# Patient Record
Sex: Female | Born: 1960
Health system: Southern US, Community
[De-identification: ages and names within clinical notes are randomized; demographics above are authoritative.]

## PROBLEM LIST (undated history)

## (undated) DIAGNOSIS — T7840XA Allergy, unspecified, initial encounter: Secondary | ICD-10-CM

## (undated) DIAGNOSIS — F419 Anxiety disorder, unspecified: Secondary | ICD-10-CM

## (undated) DIAGNOSIS — E119 Type 2 diabetes mellitus without complications: Secondary | ICD-10-CM

## (undated) DIAGNOSIS — K589 Irritable bowel syndrome without diarrhea: Secondary | ICD-10-CM

## (undated) DIAGNOSIS — R569 Unspecified convulsions: Secondary | ICD-10-CM

## (undated) DIAGNOSIS — G8929 Other chronic pain: Secondary | ICD-10-CM

## (undated) DIAGNOSIS — G4730 Sleep apnea, unspecified: Secondary | ICD-10-CM

## (undated) DIAGNOSIS — I10 Essential (primary) hypertension: Secondary | ICD-10-CM

## (undated) DIAGNOSIS — R519 Headache, unspecified: Secondary | ICD-10-CM

## (undated) DIAGNOSIS — K635 Polyp of colon: Secondary | ICD-10-CM

## (undated) DIAGNOSIS — K222 Esophageal obstruction: Secondary | ICD-10-CM

## (undated) DIAGNOSIS — E785 Hyperlipidemia, unspecified: Secondary | ICD-10-CM

## (undated) DIAGNOSIS — M1990 Unspecified osteoarthritis, unspecified site: Secondary | ICD-10-CM

## (undated) DIAGNOSIS — Q0700 Arnold-Chiari syndrome without spina bifida or hydrocephalus: Secondary | ICD-10-CM

## (undated) DIAGNOSIS — R51 Headache: Secondary | ICD-10-CM

## (undated) DIAGNOSIS — K5790 Diverticulosis of intestine, part unspecified, without perforation or abscess without bleeding: Secondary | ICD-10-CM

## (undated) DIAGNOSIS — F32A Depression, unspecified: Secondary | ICD-10-CM

## (undated) DIAGNOSIS — R7303 Prediabetes: Secondary | ICD-10-CM

## (undated) DIAGNOSIS — K219 Gastro-esophageal reflux disease without esophagitis: Secondary | ICD-10-CM

## (undated) DIAGNOSIS — F329 Major depressive disorder, single episode, unspecified: Secondary | ICD-10-CM

## (undated) HISTORY — DX: Hyperlipidemia, unspecified: E78.5

## (undated) HISTORY — DX: Unspecified convulsions: R56.9

## (undated) HISTORY — PX: POLYPECTOMY: SHX149

## (undated) HISTORY — DX: Allergy, unspecified, initial encounter: T78.40XA

## (undated) HISTORY — DX: Polyp of colon: K63.5

## (undated) HISTORY — DX: Prediabetes: R73.03

## (undated) HISTORY — PX: COLONOSCOPY: SHX174

## (undated) HISTORY — DX: Essential (primary) hypertension: I10

## (undated) HISTORY — DX: Unspecified osteoarthritis, unspecified site: M19.90

## (undated) HISTORY — DX: Arnold-Chiari syndrome without spina bifida or hydrocephalus: Q07.00

## (undated) HISTORY — DX: Headache, unspecified: R51.9

## (undated) HISTORY — PX: OTHER SURGICAL HISTORY: SHX169

## (undated) HISTORY — PX: TUBAL LIGATION: SHX77

## (undated) HISTORY — DX: Esophageal obstruction: K22.2

## (undated) HISTORY — PX: TOTAL ABDOMINAL HYSTERECTOMY: SHX209

## (undated) HISTORY — PX: BREAST BIOPSY: SHX20

## (undated) HISTORY — DX: Gastro-esophageal reflux disease without esophagitis: K21.9

## (undated) HISTORY — DX: Sleep apnea, unspecified: G47.30

## (undated) HISTORY — DX: Diverticulosis of intestine, part unspecified, without perforation or abscess without bleeding: K57.90

## (undated) HISTORY — PX: UPPER GASTROINTESTINAL ENDOSCOPY: SHX188

## (undated) HISTORY — DX: Anxiety disorder, unspecified: F41.9

## (undated) HISTORY — DX: Headache: R51

## (undated) HISTORY — DX: Irritable bowel syndrome, unspecified: K58.9

## (undated) HISTORY — DX: Other chronic pain: G89.29

## (undated) HISTORY — PX: CHOLECYSTECTOMY: SHX55

---

## 1999-03-10 ENCOUNTER — Encounter: Payer: Self-pay | Admitting: Neurosurgery

## 1999-03-12 ENCOUNTER — Inpatient Hospital Stay (HOSPITAL_COMMUNITY): Admission: RE | Admit: 1999-03-12 | Discharge: 1999-03-16 | Payer: Self-pay | Admitting: Neurosurgery

## 1999-03-21 ENCOUNTER — Encounter: Payer: Self-pay | Admitting: Neurosurgery

## 1999-03-21 ENCOUNTER — Inpatient Hospital Stay (HOSPITAL_COMMUNITY): Admission: EM | Admit: 1999-03-21 | Discharge: 1999-03-24 | Payer: Self-pay | Admitting: Emergency Medicine

## 1999-03-31 ENCOUNTER — Inpatient Hospital Stay (HOSPITAL_COMMUNITY): Admission: EM | Admit: 1999-03-31 | Discharge: 1999-04-09 | Payer: Self-pay | Admitting: Emergency Medicine

## 1999-04-14 ENCOUNTER — Encounter: Payer: Self-pay | Admitting: Neurosurgery

## 1999-04-14 ENCOUNTER — Ambulatory Visit (HOSPITAL_COMMUNITY): Admission: RE | Admit: 1999-04-14 | Discharge: 1999-04-14 | Payer: Self-pay | Admitting: Neurosurgery

## 1999-04-16 ENCOUNTER — Inpatient Hospital Stay (HOSPITAL_COMMUNITY): Admission: AD | Admit: 1999-04-16 | Discharge: 1999-04-21 | Payer: Self-pay | Admitting: Neurosurgery

## 1999-04-16 ENCOUNTER — Encounter: Payer: Self-pay | Admitting: Neurosurgery

## 1999-11-30 ENCOUNTER — Other Ambulatory Visit: Admission: RE | Admit: 1999-11-30 | Discharge: 1999-11-30 | Payer: Self-pay | Admitting: Obstetrics and Gynecology

## 2001-07-01 ENCOUNTER — Emergency Department (HOSPITAL_COMMUNITY): Admission: EM | Admit: 2001-07-01 | Discharge: 2001-07-01 | Payer: Self-pay | Admitting: Emergency Medicine

## 2001-07-01 ENCOUNTER — Encounter: Payer: Self-pay | Admitting: Emergency Medicine

## 2001-09-15 ENCOUNTER — Other Ambulatory Visit: Admission: RE | Admit: 2001-09-15 | Discharge: 2001-09-15 | Payer: Self-pay | Admitting: Obstetrics and Gynecology

## 2001-11-02 ENCOUNTER — Encounter: Payer: Self-pay | Admitting: Emergency Medicine

## 2001-11-02 ENCOUNTER — Emergency Department (HOSPITAL_COMMUNITY): Admission: EM | Admit: 2001-11-02 | Discharge: 2001-11-03 | Payer: Self-pay | Admitting: Emergency Medicine

## 2002-03-19 ENCOUNTER — Encounter: Payer: Self-pay | Admitting: Internal Medicine

## 2002-03-23 ENCOUNTER — Encounter: Payer: Self-pay | Admitting: Internal Medicine

## 2002-03-23 ENCOUNTER — Ambulatory Visit (HOSPITAL_COMMUNITY): Admission: RE | Admit: 2002-03-23 | Discharge: 2002-03-23 | Payer: Self-pay | Admitting: Internal Medicine

## 2002-04-23 ENCOUNTER — Ambulatory Visit (HOSPITAL_COMMUNITY): Admission: RE | Admit: 2002-04-23 | Discharge: 2002-04-23 | Payer: Self-pay | Admitting: Pulmonary Disease

## 2002-07-22 ENCOUNTER — Emergency Department (HOSPITAL_COMMUNITY): Admission: EM | Admit: 2002-07-22 | Discharge: 2002-07-22 | Payer: Self-pay | Admitting: Emergency Medicine

## 2002-08-13 ENCOUNTER — Encounter (INDEPENDENT_AMBULATORY_CARE_PROVIDER_SITE_OTHER): Payer: Self-pay | Admitting: *Deleted

## 2002-08-13 ENCOUNTER — Encounter: Payer: Self-pay | Admitting: Urology

## 2002-08-13 ENCOUNTER — Ambulatory Visit (HOSPITAL_COMMUNITY): Admission: RE | Admit: 2002-08-13 | Discharge: 2002-08-13 | Payer: Self-pay | Admitting: Urology

## 2002-11-15 ENCOUNTER — Other Ambulatory Visit: Admission: RE | Admit: 2002-11-15 | Discharge: 2002-11-15 | Payer: Self-pay | Admitting: Obstetrics and Gynecology

## 2003-01-02 ENCOUNTER — Encounter: Payer: Self-pay | Admitting: Neurology

## 2003-01-02 ENCOUNTER — Ambulatory Visit (HOSPITAL_COMMUNITY): Admission: RE | Admit: 2003-01-02 | Discharge: 2003-01-02 | Payer: Self-pay | Admitting: Neurology

## 2003-06-17 ENCOUNTER — Ambulatory Visit (HOSPITAL_COMMUNITY): Admission: RE | Admit: 2003-06-17 | Discharge: 2003-06-17 | Payer: Self-pay | Admitting: Pulmonary Disease

## 2003-11-18 ENCOUNTER — Other Ambulatory Visit: Admission: RE | Admit: 2003-11-18 | Discharge: 2003-11-18 | Payer: Self-pay | Admitting: Obstetrics and Gynecology

## 2004-01-10 ENCOUNTER — Emergency Department (HOSPITAL_COMMUNITY): Admission: EM | Admit: 2004-01-10 | Discharge: 2004-01-10 | Payer: Self-pay | Admitting: Emergency Medicine

## 2004-01-16 ENCOUNTER — Ambulatory Visit (HOSPITAL_COMMUNITY): Admission: RE | Admit: 2004-01-16 | Discharge: 2004-01-16 | Payer: Self-pay | Admitting: Neurology

## 2004-01-20 ENCOUNTER — Ambulatory Visit (HOSPITAL_COMMUNITY): Admission: RE | Admit: 2004-01-20 | Discharge: 2004-01-20 | Payer: Self-pay | Admitting: Neurology

## 2004-03-24 ENCOUNTER — Encounter: Admission: RE | Admit: 2004-03-24 | Discharge: 2004-03-24 | Payer: Self-pay | Admitting: Obstetrics and Gynecology

## 2004-08-18 ENCOUNTER — Ambulatory Visit (HOSPITAL_COMMUNITY): Admission: RE | Admit: 2004-08-18 | Discharge: 2004-08-18 | Payer: Self-pay | Admitting: Pulmonary Disease

## 2004-12-30 ENCOUNTER — Ambulatory Visit (HOSPITAL_COMMUNITY): Admission: RE | Admit: 2004-12-30 | Discharge: 2004-12-30 | Payer: Self-pay | Admitting: Neurology

## 2005-04-07 ENCOUNTER — Emergency Department (HOSPITAL_COMMUNITY): Admission: EM | Admit: 2005-04-07 | Discharge: 2005-04-07 | Payer: Self-pay | Admitting: Emergency Medicine

## 2005-05-06 ENCOUNTER — Ambulatory Visit: Payer: Self-pay | Admitting: Internal Medicine

## 2005-05-07 ENCOUNTER — Ambulatory Visit: Payer: Self-pay | Admitting: Internal Medicine

## 2005-06-04 ENCOUNTER — Emergency Department (HOSPITAL_COMMUNITY): Admission: EM | Admit: 2005-06-04 | Discharge: 2005-06-04 | Payer: Self-pay | Admitting: Emergency Medicine

## 2005-06-23 ENCOUNTER — Ambulatory Visit (HOSPITAL_COMMUNITY): Admission: RE | Admit: 2005-06-23 | Discharge: 2005-06-23 | Payer: Self-pay | Admitting: Pulmonary Disease

## 2005-08-02 ENCOUNTER — Ambulatory Visit (HOSPITAL_COMMUNITY): Admission: RE | Admit: 2005-08-02 | Discharge: 2005-08-02 | Payer: Self-pay | Admitting: Pulmonary Disease

## 2006-02-11 ENCOUNTER — Encounter: Admission: RE | Admit: 2006-02-11 | Discharge: 2006-02-11 | Payer: Self-pay | Admitting: Obstetrics and Gynecology

## 2006-03-22 ENCOUNTER — Ambulatory Visit (HOSPITAL_COMMUNITY): Admission: RE | Admit: 2006-03-22 | Discharge: 2006-03-22 | Payer: Self-pay | Admitting: Pulmonary Disease

## 2006-05-20 ENCOUNTER — Ambulatory Visit (HOSPITAL_COMMUNITY): Admission: RE | Admit: 2006-05-20 | Discharge: 2006-05-20 | Payer: Self-pay | Admitting: Pulmonary Disease

## 2006-07-01 ENCOUNTER — Ambulatory Visit (HOSPITAL_COMMUNITY): Admission: RE | Admit: 2006-07-01 | Discharge: 2006-07-01 | Payer: Self-pay | Admitting: Pulmonary Disease

## 2006-09-22 ENCOUNTER — Emergency Department (HOSPITAL_COMMUNITY): Admission: EM | Admit: 2006-09-22 | Discharge: 2006-09-22 | Payer: Self-pay | Admitting: Emergency Medicine

## 2007-02-21 ENCOUNTER — Encounter: Admission: RE | Admit: 2007-02-21 | Discharge: 2007-02-21 | Payer: Self-pay | Admitting: Obstetrics and Gynecology

## 2007-11-20 ENCOUNTER — Ambulatory Visit: Payer: Self-pay | Admitting: Internal Medicine

## 2007-12-31 ENCOUNTER — Emergency Department (HOSPITAL_COMMUNITY): Admission: EM | Admit: 2007-12-31 | Discharge: 2007-12-31 | Payer: Self-pay | Admitting: Emergency Medicine

## 2008-04-02 ENCOUNTER — Encounter: Admission: RE | Admit: 2008-04-02 | Discharge: 2008-04-02 | Payer: Self-pay | Admitting: Obstetrics and Gynecology

## 2008-04-25 ENCOUNTER — Encounter: Payer: Self-pay | Admitting: Neurology

## 2008-04-25 ENCOUNTER — Ambulatory Visit (HOSPITAL_COMMUNITY): Admission: RE | Admit: 2008-04-25 | Discharge: 2008-04-25 | Payer: Self-pay | Admitting: Neurology

## 2009-02-11 HISTORY — PX: OTHER SURGICAL HISTORY: SHX169

## 2009-04-03 ENCOUNTER — Ambulatory Visit: Admission: RE | Admit: 2009-04-03 | Discharge: 2009-04-03 | Payer: Self-pay | Admitting: Neurology

## 2009-04-14 ENCOUNTER — Telehealth: Payer: Self-pay | Admitting: Internal Medicine

## 2009-05-05 ENCOUNTER — Ambulatory Visit: Payer: Self-pay | Admitting: Internal Medicine

## 2009-05-05 DIAGNOSIS — R1011 Right upper quadrant pain: Secondary | ICD-10-CM | POA: Insufficient documentation

## 2009-05-05 DIAGNOSIS — R197 Diarrhea, unspecified: Secondary | ICD-10-CM | POA: Insufficient documentation

## 2009-05-05 LAB — CONVERTED CEMR LAB
ALT: 44 units/L — ABNORMAL HIGH (ref 0–35)
Amylase: 45 units/L (ref 27–131)
BUN: 13 mg/dL (ref 6–23)
Basophils Absolute: 0 10*3/uL (ref 0.0–0.1)
Basophils Relative: 0.6 % (ref 0.0–3.0)
Bilirubin, Direct: 0.1 mg/dL (ref 0.0–0.3)
CO2: 27 meq/L (ref 19–32)
Calcium: 9.1 mg/dL (ref 8.4–10.5)
Creatinine, Ser: 0.9 mg/dL (ref 0.4–1.2)
Eosinophils Absolute: 0.2 10*3/uL (ref 0.0–0.7)
HCT: 40.8 % (ref 36.0–46.0)
Hemoglobin, Urine: NEGATIVE
Hemoglobin: 14.3 g/dL (ref 12.0–15.0)
Ketones, ur: NEGATIVE mg/dL
Leukocytes, UA: NEGATIVE
Lipase: 16 units/L (ref 11.0–59.0)
Lymphs Abs: 1.9 10*3/uL (ref 0.7–4.0)
MCHC: 35 g/dL (ref 30.0–36.0)
Neutro Abs: 3.5 10*3/uL (ref 1.4–7.7)
RBC: 4.48 M/uL (ref 3.87–5.11)
RDW: 12.3 % (ref 11.5–14.6)
Specific Gravity, Urine: 1.01 (ref 1.000–1.030)
TSH: 0.76 microintl units/mL (ref 0.35–5.50)
Total Bilirubin: 0.6 mg/dL (ref 0.3–1.2)
Total Protein: 7.6 g/dL (ref 6.0–8.3)
Urine Glucose: NEGATIVE mg/dL
Urobilinogen, UA: 0.2 (ref 0.0–1.0)

## 2009-05-07 ENCOUNTER — Encounter: Payer: Self-pay | Admitting: Internal Medicine

## 2009-05-15 ENCOUNTER — Ambulatory Visit: Payer: Self-pay | Admitting: Internal Medicine

## 2009-07-31 ENCOUNTER — Encounter: Admission: RE | Admit: 2009-07-31 | Discharge: 2009-07-31 | Payer: Self-pay | Admitting: Obstetrics and Gynecology

## 2009-08-13 HISTORY — PX: SHUNT REPLACEMENT: SHX5403

## 2009-09-23 ENCOUNTER — Emergency Department (HOSPITAL_COMMUNITY): Admission: EM | Admit: 2009-09-23 | Discharge: 2009-09-23 | Payer: Self-pay | Admitting: Emergency Medicine

## 2010-02-23 ENCOUNTER — Ambulatory Visit (HOSPITAL_BASED_OUTPATIENT_CLINIC_OR_DEPARTMENT_OTHER): Admission: RE | Admit: 2010-02-23 | Discharge: 2010-02-23 | Payer: Self-pay | Admitting: Neurology

## 2010-02-27 ENCOUNTER — Ambulatory Visit (HOSPITAL_COMMUNITY): Admission: RE | Admit: 2010-02-27 | Discharge: 2010-02-27 | Payer: Self-pay | Admitting: Pulmonary Disease

## 2010-02-27 ENCOUNTER — Encounter: Payer: Self-pay | Admitting: Orthopedic Surgery

## 2010-03-31 ENCOUNTER — Encounter: Payer: Self-pay | Admitting: Orthopedic Surgery

## 2010-04-01 ENCOUNTER — Ambulatory Visit: Payer: Self-pay | Admitting: Orthopedic Surgery

## 2010-04-01 DIAGNOSIS — G4730 Sleep apnea, unspecified: Secondary | ICD-10-CM

## 2010-04-01 DIAGNOSIS — M7500 Adhesive capsulitis of unspecified shoulder: Secondary | ICD-10-CM

## 2010-04-01 HISTORY — DX: Adhesive capsulitis of unspecified shoulder: M75.00

## 2010-04-13 ENCOUNTER — Encounter (HOSPITAL_COMMUNITY): Admission: RE | Admit: 2010-04-13 | Discharge: 2010-05-13 | Payer: Self-pay | Admitting: Orthopedic Surgery

## 2010-05-04 ENCOUNTER — Encounter: Payer: Self-pay | Admitting: Orthopedic Surgery

## 2010-05-13 ENCOUNTER — Ambulatory Visit: Payer: Self-pay | Admitting: Orthopedic Surgery

## 2010-05-15 ENCOUNTER — Encounter (HOSPITAL_COMMUNITY): Admission: RE | Admit: 2010-05-15 | Discharge: 2010-06-12 | Payer: Self-pay | Admitting: Orthopedic Surgery

## 2010-06-10 ENCOUNTER — Encounter: Payer: Self-pay | Admitting: Orthopedic Surgery

## 2010-06-15 ENCOUNTER — Encounter (HOSPITAL_COMMUNITY)
Admission: RE | Admit: 2010-06-15 | Discharge: 2010-06-26 | Payer: Self-pay | Source: Home / Self Care | Admitting: Orthopedic Surgery

## 2010-06-24 ENCOUNTER — Ambulatory Visit: Payer: Self-pay | Admitting: Orthopedic Surgery

## 2010-06-26 ENCOUNTER — Encounter: Payer: Self-pay | Admitting: Orthopedic Surgery

## 2010-06-26 ENCOUNTER — Encounter (HOSPITAL_COMMUNITY)
Admission: RE | Admit: 2010-06-26 | Discharge: 2010-07-26 | Payer: Self-pay | Source: Home / Self Care | Admitting: Orthopedic Surgery

## 2010-07-02 ENCOUNTER — Encounter: Payer: Self-pay | Admitting: Orthopedic Surgery

## 2010-07-28 ENCOUNTER — Encounter (HOSPITAL_COMMUNITY)
Admission: RE | Admit: 2010-07-28 | Discharge: 2010-08-27 | Payer: Self-pay | Source: Home / Self Care | Attending: Orthopedic Surgery | Admitting: Orthopedic Surgery

## 2010-07-31 ENCOUNTER — Ambulatory Visit (HOSPITAL_COMMUNITY)
Admission: RE | Admit: 2010-07-31 | Discharge: 2010-07-31 | Payer: Self-pay | Source: Home / Self Care | Admitting: Pulmonary Disease

## 2010-08-11 ENCOUNTER — Ambulatory Visit: Payer: Self-pay | Admitting: Orthopedic Surgery

## 2010-09-23 ENCOUNTER — Ambulatory Visit (HOSPITAL_COMMUNITY)
Admission: RE | Admit: 2010-09-23 | Discharge: 2010-09-23 | Payer: Self-pay | Source: Home / Self Care | Attending: Pulmonary Disease | Admitting: Pulmonary Disease

## 2010-10-03 ENCOUNTER — Encounter: Payer: Self-pay | Admitting: Pulmonary Disease

## 2010-10-05 ENCOUNTER — Encounter: Payer: Self-pay | Admitting: Obstetrics and Gynecology

## 2010-10-13 NOTE — Assessment & Plan Note (Signed)
Summary: 6 wk RE-CHECK LT SHOULDER FOL'G PT/BCBS/CAF   Visit Type:  Follow-up Referring Provider:  Dr. Juanetta Gosling Primary Provider:  [name], MD  CC:  recheck left shoulder.  History of Present Illness: I saw Amanda Webster in the office today for an initial visit.  She is a 50 years old woman with the complaint of:  left shoulder pain.  This patient has a history of Arnold-Chiari malformation status post surgical treatment with complications including hydrocephalus requiring shunting placement presents with sudden onset of constant dull stabbing burning and moderately severe LEFT shoulder pain associated with loss of motion which started about 2-3 months ago.  Her symptoms worsened with forward elevation and it's better she keeps it close to her side.  The pain seems to run to the elbow and not below although she has carpal tunnel  Xrays at [facility] on 02-27-10. No acute abnormalities. acromion type II  Today is recheck left shoulder after injection and PT.  Is doing better, still doing therapy.  Norco 5 for pain, helps.  Review of systems reveals no neurologic deficits in terms of symptoms.  Pain is improved    Allergies: 1)  ! Darvocet  Physical Exam  Additional Exam:  my exam shows that she has 90 of abduction 120 of passive forward elevation 30 of external rotation.  She has no sensory deficits she is awake and alert her skin to intact motor normal pulse is good  Appearance normal     Impression & Recommendations:  Problem # 1:  FROZEN LEFT SHOULDER (ICD-726.0) Assessment Improved  Orders: Est. Patient Level III (81191)  Patient Instructions: 1)  Continue therapy 2)  Take pain med as needed 3)  Come back in 6 weeks

## 2010-10-13 NOTE — Letter (Signed)
Summary: History form  History form   Imported By: Jacklynn Ganong 04/03/2010 07:55:24  _____________________________________________________________________  External Attachment:    Type:   Image     Comment:   External Document

## 2010-10-13 NOTE — Miscellaneous (Signed)
Summary: OT clinical evaluation  OT clinical evaluation   Imported By: Jacklynn Ganong 05/04/2010 13:29:10  _____________________________________________________________________  External Attachment:    Type:   Image     Comment:   External Document

## 2010-10-13 NOTE — Assessment & Plan Note (Signed)
Summary: 6 WK RE-CK LT SHOULDER/BCBS/CAF   Visit Type:  Follow-up Referring Provider:  Dr. Juanetta Gosling Primary Provider:  [name], MD  CC:  recheck left shoulder.  History of Present Illness: 50 year old female treated for frozen shoulder with physical therapy  Norco 5 for pain, has not taking any in a while, takes only as needed.  Today is 6 week recheck after HEP.  Still some soreness.  She has been moderately compliant with her exercise program and says that she has functional range of motion and she can do most things.  Exam shows that she has 125 of forward elevation active 150 passive she has normal scapulothoracic rhythm.  She has normal abduction.  No weakness.  Her external rotation is about 35.  Impression improved, she is reached maximal improvement      Allergies: 1)  ! Darvocet   Impression & Recommendations:  Problem # 1:  FROZEN LEFT SHOULDER (ICD-726.0) Assessment Improved  Orders: Est. Patient Level II (66440)  Patient Instructions: 1)  MAINTAIN EXERCIS PROGRAM FOLLOW AS NEEDED    Orders Added: 1)  Est. Patient Level II [34742]

## 2010-10-13 NOTE — Assessment & Plan Note (Signed)
Summary: 6 WK RE-CK LT SHOULDER/BCBS/CAF   Visit Type:  Follow-up Referring Provider:  Dr. Juanetta Gosling Primary Provider:  [name], MD  CC:  recheck left shoulder.  History of Present Illness: 50 year old female treated for frozen shoulder with physical therapy  Currently taking Norco 5 mg for pain she is almost completed her physical therapy sessions making excellent improvement  Her external rotation with her arm at her side was 45 her forward elevation was 150 and her abduction was 90.  She should continue to do a home exercise program I will see her one additional time in about 6 weeks.      Allergies: 1)  ! Darvocet   Impression & Recommendations:  Problem # 1:  FROZEN LEFT SHOULDER (ICD-726.0) Assessment Improved  Orders: Est. Patient Level II (45409)  Medications Added to Medication List This Visit: 1)  Norco 5-325 Mg Tabs (Hydrocodone-acetaminophen) .Marland Kitchen.. 1 by mouth q 4 hrs as needed pain  Patient Instructions: 1)  complete therapy  2)  then do exercises at home  3)  return in 6 weeks  Prescriptions: NORCO 5-325 MG TABS (HYDROCODONE-ACETAMINOPHEN) 1 by mouth q 4 hrs as needed pain  #42 x 2   Entered and Authorized by:   Fuller Canada MD   Signed by:   Fuller Canada MD on 06/24/2010   Method used:   Print then Give to Patient   RxID:   [id number]

## 2010-10-13 NOTE — Letter (Signed)
Summary: *Orthopedic Consult Note  [facility]  [address]  [place], [place] [address]   Phone: [phone]  Fax: [phone]    Re:    GERARDA CONKLIN Azam DOB:    Aug 19, 1961   Dear: Renae Fickle   Thank you for requesting that we see the above patient for consultation.  A copy of the detailed office note will be sent under separate cover, for your review.  Evaluation today is consistent with:  1)  FROZEN LEFT SHOULDER (ICD-726.0)  Our recommendation is for: physical therapy, analgesics for pain Norco 5 mg, subacromial injection followup in 6-8 week       Thank you for this opportunity to look after your patient.  Sincerely,   Terrance Mass. MD.

## 2010-10-13 NOTE — Letter (Signed)
Summary: Referral notes from Dr. Gerilyn Pilgrim  Referral notes from Dr. Gerilyn Pilgrim   Imported By: Jacklynn Ganong 04/03/2010 07:56:20  _____________________________________________________________________  External Attachment:    Type:   Image     Comment:   External Document

## 2010-10-13 NOTE — Miscellaneous (Signed)
Summary: OT Progress note  OT Progress note   Imported By: Jacklynn Ganong 05/22/2010 09:28:11  _____________________________________________________________________  External Attachment:    Type:   Image     Comment:   External Document

## 2010-10-13 NOTE — Miscellaneous (Signed)
Summary: OT progress note  OT progress note   Imported By: Jacklynn Ganong 06/23/2010 16:09:46  _____________________________________________________________________  External Attachment:    Type:   Image     Comment:   External Document

## 2010-10-13 NOTE — Miscellaneous (Signed)
Summary: OT progress note  OT progress note   Imported By: Jacklynn Ganong 06/26/2010 07:54:21  _____________________________________________________________________  External Attachment:    Type:   Image     Comment:   External Document

## 2010-10-13 NOTE — Assessment & Plan Note (Signed)
Summary: left [facility] pain [facility]r [facility]27/10/medicare/bcbs/hawkins/bsf   Visit Type:  Initial Consult Referring Provider:  Dr. Juanetta Gosling Primary Provider:  [name], MD  CC:  left shoulder pain.  History of Present Illness: I saw Amanda Webster in the office today for an initial visit.  She is a 50 years old woman with the complaint of:  left shoulder pain.  This patient has a history of Arnold-Chiari malformation status post surgical treatment with complications including hydrocephalus requiring shunting placement presents with sudden onset of constant dull stabbing burning and moderately severe LEFT shoulder pain associated with loss of motion which started about 2-3 months ago.  Her symptoms worsened with forward elevation and it's better she keeps it close to her side.  The pain seems to run to the elbow and not below although she has carpal tunnel  Xrays at [facility] on 02-27-10. No acute abnormalities. acromion type II   Allergies: 1)  ! Darvocet  Past History:  Past Medical History: Arthritis Chronic Headaches GERD Hyperlipidemia Hypertension Irritable Bowel Syndrome Diverticulosis Colon Polyp-Nonadenomatous Esophageal Stricture Sleep Apnea Borderline diabetic  Past Surgical History: Cholecystectomy Tubal Ligation Total Hysterectomy Left Breast-Lumpectomy Shunt put in on right side of brain Chari decompression 02/1999 Chari decompression 03/1999 Invasive cervical traction 02/2009 Shunt replacement 08/2009  Review of Systems Constitutional:  Complains of fatigue; denies weight loss, weight gain, fever, and chills. Cardiovascular:  Denies chest pain, palpitations, fainting, and murmurs. Respiratory:  Complains of short of breath; denies wheezing, couch, tightness, pain on inspiration, and snoring . Gastrointestinal:  Complains of nausea and diarrhea; denies heartburn, vomiting, constipation, and blood in your stools. Genitourinary:  Complains of urgency;  denies frequency, difficulty urinating, painful urination, flank pain, and bleeding in urine. Neurologic:  Complains of numbness, tingling, unsteady gait, and dizziness; denies tremors and seizure. Musculoskeletal:  Complains of joint pain, instability, stiffness, and muscle pain; denies swelling, redness, and heat. Endocrine:  Complains of excessive thirst; denies exessive urination and heat or cold intolerance. Psychiatric:  Denies nervousness, depression, anxiety, and hallucinations. Skin:  Denies changes in the skin, poor healing, rash, itching, and redness. HEENT:  Denies blurred or double vision, eye pain, redness, and watering. Immunology:  Denies seasonal allergies, sinus problems, and allergic to bee stings. Hemoatologic:  Denies easy bleeding and brusing.  Physical Exam  Additional Exam:   * VS reviewed and were normal   *GEN: appearance was normal   ** CDV: normal pulses temperature and no edema  * LYMPH nodes were normal   * SKIN was normal   * Neuro: normal sensation ** Psyche: AAO x 3 and mood was normal   MSK *Gait was normal  *Inspection tenderness over the anterolateral acromion posterior subacromial space  Active range of motion LEFT shoulder abduction 60 for elevation 45 passive range of motion the same external rotation limited to 30 active and passive  Motor exam elbow wrist and hand normal shoulder stable.   neck exam shows tenderness at the base of the cervical spine somewhat in the LEFT wrapped.     Impression & Recommendations:  Problem # 1:  FROZEN LEFT SHOULDER (ICD-726.0) Assessment New  The x-rays were done at [facility]. The report and the films have been reviewed.    Verbal consent obtained/The shoulder was injected with depomedrol 40mg /cc and sensorcaine .25% . There were no complications  Orders: Consultation Level III (78469) Joint Aspirate / Injection, Large (20610) Depo- Medrol 40mg  (J1030) Also notes from Dr. Germain Osgood which show that  she does have carpal tunnel syndrome bilaterally she had an EMG which showed LEFT median neuropathy at the wrist and bilateral mild ulnar neuropathy at the elbow.  Repeat study was done at October 2007 which showed a C6-C7 radiculopathy on the RIGHT and then bilateral median and ulnar neuropathy wrist and elbow respectively  Patient Instructions: 1)  6 weeks follow up  2)  You have received an injection of cortisone today. You may experience increased pain at the injection site. Apply ice pack to the area for 20 minutes every 2 hours and take 2 xtra strength tylenol every 8 hours. This increased pain will usually resolve in 24 hours. The injection will take effect in 3-10 days.  3)  Physical therapy  4)  YOU HAVE FROZEN SHOULDER

## 2010-10-13 NOTE — Miscellaneous (Signed)
Summary: physical therapy order  physical therapy order   Imported By: Cammie Sickle 04/01/2010 18:17:29  _____________________________________________________________________  External Attachment:    Type:   Image     Comment:   External Document

## 2010-10-13 NOTE — Miscellaneous (Signed)
Summary: OT progress note  OT progress note   Imported By: Jacklynn Ganong 07/02/2010 16:43:22  _____________________________________________________________________  External Attachment:    Type:   Image     Comment:   External Document

## 2010-11-29 LAB — BASIC METABOLIC PANEL
BUN: 13 mg/dL (ref 6–23)
CO2: 29 mEq/L (ref 19–32)
Calcium: 9.2 mg/dL (ref 8.4–10.5)
Creatinine, Ser: 0.75 mg/dL (ref 0.4–1.2)
GFR calc non Af Amer: >60 mL/min (ref >60)
Glucose, Bld: 164 mg/dL — ABNORMAL HIGH (ref 70–99)
Sodium: 138 mEq/L (ref 135–145)

## 2010-11-29 LAB — CBC
Hemoglobin: 13.6 g/dL (ref 12.0–15.0)
MCHC: 33.7 g/dL (ref 30.0–36.0)
Platelets: 164 10*3/uL (ref 150–400)
RDW: 12.9 % (ref 11.5–15.5)

## 2011-01-26 NOTE — Assessment & Plan Note (Signed)
[facility]                         GASTROENTEROLOGY OFFICE NOTE   NAJWA, SPILLANE                    MRN:          [id number]  DATE:11/20/2007                            DOB:          [birth year]    HISTORY:  Amanda Webster presents today with recurrent right upper quadrant  pain.  She is a 50 year old with a history of reflux disease, irritable  bowel syndrome, prior cholecystectomy with negative endoscopic  retrograde cholangiopancreatography, ventriculoperitoneal shunt  placement for cerebrospinal fluid leak post-neurosurgical repair of  Chiari malformation.  She has been evaluated in this office on a number  of occasions over the years, for different GI complaints.  She was last  evaluated on May 06, 2005, for right upper quadrant pain.  She  presents today for the same pain.  She describes symptoms intermittently  over the past several months.  They have worsened in the past month.  She describes intermittent problems with sharp pain under the right rib  cage, which lasts for a few seconds or minutes.  When the pain occurs it  is relieved with bending in the fetal position.  It is worse with deep  breaths or certain movements.  There is no effect with meals.  She does  feel that the discomfort is significantly helped with an antispasmodic  such as Levsin.  She is out of sublingual Levsin and requests a refill.  She denies nausea or vomiting.  She has been having some breakthrough  reflux.  She is on no proton pump inhibitor, due to cost.  She denies  dysphagia.  No problems with her bowel habits.  She has had prior  colonoscopy in 2003, which was essentially normal.   CURRENT MEDICATIONS:  1. Paxil.  2. Cozaar.  3. Premarin.  4. Lasix.  5. Flexeril.  6. An unspecified anti-inflammatory.   PHYSICAL EXAMINATION:  GENERAL:  A well-appearing female, in no acute  distress.  VITAL SIGNS:  Blood pressure 116/66, heart rate 80 and regular,  weight  204 pounds.  HEENT:  Sclerae anicteric.  Conjunctivae pink.  Oral mucosa intact.  No  adenopathy.  LUNGS:  Clear.  HEART:  Regular.  ABDOMEN:  Soft, without tenderness, mass or hernia.  Good bowel sounds  heard.   IMPRESSION:  Recurrence of fleeting right upper quadrant pain, again  felt to be most consistent with either rib catch syndrome, versus  transient colonic spasm.  Previous evaluation for the same included  abdominal ultrasound and liver function tests, which were unrevealing.   RECOMMENDATIONS:  1. Refill Levsin sublingual one to two q.4h. p.r.n. pain.  2. Resume general medical care with Dr. Ramon Dredge L. Hawkins.  3. GI followup as needed.     Wilhemina Bonito. Marina Goodell, MD  Electronically Signed    JNP/MedQ  DD: 11/20/2007  DT: 11/21/2007  Job #: [id number]   cc:   Ramon Dredge L. Juanetta Gosling, M.D.

## 2011-01-29 NOTE — Procedures (Signed)
NAME:  JASARA, CORRIGAN             ACCOUNT NO.:  [id number]   MEDICAL RECORD NO.:  [id number]          PATIENT TYPE:  OUT   LOCATION:  RESP                          FACILITY:  [facility]   PHYSICIAN:  Edward L. Juanetta Gosling, M.D.DATE OF BIRTH:  02-07-1961   DATE OF PROCEDURE:  DATE OF DISCHARGE:                              PULMONARY FUNCTION TEST   FINDINGS:  1.  Values from spirometry are normal, but the flow-volume loop is very      circular in appearance and could indicate some sort of extrathoracic      flow obstruction.  Clinical correlation is suggested.  2.  Lung volumes show mild decrease in total lung capacity.  3.  ELCO is moderately reduced.  4.  Arterial blood gasses are normal.     Edwa   ELH/MEDQ  D:  08/18/2004  T:  08/18/2004  Job:  [id number]

## 2011-01-29 NOTE — Procedures (Signed)
NAME:  Amanda Webster, Amanda Webster             ACCOUNT NO.:  [id number]   MEDICAL RECORD NO.:  [id number]          PATIENT TYPE:  OUT   LOCATION:  SLEE                          FACILITY:  [facility]   PHYSICIAN:  Kofi A. Gerilyn Pilgrim, M.D. DATE OF BIRTH:  [birth year]   DATE OF PROCEDURE:  04/12/2009  DATE OF DISCHARGE:  04/03/2009                             SLEEP DISORDER REPORT   REFERRING PHYSICIAN:  Kofi A. Gerilyn Pilgrim, MD   RECORDING DATE:  April 03, 2009.   INDICATIONS:  This is a obese lady, who presents with hypersomnia and  snoring.  She is being evaluated for a possible obstructive sleep apnea  syndrome.   MEDICATIONS:  Paxil, Neurontin, Soma, acetazolamide, potassium, Lasix,  estradiol, Valium.   EPWORTH SLEEPINESS SCALE:  1. BMI 35.   ARCHITECTURAL SUMMARY:  This is a full-night recording.  Diagnostic  study conducted for 400 minutes.  Sleep efficiency 90%.  Sleep latency 5  minutes.  REM latency 71 minutes.  Stage N1 1.7%, N2 33.6%, N3 34.7%,  and REM sleep 30%.   RESPIRATORY SUMMARY:  Baseline oxygen saturation 98.  Lowest saturation  82.  AHI is 1.8.   LIMB MOVEMENT SUMMARY:  PLM index 0.   ELECTROCARDIOGRAM SUMMARY:  Average heart rate is 58 with no obvious  dysrhythmias observed.   IMPRESSION:  Somewhat abnormal sleep architecture showing increased slow  wave sleep and increased REM sleep. This combination along with a early  sleep latency suggest sleep deprivation. Otherwise unremarkable  nocturnal polysomnography.      Kofi A. Gerilyn Pilgrim, M.D.  Electronically Signed     KAD/MEDQ  D:  04/12/2009  T:  04/13/2009  Job:  [id number]

## 2011-01-29 NOTE — Procedures (Signed)
NAMEJALANI, CULLIFER             ACCOUNT NO.:  [id number]   MEDICAL RECORD NO.:  [id number]          PATIENT TYPE:  OUT   LOCATION:  RESP                          FACILITY:  [facility]   PHYSICIAN:  Edward L. Juanetta Gosling, M.D.DATE OF BIRTH:  1961/07/02   DATE OF PROCEDURE:  07/01/2006  DATE OF DISCHARGE:  07/01/2006                              PULMONARY FUNCTION TEST   1. Spirometry is normal.  2. Lung volumes show mild reduction in total lung capacity based on the      patient's height and weight. This may be related to body habitus.  3. DLCO is mildly reduced.  4. Arterial blood gases are normal.   This is essentially unchanged from a tracing of August 18, 2004.      Edward L. Juanetta Gosling, M.D.  Electronically Signed     ELH/MEDQ  D:  07/04/2006  T:  07/04/2006  Job:  [id number]

## 2011-02-17 ENCOUNTER — Encounter: Payer: Self-pay | Admitting: Cardiology

## 2011-02-23 ENCOUNTER — Encounter: Payer: Self-pay | Admitting: Cardiology

## 2011-03-08 ENCOUNTER — Ambulatory Visit (INDEPENDENT_AMBULATORY_CARE_PROVIDER_SITE_OTHER): Payer: Medicare Other | Admitting: Cardiology

## 2011-03-08 ENCOUNTER — Encounter: Payer: Self-pay | Admitting: Cardiology

## 2011-03-08 VITALS — BP 118/71 | HR 60 | Ht 64.0 in | Wt 196.0 lb

## 2011-03-08 DIAGNOSIS — R1011 Right upper quadrant pain: Secondary | ICD-10-CM

## 2011-03-08 DIAGNOSIS — R079 Chest pain, unspecified: Secondary | ICD-10-CM | POA: Insufficient documentation

## 2011-03-08 DIAGNOSIS — E669 Obesity, unspecified: Secondary | ICD-10-CM | POA: Insufficient documentation

## 2011-03-08 NOTE — Patient Instructions (Signed)
**Note De-Identified Tekla Malachowski Obfuscation** Your physician recommends that you continue on your current medications as directed. Please refer to the Current Medication list given to you today.  Your physician encouraged you to lose weight for better health. Please see handouts given to you at today's visit.  Your physician recommends that you schedule a follow-up appointment in: as needed

## 2011-03-08 NOTE — Progress Notes (Signed)
HPI Amanda Webster is referred today by Dr. Juanetta Gosling for her chest discomfort.  She is a 49 year old married white female who's been having intermittent chest pressure for quite some time. She can't even remember when it began.  It happened spontaneously and usually at rest. It does not radiate. It is not related to exertion. There no other symptoms such as shortness of breath, diaphoresis or palpitations.  She has chronic headaches and also problems with hypersomnia.  Her major cardiac risk factor at this point is obesity. She is not diabetic but has been told she is borderline. Last blood sugar that I have showed a fasting sugar of 166. She is clearly diabetic. Her she does not smoke. There is a family history of some coronary disease and diabetes.  From a cardiogram today is normal with poor R-wave progression in V1 through V3 most likely secondary to body habitus. Past Medical History  Diagnosis Date  . Arthritis   . Chronic headaches   . GERD (gastroesophageal reflux disease)   . Hyperlipidemia   . HTN (hypertension)   . IBS (irritable bowel syndrome)   . Diverticulosis   . Colon polyps     nonadenomatous  . Esophageal stricture   . Sleep apnea   . Borderline diabetic     Past Surgical History  Procedure Date  . Cholecystectomy   . Tubal ligation   . Total abdominal hysterectomy   . Left breast-lumpectomy   . Shunt put in on right side of brain   . Cahri decompression 6 and 03/1999  . Invasive cervical traction 02/2009  . Shunt replacement 12/10    Family History  Problem Relation Age of Onset  . Colon polyps Mother   . Colon cancer Neg Hx     History   Social History  . Marital Status: Married    Spouse Name: N/A    Number of Children: N/A  . Years of Education: N/A   Occupational History  . Not on file.   Social History Main Topics  . Smoking status: Former Games developer  . Smokeless tobacco: Never Used  . Alcohol Use: No  . Drug Use: No  . Sexually Active: Not  on file   Other Topics Concern  . Not on file   Social History Narrative   Disabled. Drinks 4-5 cups of tea daily. Does not get regular exercise.     Allergies  Allergen Reactions  . Propoxyphene N-Acetaminophen     Current Outpatient Prescriptions  Medication Sig Dispense Refill  . acetaZOLAMIDE (DIAMOX) 250 MG tablet Take 1,000 mg by mouth daily.        . diazepam (VALIUM) 5 MG tablet Take 5 mg by mouth every 6 (six) hours as needed.        . gabapentin (NEURONTIN) 300 MG capsule Take 300 mg by mouth 3 (three) times daily.        . hyoscyamine (HYOMAX-SL) 0.125 MG SL tablet Place 0.25 mg under the tongue every 4 (four) hours as needed.        . midodrine (PROAMATINE) 5 MG tablet Take 5 mg by mouth 3 (three) times daily.       . potassium chloride SA (K-DUR,KLOR-CON) 20 MEQ tablet Take 20 mEq by mouth daily.       Marland Kitchen DISCONTD: carisoprodol (SOMA) 250 MG tablet Take 750 mg by mouth daily.        Marland Kitchen DISCONTD: estradiol (ESTRACE) 1 MG tablet Take 1 mg by mouth daily.        Marland Kitchen  DISCONTD: furosemide (LASIX) 40 MG tablet Take 40 mg by mouth daily.        Marland Kitchen DISCONTD: HYDROcodone-acetaminophen (NORCO) 5-325 MG per tablet Take 1 tablet by mouth every 4 (four) hours as needed.        Marland Kitchen DISCONTD: metroNIDAZOLE (FLAGYL) 250 MG tablet Take 250 mg by mouth 4 (four) times daily.        Marland Kitchen DISCONTD: PARoxetine (PAXIL-CR) 25 MG 24 hr tablet Take 25 mg by mouth daily.        Marland Kitchen DISCONTD: potassium chloride (KLOR-CON) 20 MEQ packet Take 20 mEq by mouth 2 (two) times daily.        Marland Kitchen DISCONTD: Probiotic Product (ALIGN) 4 MG CAPS Take 1 capsule by mouth daily. Dosage not specified         ROS Negative other than HPI.   PE General Appearance: well developed, well nourished in no acute distress, obese HEENT: symmetrical face, PERRLA, good dentition  Neck: no JVD, thyromegaly, or adenopathy, trachea midline Chest: symmetric without deformity Cardiac: PMI non-displaced, RRR, normal S1, S2, no gallop or  murmur Lung: clear to ausculation and percussion Vascular: all pulses full without bruits  Abdominal: nondistended, nontender, good bowel sounds, no HSM, no bruits Extremities: no cyanosis, clubbing or edema, no sign of DVT, no varicosities  Skin: normal color, no rashes Neuro: alert and oriented x 3, non-focal Pysch: Depressed Filed Vitals:   03/08/11 1004  BP: 118/71  Pulse: 60  Height: 5\' 4"  (1.626 m)  Weight: 196 lb (88.905 kg)  SpO2: 98%    EKG  Labs and Studies Reviewed.   Lab Results  Component Value Date   WBC 5.5 09/23/2009   HGB 13.6 09/23/2009   HCT 40.2 09/23/2009   MCV 92.4 09/23/2009   PLT 164 09/23/2009      Chemistry      Component Value Date/Time   NA 138 09/23/2009 1243   K 4.0 09/23/2009 1243   CL 101 09/23/2009 1243   CO2 29 09/23/2009 1243   BUN 13 09/23/2009 1243   CREATININE 0.75 09/23/2009 1243      Component Value Date/Time   CALCIUM 9.2 09/23/2009 1243   ALKPHOS 90 05/05/2009 1513   AST 51* 05/05/2009 1513   ALT 44* 05/05/2009 1513   BILITOT 0.6 05/05/2009 1513       No results found for this basename: CHOL   No results found for this basename: HDL   No results found for this basename: LDLCALC   No results found for this basename: TRIG   No results found for this basename: CHOLHDL   No results found for this basename: HGBA1C   Lab Results  Component Value Date   ALT 44* 05/05/2009   AST 51* 05/05/2009   ALKPHOS 90 05/05/2009   BILITOT 0.6 05/05/2009   Lab Results  Component Value Date   TSH 0.76 05/05/2009

## 2011-03-12 ENCOUNTER — Encounter: Payer: Self-pay | Admitting: Cardiology

## 2011-04-05 ENCOUNTER — Other Ambulatory Visit (HOSPITAL_COMMUNITY): Payer: Self-pay | Admitting: Pulmonary Disease

## 2011-04-05 ENCOUNTER — Ambulatory Visit (HOSPITAL_COMMUNITY)
Admission: RE | Admit: 2011-04-05 | Discharge: 2011-04-05 | Disposition: A | Discharge status: Home / Self Care | Payer: Medicare Other | Source: Ambulatory Visit | Attending: Pulmonary Disease | Admitting: Pulmonary Disease

## 2011-04-05 DIAGNOSIS — R0602 Shortness of breath: Secondary | ICD-10-CM

## 2011-04-20 ENCOUNTER — Other Ambulatory Visit (HOSPITAL_COMMUNITY): Payer: Self-pay | Admitting: Pulmonary Disease

## 2011-04-20 ENCOUNTER — Ambulatory Visit (HOSPITAL_COMMUNITY)
Admission: RE | Admit: 2011-04-20 | Discharge: 2011-04-20 | Disposition: A | Discharge status: Home / Self Care | Payer: Medicare Other | Source: Ambulatory Visit | Attending: Pulmonary Disease | Admitting: Pulmonary Disease

## 2011-04-20 DIAGNOSIS — R52 Pain, unspecified: Secondary | ICD-10-CM

## 2011-04-20 DIAGNOSIS — Z982 Presence of cerebrospinal fluid drainage device: Secondary | ICD-10-CM | POA: Insufficient documentation

## 2011-04-20 DIAGNOSIS — R079 Chest pain, unspecified: Secondary | ICD-10-CM | POA: Insufficient documentation

## 2011-06-08 LAB — BASIC METABOLIC PANEL
Calcium: 9.8
GFR calc Af Amer: >60
GFR calc non Af Amer: >60
Glucose, Bld: 139 — ABNORMAL HIGH
Potassium: 3.4 — ABNORMAL LOW
Sodium: 137

## 2011-06-08 LAB — DIFFERENTIAL
Basophils Relative: 1
Monocytes Absolute: 0.8
Monocytes Relative: 13 — ABNORMAL HIGH
Neutro Abs: 2.6

## 2011-06-08 LAB — CBC
Hemoglobin: 12.9
MCHC: 36.1 — ABNORMAL HIGH
RBC: 3.98

## 2011-06-08 LAB — POCT CARDIAC MARKERS
CKMB, poc: 1.2
Myoglobin, poc: 34.7
Operator id: 286941
Troponin i, poc: <0.05

## 2011-06-11 LAB — OLIGOCLONAL BANDS, CSF + SERM
Albumin Index: 9 ratio (ref 0.0–9.0)
Albumin, CSF: 38 mg/dL — ABNORMAL HIGH (ref 0–35)
IgG, CSF: 5 mg/dL (ref 0.0–6.0)
IgG, Serum: 932 mg/dL (ref 768–1632)

## 2011-06-11 LAB — PROTEIN AND GLUCOSE, CSF: Glucose, CSF: 74

## 2011-06-11 LAB — CSF CELL COUNT WITH DIFFERENTIAL: Tube #: 3

## 2011-08-24 ENCOUNTER — Encounter: Payer: Self-pay | Admitting: *Deleted

## 2011-09-03 DIAGNOSIS — R002 Palpitations: Secondary | ICD-10-CM

## 2011-10-07 ENCOUNTER — Other Ambulatory Visit: Payer: Self-pay | Admitting: *Deleted

## 2011-10-07 ENCOUNTER — Other Ambulatory Visit: Payer: Self-pay | Admitting: Cardiology

## 2011-10-07 DIAGNOSIS — R002 Palpitations: Secondary | ICD-10-CM

## 2011-10-26 ENCOUNTER — Ambulatory Visit: Payer: Medicare Other | Admitting: Orthopedic Surgery

## 2011-10-26 ENCOUNTER — Encounter: Payer: Self-pay | Admitting: Orthopedic Surgery

## 2012-01-15 DIAGNOSIS — M4712 Other spondylosis with myelopathy, cervical region: Secondary | ICD-10-CM | POA: Insufficient documentation

## 2012-02-14 DIAGNOSIS — H9319 Tinnitus, unspecified ear: Secondary | ICD-10-CM | POA: Insufficient documentation

## 2012-08-11 DIAGNOSIS — M542 Cervicalgia: Secondary | ICD-10-CM | POA: Insufficient documentation

## 2012-11-08 ENCOUNTER — Other Ambulatory Visit (HOSPITAL_COMMUNITY): Payer: Self-pay | Admitting: Pulmonary Disease

## 2012-11-08 ENCOUNTER — Ambulatory Visit (HOSPITAL_COMMUNITY)
Admission: RE | Admit: 2012-11-08 | Discharge: 2012-11-08 | Disposition: A | Discharge status: Home / Self Care | Payer: Medicare Other | Source: Ambulatory Visit | Attending: Pulmonary Disease | Admitting: Pulmonary Disease

## 2012-11-08 DIAGNOSIS — R531 Weakness: Secondary | ICD-10-CM

## 2012-11-08 DIAGNOSIS — R5381 Other malaise: Secondary | ICD-10-CM | POA: Insufficient documentation

## 2012-11-08 DIAGNOSIS — R509 Fever, unspecified: Secondary | ICD-10-CM

## 2012-12-12 DIAGNOSIS — G918 Other hydrocephalus: Secondary | ICD-10-CM | POA: Insufficient documentation

## 2012-12-12 DIAGNOSIS — G919 Hydrocephalus, unspecified: Secondary | ICD-10-CM | POA: Insufficient documentation

## 2013-02-20 ENCOUNTER — Encounter: Payer: Self-pay | Admitting: Internal Medicine

## 2013-03-07 ENCOUNTER — Encounter: Payer: Self-pay | Admitting: Internal Medicine

## 2013-03-12 ENCOUNTER — Emergency Department (HOSPITAL_COMMUNITY)
Admission: EM | Admit: 2013-03-12 | Discharge: 2013-03-12 | Disposition: A | Discharge status: Home / Self Care | Payer: Medicare Other | Attending: Emergency Medicine | Admitting: Emergency Medicine

## 2013-03-12 ENCOUNTER — Encounter (HOSPITAL_COMMUNITY): Payer: Self-pay | Admitting: *Deleted

## 2013-03-12 ENCOUNTER — Emergency Department (HOSPITAL_COMMUNITY): Payer: Medicare Other

## 2013-03-12 DIAGNOSIS — R300 Dysuria: Secondary | ICD-10-CM | POA: Insufficient documentation

## 2013-03-12 DIAGNOSIS — Z860100 Personal history of colon polyps, unspecified: Secondary | ICD-10-CM | POA: Insufficient documentation

## 2013-03-12 DIAGNOSIS — Z87891 Personal history of nicotine dependence: Secondary | ICD-10-CM | POA: Insufficient documentation

## 2013-03-12 DIAGNOSIS — J3489 Other specified disorders of nose and nasal sinuses: Secondary | ICD-10-CM | POA: Insufficient documentation

## 2013-03-12 DIAGNOSIS — Z8719 Personal history of other diseases of the digestive system: Secondary | ICD-10-CM | POA: Insufficient documentation

## 2013-03-12 DIAGNOSIS — G8929 Other chronic pain: Secondary | ICD-10-CM | POA: Insufficient documentation

## 2013-03-12 DIAGNOSIS — K219 Gastro-esophageal reflux disease without esophagitis: Secondary | ICD-10-CM | POA: Insufficient documentation

## 2013-03-12 DIAGNOSIS — G4730 Sleep apnea, unspecified: Secondary | ICD-10-CM | POA: Insufficient documentation

## 2013-03-12 DIAGNOSIS — Z8739 Personal history of other diseases of the musculoskeletal system and connective tissue: Secondary | ICD-10-CM | POA: Insufficient documentation

## 2013-03-12 DIAGNOSIS — E785 Hyperlipidemia, unspecified: Secondary | ICD-10-CM | POA: Insufficient documentation

## 2013-03-12 DIAGNOSIS — R21 Rash and other nonspecific skin eruption: Secondary | ICD-10-CM | POA: Insufficient documentation

## 2013-03-12 DIAGNOSIS — R7309 Other abnormal glucose: Secondary | ICD-10-CM | POA: Insufficient documentation

## 2013-03-12 DIAGNOSIS — R509 Fever, unspecified: Secondary | ICD-10-CM

## 2013-03-12 DIAGNOSIS — I10 Essential (primary) hypertension: Secondary | ICD-10-CM | POA: Insufficient documentation

## 2013-03-12 DIAGNOSIS — Z79899 Other long term (current) drug therapy: Secondary | ICD-10-CM | POA: Insufficient documentation

## 2013-03-12 LAB — BASIC METABOLIC PANEL
BUN: 13 mg/dL (ref 6–23)
CO2: 21 mEq/L (ref 19–32)
GFR calc non Af Amer: 62 mL/min — ABNORMAL LOW (ref >90)
Glucose, Bld: 85 mg/dL (ref 70–99)
Potassium: 3.6 mEq/L (ref 3.5–5.1)
Sodium: 142 mEq/L (ref 135–145)

## 2013-03-12 LAB — URINALYSIS, ROUTINE W REFLEX MICROSCOPIC
Bilirubin Urine: NEGATIVE
Hgb urine dipstick: NEGATIVE
Nitrite: NEGATIVE
Specific Gravity, Urine: 1.01 (ref 1.005–1.030)
pH: 8 (ref 5.0–8.0)

## 2013-03-12 LAB — CBC WITH DIFFERENTIAL/PLATELET
Eosinophils Absolute: 0.1 10*3/uL (ref 0.0–0.7)
Hemoglobin: 14.2 g/dL (ref 12.0–15.0)
Lymphocytes Relative: 28 % (ref 12–46)
Lymphs Abs: 1.5 10*3/uL (ref 0.7–4.0)
MCH: 30.1 pg (ref 26.0–34.0)
MCV: 85.8 fL (ref 78.0–100.0)
Monocytes Relative: 7 % (ref 3–12)
Neutrophils Relative %: 64 % (ref 43–77)
RBC: 4.72 MIL/uL (ref 3.87–5.11)
WBC: 5.3 10*3/uL (ref 4.0–10.5)

## 2013-03-12 MED ORDER — ALPRAZOLAM 0.5 MG PO TABS: 0.2500 mg | ORAL_TABLET | Freq: Once | ORAL | Status: DC

## 2013-03-12 NOTE — ED Provider Notes (Signed)
History    CSN: [id number] Arrival date & time 03/12/13  1450  First MD Initiated Contact with Patient 03/12/13 1640     Chief Complaint  Patient presents with  . Fever   (Consider location/radiation/quality/duration/timing/severity/associated sxs/prior Treatment) Patient is a 52 y.o. female presenting with fever. The history is provided by the patient.  Fever Associated symptoms: chills, congestion, dysuria and rash   Associated symptoms: no confusion, no diarrhea, no headaches, no nausea and no vomiting     Patient here with complaint of fever. When she gets or chills it feels like needles are sticking all over her. Patient with recent treatment for sinus infection and urinary tract infection was treated with Levaquin. About 4 days for the Levaquin was finished patient developed a rash on her abdomen and chest that now has some areas of scabbing. No history of tick bites no cough no nausea vomiting or diarrhea. Patient does still have some congestion. No headache. Temperature here is normal. Past Medical History  Diagnosis Date  . Arthritis   . Chronic headaches   . GERD (gastroesophageal reflux disease)   . Hyperlipidemia   . HTN (hypertension)   . IBS (irritable bowel syndrome)   . Diverticulosis   . Colon polyps     nonadenomatous  . Esophageal stricture   . Sleep apnea   . Borderline diabetic    Past Surgical History  Procedure Laterality Date  . Cholecystectomy    . Tubal ligation    . Total abdominal hysterectomy    . Left breast-lumpectomy    . Shunt put in on right side of brain    . Cahri decompression  6 and 03/1999  . Invasive cervical traction  02/2009  . Shunt replacement  12/10   Family History  Problem Relation Age of Onset  . Colon polyps Mother   . Colon cancer Neg Hx    History  Substance Use Topics  . Smoking status: Former Games developer  . Smokeless tobacco: Never Used  . Alcohol Use: No   OB History   Grav Para Term Preterm Abortions TAB SAB  Ect Mult Living                 Review of Systems  Constitutional: Positive for fever and chills.  HENT: Positive for congestion and sinus pressure. Negative for neck pain and neck stiffness.   Eyes: Negative for pain.  Gastrointestinal: Negative for nausea, vomiting, abdominal pain and diarrhea.  Genitourinary: Positive for dysuria. Negative for hematuria.  Musculoskeletal: Negative for back pain.  Skin: Positive for rash.  Neurological: Negative for headaches.  Psychiatric/Behavioral: Negative for confusion.    Allergies  Propoxyphene-acetaminophen  Home Medications   Current Outpatient Rx  Name  Route  Sig  Dispense  Refill  . acetaZOLAMIDE (DIAMOX) 250 MG tablet   Oral   Take 250 mg by mouth 4 (four) times daily - after meals and at bedtime.         . diclofenac sodium (VOLTAREN) 1 % GEL   Topical   Apply 4 g topically daily as needed (for pain).         Marland Kitchen gabapentin (NEURONTIN) 300 MG capsule   Oral   Take 600 mg by mouth 3 (three) times daily.          . meloxicam (MOBIC) 15 MG tablet   Oral   Take 15 mg by mouth daily.         . metaxalone (SKELAXIN) 800 MG tablet  Oral   Take 800 mg by mouth 3 (three) times daily.         . potassium chloride SA (K-DUR,KLOR-CON) 20 MEQ tablet   Oral   Take 20 mEq by mouth daily.         . traMADol (ULTRAM) 50 MG tablet   Oral   Take 50-100 mg by mouth every 6 (six) hours as needed for pain.         Marland Kitchen levofloxacin (LEVAQUIN) 500 MG tablet   Oral   Take 500 mg by mouth daily.          BP 120/76  Pulse 66  Temp(Src) 98.3 F (36.8 C) (Oral)  Resp 20  Ht 5\' 2"  (1.575 m)  Wt 180 lb (81.647 kg)  BMI 32.91 kg/m2  SpO2 96% Physical Exam  Nursing note and vitals reviewed. Constitutional: She is oriented to person, place, and time. She appears well-developed and well-nourished. No distress.  HENT:  Head: Normocephalic and atraumatic.  Mouth/Throat: Oropharynx is clear and moist. No oropharyngeal  exudate.  Eyes: Conjunctivae and EOM are normal. Pupils are equal, round, and reactive to light.  Neck: Normal range of motion. Neck supple.  Cardiovascular: Normal rate, regular rhythm, normal heart sounds and intact distal pulses.   No murmur heard. Pulmonary/Chest: Effort normal and breath sounds normal. No respiratory distress.  Abdominal: There is no tenderness.  Musculoskeletal: Normal range of motion. She exhibits no edema and no tenderness.  Neurological: She is alert and oriented to person, place, and time. No cranial nerve deficit. Coordination normal.  Skin: Skin is warm. Rash noted. No erythema.  Papular rash to the abdomen and some on the chest. Most of them scabbed over. Nonvesicular.    ED Course  Procedures (including critical care time) Labs Reviewed  BASIC METABOLIC PANEL - Abnormal; Notable for the following:    GFR calc non Af Amer 62 (*)    GFR calc Af Amer 72 (*)    All other components within normal limits  URINALYSIS, ROUTINE W REFLEX MICROSCOPIC  CBC WITH DIFFERENTIAL   Dg Chest 2 View  03/12/2013   *RADIOLOGY REPORT*  Clinical Data: Fever and chills.  CHEST - 2 VIEW  Comparison: PA and lateral chest 11/08/2012 and 04/05/2011.  Findings: Lungs clear.  Heart size normal.  No pneumothorax or pleural fluid.  No focal bony abnormality.  Status post lower cervical fusion.  IMPRESSION: No acute finding.   Original Report Authenticated By: Holley Dexter, M.D.   Results for orders placed during the hospital encounter of 03/12/13  URINALYSIS, ROUTINE W REFLEX MICROSCOPIC      Result Value Range   Color, Urine YELLOW  YELLOW   APPearance CLEAR  CLEAR   Specific Gravity, Urine 1.010  1.005 - 1.030   pH 8.0  5.0 - 8.0   Glucose, UA NEGATIVE  NEGATIVE mg/dL   Hgb urine dipstick NEGATIVE  NEGATIVE   Bilirubin Urine NEGATIVE  NEGATIVE   Ketones, ur NEGATIVE  NEGATIVE mg/dL   Protein, ur NEGATIVE  NEGATIVE mg/dL   Urobilinogen, UA 0.2  0.0 - 1.0 mg/dL   Nitrite  NEGATIVE  NEGATIVE   Leukocytes, UA NEGATIVE  NEGATIVE  CBC WITH DIFFERENTIAL      Result Value Range   WBC 5.3  4.0 - 10.5 K/uL   RBC 4.72  3.87 - 5.11 MIL/uL   Hemoglobin 14.2  12.0 - 15.0 g/dL   HCT 16.1  09.6 - 04.5 %   MCV 85.8  78.0 - 100.0 fL   MCH 30.1  26.0 - 34.0 pg   MCHC 35.1  30.0 - 36.0 g/dL   RDW 16.1  09.6 - 04.5 %   Platelets 193  150 - 400 K/uL   Neutrophils Relative % 64  43 - 77 %   Neutro Abs 3.4  1.7 - 7.7 K/uL   Lymphocytes Relative 28  12 - 46 %   Lymphs Abs 1.5  0.7 - 4.0 K/uL   Monocytes Relative 7  3 - 12 %   Monocytes Absolute 0.4  0.1 - 1.0 K/uL   Eosinophils Relative 1  0 - 5 %   Eosinophils Absolute 0.1  0.0 - 0.7 K/uL   Basophils Relative 0  0 - 1 %   Basophils Absolute 0.0  0.0 - 0.1 K/uL  BASIC METABOLIC PANEL      Result Value Range   Sodium 142  135 - 145 mEq/L   Potassium 3.6  3.5 - 5.1 mEq/L   Chloride 107  96 - 112 mEq/L   CO2 21  19 - 32 mEq/L   Glucose, Bld 85  70 - 99 mg/dL   BUN 13  6 - 23 mg/dL   Creatinine, Ser 4.09  0.50 - 1.10 mg/dL   Calcium 9.6  8.4 - 81.1 mg/dL   GFR calc non Af Amer 62 (*) >90 mL/min   GFR calc Af Amer 72 (*) >90 mL/min       1. Fever     MDM  No evidence of urinary tract infection today. Chest x-rays negative for pneumonia. Patient had been on the Levaquin for both sinusitis and for her urinary tract infection. Patient does subjectively still has fevers afebrile here will continue treat with Tylenol for fever if patient follow primary care Dr. return for any new worse symptoms. No leukocytosis today no anemia no electrolyte abnormalities.  Patient's rash that started out towards the end of the Levaquin no treatment. May very well represent a drug allergic reaction. That is resolving also possibly could be a viral rash. Patient will followup with her primary care doctor and let know about the reaction to Levaquin as well.   Shelda Jakes, MD 03/12/13 [phone]

## 2013-03-12 NOTE — ED Notes (Signed)
Fever," feels like needles sticking me all over"  Recently tx for sinus infection and uti and took levaquin,  Then had a rash? From  Antibiotic.  No tick bites.no cough, Nausea.

## 2013-03-15 DIAGNOSIS — G935 Compression of brain: Secondary | ICD-10-CM | POA: Insufficient documentation

## 2013-03-15 DIAGNOSIS — Z981 Arthrodesis status: Secondary | ICD-10-CM | POA: Insufficient documentation

## 2013-04-26 ENCOUNTER — Encounter: Payer: Self-pay | Admitting: Internal Medicine

## 2013-04-26 ENCOUNTER — Ambulatory Visit (AMBULATORY_SURGERY_CENTER): Payer: Medicare Other

## 2013-04-26 VITALS — Ht 62.0 in | Wt 185.0 lb

## 2013-04-26 DIAGNOSIS — Z860100 Personal history of colon polyps, unspecified: Secondary | ICD-10-CM

## 2013-04-26 MED ORDER — MOVIPREP 100 G PO SOLR: 1.0000 | Freq: Once | ORAL | Status: DC

## 2013-05-10 ENCOUNTER — Encounter: Payer: Self-pay | Admitting: Internal Medicine

## 2013-05-10 ENCOUNTER — Ambulatory Visit (AMBULATORY_SURGERY_CENTER): Payer: Medicare Other | Admitting: Internal Medicine

## 2013-05-10 VITALS — BP 121/75 | HR 74 | Temp 99.2°F | Resp 16 | Ht 62.0 in | Wt 185.0 lb

## 2013-05-10 DIAGNOSIS — D126 Benign neoplasm of colon, unspecified: Secondary | ICD-10-CM

## 2013-05-10 DIAGNOSIS — Z86010 Personal history of colonic polyps: Secondary | ICD-10-CM

## 2013-05-10 MED ORDER — SODIUM CHLORIDE 0.9 % IV SOLN: 500.0000 mL | INTRAVENOUS | Status: DC

## 2013-05-10 NOTE — Op Note (Signed)
[facility] [address] [place], [address]   COLONOSCOPY PROCEDURE REPORT  PATIENT: Amanda Webster, Amanda Webster  MR#: [id number] BIRTHDATE: 1960-12-31 , 52  yrs. old GENDER: Female ENDOSCOPIST: Roxy Cedar, MD REFERRED [id number] Recall, PROCEDURE DATE:  05/10/2013 PROCEDURE:   Colonoscopy with snare polypectomy x 1 First Screening Colonoscopy - Avg.  risk and is 50 yrs.  old or older Yes.  Prior Negative Screening - Now for repeat screening. N/A  History of Adenoma - Now for follow-up colonoscopy & has been > or = to 3 yrs.  N/A  Polyps Removed Today? Yes. ASA CLASS:   Class II INDICATIONS:average risk screening.   Negative exam 2003 MEDICATIONS: MAC sedation, administered by CRNA and propofol (Diprivan) 350mg  IV  DESCRIPTION OF PROCEDURE:   After the risks benefits and alternatives of the procedure were thoroughly explained, informed consent was obtained.  A digital rectal exam revealed no abnormalities of the rectum.   The LB [id number]  endoscope was introduced through the anus and advanced to the cecum, which was identified by both the appendix and ileocecal valve. No adverse events experienced.   The quality of the prep was excellent, using MoviPrep  The instrument was then slowly withdrawn as the colon was fully examined.    COLON FINDINGS: A diminutive polyp was found at the cecum.  A polypectomy was performed with a cold snare.  The resection was complete and the polyp tissue was completely retrieved.   Moderate diverticulosis was noted The finding was in the left colon.   The colon mucosa was otherwise normal.  Retroflexed views revealed no abnormalities. The time to cecum=3 minutes 30 seconds.  Withdrawal time=11 minutes 07 seconds.  The scope was withdrawn and the procedure completed. COMPLICATIONS: There were no complications.  ENDOSCOPIC IMPRESSION: 1.   Diminutive polyp was found at the cecum; polypectomy was performed with a cold  snare 2.   Moderate diverticulosis was noted in the left colon 3.   The colon mucosa was otherwise normal  RECOMMENDATIONS: 1. Repeat colonoscopy in 5 years if polyp adenomatous; otherwise 10 years   eSigned:  Roxy Cedar, MD 05/10/2013 9:26 AM   cc: Shaune Pollack, MD and The Patient   PATIENT NAME:  Amanda Webster, Amanda Webster MR#: [id number]

## 2013-05-10 NOTE — Progress Notes (Signed)
Called to room to assist during endoscopic procedure.  Patient ID and intended procedure confirmed with present staff. Received instructions for my participation in the procedure from the performing physician.  

## 2013-05-10 NOTE — Patient Instructions (Addendum)
Findings:  Polyp, Diverticulosis Recommendations:  Repeat colonoscopy in 5-10 years depending on pathology  YOU HAD AN ENDOSCOPIC PROCEDURE TODAY AT THE [facility]: Refer to the procedure report that was given to you for any specific questions about what was found during the examination.  If the procedure report does not answer your questions, please call your gastroenterologist to clarify.  If you requested that your care partner not be given the details of your procedure findings, then the procedure report has been included in a sealed envelope for you to review at your convenience later.  YOU SHOULD EXPECT: Some feelings of bloating in the abdomen. Passage of more gas than usual.  Walking can help get rid of the air that was put into your GI tract during the procedure and reduce the bloating. If you had a lower endoscopy (such as a colonoscopy or flexible sigmoidoscopy) you may notice spotting of blood in your stool or on the toilet paper. If you underwent a bowel prep for your procedure, then you may not have a normal bowel movement for a few days.  DIET: Your first meal following the procedure should be a light meal and then it is ok to progress to your normal diet.  A half-sandwich or bowl of soup is an example of a good first meal.  Heavy or fried foods are harder to digest and may make you feel nauseous or bloated.  Likewise meals heavy in dairy and vegetables can cause extra gas to form and this can also increase the bloating.  Drink plenty of fluids but you should avoid alcoholic beverages for 24 hours.  ACTIVITY: Your care partner should take you home directly after the procedure.  You should plan to take it easy, moving slowly for the rest of the day.  You can resume normal activity the day after the procedure however you should NOT DRIVE or use heavy machinery for 24 hours (because of the sedation medicines used during the test).    SYMPTOMS TO REPORT IMMEDIATELY: A  gastroenterologist can be reached at any hour.  During normal business hours, 8:30 AM to 5:00 PM Monday through Friday, call [phone].  After hours and on weekends, please call the GI answering service at [phone] who will take a message and have the physician on call contact you.   Following lower endoscopy (colonoscopy or flexible sigmoidoscopy):  Excessive amounts of blood in the stool  Significant tenderness or worsening of abdominal pains  Swelling of the abdomen that is new, acute  Fever of 100F or higher  Following upper endoscopy (EGD)  Vomiting of blood or coffee ground material  New chest pain or pain under the shoulder blades  Painful or persistently difficult swallowing  New shortness of breath  Fever of 100F or higher  Black, tarry-looking stools  FOLLOW UP: If any biopsies were taken you will be contacted by phone or by letter within the next 1-3 weeks.  Call your gastroenterologist if you have not heard about the biopsies in 3 weeks.  Our staff will call the home number listed on your records the next business day following your procedure to check on you and address any questions or concerns that you may have at that time regarding the information given to you following your procedure. This is a courtesy call and so if there is no answer at the home number and we have not heard from you through the emergency physician on call, we will assume that you  have returned to your regular daily activities without incident.  SIGNATURES/CONFIDENTIALITY: You and/or your care partner have signed paperwork which will be entered into your electronic medical record.  These signatures attest to the fact that that the information above on your After Visit Summary has been reviewed and is understood.  Full responsibility of the confidentiality of this discharge information lies with you and/or your care-partner.  Please follow all discharge instructions given to you by the recovery  room nurse. If you have any questions or problems after discharge please call one of the numbers listed above. You will receive a phone call in the am to see how you are doing and answer any questions you may have. Thank you for choosing [facility] for your health care needs.

## 2013-05-10 NOTE — Progress Notes (Signed)
Patient did not experience any of the following events: a burn prior to discharge; a fall within the facility; wrong site/side/patient/procedure/implant event; or a hospital transfer or hospital admission upon discharge from the facility. (G8907) Patient did not have preoperative order for IV antibiotic SSI prophylaxis. (G8918)  

## 2013-05-10 NOTE — Progress Notes (Signed)
Lidocaine-40mg IV prior to Propofol InductionPropofol given over incremental dosages 

## 2013-05-11 ENCOUNTER — Telehealth: Payer: Self-pay | Admitting: *Deleted

## 2013-05-11 NOTE — Telephone Encounter (Signed)
  Follow up Call-  Call back number 05/10/2013  Post procedure Call Back phone  # [phone]  Permission to leave phone message Yes     Patient questions:  Do you have a fever, pain , or abdominal swelling? no Pain Score  0 *  Have you tolerated food without any problems? yes  Have you been able to return to your normal activities? yes  Do you have any questions about your discharge instructions: Diet   no Medications  no Follow up visit  no  Do you have questions or concerns about your Care? no  Actions: * If pain score is 4 or above: No action needed, pain <4.

## 2013-05-18 ENCOUNTER — Encounter: Payer: Self-pay | Admitting: Internal Medicine

## 2013-07-08 LAB — PULMONARY FUNCTION TEST

## 2013-07-10 ENCOUNTER — Other Ambulatory Visit: Payer: Self-pay

## 2013-07-10 DIAGNOSIS — Z1231 Encounter for screening mammogram for malignant neoplasm of breast: Secondary | ICD-10-CM

## 2013-08-01 ENCOUNTER — Ambulatory Visit
Admission: RE | Admit: 2013-08-01 | Discharge: 2013-08-01 | Disposition: A | Discharge status: Home / Self Care | Payer: Medicare Other | Source: Ambulatory Visit

## 2013-08-01 DIAGNOSIS — Z1231 Encounter for screening mammogram for malignant neoplasm of breast: Secondary | ICD-10-CM

## 2014-04-17 ENCOUNTER — Emergency Department (HOSPITAL_COMMUNITY)
Admission: EM | Admit: 2014-04-17 | Discharge: 2014-04-17 | Disposition: A | Discharge status: Home / Self Care | Payer: Medicare Other | Attending: Emergency Medicine | Admitting: Emergency Medicine

## 2014-04-17 ENCOUNTER — Encounter (HOSPITAL_COMMUNITY): Payer: Self-pay | Admitting: Emergency Medicine

## 2014-04-17 ENCOUNTER — Emergency Department (HOSPITAL_COMMUNITY): Payer: Medicare Other

## 2014-04-17 DIAGNOSIS — Z87891 Personal history of nicotine dependence: Secondary | ICD-10-CM | POA: Insufficient documentation

## 2014-04-17 DIAGNOSIS — Z791 Long term (current) use of non-steroidal anti-inflammatories (NSAID): Secondary | ICD-10-CM | POA: Diagnosis not present

## 2014-04-17 DIAGNOSIS — G8929 Other chronic pain: Secondary | ICD-10-CM | POA: Insufficient documentation

## 2014-04-17 DIAGNOSIS — Z8659 Personal history of other mental and behavioral disorders: Secondary | ICD-10-CM | POA: Insufficient documentation

## 2014-04-17 DIAGNOSIS — M542 Cervicalgia: Secondary | ICD-10-CM | POA: Insufficient documentation

## 2014-04-17 DIAGNOSIS — Z8739 Personal history of other diseases of the musculoskeletal system and connective tissue: Secondary | ICD-10-CM | POA: Insufficient documentation

## 2014-04-17 DIAGNOSIS — Z8719 Personal history of other diseases of the digestive system: Secondary | ICD-10-CM | POA: Insufficient documentation

## 2014-04-17 DIAGNOSIS — R51 Headache: Secondary | ICD-10-CM | POA: Diagnosis present

## 2014-04-17 DIAGNOSIS — Z860100 Personal history of colon polyps, unspecified: Secondary | ICD-10-CM | POA: Insufficient documentation

## 2014-04-17 DIAGNOSIS — M5137 Other intervertebral disc degeneration, lumbosacral region: Secondary | ICD-10-CM | POA: Diagnosis not present

## 2014-04-17 DIAGNOSIS — Z79899 Other long term (current) drug therapy: Secondary | ICD-10-CM | POA: Diagnosis not present

## 2014-04-17 DIAGNOSIS — I10 Essential (primary) hypertension: Secondary | ICD-10-CM | POA: Insufficient documentation

## 2014-04-17 DIAGNOSIS — M5136 Other intervertebral disc degeneration, lumbar region: Secondary | ICD-10-CM

## 2014-04-17 DIAGNOSIS — R519 Headache, unspecified: Secondary | ICD-10-CM

## 2014-04-17 DIAGNOSIS — M51379 Other intervertebral disc degeneration, lumbosacral region without mention of lumbar back pain or lower extremity pain: Secondary | ICD-10-CM | POA: Insufficient documentation

## 2014-04-17 HISTORY — DX: Depression, unspecified: F32.A

## 2014-04-17 HISTORY — DX: Major depressive disorder, single episode, unspecified: F32.9

## 2014-04-17 MED ORDER — DIAZEPAM 5 MG PO TABS
5.0000 mg | ORAL_TABLET | Freq: Once | ORAL | Status: AC
  Administered 2014-04-17: 5 mg via ORAL
  Filled 2014-04-17: qty 1

## 2014-04-17 MED ORDER — TRAMADOL HCL 50 MG PO TABS
100.0000 mg | ORAL_TABLET | Freq: Once | ORAL | Status: AC
  Administered 2014-04-17: 100 mg via ORAL
  Filled 2014-04-17: qty 2

## 2014-04-17 MED ORDER — DICLOFENAC SODIUM 75 MG PO TBEC: 75.0000 mg | DELAYED_RELEASE_TABLET | Freq: Two times a day (BID) | ORAL | Status: DC

## 2014-04-17 MED ORDER — SODIUM CHLORIDE 0.9 % IV BOLUS (SEPSIS)
1000.0000 mL | INTRAVENOUS | Status: AC
  Administered 2014-04-17: 1000 mL via INTRAVENOUS

## 2014-04-17 MED ORDER — METHOCARBAMOL 500 MG PO TABS: 500.0000 mg | ORAL_TABLET | Freq: Three times a day (TID) | ORAL | Status: DC

## 2014-04-17 MED ORDER — TRAMADOL HCL 50 MG PO TABS: ORAL_TABLET | ORAL | Status: DC

## 2014-04-17 MED ORDER — KETOROLAC TROMETHAMINE 10 MG PO TABS
10.0000 mg | ORAL_TABLET | Freq: Once | ORAL | Status: AC
  Administered 2014-04-17: 10 mg via ORAL
  Filled 2014-04-17: qty 1

## 2014-04-17 NOTE — ED Notes (Signed)
Pt states chronic neck and back pain flared up x 2 weeks ago. States aleve usually works but is not lasting long enough.

## 2014-04-17 NOTE — ED Provider Notes (Signed)
CSN: [id number]     Arrival date & time 04/17/14  1432 History   First MD Initiated Contact with Patient 04/17/14 1609     Chief Complaint  Patient presents with  . Pain     (Consider location/radiation/quality/duration/timing/severity/associated sxs/prior Treatment) HPI Comments: Pt is a 53 year old female who presents to the ED with c/o chronic neck and back pain. She has hx of arthritis. She reports this current flare has been going on for  2 weeks. No recent injury. Patient states she is very concerned because it feels as though there is something this is a this in the lower portion of her back. She is also concerned that the pain in her neck and her back or not responding light usual to the Aleve. The patient denies any loss of bowel or bladder function. He's not had any unusual falls. There's been no high fevers reported. She complains of headaches, but has had headaches since her Cahri decompression in 2000. She has tried ibuprofen but without success. Patient states she spoke with her primary physician, but cannot see the primary physician until August 28.  The history is provided by the patient.    Past Medical History  Diagnosis Date  . Arthritis   . Chronic headaches   . GERD (gastroesophageal reflux disease)   . Hyperlipidemia   . HTN (hypertension)   . IBS (irritable bowel syndrome)   . Diverticulosis   . Colon polyps     nonadenomatous  . Esophageal stricture   . Sleep apnea   . Borderline diabetic   . Depression    Past Surgical History  Procedure Laterality Date  . Cholecystectomy    . Tubal ligation    . Total abdominal hysterectomy    . Left breast-lumpectomy    . Shunt put in on right side of brain    . Cahri decompression  6 and 03/1999  . Invasive cervical traction  02/2009  . Shunt replacement  12/10   Family History  Problem Relation Age of Onset  . Colon polyps Mother   . Colon cancer Neg Hx    History  Substance Use Topics  . Smoking status:  Former Research scientist (life sciences)  . Smokeless tobacco: Never Used  . Alcohol Use: No   OB History   Grav Para Term Preterm Abortions TAB SAB Ect Mult Living                 Review of Systems  Constitutional: Negative for activity change.       All ROS Neg except as noted in HPI  HENT: Negative for nosebleeds.   Eyes: Negative for photophobia and discharge.  Respiratory: Negative for cough, shortness of breath and wheezing.   Cardiovascular: Negative for chest pain and palpitations.  Gastrointestinal: Negative for abdominal pain and blood in stool.  Genitourinary: Negative for dysuria, frequency and hematuria.  Musculoskeletal: Positive for arthralgias, back pain and neck pain.  Skin: Negative.   Neurological: Positive for headaches. Negative for dizziness, seizures and speech difficulty.  Psychiatric/Behavioral: Negative for hallucinations and confusion.      Allergies  Cymbalta and Propoxyphene n-acetaminophen  Home Medications   Prior to Admission medications   Medication Sig Start Date End Date Taking? Authorizing Provider  acetaZOLAMIDE (DIAMOX) 250 MG tablet Take 250-500 mg by mouth 3 (three) times daily. Takes one tablet in the morning and two tablets at lunch and one tablet at dinner   Yes Historical Provider, MD  naproxen sodium (ALEVE) 220 MG  tablet Take 440 mg by mouth daily as needed (for pain).   Yes Historical Provider, MD  potassium chloride SA (K-DUR,KLOR-CON) 20 MEQ tablet Take 20 mEq by mouth daily.   Yes Historical Provider, MD   BP 148/99  Pulse 89  Temp(Src) 99.1 F (37.3 C) (Oral)  Resp 16  Ht 5\' 2"  (1.575 m)  Wt 182 lb (82.555 kg)  BMI 33.28 kg/m2  SpO2 100% Physical Exam  Nursing note and vitals reviewed. Constitutional: She is oriented to person, place, and time. She appears well-developed and well-nourished.  Non-toxic appearance.  HENT:  Head: Normocephalic.  Right Ear: Tympanic membrane and external ear normal.  Left Ear: Tympanic membrane and external ear  normal.  Eyes: EOM and lids are normal. Pupils are equal, round, and reactive to light.  Neck: Normal range of motion. Neck supple. Carotid bruit is not present.  Cardiovascular: Normal rate, regular rhythm, normal heart sounds, intact distal pulses and normal pulses.   Pulmonary/Chest: Breath sounds normal. No respiratory distress.  Abdominal: Soft. Bowel sounds are normal. There is no tenderness. There is no guarding.  Musculoskeletal: Normal range of motion.  There is a well-healed surgical scar of the posterior portion of the neck. There is limited range of motion of the cervical spine. There is no palpable step off of the thoracic or the lumbar spine area. There is mild to moderate spasm in the paraspinal area of the lumbar region. There no hot areas appreciated. There is pain to palpation of the mid lumbar spine area.  Lymphadenopathy:       Head (right side): No submandibular adenopathy present.       Head (left side): No submandibular adenopathy present.    She has no cervical adenopathy.  Neurological: She is alert and oriented to person, place, and time. She has normal strength. No cranial nerve deficit or sensory deficit.  No gross neurologic deficits appreciated. Gait is slow but steady.-related baseline according to the patient. Speech is clear and understandable. There no major motor asymmetry appreciated of the upper or lower extremities.  Skin: Skin is warm and dry.  Psychiatric: Her speech is normal. Her mood appears anxious.  Patient. Anxious and easily upset.    ED Course  Procedures (including critical care time) Labs Review Labs Reviewed - No data to display  Imaging Review No results found.   EKG Interpretation None      MDM Temperature 99.1, pulse rate 89, respiratory 16, blood pressure 148/99. Discuss with patient the need to have her blood pressure rechecked. The pulse oximetry is 100% on room air. Within normal limits by my interpretation. Pt request to  talk to someone else about her pain and her back. Pt seen with me by Dr Aline Brochure. CT head and xray of the l spine ordered. Patient treated in the emergency department with valium 5 mg, tramadol 100 mg, Toradol 10 mg.   Results of the CT and spine xray given to the patient. Questions answered. Pt in agreement with discharge plan. Pt to see Dr Luan Pulling for evaluation and management of the back pain and the headaches. Rx for robaxin, tramadol, and diclofenac given to the patient. She will return if any emergency changes or problem.   Final diagnoses:  None    *I have reviewed nursing notes, vital signs, and all appropriate lab and imaging results for this patient.Lenox Ahr, PA-C 04/17/14 1759

## 2014-04-17 NOTE — Discharge Instructions (Signed)
CT scan of your head is negative for changes in your shunt, or changes of the brain. The CT scan of your lumbar spine reveals degenerative disc disease, more at the L4-L5 area than other areas. Please see Dr. Luan Pulling for additional evaluation, pain management Degenerative Disk Disease Degenerative disk disease is a condition caused by the changes that occur in the cushions of the backbone (spinal disks) as you grow older. Spinal disks are soft and compressible disks located between the bones of the spine (vertebrae). They act like shock absorbers. Degenerative disk disease can affect the whole spine. However, the neck and lower back are most commonly affected. Many changes can occur in the spinal disks with aging, such as:  The spinal disks may dry and shrink.  Small tears may occur in the tough, outer covering of the disk (annulus).  The disk space may become smaller due to loss of water.  Abnormal growths in the bone (spurs) may occur. This can put pressure on the nerve roots exiting the spinal canal, causing pain.  The spinal canal may become narrowed. CAUSES  Degenerative disk disease is a condition caused by the changes that occur in the spinal disks with aging. The exact cause is not known, but there is a genetic basis for many patients. Degenerative changes can occur due to loss of fluid in the disk. This makes the disk thinner and reduces the space between the backbones. Small cracks can develop in the outer layer of the disk. This can lead to the breakdown of the disk. You are more likely to get degenerative disk disease if you are overweight. Smoking cigarettes and doing heavy work such as weightlifting can also increase your risk of this condition. Degenerative changes can start after a sudden injury. Growth of bone spurs can compress the nerve roots and cause pain.  SYMPTOMS  The symptoms vary from person to person. Some people may have no pain, while others have severe pain. The pain  may be so severe that it can limit your activities. The location of the pain depends on the part of your backbone that is affected. You will have neck or arm pain if a disk in the neck area is affected. You will have pain in your back, buttocks, or legs if a disk in the lower back is affected. The pain becomes worse while bending, reaching up, or with twisting movements. The pain may start gradually and then get worse as time passes. It may also start after a major or minor injury. You may feel numbness or tingling in the arms or legs.  DIAGNOSIS  Your caregiver will ask you about your symptoms and about activities or habits that may cause the pain. He or she may also ask about any injuries, diseases, or treatments you have had earlier. Your caregiver will examine you to check for the range of movement that is possible in the affected area, to check for strength in your extremities, and to check for sensation in the areas of the arms and legs supplied by different nerve roots. An X-ray of the spine may be taken. Your caregiver may suggest other imaging tests, such as magnetic resonance imaging (MRI), if needed.  TREATMENT  Treatment includes rest, modifying your activities, and applying ice and heat. Your caregiver may prescribe medicines to reduce your pain and may ask you to do some exercises to strengthen your back. In some cases, you may need surgery. You and your caregiver will decide on the treatment that  is best for you. HOME CARE INSTRUCTIONS   Follow proper lifting and walking techniques as advised by your caregiver.  Maintain good posture.  Exercise regularly as advised.  Perform relaxation exercises.  Change your sitting, standing, and sleeping habits as advised. Change positions frequently.  Lose weight as advised.  Stop smoking if you smoke.  Wear supportive footwear. SEEK MEDICAL CARE IF:  Your pain does not go away within 1 to 4 weeks. SEEK IMMEDIATE MEDICAL CARE IF:   Your  pain is severe.  You notice weakness in your arms, hands, or legs.  You begin to lose control of your bladder or bowel movements. MAKE SURE YOU:   Understand these instructions.  Will watch your condition.  Will get help right away if you are not doing well or get worse. Document Released: 06/27/2007 Document Revised: 11/22/2011 Document Reviewed: 01/01/2014 [facility] Patient Information 2015 [place], [place]. This information is not intended to replace advice given to you by your health care provider. Make sure you discuss any questions you have with your health care provider. , and referral to the appropriate specialist. Please use diclofenac and Robaxin daily. Use Ultram every 6 hours if needed for pain. Ultram and Robaxin may cause drowsiness, please use with caution.

## 2014-04-18 NOTE — ED Provider Notes (Signed)
Medical screening examination/treatment/procedure(s) were conducted as a shared visit with non-physician practitioner(s) and myself.  I personally evaluated the patient during the encounter.   EKG Interpretation None      I interviewed and examined the patient. Lungs are CTAB. Cardiac exam wnl. Abdomen soft.  Normal strength and sensation diffusely on my exam. HA c/w chronic HA's. She denies new injury or fever. Will get screening imaging, pain control.   Pamella Pert, MD 04/18/14 [phone]

## 2015-02-17 ENCOUNTER — Other Ambulatory Visit (HOSPITAL_COMMUNITY): Payer: Self-pay | Admitting: Pulmonary Disease

## 2015-02-17 ENCOUNTER — Ambulatory Visit (HOSPITAL_COMMUNITY)
Admission: RE | Admit: 2015-02-17 | Discharge: 2015-02-17 | Disposition: A | Discharge status: Home / Self Care | Payer: Medicare Other | Source: Ambulatory Visit | Attending: Pulmonary Disease | Admitting: Pulmonary Disease

## 2015-02-17 DIAGNOSIS — Z87891 Personal history of nicotine dependence: Secondary | ICD-10-CM | POA: Insufficient documentation

## 2015-02-17 DIAGNOSIS — I10 Essential (primary) hypertension: Secondary | ICD-10-CM | POA: Diagnosis not present

## 2015-02-17 DIAGNOSIS — E785 Hyperlipidemia, unspecified: Secondary | ICD-10-CM | POA: Diagnosis not present

## 2015-02-17 DIAGNOSIS — R079 Chest pain, unspecified: Secondary | ICD-10-CM

## 2015-03-15 DIAGNOSIS — Z982 Presence of cerebrospinal fluid drainage device: Secondary | ICD-10-CM | POA: Insufficient documentation

## 2015-07-18 ENCOUNTER — Other Ambulatory Visit (HOSPITAL_COMMUNITY): Payer: Self-pay | Admitting: Pulmonary Disease

## 2015-07-18 DIAGNOSIS — J329 Chronic sinusitis, unspecified: Secondary | ICD-10-CM

## 2015-07-24 ENCOUNTER — Ambulatory Visit (HOSPITAL_COMMUNITY): Payer: Medicare Other

## 2015-07-24 ENCOUNTER — Ambulatory Visit (HOSPITAL_COMMUNITY)
Admission: RE | Admit: 2015-07-24 | Discharge: 2015-07-24 | Disposition: A | Discharge status: Home / Self Care | Payer: Medicare Other | Source: Ambulatory Visit | Attending: Pulmonary Disease | Admitting: Pulmonary Disease

## 2015-07-24 DIAGNOSIS — J329 Chronic sinusitis, unspecified: Secondary | ICD-10-CM

## 2015-07-24 DIAGNOSIS — R51 Headache: Secondary | ICD-10-CM | POA: Insufficient documentation

## 2015-07-24 DIAGNOSIS — R938 Abnormal findings on diagnostic imaging of other specified body structures: Secondary | ICD-10-CM | POA: Diagnosis not present

## 2015-07-24 LAB — POCT I-STAT CREATININE: Creatinine, Ser: 0.8 mg/dL (ref 0.44–1.00)

## 2015-07-24 MED ORDER — IOHEXOL 300 MG/ML  SOLN
75.0000 mL | Freq: Once | INTRAMUSCULAR | Status: AC | PRN
  Administered 2015-07-24: 75 mL via INTRAVENOUS

## 2015-08-15 ENCOUNTER — Other Ambulatory Visit (HOSPITAL_COMMUNITY): Payer: Self-pay | Admitting: Pulmonary Disease

## 2015-08-26 ENCOUNTER — Other Ambulatory Visit (HOSPITAL_COMMUNITY): Payer: Self-pay | Admitting: Pulmonary Disease

## 2015-08-26 DIAGNOSIS — M545 Low back pain: Secondary | ICD-10-CM

## 2015-08-27 ENCOUNTER — Other Ambulatory Visit (HOSPITAL_COMMUNITY): Payer: Self-pay | Admitting: Pulmonary Disease

## 2015-08-27 DIAGNOSIS — G44201 Tension-type headache, unspecified, intractable: Secondary | ICD-10-CM

## 2015-09-11 ENCOUNTER — Ambulatory Visit (HOSPITAL_COMMUNITY)
Admission: RE | Admit: 2015-09-11 | Discharge: 2015-09-11 | Disposition: A | Discharge status: Home / Self Care | Payer: Medicare Other | Source: Ambulatory Visit | Attending: Pulmonary Disease | Admitting: Pulmonary Disease

## 2015-09-11 DIAGNOSIS — M545 Low back pain: Secondary | ICD-10-CM

## 2015-09-11 DIAGNOSIS — Z981 Arthrodesis status: Secondary | ICD-10-CM | POA: Diagnosis not present

## 2015-09-11 DIAGNOSIS — M542 Cervicalgia: Secondary | ICD-10-CM | POA: Diagnosis not present

## 2015-09-11 DIAGNOSIS — Z982 Presence of cerebrospinal fluid drainage device: Secondary | ICD-10-CM | POA: Diagnosis not present

## 2015-09-11 DIAGNOSIS — G44201 Tension-type headache, unspecified, intractable: Secondary | ICD-10-CM

## 2015-09-11 DIAGNOSIS — R51 Headache: Secondary | ICD-10-CM | POA: Insufficient documentation

## 2015-09-11 DIAGNOSIS — M5033 Other cervical disc degeneration, cervicothoracic region: Secondary | ICD-10-CM | POA: Diagnosis not present

## 2015-09-11 DIAGNOSIS — G9389 Other specified disorders of brain: Secondary | ICD-10-CM | POA: Diagnosis not present

## 2015-09-12 DIAGNOSIS — R252 Cramp and spasm: Secondary | ICD-10-CM | POA: Insufficient documentation

## 2015-09-12 DIAGNOSIS — R200 Anesthesia of skin: Secondary | ICD-10-CM | POA: Insufficient documentation

## 2015-09-12 DIAGNOSIS — M62830 Muscle spasm of back: Secondary | ICD-10-CM | POA: Insufficient documentation

## 2015-09-12 DIAGNOSIS — R29898 Other symptoms and signs involving the musculoskeletal system: Secondary | ICD-10-CM | POA: Insufficient documentation

## 2015-09-16 ENCOUNTER — Ambulatory Visit (HOSPITAL_COMMUNITY): Payer: Medicare Other

## 2015-10-01 DIAGNOSIS — M5134 Other intervertebral disc degeneration, thoracic region: Secondary | ICD-10-CM | POA: Insufficient documentation

## 2016-07-07 LAB — TSH: TSH: 1.23 (ref <=5.90)

## 2016-07-11 NOTE — Progress Notes (Deleted)
Cardiology Office Note   Date:  07/11/2016   ID:  [name], DOB [birth year], MRN [id number]  PCP:  Alonza Bogus, MD  Cardiologist:   Minus Breeding, MD  Referring:  ***  No chief complaint on file.     History of Present Illness: Amanda Webster is a 55 y.o. female who presents for ***   She saw Dr. Verl Blalock in 2013.   She had palpitations and had an event monitor with no significant arrhythmias.  ***   Past Medical History:  Diagnosis Date  . Arthritis   . Borderline diabetic   . Chronic headaches   . Colon polyps    nonadenomatous  . Depression   . Diverticulosis   . Esophageal stricture   . GERD (gastroesophageal reflux disease)   . HTN (hypertension)   . Hyperlipidemia   . IBS (irritable bowel syndrome)   . Sleep apnea     Past Surgical History:  Procedure Laterality Date  . cahri decompression  6 and 03/1999  . CHOLECYSTECTOMY    . invasive cervical traction  02/2009  . left breast-lumpectomy    . shunt put in on right side of brain    . SHUNT REPLACEMENT  12/10  . TOTAL ABDOMINAL HYSTERECTOMY    . TUBAL LIGATION       Current Outpatient Prescriptions  Medication Sig Dispense Refill  . acetaZOLAMIDE (DIAMOX) 250 MG tablet Take 250-500 mg by mouth 3 (three) times daily. Takes one tablet in the morning and two tablets at lunch and one tablet at dinner    . diclofenac (VOLTAREN) 75 MG EC tablet Take 1 tablet (75 mg total) by mouth 2 (two) times daily. 14 tablet 0  . methocarbamol (ROBAXIN) 500 MG tablet Take 1 tablet (500 mg total) by mouth 3 (three) times daily. 21 tablet 0  . naproxen sodium (ALEVE) 220 MG tablet Take 440 mg by mouth daily as needed (for pain).    . potassium chloride SA (K-DUR,KLOR-CON) 20 MEQ tablet Take 20 mEq by mouth daily.    . traMADol (ULTRAM) 50 MG tablet 1 or 2 po q6h prn pain 20 tablet 0   No current facility-administered medications for this visit.     Allergies:   Cymbalta [duloxetine hcl] and Propoxyphene  n-acetaminophen    Social History:  The patient  reports that she has quit smoking. She has never used smokeless tobacco. She reports that she does not drink alcohol or use drugs.   Family History:  The patient's ***family history includes Colon polyps in her mother.    ROS:  Please see the history of present illness.   Otherwise, review of systems are positive for {NONE DEFAULTED:18576::"none"}.   All other systems are reviewed and negative.    PHYSICAL EXAM: VS:  There were no vitals taken for this visit. , BMI There is no height or weight on file to calculate BMI. GENERAL:  Well appearing HEENT:  Pupils equal round and reactive, fundi not visualized, oral mucosa unremarkable NECK:  No jugular venous distention, waveform within normal limits, carotid upstroke brisk and symmetric, no bruits, no thyromegaly LYMPHATICS:  No cervical, inguinal adenopathy LUNGS:  Clear to auscultation bilaterally BACK:  No CVA tenderness CHEST:  Unremarkable HEART:  PMI not displaced or sustained,S1 and S2 within normal limits, no S3, no S4, no clicks, no rubs, *** murmurs ABD:  Flat, positive bowel sounds normal in frequency in pitch, no bruits, no rebound, no guarding, no midline pulsatile mass,  no hepatomegaly, no splenomegaly EXT:  2 plus pulses throughout, no edema, no cyanosis no clubbing SKIN:  No rashes no nodules NEURO:  Cranial nerves II through XII grossly intact, motor grossly intact throughout PSYCH:  Cognitively intact, oriented to person place and time    EKG:  EKG {ACTION; IS/IS [id number] ordered today. The ekg ordered today demonstrates ***   Recent Labs: 07/24/2015: Creatinine, Ser 0.80    Lipid Panel No results found for: CHOL, TRIG, HDL, CHOLHDL, VLDL, LDLCALC, LDLDIRECT    Wt Readings from Last 3 Encounters:  09/11/15 182 lb (82.6 kg)  04/17/14 182 lb (82.6 kg)  05/10/13 185 lb (83.9 kg)      Other studies Reviewed: Additional studies/ records that were reviewed  today include: ***. Review of the above records demonstrates:  Please see elsewhere in the note.  ***   ASSESSMENT AND PLAN:  ***    Current medicines are reviewed at length with the patient today.  The patient {ACTIONS; HAS/DOES NOT HAVE:19233} concerns regarding medicines.  The following changes have been made:  {PLAN; NO CHANGE:13088:s}  Labs/ tests ordered today include: *** No orders of the defined types were placed in this encounter.    Disposition:   FU with ***    Signed, Minus Breeding, MD  07/11/2016 9:37 PM    [facility]

## 2016-07-13 ENCOUNTER — Ambulatory Visit: Payer: Medicare Other | Admitting: Cardiology

## 2016-07-18 ENCOUNTER — Emergency Department (HOSPITAL_COMMUNITY): Payer: Medicare Other

## 2016-07-18 ENCOUNTER — Emergency Department (HOSPITAL_COMMUNITY)
Admission: EM | Admit: 2016-07-18 | Discharge: 2016-07-18 | Disposition: A | Discharge status: Home / Self Care | Payer: Medicare Other | Attending: Emergency Medicine | Admitting: Emergency Medicine

## 2016-07-18 ENCOUNTER — Encounter (HOSPITAL_COMMUNITY): Payer: Self-pay | Admitting: Emergency Medicine

## 2016-07-18 DIAGNOSIS — R142 Eructation: Secondary | ICD-10-CM

## 2016-07-18 DIAGNOSIS — Z79899 Other long term (current) drug therapy: Secondary | ICD-10-CM | POA: Diagnosis not present

## 2016-07-18 DIAGNOSIS — R0789 Other chest pain: Secondary | ICD-10-CM

## 2016-07-18 DIAGNOSIS — J40 Bronchitis, not specified as acute or chronic: Secondary | ICD-10-CM | POA: Insufficient documentation

## 2016-07-18 DIAGNOSIS — I10 Essential (primary) hypertension: Secondary | ICD-10-CM | POA: Insufficient documentation

## 2016-07-18 DIAGNOSIS — K219 Gastro-esophageal reflux disease without esophagitis: Secondary | ICD-10-CM | POA: Diagnosis not present

## 2016-07-18 DIAGNOSIS — Z87891 Personal history of nicotine dependence: Secondary | ICD-10-CM | POA: Diagnosis not present

## 2016-07-18 LAB — COMPREHENSIVE METABOLIC PANEL
ALK PHOS: 109 U/L (ref 38–126)
ALT: 35 U/L (ref 14–54)
AST: 31 U/L (ref 15–41)
Albumin: 4 g/dL (ref 3.5–5.0)
Anion gap: 8 (ref 5–15)
BUN: 9 mg/dL (ref 6–20)
CALCIUM: 9.2 mg/dL (ref 8.9–10.3)
CHLORIDE: 99 mmol/L — AB (ref 101–111)
CO2: 30 mmol/L (ref 22–32)
CREATININE: 0.88 mg/dL (ref 0.44–1.00)
GFR calc non Af Amer: >60 mL/min (ref >60)
Glucose, Bld: 281 mg/dL — ABNORMAL HIGH (ref 65–99)
Potassium: 3.8 mmol/L (ref 3.5–5.1)
Sodium: 137 mmol/L (ref 135–145)
Total Bilirubin: 0.4 mg/dL (ref 0.3–1.2)
Total Protein: 7 g/dL (ref 6.5–8.1)

## 2016-07-18 LAB — CBC WITH DIFFERENTIAL/PLATELET
Basophils Absolute: 0 10*3/uL (ref 0.0–0.1)
Basophils Relative: 1 %
EOS PCT: 3 %
Eosinophils Absolute: 0.2 10*3/uL (ref 0.0–0.7)
HCT: 41.6 % (ref 36.0–46.0)
Hemoglobin: 14.5 g/dL (ref 12.0–15.0)
LYMPHS ABS: 3.7 10*3/uL (ref 0.7–4.0)
Lymphocytes Relative: 46 %
MCH: 30.3 pg (ref 26.0–34.0)
MCHC: 34.9 g/dL (ref 30.0–36.0)
MCV: 87 fL (ref 78.0–100.0)
MONO ABS: 0.6 10*3/uL (ref 0.1–1.0)
Monocytes Relative: 8 %
Neutro Abs: 3.3 10*3/uL (ref 1.7–7.7)
Neutrophils Relative %: 42 %
PLATELETS: 185 10*3/uL (ref 150–400)
RBC: 4.78 MIL/uL (ref 3.87–5.11)
RDW: 12.5 % (ref 11.5–15.5)
WBC: 7.9 10*3/uL (ref 4.0–10.5)

## 2016-07-18 LAB — TROPONIN I: Troponin I: <0.03 ng/mL (ref <0.03)

## 2016-07-18 MED ORDER — AZITHROMYCIN 250 MG PO TABS: ORAL_TABLET | ORAL | 0 refills | Status: DC

## 2016-07-18 MED ORDER — DM-GUAIFENESIN ER 30-600 MG PO TB12
1.0000 | ORAL_TABLET | Freq: Two times a day (BID) | ORAL | Status: DC
  Administered 2016-07-18: 1 via ORAL
  Filled 2016-07-18: qty 1

## 2016-07-18 MED ORDER — FAMOTIDINE 20 MG PO TABS
20.0000 mg | ORAL_TABLET | Freq: Once | ORAL | Status: AC
  Administered 2016-07-18: 20 mg via ORAL
  Filled 2016-07-18: qty 1

## 2016-07-18 MED ORDER — GI COCKTAIL ~~LOC~~
30.0000 mL | Freq: Once | ORAL | Status: AC
  Administered 2016-07-18: 30 mL via ORAL
  Filled 2016-07-18: qty 30

## 2016-07-18 MED ORDER — OMEPRAZOLE 20 MG PO CPDR: DELAYED_RELEASE_CAPSULE | ORAL | 0 refills | Status: DC

## 2016-07-18 MED ORDER — NAPROXEN 250 MG PO TABS
500.0000 mg | ORAL_TABLET | Freq: Once | ORAL | Status: AC
  Administered 2016-07-18: 500 mg via ORAL
  Filled 2016-07-18: qty 2

## 2016-07-18 NOTE — ED Triage Notes (Signed)
Pt states that she has been having significant indigestion for the past 6 months with recurrent pressure and burning into throat.  Started having pressure yesterday and coughing.  Was suppose to see cardiology this past week but was unable to make it to the visit

## 2016-07-18 NOTE — Discharge Instructions (Addendum)
Use ice and heat on your chest for comfort. Take the prilosec as prescribed. You can take the naproxen for your chest wall pain. Take mucinex DM OTC for your cough. Take the antibiotic until gone. You can take 2 aleve twice a day for your chest wall pain. Keep your appointment with cardiology and talk to Dr Luan Pulling about getting an gastroenterology evaluation.

## 2016-07-18 NOTE — ED Provider Notes (Signed)
[facility] DEPT Provider Note   CSN: [id number] Arrival date & time: 07/18/16  0444  Time seen 05:15 AM   History   Chief Complaint Chief Complaint  Patient presents with  . Chest Pain    HPI Amanda Webster is a 55 y.o. female.  HPI  Patient presents with multiple complaints and she is hard to keep focused during her interview. She relates about a week ago she started having a sore throat and said it was painful to swallow. The following day she started having sneezing and the day after that she started coughing. She denies fever but states she has chills. She denies wheezing but states she feels short of breath. She states a few days ago she started getting pain in the center of her chest that she describes as tightness and pressure. She states it's there constantly and feels worse when she coughs. She also reports coughing makes her head feel like is going to explode. She states nothing makes the chest pain feel better. She has taken Robitussin, Aleve, and cough drops without relief. She states she feels like she gets a burning up into her throat but it doesn't come up into her oropharynx. She also reports a lot of belching. She reports she's been having some of these symptoms for a long time and her PCP referred her to cardiology last week however she missed the appointment that she was feeling bad. She has a rescheduled appointment on November 21.  Patient also states she felt suicidal a week ago. She states she used to see a psychiatrist and therapist at [facility] but she doesn't now. She states that she has chronic pain" my life is held because of the life I have chosen for myself". She states that is because she does not want to take pain medication. Her husband who is in the room was aware of her having the suicidal thoughts last week however he and she both now agree that she is no longer suicidal and they do not feel like she is at a risk of hurting herself.  Past Medical  History:  Diagnosis Date  . Arthritis   . Borderline diabetic   . Chronic headaches   . Colon polyps    nonadenomatous  . Depression   . Diverticulosis   . Esophageal stricture   . GERD (gastroesophageal reflux disease)   . HTN (hypertension)   . Hyperlipidemia   . IBS (irritable bowel syndrome)   . Sleep apnea     Patient Active Problem List   Diagnosis Date Noted  . Chest pain at rest 03/08/2011  . Obesity 03/08/2011  . FROZEN LEFT SHOULDER 04/01/2010  . SLEEP APNEA 04/01/2010  . DIARRHEA, PERSISTENT 05/05/2009  . ABDOMINAL PAIN, RUQ 05/05/2009    Past Surgical History:  Procedure Laterality Date  . cahri decompression  6 and 03/1999  . CHOLECYSTECTOMY    . invasive cervical traction  02/2009  . left breast-lumpectomy    . shunt put in on right side of brain    . SHUNT REPLACEMENT  12/10  . TOTAL ABDOMINAL HYSTERECTOMY    . TUBAL LIGATION      OB History    No data available       Home Medications    Prior to Admission medications   Medication Sig Start Date End Date Taking? Authorizing Provider  acetaZOLAMIDE (DIAMOX) 250 MG tablet Take 250-500 mg by mouth 3 (three) times daily. Takes one tablet in the morning and two tablets  at lunch and one tablet at dinner    Historical Provider, MD  azithromycin (ZITHROMAX) 250 MG tablet Take 2 po the first day then once a day for the next 4 days. 07/18/16   Rolland Porter, MD  diclofenac (VOLTAREN) 75 MG EC tablet Take 1 tablet (75 mg total) by mouth 2 (two) times daily. 04/17/14   Lily Kocher, PA-C  methocarbamol (ROBAXIN) 500 MG tablet Take 1 tablet (500 mg total) by mouth 3 (three) times daily. 04/17/14   Lily Kocher, PA-C  naproxen sodium (ALEVE) 220 MG tablet Take 440 mg by mouth daily as needed (for pain).    Historical Provider, MD  omeprazole (PRILOSEC) 20 MG capsule Take 1 po BID x 2 weeks then once a day 07/18/16   Rolland Porter, MD  potassium chloride SA (K-DUR,KLOR-CON) 20 MEQ tablet Take 20 mEq by mouth daily.     Historical Provider, MD  traMADol Veatrice Bourbon) 50 MG tablet 1 or 2 po q6h prn pain 04/17/14   Lily Kocher, PA-C    Family History Family History  Problem Relation Age of Onset  . Colon polyps Mother   . Colon cancer Neg Hx     Social History Social History  Substance Use Topics  . Smoking status: Former Research scientist (life sciences)  . Smokeless tobacco: Never Used  . Alcohol use No  lives at home Lives with spouse On disability after chiari malformation surgery 15 years ago "that was botched up"   Allergies   Cymbalta [duloxetine hcl] and Propoxyphene n-acetaminophen   Review of Systems Review of Systems  All other systems reviewed and are negative.    Physical Exam Updated Vital Signs BP 151/83 (BP Location: Right Arm)   Pulse 79   Temp 98.3 F (36.8 C) (Oral)   Resp 18   Ht 5\' 2"  (1.575 m)   Wt 188 lb (85.3 kg)   SpO2 96%   BMI 34.39 kg/m   Vital signs normal    Physical Exam  Constitutional: She is oriented to person, place, and time. She appears well-developed and well-nourished.  Non-toxic appearance. She does not appear ill. No distress.  Patient is constantly ingesting air and belching.  HENT:  Head: Normocephalic and atraumatic.  Right Ear: External ear normal.  Left Ear: External ear normal.  Nose: Nose normal. No mucosal edema or rhinorrhea.  Mouth/Throat: Oropharynx is clear and moist and mucous membranes are normal. No dental abscesses or uvula swelling.  Eyes: Conjunctivae and EOM are normal. Pupils are equal, round, and reactive to light.  Neck: Normal range of motion and full passive range of motion without pain. Neck supple.  Cardiovascular: Normal rate, regular rhythm and normal heart sounds.  Exam reveals no gallop and no friction rub.   No murmur heard. Pulmonary/Chest: Effort normal and breath sounds normal. No respiratory distress. She has no wheezes. She has no rhonchi. She has no rales. She exhibits tenderness. She exhibits no crepitus.    Patient has  tenderness to palpation along her upper chest wall and costochondral junctions  Abdominal: Soft. Normal appearance and bowel sounds are normal. She exhibits no distension. There is no tenderness. There is no rebound and no guarding.  Musculoskeletal: Normal range of motion. She exhibits no edema or tenderness.  Moves all extremities well.   Neurological: She is alert and oriented to person, place, and time. She has normal strength. No cranial nerve deficit.  Skin: Skin is warm, dry and intact. No rash noted. No erythema. No pallor.  Psychiatric: She  has a normal mood and affect. Her speech is normal and behavior is normal. Her mood appears not anxious.  Nursing note and vitals reviewed.    ED Treatments / Results  Labs (all labs ordered are listed, but only abnormal results are displayed) Results for orders placed or performed during the hospital encounter of 07/18/16  Comprehensive metabolic panel  Result Value Ref Range   Sodium 137 135 - 145 mmol/L   Potassium 3.8 3.5 - 5.1 mmol/L   Chloride 99 (L) 101 - 111 mmol/L   CO2 30 22 - 32 mmol/L   Glucose, Bld 281 (H) 65 - 99 mg/dL   BUN 9 6 - 20 mg/dL   Creatinine, Ser 0.88 0.44 - 1.00 mg/dL   Calcium 9.2 8.9 - 10.3 mg/dL   Total Protein 7.0 6.5 - 8.1 g/dL   Albumin 4.0 3.5 - 5.0 g/dL   AST 31 15 - 41 U/L   ALT 35 14 - 54 U/L   Alkaline Phosphatase 109 38 - 126 U/L   Total Bilirubin 0.4 0.3 - 1.2 mg/dL   GFR calc non Af Amer >60 >60 mL/min   GFR calc Af Amer >60 >60 mL/min   Anion gap 8 5 - 15  Troponin I  Result Value Ref Range   Troponin I <0.03 <0.03 ng/mL  CBC with Differential  Result Value Ref Range   WBC 7.9 4.0 - 10.5 K/uL   RBC 4.78 3.87 - 5.11 MIL/uL   Hemoglobin 14.5 12.0 - 15.0 g/dL   HCT 41.6 36.0 - 46.0 %   MCV 87.0 78.0 - 100.0 fL   MCH 30.3 26.0 - 34.0 pg   MCHC 34.9 30.0 - 36.0 g/dL   RDW 12.5 11.5 - 15.5 %   Platelets 185 150 - 400 K/uL   Neutrophils Relative % 42 %   Neutro Abs 3.3 1.7 - 7.7 K/uL    Lymphocytes Relative 46 %   Lymphs Abs 3.7 0.7 - 4.0 K/uL   Monocytes Relative 8 %   Monocytes Absolute 0.6 0.1 - 1.0 K/uL   Eosinophils Relative 3 %   Eosinophils Absolute 0.2 0.0 - 0.7 K/uL   Basophils Relative 1 %   Basophils Absolute 0.0 0.0 - 0.1 K/uL   Laboratory interpretation all normal except hyperglycemia    EKG  EKG Interpretation  Date/Time:  Sunday July 18 2016 04:56:12 EST Ventricular Rate:  75 PR Interval:    QRS Duration: 80 QT Interval:  383 QTC Calculation: 428 R Axis:   42 Text Interpretation:  Sinus rhythm Low voltage, precordial leads Since last tracing 31 Dec 2007 T wave amplitude has decreased in diffuse leads Confirmed by Camryn Lampson  MD-I, Demonta Wombles ([id number]) on 07/18/2016 5:00:39 AM       Radiology Dg Chest 2 View  Result Date: 07/18/2016 CLINICAL DATA:  Chest pain and coughing EXAM: CHEST  2 VIEW COMPARISON:  Chest radiograph 02/17/2015 FINDINGS: Cardiomediastinal contours are normal. No pneumothorax or pleural effusion. No focal airspace consolidation or pulmonary edema. ACDF noted. IMPRESSION: Clear lungs. Electronically Signed   By: Ulyses Jarred M.D.   On: 07/18/2016 06:16    Procedures Procedures (including critical care time)  Medications Ordered in ED Medications  dextromethorphan-guaiFENesin (MUCINEX DM) 30-600 MG per 12 hr tablet 1 tablet (1 tablet Oral Given 07/18/16 0623)  famotidine (PEPCID) tablet 20 mg (20 mg Oral Given 07/18/16 0529)  gi cocktail (Maalox,Lidocaine,Donnatal) (30 mLs Oral Given 07/18/16 0529)  naproxen (NAPROSYN) tablet 500 mg (500 mg Oral Given  07/18/16 [id number])     Initial Impression / Assessment and Plan / ED Course  I have reviewed the triage vital signs and the nursing notes.  Pertinent labs & imaging results that were available during my care of the patient were reviewed by me and considered in my medical decision making (see chart for details).  Clinical Course    Patient was given Pepcid and a GI cocktail for her  complaints of possible GERD.  Recheck at 06:45 AM pt still having purposeful belching. We discussed her test results. She again confirms she has had constant chest pain for several days. We discussed she has bronchitis and has chest wall pain from the coughing. She was advised to take Mucinex DM over-the-counter for her cough. She was put on a Z-Pak and for her reflux symptoms and belching she was placed on Prilosec. She was encouraged to keep her cardiology appointment and to talk to her primary care about referral to gastroenterologist.  Final Clinical Impressions(s) / ED Diagnoses   Final diagnoses:  Chest wall pain  Bronchitis  Gastroesophageal reflux disease without esophagitis  Belching    New Prescriptions New Prescriptions   AZITHROMYCIN (ZITHROMAX) 250 MG TABLET    Take 2 po the first day then once a day for the next 4 days.   OMEPRAZOLE (PRILOSEC) 20 MG CAPSULE    Take 1 po BID x 2 weeks then once a day    Plan discharge  Rolland Porter, MD, Barbette Or, MD 07/18/16 [phone]

## 2016-07-18 NOTE — ED Notes (Signed)
Per Dr. Tomi Bamberger, this pt states she was suicidal a week ago. Pt denies SI at this time to Dr. Tomi Bamberger. Per Dr. Tomi Bamberger, the pt's husband is in the room and stated that he knew his wife felt suicidal last week and she denies being SI now. Per Dr. Tomi Bamberger, she does not feel as if this pt is at risk and neither does the pt or her husband.

## 2016-07-20 ENCOUNTER — Encounter: Payer: Self-pay | Admitting: Nutrition

## 2016-07-20 ENCOUNTER — Telehealth: Payer: Self-pay | Admitting: Nutrition

## 2016-07-20 ENCOUNTER — Encounter: Payer: Medicare Other | Attending: Pulmonary Disease | Admitting: Nutrition

## 2016-07-20 VITALS — Ht 62.0 in | Wt 191.0 lb

## 2016-07-20 DIAGNOSIS — E118 Type 2 diabetes mellitus with unspecified complications: Secondary | ICD-10-CM

## 2016-07-20 DIAGNOSIS — E119 Type 2 diabetes mellitus without complications: Secondary | ICD-10-CM | POA: Diagnosis present

## 2016-07-20 DIAGNOSIS — IMO0002 Reserved for concepts with insufficient information to code with codable children: Secondary | ICD-10-CM

## 2016-07-20 DIAGNOSIS — Z713 Dietary counseling and surveillance: Secondary | ICD-10-CM | POA: Diagnosis not present

## 2016-07-20 DIAGNOSIS — E669 Obesity, unspecified: Secondary | ICD-10-CM | POA: Insufficient documentation

## 2016-07-20 DIAGNOSIS — E1165 Type 2 diabetes mellitus with hyperglycemia: Secondary | ICD-10-CM

## 2016-07-20 NOTE — Patient Instructions (Addendum)
Goals 1. Follow Plate Method 2. Eat 30 -45 grams of carbs per meal 3. Increase low carb vegetables to 2 servings per meal 4. Drink only water and cut out sodas and juice 5. Do not skip meals 6. Cut out processed foods Think of food as your medicine and eat fresh wholesome foods. Lose 1-2 lbs per week. Test blood sugar in am and before bed daily. Talk to Dr. Luan Pulling about increasing your Metformin to 1000 mg BID to improve blood sugars.

## 2016-07-20 NOTE — Progress Notes (Signed)
  Medical Nutrition Therapy:  Appt start time: 1500 end time:  1600.  Assessment:  Primary concerns today Diabetes Type 2. PMH Arnold Chiari Malformation, anxiety, depression, . Has bronchitis right now.  Went to hospital recently for chest pains and had bronchtist. BS was 280 mg/d in ER the other day she reports. Doesn't know how much metformin she is taking. H&P says 500 mg BID. Forgets it sometimes. SKips meals and only eats 1-2 times per day depending on how she feels. Is in pain often but avoids pain meds. Likes to crochet. Not taking other meds right now. Doesn't like to take pain meds.Test blood sugar once a day randomly.; 140-160 mg/dl   Diet is inconsistent to meet her nutritional needs.    A1C 8.9% 06/2016 at Dr. Luan Pulling office.   Previously it was 10.8% 03/2016.  Preferred Learning Style:   No preference indicated   Learning Readiness:   Ready  Change in progress  MEDICATIONS:see list  DIETARY INTAKE    24-hr recall:  B ( AM): cereal- honey smacks with whole milk or skips  Snk ( AM):   L ( PM): ham or bologna sandwich, Orange juice or Mtn Dew  Snk ( PM):  D ( PM): Chicken, potatoes and biscuit 1, Water Snk ( PM):  Beverages: water, soda, juice  Usual physical activity: ADL  Estimated energy needs: 1500 calories 170 g carbohydrates 112 g protein 42 g fat  Progress Towards Goal(s):  In progress.   Nutritional Diagnosis:  NB-1.1 Food and nutrition-related knowledge deficit As related to Diabetes .  As evidenced by A1C > 7%.    Intervention:  Nutrition and Diabetes education provided on My Plate, CHO counting, meal planning, portion sizes, timing of meals, avoiding snacks between meals unless having a low blood sugar, target ranges for A1C and blood sugars, signs/symptoms and treatment of hyper/hypoglycemia, monitoring blood sugars, taking medications as prescribed, benefits of exercising 30 minutes per day and prevention of complications of DM.  Goals 1. Follow  Plate Method 2. Eat 30 -45 grams of carbs per meal 3. Increase low carb vegetables to 2 servings per meal 4. Drink only water and cut out sodas and juice 5. Do not skip meals 6. Cut out processed foods Think of food as your medicine and eat fresh wholesome foods. Lose 1-2 lbs per week.  Teaching Method Utilized:  Visual Auditory Hands on  Handouts given during visit include:  The Plate Method  Meal Plan Card  Diabetes Instructions.  Barriers to learning/adherence to lifestyle change:  None  Demonstrated degree of understanding via:  Teach Back   Monitoring/Evaluation:  Dietary intake, exercise, meal planning, SBG, and body weight in 1 month(s).    >> Recommend to increase Metformin to 1000 mg BID to maximize benefit to improve blood sugar.  Also going to get her to test blood sugar in am and bedtime daily for 1 month. May need new prescription for strips.

## 2016-07-20 NOTE — Telephone Encounter (Signed)
TC to patient to notify her of the lab results obtained from Dr. Luan Pulling office. A1C 8.9%. (07/07/16)  She verbalized understanding.

## 2016-08-03 ENCOUNTER — Encounter: Payer: Self-pay | Admitting: Cardiology

## 2016-08-03 ENCOUNTER — Ambulatory Visit: Payer: Medicare Other | Admitting: Cardiology

## 2016-08-03 NOTE — Progress Notes (Deleted)
Clinical Summary Ms. Crisman is a 55 y.o.female seen as a new patient.  1. Chest pain - seen in ER 07/2016 with chest pain - recent cold like symptoms, along with constant chest pain worst with coughing. Heavy belching - CXR no acute process, trop neg. EKG    Past Medical History:  Diagnosis Date  . Arnold-Chiari malformation ([place])   . Arthritis   . Borderline diabetic   . Chronic headaches   . Colon polyps    nonadenomatous  . Depression   . Diverticulosis   . Esophageal stricture   . GERD (gastroesophageal reflux disease)   . HTN (hypertension)   . Hyperlipidemia   . IBS (irritable bowel syndrome)   . Sleep apnea      Allergies  Allergen Reactions  . Cymbalta [Duloxetine Hcl] Itching    Nasal itching  . Propoxyphene N-Acetaminophen Itching     Current Outpatient Prescriptions  Medication Sig Dispense Refill  . acetaZOLAMIDE (DIAMOX) 250 MG tablet Take 250-500 mg by mouth 3 (three) times daily. Takes one tablet in the morning and two tablets at lunch and one tablet at dinner    . azithromycin (ZITHROMAX) 250 MG tablet Take 2 po the first day then once a day for the next 4 days. 6 tablet 0  . diclofenac (VOLTAREN) 75 MG EC tablet Take 1 tablet (75 mg total) by mouth 2 (two) times daily. (Patient not taking: Reported on 07/20/2016) 14 tablet 0  . metFORMIN (GLUCOPHAGE) 500 MG tablet Take 500 mg by mouth 2 (two) times daily with a meal.    . methocarbamol (ROBAXIN) 500 MG tablet Take 1 tablet (500 mg total) by mouth 3 (three) times daily. (Patient not taking: Reported on 07/20/2016) 21 tablet 0  . naproxen sodium (ALEVE) 220 MG tablet Take 440 mg by mouth daily as needed (for pain).    Marland Kitchen omeprazole (PRILOSEC) 20 MG capsule Take 1 po BID x 2 weeks then once a day (Patient not taking: Reported on 07/20/2016) 60 capsule 0  . potassium chloride SA (K-DUR,KLOR-CON) 20 MEQ tablet Take 20 mEq by mouth daily.    . traMADol (ULTRAM) 50 MG tablet 1 or 2 po q6h prn pain (Patient  not taking: Reported on 07/20/2016) 20 tablet 0   No current facility-administered medications for this visit.      Past Surgical History:  Procedure Laterality Date  . cahri decompression  6 and 03/1999  . CHOLECYSTECTOMY    . invasive cervical traction  02/2009  . left breast-lumpectomy    . shunt put in on right side of brain    . SHUNT REPLACEMENT  12/10  . TOTAL ABDOMINAL HYSTERECTOMY    . TUBAL LIGATION       Allergies  Allergen Reactions  . Cymbalta [Duloxetine Hcl] Itching    Nasal itching  . Propoxyphene N-Acetaminophen Itching      Family History  Problem Relation Age of Onset  . Colon polyps Mother   . Colon cancer Neg Hx      Social History Ms. Lisowski reports that she has quit smoking. She has never used smokeless tobacco. Ms. Sedberry reports that she does not drink alcohol.   Review of Systems CONSTITUTIONAL: No weight loss, fever, chills, weakness or fatigue.  HEENT: Eyes: No visual loss, blurred vision, double vision or yellow sclerae.No hearing loss, sneezing, congestion, runny nose or sore throat.  SKIN: No rash or itching.  CARDIOVASCULAR:  RESPIRATORY: No shortness of breath, cough or sputum.  GASTROINTESTINAL: No anorexia, nausea, vomiting or diarrhea. No abdominal pain or blood.  GENITOURINARY: No burning on urination, no polyuria NEUROLOGICAL: No headache, dizziness, syncope, paralysis, ataxia, numbness or tingling in the extremities. No change in bowel or bladder control.  MUSCULOSKELETAL: No muscle, back pain, joint pain or stiffness.  LYMPHATICS: No enlarged nodes. No history of splenectomy.  PSYCHIATRIC: No history of depression or anxiety.  ENDOCRINOLOGIC: No reports of sweating, cold or heat intolerance. No polyuria or polydipsia.  Marland Kitchen   Physical Examination There were no vitals filed for this visit. There were no vitals filed for this visit.  Gen: resting comfortably, no acute distress HEENT: no scleral icterus, pupils equal round  and reactive, no palptable cervical adenopathy,  CV Resp: Clear to auscultation bilaterally GI: abdomen is soft, non-tender, non-distended, normal bowel sounds, no hepatosplenomegaly MSK: extremities are warm, no edema.  Skin: warm, no rash Neuro:  no focal deficits Psych: appropriate affect   Diagnostic Studies     Assessment and Plan        Arnoldo Lenis, M.D., F.A.C.C.

## 2016-08-04 ENCOUNTER — Ambulatory Visit (INDEPENDENT_AMBULATORY_CARE_PROVIDER_SITE_OTHER): Payer: Medicare Other | Admitting: Cardiology

## 2016-08-04 ENCOUNTER — Encounter: Payer: Self-pay | Admitting: Cardiology

## 2016-08-04 ENCOUNTER — Encounter: Payer: Self-pay | Admitting: *Deleted

## 2016-08-04 VITALS — BP 130/85 | HR 71 | Ht 62.0 in | Wt 187.0 lb

## 2016-08-04 DIAGNOSIS — R079 Chest pain, unspecified: Secondary | ICD-10-CM | POA: Diagnosis not present

## 2016-08-04 NOTE — Patient Instructions (Signed)
Your physician recommends that you schedule a follow-up appointment in: TO BE DETERMINED AFTER TESTING   Your physician recommends that you continue on your current medications as directed. Please refer to the Current Medication list given to you today.  Your physician has requested that you have a lexiscan myoview. For further information please visit [email or web address]. Please follow instruction sheet, as given.  Thank you for choosing [facility]!!    

## 2016-08-04 NOTE — Progress Notes (Signed)
Clinical Summary Amanda Webster is a 55 y.o.female seen as a new consult, she is referred by Dr Luan Pulling.   1. Chest pain - seen in ER 07/18/16 with CP - thought to be chestwall pain from bronchitis, coughing. Given z-pak. Prilosec given for GERD symptoms.   - pain started several months ago. Started a pressure, midchest. 5-6/10 in severity. Can have some diaphoresis. Can have some SOB. Occurs several times a week. Not positional. No relation to food. Symptoms can last up to 1 hour. Can occur at anytime. - can have frequent belching - DOE at 1 block, which is stable.   CAD risk factors: DM2, previous history of HTN now off meds, ?HL, +tobacco in the past about 18 years total, father MIs late 76s to early 67s - cannot run on treadmill due to chronic back pain.    2. Palpitations - only occurs with laying on left side  Past Medical History:  Diagnosis Date  . Arnold-Chiari malformation ([place])   . Arthritis   . Borderline diabetic   . Chronic headaches   . Colon polyps    nonadenomatous  . Depression   . Diverticulosis   . Esophageal stricture   . GERD (gastroesophageal reflux disease)   . HTN (hypertension)   . Hyperlipidemia   . IBS (irritable bowel syndrome)   . Sleep apnea      Allergies  Allergen Reactions  . Cymbalta [Duloxetine Hcl] Itching    Nasal itching  . Propoxyphene N-Acetaminophen Itching     Current Outpatient Prescriptions  Medication Sig Dispense Refill  . acetaZOLAMIDE (DIAMOX) 250 MG tablet Take 250-500 mg by mouth 3 (three) times daily. Takes one tablet in the morning and two tablets at lunch and one tablet at dinner    . azithromycin (ZITHROMAX) 250 MG tablet Take 2 po the first day then once a day for the next 4 days. 6 tablet 0  . diclofenac (VOLTAREN) 75 MG EC tablet Take 1 tablet (75 mg total) by mouth 2 (two) times daily. (Patient not taking: Reported on 07/20/2016) 14 tablet 0  . metFORMIN (GLUCOPHAGE) 500 MG tablet Take 500 mg by mouth 2  (two) times daily with a meal.    . methocarbamol (ROBAXIN) 500 MG tablet Take 1 tablet (500 mg total) by mouth 3 (three) times daily. (Patient not taking: Reported on 07/20/2016) 21 tablet 0  . naproxen sodium (ALEVE) 220 MG tablet Take 440 mg by mouth daily as needed (for pain).    Marland Kitchen omeprazole (PRILOSEC) 20 MG capsule Take 1 po BID x 2 weeks then once a day (Patient not taking: Reported on 07/20/2016) 60 capsule 0  . potassium chloride SA (K-DUR,KLOR-CON) 20 MEQ tablet Take 20 mEq by mouth daily.    . traMADol (ULTRAM) 50 MG tablet 1 or 2 po q6h prn pain (Patient not taking: Reported on 07/20/2016) 20 tablet 0   No current facility-administered medications for this visit.      Past Surgical History:  Procedure Laterality Date  . cahri decompression  6 and 03/1999  . CHOLECYSTECTOMY    . invasive cervical traction  02/2009  . left breast-lumpectomy    . shunt put in on right side of brain    . SHUNT REPLACEMENT  12/10  . TOTAL ABDOMINAL HYSTERECTOMY    . TUBAL LIGATION       Allergies  Allergen Reactions  . Cymbalta [Duloxetine Hcl] Itching    Nasal itching  . Propoxyphene N-Acetaminophen Itching  Family History  Problem Relation Age of Onset  . Colon polyps Mother   . Colon cancer Neg Hx      Social History Amanda Webster reports that she has quit smoking. She has never used smokeless tobacco. Amanda Webster reports that she does not drink alcohol.   Review of Systems CONSTITUTIONAL: No weight loss, fever, chills, weakness or fatigue.  HEENT: Eyes: No visual loss, blurred vision, double vision or yellow sclerae.No hearing loss, sneezing, congestion, runny nose or sore throat.  SKIN: No rash or itching.  CARDIOVASCULAR: per HPI RESPIRATORY: No shortness of breath, cough or sputum.  GASTROINTESTINAL: No anorexia, nausea, vomiting or diarrhea. No abdominal pain or blood.  GENITOURINARY: No burning on urination, no polyuria NEUROLOGICAL: No headache, dizziness, syncope,  paralysis, ataxia, numbness or tingling in the extremities. No change in bowel or bladder control.  MUSCULOSKELETAL: No muscle, back pain, joint pain or stiffness.  LYMPHATICS: No enlarged nodes. No history of splenectomy.  PSYCHIATRIC: No history of depression or anxiety.  ENDOCRINOLOGIC: No reports of sweating, cold or heat intolerance. No polyuria or polydipsia.  Marland Kitchen   Physical Examination Vitals:   08/04/16 0937  BP: 130/85  Pulse: 71   Vitals:   08/04/16 0937  Weight: 187 lb (84.8 kg)  Height: 5\' 2"  (1.575 m)    Gen: resting comfortably, no acute distress HEENT: no scleral icterus, pupils equal round and reactive, no palptable cervical adenopathy,  CV: RRR, no m/rg no jvd Resp: Clear to auscultation bilaterally GI: abdomen is soft, non-tender, non-distended, normal bowel sounds, no hepatosplenomegaly MSK: extremities are warm, no edema.  Skin: warm, no rash Neuro:  no focal deficits Psych: appropriate affect      Assessment and Plan  1. Chest pain - unclear etiology, she has multiple CAD risk factors - we will obtain a lexiscan to further evalaute   F/u pending stress results      Arnoldo Lenis, M.D.

## 2016-08-16 ENCOUNTER — Encounter (HOSPITAL_COMMUNITY): Payer: Self-pay

## 2016-08-16 ENCOUNTER — Inpatient Hospital Stay (HOSPITAL_COMMUNITY): Admission: RE | Admit: 2016-08-16 | Payer: Medicare Other | Source: Ambulatory Visit

## 2016-08-16 ENCOUNTER — Encounter (HOSPITAL_COMMUNITY)
Admission: RE | Admit: 2016-08-16 | Discharge: 2016-08-16 | Disposition: A | Discharge status: Home / Self Care | Payer: Medicare Other | Source: Ambulatory Visit | Attending: Cardiology | Admitting: Cardiology

## 2016-08-16 DIAGNOSIS — R079 Chest pain, unspecified: Secondary | ICD-10-CM | POA: Diagnosis not present

## 2016-08-16 HISTORY — DX: Type 2 diabetes mellitus without complications: E11.9

## 2016-08-16 LAB — NM MYOCAR MULTI W/SPECT W/WALL MOTION / EF
CHL CUP RESTING HR STRESS: 63 {beats}/min
CSEPPHR: 123 {beats}/min
LV dias vol: 51 mL (ref 46–106)
LVSYSVOL: 10 mL
RATE: 0.34
SDS: 0
SRS: 0
SSS: 0
TID: 0.73

## 2016-08-16 MED ORDER — TECHNETIUM TC 99M TETROFOSMIN IV KIT
10.0000 | PACK | Freq: Once | INTRAVENOUS | Status: AC | PRN
  Administered 2016-08-16: 10.6 via INTRAVENOUS

## 2016-08-16 MED ORDER — TECHNETIUM TC 99M TETROFOSMIN IV KIT
30.0000 | PACK | Freq: Once | INTRAVENOUS | Status: AC | PRN
Start: 2016-08-16 — End: 2016-08-16
  Administered 2016-08-16: 31 via INTRAVENOUS

## 2016-08-16 MED ORDER — SODIUM CHLORIDE 0.9% FLUSH
INTRAVENOUS | Status: AC
  Administered 2016-08-16: 10 mL via INTRAVENOUS
  Filled 2016-08-16: qty 10

## 2016-08-16 MED ORDER — REGADENOSON 0.4 MG/5ML IV SOLN
INTRAVENOUS | Status: AC
  Administered 2016-08-16: 0.4 mg via INTRAVENOUS
  Filled 2016-08-16: qty 5

## 2016-08-19 ENCOUNTER — Encounter: Payer: Self-pay | Admitting: Nutrition

## 2016-08-19 ENCOUNTER — Telehealth: Payer: Self-pay | Admitting: *Deleted

## 2016-08-19 ENCOUNTER — Encounter: Payer: Medicare Other | Attending: Pulmonary Disease | Admitting: Nutrition

## 2016-08-19 VITALS — Ht 62.0 in | Wt 184.0 lb

## 2016-08-19 DIAGNOSIS — E669 Obesity, unspecified: Secondary | ICD-10-CM

## 2016-08-19 DIAGNOSIS — E1165 Type 2 diabetes mellitus with hyperglycemia: Secondary | ICD-10-CM

## 2016-08-19 DIAGNOSIS — E118 Type 2 diabetes mellitus with unspecified complications: Principal | ICD-10-CM

## 2016-08-19 DIAGNOSIS — IMO0002 Reserved for concepts with insufficient information to code with codable children: Secondary | ICD-10-CM

## 2016-08-19 NOTE — Telephone Encounter (Signed)
-----   Message from Arnoldo Lenis, MD sent at 08/17/2016 11:29 AM EST ----- Normal stress test, her symptoms do not appear to be heart related. She needs to f/u with her pcp to discuss noncardiac causes of chest pain. Can f/u with [place] in 2 months to reevaluate.   Zandra Abts MD

## 2016-08-19 NOTE — Patient Instructions (Addendum)
Goals 1. Keep up the great job!!! Add more low carb vegetables with lunch and dinner Keep walking Eat 2-3 carb choices per meal Get A1C down to 7%

## 2016-08-19 NOTE — Progress Notes (Signed)
  Medical Nutrition Therapy:  Appt start time: 1500 end time:  [id number].  Assessment:  Primary concerns today Diabetes Type 2. PMH Arnold Chiari Malformation, anxiety, depression,  Metforrmin 500 mg BID.Marland Kitchen Lost 3 lbs.  Hasn't seen Dr. Luan Pulling yet.  Has an accucheck meter.. Changed:  Has increase physical actvity> Feels a lot better.   Eating meals on time Sleeping much better now.  Has cut out sodas now. Eating more low carb vegetables.  Drinking more water.   Previously A1C was 10.8% 03/2016.  Preferred Learning Style:   No preference indicated   Learning Readiness:   Ready  Change in progress  MEDICATIONS:see list  DIETARY INTAKE    24-hr recall:  B ( AM): Broccoli and PB crackers; 10 grapes Snk ( AM):   L ( PM): [place] sandwich on flatbread, 10 grapes, water Snk ( PM):  D ( PM):  Chili beans and 7 tortilla chips,  Snk ( PM):  Beverages: water, soda, juice  Usual physical activity: ADL  Estimated energy needs: 1500 calories 170 g carbohydrates 112 g protein 42 g fat  Progress Towards Goal(s):  In progress.   Nutritional Diagnosis:  NB-1.1 Food and nutrition-related knowledge deficit As related to Diabetes .  As evidenced by A1C > 7%.    Intervention:  Nutrition and Diabetes education provided on My Plate, CHO counting, meal planning, portion sizes, timing of meals, avoiding snacks between meals unless having a low blood sugar, target ranges for A1C and blood sugars, signs/symptoms and treatment of hyper/hypoglycemia, monitoring blood sugars, taking medications as prescribed, benefits of exercising 30 minutes per day and prevention of complications of DM.  Goals 1. Keep up the great job!!! Add more low carb vegetables with lunch and dinner Keep walking Eat 2-3 carb choices per meal Get A1C down to 7%  Teaching Method Utilized:  Visual Auditory Hands on  Handouts given during visit include:  The Plate Method  Meal Plan Card  Diabetes Instructions.  Barriers  to learning/adherence to lifestyle change:  None  Demonstrated degree of understanding via:  Teach Back   Monitoring/Evaluation:  Dietary intake, exercise, meal planning, SBG, and body weight in 3  month(s).    >> Recommend to increase Metformin to 1000 mg BID to maximize benefit to improve blood sugar.  Also going to get her to test blood sugar in am and bedtime daily for 1 month. May need new prescription for strips.

## 2016-08-19 NOTE — Telephone Encounter (Signed)
Pt aware scheduled f/u appt - routed to pcp  

## 2016-09-15 ENCOUNTER — Other Ambulatory Visit (HOSPITAL_COMMUNITY): Payer: Self-pay | Admitting: Pulmonary Disease

## 2016-09-15 ENCOUNTER — Ambulatory Visit (HOSPITAL_COMMUNITY)
Admission: RE | Admit: 2016-09-15 | Discharge: 2016-09-15 | Disposition: A | Discharge status: Home / Self Care | Payer: Medicare Other | Source: Ambulatory Visit | Attending: Pulmonary Disease | Admitting: Pulmonary Disease

## 2016-09-15 DIAGNOSIS — M25551 Pain in right hip: Secondary | ICD-10-CM | POA: Diagnosis present

## 2016-11-01 ENCOUNTER — Encounter: Payer: Self-pay | Admitting: *Deleted

## 2016-11-01 ENCOUNTER — Ambulatory Visit (INDEPENDENT_AMBULATORY_CARE_PROVIDER_SITE_OTHER): Payer: Medicare Other | Admitting: Cardiology

## 2016-11-01 ENCOUNTER — Encounter: Payer: Self-pay | Admitting: Cardiology

## 2016-11-01 VITALS — BP 130/92 | HR 71 | Ht 62.0 in | Wt 184.4 lb

## 2016-11-01 DIAGNOSIS — R079 Chest pain, unspecified: Secondary | ICD-10-CM | POA: Diagnosis not present

## 2016-11-01 NOTE — Progress Notes (Signed)
Clinical Summary Amanda Webster is a 56 y.o.female seen today for follow up of the following medical problems.   1. Chest pain - seen in ER 07/18/16 with CP - thought to be chestwall pain from bronchitis, coughing. Given z-pak. Prilosec given for GERD symptoms.  - pain started several months ago. Started a pressure, midchest. 5-6/10 in severity. Can have some diaphoresis. Can have some SOB. Occurs several times a week. Not positional. No relation to food. Symptoms can last up to 1 hour. Can occur at anytime. - can have frequent belching - DOE at 1 block, which is stable.  - CAD risk factors: DM2, previous history of HTN now off meds, ?HL, +tobacco in the past about 18 years total, father MIs late 66s to early 37s - cannot run on treadmill due to chronic back pain.   - normal stress test 08/2016 - still with chest pain at times. Tends to occur in the evenings. Most recently while laying down. +nausea. Increased burning feeling throat.  Has appt pending with GI.    Past Medical History:  Diagnosis Date  . Arnold-Chiari malformation ([place])   . Arthritis   . Borderline diabetic   . Chronic headaches   . Colon polyps    nonadenomatous  . Depression   . Diabetes mellitus without complication ([place])   . Diverticulosis   . Esophageal stricture   . GERD (gastroesophageal reflux disease)   . HTN (hypertension)   . Hyperlipidemia   . IBS (irritable bowel syndrome)   . Sleep apnea      Allergies  Allergen Reactions  . Cymbalta [Duloxetine Hcl] Itching    Nasal itching  . Propoxyphene N-Acetaminophen Itching     Current Outpatient Prescriptions  Medication Sig Dispense Refill  . metFORMIN (GLUCOPHAGE) 500 MG tablet Take 500 mg by mouth 2 (two) times daily with a meal.    . naproxen sodium (ALEVE) 220 MG tablet Take 440 mg by mouth daily as needed (for pain).    Marland Kitchen omeprazole (PRILOSEC) 20 MG capsule Take 20 mg by mouth 2 (two) times daily.     No current  facility-administered medications for this visit.      Past Surgical History:  Procedure Laterality Date  . cahri decompression  6 and 03/1999  . CHOLECYSTECTOMY    . invasive cervical traction  02/2009  . left breast-lumpectomy    . shunt put in on right side of brain    . SHUNT REPLACEMENT  12/10  . TOTAL ABDOMINAL HYSTERECTOMY    . TUBAL LIGATION       Allergies  Allergen Reactions  . Cymbalta [Duloxetine Hcl] Itching    Nasal itching  . Propoxyphene N-Acetaminophen Itching      Family History  Problem Relation Age of Onset  . Colon polyps Mother   . Colon cancer Neg Hx      Social History Amanda Webster reports that she quit smoking about 26 years ago. She has never used smokeless tobacco. Amanda Webster reports that she does not drink alcohol.   Review of Systems CONSTITUTIONAL: No weight loss, fever, chills, weakness or fatigue.  HEENT: Eyes: No visual loss, blurred vision, double vision or yellow sclerae.No hearing loss, sneezing, congestion, runny nose or sore throat.  SKIN: No rash or itching.  CARDIOVASCULAR: per HPI RESPIRATORY: No shortness of breath, cough or sputum.  GASTROINTESTINAL: No anorexia, nausea, vomiting or diarrhea. No abdominal pain or blood.  GENITOURINARY: No burning on urination, no polyuria NEUROLOGICAL: No  headache, dizziness, syncope, paralysis, ataxia, numbness or tingling in the extremities. No change in bowel or bladder control.  MUSCULOSKELETAL: No muscle, back pain, joint pain or stiffness.  LYMPHATICS: No enlarged nodes. No history of splenectomy.  PSYCHIATRIC: No history of depression or anxiety.  ENDOCRINOLOGIC: No reports of sweating, cold or heat intolerance. No polyuria or polydipsia.  Marland Kitchen   Physical Examination Vitals:   11/01/16 1315  BP: (!) 130/92  Pulse: 71   Vitals:   11/01/16 1315  Weight: 184 lb 6.4 oz (83.6 kg)  Height: 5\' 2"  (1.575 m)    Gen: resting comfortably, no acute distress HEENT: no scleral icterus,  pupils equal round and reactive, no palptable cervical adenopathy,  CV: RRR, no m/r/g, no jvd Resp: Clear to auscultation bilaterally GI: abdomen is soft, non-tender, non-distended, normal bowel sounds, no hepatosplenomegaly MSK: extremities are warm, no edema.  Skin: warm, no rash Neuro:  no focal deficits Psych: appropriate affect   Diagnostic Studies 08/2016 Nuclear stress test  No diagnostic ST segment changes to indicate ischemia.  No significant myocardial perfusion defects to indicate scar or ischemia.  This is a low risk study.  Nuclear stress EF: 80%.    Assessment and Plan  1. Chest pain - atypical symptoms, negative stress test 08/2016 - symptoms sound most consistent with GI etiology. Her pcp has referred her to see GI.  - I have recommended she start aspirin 81mg  daily given her DM2 history for primary prevention - recommend trial of OTC zantac 150mg  bid - given her DM2 history, recommend to consider starting statin.      F/u 1year   Arnoldo Lenis, M.D.

## 2016-11-01 NOTE — Patient Instructions (Signed)
Your physician wants you to follow-up in: [place] DR. BRANCH You will receive a reminder letter in the mail two months in advance. If you don't receive a letter, please call our office to schedule the follow-up appointment.  Your physician has recommended you make the following change in your medication:   START ASPIRIN 81 MG DAILY  START OVER THE COUNTER ZANTAC 150 MG TWICE DAILY  Thank you for choosing [facility]!!

## 2016-12-01 ENCOUNTER — Encounter: Payer: Medicare Other | Attending: Pulmonary Disease | Admitting: Nutrition

## 2016-12-01 VITALS — Ht 62.0 in | Wt 183.0 lb

## 2016-12-01 DIAGNOSIS — E1165 Type 2 diabetes mellitus with hyperglycemia: Secondary | ICD-10-CM

## 2016-12-01 DIAGNOSIS — Z713 Dietary counseling and surveillance: Secondary | ICD-10-CM | POA: Insufficient documentation

## 2016-12-01 DIAGNOSIS — E669 Obesity, unspecified: Secondary | ICD-10-CM | POA: Diagnosis not present

## 2016-12-01 DIAGNOSIS — E119 Type 2 diabetes mellitus without complications: Secondary | ICD-10-CM | POA: Diagnosis not present

## 2016-12-01 DIAGNOSIS — E118 Type 2 diabetes mellitus with unspecified complications: Secondary | ICD-10-CM

## 2016-12-01 NOTE — Progress Notes (Signed)
  Medical Nutrition Therapy:  Appt start time: 1330 end time:  1400 Assessment:  Primary concerns today Diabetes Type 2.   Haven't gotten A1C done yet due to steroids shots in her neck.  FBS: 110-130's Before dinner 93-140's. 7 days 117 mg/dl 14 days 121 mg/dl   30 days 122 mg/dl.  Has been having some shaking episodes for last 2-3 days between  Breakfast and 11 am. Hasn't checked blood sugars at that time when that happens. Watching what she is eating, cut out sweets and junk foods, cut out sodas. Walks when she cans. Lost 1 lb. Metformin 500 mg BID     She feels much better overall. Has more energy and not as tired nor as much pain.  Previously A1C was 10.8% 03/2016.  Preferred Learning Style:   No preference indicated   Learning Readiness:   Ready  Change in progress  MEDICATIONS:see list  DIETARY INTAKE    24-hr recall:  B ( AM): Egg muffins and  Grapes, water or milk Snk ( AM):   L ( PM): Mixed vegetables with chicken and cheese on flat bread, water Snk ( PM):  D ( PM):  Spaghetti and fruit,  Water Snk ( PM):  Beverages: water, soda, juice  Usual physical activity: ADL  Estimated energy needs: 1500 calories 170 g carbohydrates 112 g protein 42 g fat  Progress Towards Goal(s):  In progress.   Nutritional Diagnosis:  NB-1.1 Food and nutrition-related knowledge deficit As related to Diabetes .  As evidenced by A1C > 7%.    Intervention:  Nutrition and Diabetes education provided on My Plate, CHO counting, meal planning, portion sizes, timing of meals, avoiding snacks between meals unless having a low blood sugar, target ranges for A1C and blood sugars, signs/symptoms and treatment of hyper/hypoglycemia, monitoring blood sugars, taking medications as prescribed, benefits of exercising 30 minutes per day and prevention of complications of DM.  Goals Keep up the great job!!  1. Increase vegetables with lunch and dinner meals 2. Add dried beans or alternative protein  choices 3. Eat high fiber foods Get A1C done  Increase walking as tolerated. Test blood sugar when you get the 'shakes' Treat low blood sugar of 70 or less with 15 grams of carbs;  1/2 c juice   Or 3-5 pieces of hard candy   Or 1/2c   Tea  Drink 4-5 bottles of water per day  Teaching Method Utilized:  Visual Auditory Hands on  Handouts given during visit include:  The Plate Method  Meal Plan Card  Diabetes Instructions.  Barriers to learning/adherence to lifestyle change:  None  Demonstrated degree of understanding via:  Teach Back   Monitoring/Evaluation:  Dietary intake, exercise, meal planning, SBG, and body weight in 3  month(s).

## 2016-12-01 NOTE — Patient Instructions (Addendum)
Keep up the great job!!  1. Increase vegetables with lunch and dinner meals 2. Add dried beans or alternative protein choices 3. Eat high fiber foods Get A1C done  Increase walking as tolerated. Test blood sugar when you get the 'shakes' Treat low blood sugar of 70 or less with 15 grams of carbs;  1/2 c juice   Or 3-5 pieces of hard candy   Or 1/2c   Tea  Drink 4-5 bottles of water per day

## 2016-12-15 ENCOUNTER — Encounter: Payer: Self-pay | Admitting: Internal Medicine

## 2016-12-15 ENCOUNTER — Ambulatory Visit (INDEPENDENT_AMBULATORY_CARE_PROVIDER_SITE_OTHER): Payer: Medicare Other | Admitting: Internal Medicine

## 2016-12-15 VITALS — BP 130/78 | HR 78 | Resp 16 | Ht 62.0 in | Wt 184.0 lb

## 2016-12-15 DIAGNOSIS — K219 Gastro-esophageal reflux disease without esophagitis: Secondary | ICD-10-CM | POA: Diagnosis not present

## 2016-12-15 DIAGNOSIS — R142 Eructation: Secondary | ICD-10-CM | POA: Diagnosis not present

## 2016-12-15 MED ORDER — OMEPRAZOLE 20 MG PO CPDR: 20.0000 mg | DELAYED_RELEASE_CAPSULE | Freq: Every day | ORAL | 6 refills | Status: DC

## 2016-12-15 NOTE — Patient Instructions (Signed)
We have sent the following medications to your pharmacy for you to pick up at your convenience:  Omeprazole  

## 2016-12-15 NOTE — Progress Notes (Signed)
HISTORY OF PRESENT ILLNESS:  Amanda Webster is a 56 y.o. female with multiple significant medical problems as outlined below who has been evaluated previously in this office for problems with diarrhea and musculoskeletal abdominal pain. She is also known to have irritable bowel syndrome, GERD, and prior cholecystectomy with negative ERCP. She has not been seen in this office since August 2014 when she underwent routine screening colonoscopy. He was found to have a diminutive sessile serrated polyp of the cecum and moderate left-sided diverticulosis. Follow-up in 5 years recommended. She is sent today by her primary care provider Dr. Luan Webster with a chief complaint of dyspepsia. Seen recently by her cardiologist for noncardiac chest pain. She is a very rambling historian who is not focused. As best I can tell, and with great effort, chief complaint seems to be burping or belching. She seems to engage in aerophagia as observed during the course of her interview. As best I can tell, she has no acid reflux symptoms except for occasional pyrosis. Currently taking ranitidine 150 mg twice daily. No regurgitation. She denies dysphagia. She did undergo upper endoscopy in 2003. At that time symptomatic peptic stricture was dilated. GI review of systems is otherwise remarkable for chronic stable IBS type symptoms. She appears anxious  REVIEW OF SYSTEMS:  All non-GI ROS negative except for  Past Medical History:  Diagnosis Date  . Arnold-Chiari malformation ([place])   . Arthritis   . Borderline diabetic   . Chronic headaches   . Colon polyps    nonadenomatous  . Depression   . Diabetes mellitus without complication ([place])   . Diverticulosis   . Esophageal stricture   . GERD (gastroesophageal reflux disease)   . HTN (hypertension)   . Hyperlipidemia   . IBS (irritable bowel syndrome)   . Sleep apnea     Past Surgical History:  Procedure Laterality Date  . cahri decompression  6 and 03/1999  .  CHOLECYSTECTOMY    . invasive cervical traction  02/2009  . left breast-lumpectomy    . shunt put in on right side of brain    . SHUNT REPLACEMENT  12/10  . TOTAL ABDOMINAL HYSTERECTOMY    . Amanda Webster  reports that she quit smoking about 26 years ago. She has never used smokeless tobacco. She reports that she does not drink alcohol or use drugs.  family history includes Colon polyps in her mother.  Allergies  Allergen Reactions  . Cymbalta [Duloxetine Hcl] Itching    Nasal itching  . Propoxyphene N-Acetaminophen Itching       PHYSICAL EXAMINATION: Vital signs: BP 130/78   Pulse 78   Resp 16   Ht 5\' 2"  (1.575 m)   Wt 184 lb (83.5 kg)   BMI 33.65 kg/m   Constitutional: generally well-appearing, no acute distress Psychiatric: alert and oriented x3, cooperative. Somewhat anxious Eyes: extraocular movements intact, anicteric, conjunctiva pink Mouth: oral pharynx moist, no lesions. No thrush Neck: supple without thyromegaly Lymph: no lymphadenopathy Cardiovascular: heart regular rate and rhythm, no murmur Lungs: clear to auscultation bilaterally Abdomen: soft, obese, nontender, nondistended, no obvious ascites, no peritoneal signs, normal bowel sounds, no organomegaly Rectal: Omitted Extremities: no clubbing cyanosis or lower extremity edema bilaterally Skin: no lesions on visible extremities except for tattoo on right wrist Neuro: No focal deficits. Cranial nerves intact. Normal DTRs. No asterixis.  ASSESSMENT:  #1. Belching associated with aerophagia #2. GERD with history of peptic stricture.  Minimal breakthrough symptoms on H2 receptor antagonist. No recurrent dysphagia #3. History of sessile serrated polyp August 2014 #4. Multiple medical problems   PLAN:  #1. Reassurance regarding belching #2. Reflux precautions #3. Prescribe omeprazole 20 mg daily for breakthrough reflux symptoms #4. Surveillance colonoscopy around  August 2019 #5. Return to the care of your PCP. Interval GI follow-up as needed  A copy of this consultation note has been sent to Dr. Luan Webster

## 2016-12-21 ENCOUNTER — Other Ambulatory Visit (HOSPITAL_COMMUNITY): Payer: Self-pay | Admitting: Pulmonary Disease

## 2016-12-21 DIAGNOSIS — I739 Peripheral vascular disease, unspecified: Secondary | ICD-10-CM

## 2017-03-09 ENCOUNTER — Other Ambulatory Visit (HOSPITAL_COMMUNITY): Payer: Self-pay | Admitting: Pulmonary Disease

## 2017-03-09 DIAGNOSIS — R230 Cyanosis: Secondary | ICD-10-CM

## 2017-03-14 ENCOUNTER — Ambulatory Visit (HOSPITAL_COMMUNITY)
Admission: RE | Admit: 2017-03-14 | Discharge: 2017-03-14 | Disposition: A | Discharge status: Home / Self Care | Payer: Medicare Other | Source: Ambulatory Visit | Attending: Pulmonary Disease | Admitting: Pulmonary Disease

## 2017-03-14 DIAGNOSIS — R230 Cyanosis: Secondary | ICD-10-CM | POA: Diagnosis present

## 2017-03-14 DIAGNOSIS — E785 Hyperlipidemia, unspecified: Secondary | ICD-10-CM | POA: Insufficient documentation

## 2017-03-14 DIAGNOSIS — I10 Essential (primary) hypertension: Secondary | ICD-10-CM | POA: Diagnosis not present

## 2017-03-14 DIAGNOSIS — E119 Type 2 diabetes mellitus without complications: Secondary | ICD-10-CM | POA: Insufficient documentation

## 2017-03-14 DIAGNOSIS — Z87891 Personal history of nicotine dependence: Secondary | ICD-10-CM | POA: Insufficient documentation

## 2017-04-08 ENCOUNTER — Telehealth: Payer: Self-pay | Admitting: Internal Medicine

## 2017-04-08 NOTE — Telephone Encounter (Signed)
Spoke with patient. She states she is having"bad indigestion again." Started out usually occurring at night but is now occurring during the day also. She reports she is on Oxtellar XL 300 mg daily for her seizures. (she started this a couple of months ago). She is taking her Omeprazole 20 mg every AM.  She will try increasing the Omeprazole to BID x 2 weeks then call back with an update on symptoms.

## 2018-01-04 LAB — LIPID PANEL
Cholesterol: 166 (ref 0–200)
HDL: 49 (ref 35–70)
LDL Cholesterol: 79
Triglycerides: 188 — AB (ref 40–160)

## 2018-01-04 LAB — HEPATIC FUNCTION PANEL
ALT: 25 (ref 7–35)
AST: 16 (ref 13–35)
Alkaline Phosphatase: 100 (ref 25–125)
Bilirubin, Total: 0.3

## 2018-02-27 ENCOUNTER — Other Ambulatory Visit (HOSPITAL_COMMUNITY): Payer: Self-pay | Admitting: Pulmonary Disease

## 2018-02-27 ENCOUNTER — Ambulatory Visit (HOSPITAL_COMMUNITY)
Admission: RE | Admit: 2018-02-27 | Discharge: 2018-02-27 | Disposition: A | Discharge status: Home / Self Care | Payer: Medicare Other | Source: Ambulatory Visit | Attending: Pulmonary Disease | Admitting: Pulmonary Disease

## 2018-02-27 DIAGNOSIS — R0602 Shortness of breath: Secondary | ICD-10-CM

## 2018-05-11 ENCOUNTER — Other Ambulatory Visit: Payer: Self-pay | Admitting: Pulmonary Disease

## 2018-05-11 DIAGNOSIS — Z1231 Encounter for screening mammogram for malignant neoplasm of breast: Secondary | ICD-10-CM

## 2018-06-23 ENCOUNTER — Ambulatory Visit
Admission: RE | Admit: 2018-06-23 | Discharge: 2018-06-23 | Disposition: A | Discharge status: Home / Self Care | Payer: Medicare Other | Source: Ambulatory Visit | Attending: Pulmonary Disease | Admitting: Pulmonary Disease

## 2018-06-23 DIAGNOSIS — Z1231 Encounter for screening mammogram for malignant neoplasm of breast: Secondary | ICD-10-CM

## 2018-06-23 LAB — HM MAMMOGRAPHY

## 2018-07-03 ENCOUNTER — Other Ambulatory Visit (HOSPITAL_COMMUNITY): Payer: Self-pay

## 2018-07-03 ENCOUNTER — Encounter: Payer: Self-pay | Admitting: Internal Medicine

## 2018-07-03 DIAGNOSIS — R1319 Other dysphagia: Secondary | ICD-10-CM

## 2018-07-04 ENCOUNTER — Encounter: Payer: Self-pay | Admitting: Internal Medicine

## 2018-07-06 ENCOUNTER — Other Ambulatory Visit (HOSPITAL_COMMUNITY): Payer: Self-pay | Admitting: Specialist

## 2018-07-12 ENCOUNTER — Ambulatory Visit (HOSPITAL_COMMUNITY)
Admission: RE | Admit: 2018-07-12 | Discharge: 2018-07-12 | Disposition: A | Discharge status: Home / Self Care | Payer: Medicare Other | Source: Ambulatory Visit | Attending: Pulmonary Disease | Admitting: Pulmonary Disease

## 2018-07-12 ENCOUNTER — Encounter (HOSPITAL_COMMUNITY): Payer: Self-pay | Admitting: Speech Pathology

## 2018-07-12 ENCOUNTER — Other Ambulatory Visit: Payer: Self-pay

## 2018-07-12 ENCOUNTER — Ambulatory Visit (HOSPITAL_COMMUNITY): Payer: Medicare Other | Attending: Pulmonary Disease | Admitting: Speech Pathology

## 2018-07-12 DIAGNOSIS — E119 Type 2 diabetes mellitus without complications: Secondary | ICD-10-CM | POA: Diagnosis not present

## 2018-07-12 DIAGNOSIS — I10 Essential (primary) hypertension: Secondary | ICD-10-CM | POA: Insufficient documentation

## 2018-07-12 DIAGNOSIS — R1319 Other dysphagia: Secondary | ICD-10-CM | POA: Insufficient documentation

## 2018-07-12 DIAGNOSIS — K219 Gastro-esophageal reflux disease without esophagitis: Secondary | ICD-10-CM | POA: Insufficient documentation

## 2018-07-12 DIAGNOSIS — K580 Irritable bowel syndrome with diarrhea: Secondary | ICD-10-CM | POA: Diagnosis not present

## 2018-07-12 DIAGNOSIS — F329 Major depressive disorder, single episode, unspecified: Secondary | ICD-10-CM | POA: Insufficient documentation

## 2018-07-12 DIAGNOSIS — E785 Hyperlipidemia, unspecified: Secondary | ICD-10-CM | POA: Diagnosis not present

## 2018-07-12 DIAGNOSIS — R1310 Dysphagia, unspecified: Secondary | ICD-10-CM | POA: Diagnosis present

## 2018-07-12 NOTE — Therapy (Signed)
[facility] [place], [place], [address] Phone: [phone]   Fax:  [phone]  Modified Barium Swallow  Patient Details  Name: Amanda Webster MRN: [id number] Date of Birth: Jan 21, 1961 No data recorded  Encounter Date: 07/12/2018  End of Session - 07/12/18 1442    Visit Number  1    Number of Visits  1    Authorization Type  [employer] Medicare    SLP Start Time  8588    SLP Stop Time   1400    SLP Time Calculation (min)  24 min    Activity Tolerance  Patient tolerated treatment well       Past Medical History:  Diagnosis Date  . Arnold-Chiari malformation ([place])   . Arthritis   . Borderline diabetic   . Chronic headaches   . Colon polyps    nonadenomatous  . Depression   . Diabetes mellitus without complication ([place])   . Diverticulosis   . Esophageal stricture   . GERD (gastroesophageal reflux disease)   . HTN (hypertension)   . Hyperlipidemia   . IBS (irritable bowel syndrome)   . Sleep apnea     Past Surgical History:  Procedure Laterality Date  . BREAST BIOPSY Left   . cahri decompression  6 and 03/1999  . CHOLECYSTECTOMY    . invasive cervical traction  02/2009  . left breast-lumpectomy    . shunt put in on right side of brain    . SHUNT REPLACEMENT  12/10  . TOTAL ABDOMINAL HYSTERECTOMY    . TUBAL LIGATION      There were no vitals filed for this visit.  Subjective Assessment - 07/12/18 1437    Subjective  "I aspirate my food."    Special Tests  MBSS    Currently in Pain?  No/denies          General - 07/12/18 1438      General Information   Date of Onset  06/13/18    HPI  Rosangelica Pevehouse is a 57 yo female who was referred for MBSS by Dr. Sinda Du due to Pt report of "choking and aspirating" when eating. She tells SLP that her memory is poor and she is not sure how often this happens or how long it has been happening. She states that she mentioned it to her PCP, but that it has not  happened since she mentioned it.     Type of Study  MBS-Modified Barium Swallow Study    Previous Swallow Assessment  None on record    Diet Prior to this Study  Regular;Thin liquids    Temperature Spikes Noted  No    Respiratory Status  Room air    History of Recent Intubation  No    Behavior/Cognition  Alert;Pleasant mood    Oral Cavity Assessment  Within Functional Limits    Oral Care Completed by SLP  No    Oral Cavity - Dentition  Adequate natural dentition    Vision  Functional for self feeding    Self-Feeding Abilities  Able to feed self    Patient Positioning  Upright in chair    Baseline Vocal Quality  Normal    Volitional Cough  Strong    Volitional Swallow  Able to elicit    Anatomy  Within functional limits   evidence of C-spine hardware   Pharyngeal Secretions  Not observed secondary MBS         Oral Preparation/Oral Phase -  07/12/18 1439      Oral Preparation/Oral Phase   Oral Phase  Within functional limits      Electrical stimulation - Oral Phase   Was Electrical Stimulation Used  No       Pharyngeal Phase - 07/12/18 1440      Pharyngeal Phase   Pharyngeal Phase  Impaired      Pharyngeal - Thin   Pharyngeal- Thin Cup  Swallow initiation at pyriform sinus;Within functional limits    Pharyngeal- Thin Straw  Within functional limits;Swallow initiation at pyriform sinus      Pharyngeal - Solids   Pharyngeal- Puree  Swallow initiation at vallecula;Within functional limits    Pharyngeal- Regular  Within functional limits    Pharyngeal- Pill  Within functional limits      Electrical Stimulation - Pharyngeal Phase   Was Electrical Stimulation Used  No       Cricopharyngeal Phase - 07/12/18 1440      Cervical Esophageal Phase   Cervical Esophageal Phase  Impaired      Cervical Esophageal Phase - Comment   Cervical Esophageal Comment  Bried stasis of barium tablet in cervical esophagus near C-spine hardware    Other Esophageal Phase Observations   Delayed emptying noted in distal esophagus- dysmotility per radiologist        Plan - 07/12/18 1442    Clinical Impression Statement  Pt presents with normal oropharyngeal swallow without penetration/aspiration and no significant residuals post swallow. Pt's symptoms could not be replicated during the study. Swallow trigger was generally at the level of the valleculae and occasionally to the pyriforms. Pt swallowed 2-3 times to completely clear purees (piecemeal) from oral cavity. Esophageal sweep completed and revealed stasis of barium tablet in cervical esophagus near hardware from fusion, but did pass through to stomach. Sweep also revealed delayed emptying (dysmotility) of distal esophagus with purees/solid. SLP encouraged Pt to keep a food journal to record when she has "choking" episodes and also to record what she is eating when it happens. Pt unable to provide insight into this today and in fact states that she has not had any "choking" episodes since she mentioned the problem to her PCP. SLP also encouraged Pt to cut up her foods well and take small bites to see if this helps. Pt appreciate of the information. No further SLP services indicated at this time.     Consulted and Agree with Plan of Care  Patient       Patient will benefit from skilled therapeutic intervention in order to improve the following deficits and impairments:   Other dysphagia    Recommendations/Treatment - 07/12/18 1441      Swallow Evaluation Recommendations   SLP Diet Recommendations  Age appropriate regular;Thin    Liquid Administration via  Cup;Straw    Medication Administration  Whole meds with liquid    Supervision  Patient able to self feed    Postural Changes  Seated upright at 90 degrees;Remain upright for at least 30 minutes after feeds/meals       Prognosis - 07/12/18 1441      Prognosis   Prognosis for Safe Diet Advancement  Good      Individuals Consulted   Consulted and Agree with Results  and Recommendations  Patient    Report Sent to   Referring physician       Problem List Patient Active Problem List   Diagnosis Date Noted  . Chest pain at rest 03/08/2011  . Obesity 03/08/2011  .  FROZEN LEFT SHOULDER 04/01/2010  . SLEEP APNEA 04/01/2010  . DIARRHEA, PERSISTENT 05/05/2009  . ABDOMINAL PAIN, RUQ 05/05/2009   Thank you,  Genene Churn, [place]  [facility] 07/12/2018, 2:43 PM  [facility] [address] [place], [place], [address] Phone: [phone]   Fax:  [phone]  Name: ASHAWNA HANBACK MRN: [id number] Date of Birth: 1961/05/25

## 2018-07-26 ENCOUNTER — Telehealth: Payer: Self-pay | Admitting: *Deleted

## 2018-07-26 ENCOUNTER — Ambulatory Visit (AMBULATORY_SURGERY_CENTER): Payer: Self-pay | Admitting: *Deleted

## 2018-07-26 ENCOUNTER — Encounter: Payer: Self-pay | Admitting: Internal Medicine

## 2018-07-26 VITALS — Ht 62.0 in | Wt 194.0 lb

## 2018-07-26 DIAGNOSIS — Z86010 Personal history of colonic polyps: Secondary | ICD-10-CM

## 2018-07-26 MED ORDER — NA SULFATE-K SULFATE-MG SULF 17.5-3.13-1.6 GM/177ML PO SOLN: 1.0000 | Freq: Once | ORAL | 0 refills | Status: AC

## 2018-07-26 NOTE — Telephone Encounter (Signed)
Okay to proceed.  Thank you 

## 2018-07-26 NOTE — Telephone Encounter (Signed)
Dr Henrene Pastor,  I saw Amanda Webster in [place] today.  She has a history of Seizures- Pt can not tell me when her last seizure was- she states she just spaces out, she is on Oxtellar BID for seizures- she states it has been within the last year but not sure when - she has not shaking or jerking with her seizures, she just stares and spaces out.  She also has a hx of hydrocephalus with [name]- Chiari malformation and a shunt in place, Hx GERD , IBS TA polyps . Last colon was 05-10-2013 with MAC and no issues per pt.  Is she okay to proceed with her scheduled colon 08-09-18 as scheduled?  Please advise, thanks so much for your time, Lelan Pons

## 2018-07-26 NOTE — Telephone Encounter (Signed)
Thank you sir- will proceed as scheduled -- Exie Parody e

## 2018-07-26 NOTE — Progress Notes (Signed)
No egg or soy allergy known to patient  No issues with past sedation with any surgeries  or procedures, no intubation problems  No diet pills per patient No home 02 use per patient  No blood thinners per patient  Pt denies issues with constipation  No A fib or A flutter  EMMI video sent to pt's e mail  Pt can not tell me when her last seizure was- she states she just spaces out, she is on Oxtellar BID for seizures- she states it has been within the last year but not sure when - TE to [place] about this

## 2018-08-09 ENCOUNTER — Encounter: Payer: Self-pay | Admitting: Internal Medicine

## 2018-08-09 ENCOUNTER — Ambulatory Visit (AMBULATORY_SURGERY_CENTER): Payer: Medicare Other | Admitting: Internal Medicine

## 2018-08-09 VITALS — BP 124/47 | HR 57 | Temp 98.7°F | Resp 13 | Ht 62.0 in | Wt 189.0 lb

## 2018-08-09 DIAGNOSIS — D123 Benign neoplasm of transverse colon: Secondary | ICD-10-CM

## 2018-08-09 DIAGNOSIS — D124 Benign neoplasm of descending colon: Secondary | ICD-10-CM

## 2018-08-09 DIAGNOSIS — D122 Benign neoplasm of ascending colon: Secondary | ICD-10-CM | POA: Diagnosis not present

## 2018-08-09 DIAGNOSIS — Z86010 Personal history of colonic polyps: Secondary | ICD-10-CM | POA: Diagnosis present

## 2018-08-09 LAB — HM COLONOSCOPY

## 2018-08-09 MED ORDER — SODIUM CHLORIDE 0.9 % IV SOLN: 500.0000 mL | Freq: Once | INTRAVENOUS | Status: DC

## 2018-08-09 NOTE — Progress Notes (Signed)
Called to room to assist during endoscopic procedure.  Patient ID and intended procedure confirmed with present staff. Received instructions for my participation in the procedure from the performing physician.  

## 2018-08-09 NOTE — Progress Notes (Signed)
Report to PACU, RN, vss, BBS= Clear.  

## 2018-08-09 NOTE — Progress Notes (Signed)
Pt's states no medical or surgical changes since previsit or office visit. 

## 2018-08-09 NOTE — Op Note (Signed)
[facility] Patient Name: Amanda Webster Procedure Date: 08/09/2018 11:11 AM MRN: [id number] Endoscopist: Docia Chuck. Henrene Pastor , MD Age: 57 Referring MD:  Date of Birth: [birth year] Gender: Female Account #: [id number] Procedure:                Colonoscopy with cold snare polypectomy x 4 Indications:              High risk colon cancer surveillance: Personal                            history of sessile serrated colon polyp (less than                            10 mm in size) with no dysplasia. Previous                            examinations 2003 (-); August 2014 Medicines:                Monitored Anesthesia Care Procedure:                Pre-Anesthesia Assessment:                           - Prior to the procedure, a History and Physical                            was performed, and patient medications and                            allergies were reviewed. The patient's tolerance of                            previous anesthesia was also reviewed. The risks                            and benefits of the procedure and the sedation                            options and risks were discussed with the patient.                            All questions were answered, and informed consent                            was obtained. Prior Anticoagulants: The patient has                            taken no previous anticoagulant or antiplatelet                            agents. ASA Grade Assessment: II - A patient with                            mild systemic disease. After reviewing the risks  and benefits, the patient was deemed in                            satisfactory condition to undergo the procedure.                           After obtaining informed consent, the colonoscope                            was passed under direct vision. Throughout the                            procedure, the patient's blood pressure, pulse, and   oxygen saturations were monitored continuously. The                            Colonoscope was introduced through the anus and                            advanced to the the cecum, identified by                            appendiceal orifice and ileocecal valve. The                            ileocecal valve, appendiceal orifice, and rectum                            were photographed. The quality of the bowel                            preparation was excellent. The colonoscopy was                            performed without difficulty. The patient tolerated                            the procedure well. The bowel preparation used was                            SUPREP. Scope In: 11:23:54 AM Scope Out: 11:39:23 AM Scope Withdrawal Time: 0 hours 10 minutes 43 seconds  Total Procedure Duration: 0 hours 15 minutes 29 seconds  Findings:                 Four polyps were found in the descending colon,                            transverse colon and ascending colon. The polyps                            were 3 to 5 mm in size. These polyps were removed                            with a cold  snare. Resection and retrieval were                            complete.                           Multiple diverticula were found in the left colon.                           There was an incidental 15 to 20 mm lipoma in the                            right colon. The exam was otherwise without                            abnormality on direct and retroflexion views. Complications:            No immediate complications. Estimated blood loss:                            None. Estimated Blood Loss:     Estimated blood loss: none. Impression:               - Four 3 to 5 mm polyps in the descending colon, in                            the transverse colon and in the ascending colon,                            removed with a cold snare. Resected and retrieved.                           - Diverticulosis in the  left colon. Incidental                            right colon lipoma.                           - The examination was otherwise normal on direct                            and retroflexion views. Recommendation:           - Repeat colonoscopy in 3 years for surveillance.                           - Patient has a contact number available for                            emergencies. The signs and symptoms of potential                            delayed complications were discussed with the  patient. Return to normal activities tomorrow.                            Written discharge instructions were provided to the                            patient.                           - Resume previous diet.                           - Continue present medications.                           - Await pathology results. Docia Chuck. Henrene Pastor, MD 08/09/2018 11:46:33 AM This report has been signed electronically.

## 2018-08-09 NOTE — Progress Notes (Signed)
I asked Amanda Webster when she last had a seizure, and Amanda Webster said she thinks it has been more than 3 months.  Amanda Webster has memory issues - Amanda Webster had Chiari malformation and brain surgery 19 years ago. She reports, "my memory checks in and out".  maw

## 2018-08-09 NOTE — Patient Instructions (Signed)
YOU HAD AN ENDOSCOPIC PROCEDURE TODAY AT THE [facility]:   Refer to the procedure report that was given to you for any specific questions about what was found during the examination.  If the procedure report does not answer your questions, please call your gastroenterologist to clarify.  If you requested that your care partner not be given the details of your procedure findings, then the procedure report has been included in a sealed envelope for you to review at your convenience later.  YOU SHOULD EXPECT: Some feelings of bloating in the abdomen. Passage of more gas than usual.  Walking can help get rid of the air that was put into your GI tract during the procedure and reduce the bloating. If you had a lower endoscopy (such as a colonoscopy or flexible sigmoidoscopy) you may notice spotting of blood in your stool or on the toilet paper. If you underwent a bowel prep for your procedure, you may not have a normal bowel movement for a few days.  Please Note:  You might notice some irritation and congestion in your nose or some drainage.  This is from the oxygen used during your procedure.  There is no need for concern and it should clear up in a day or so.  SYMPTOMS TO REPORT IMMEDIATELY:   Following lower endoscopy (colonoscopy or flexible sigmoidoscopy):  Excessive amounts of blood in the stool  Significant tenderness or worsening of abdominal pains  Swelling of the abdomen that is new, acute  Fever of 100F or higher  For urgent or emergent issues, a gastroenterologist can be reached at any hour by calling [phone].   DIET:  We do recommend a small meal at first, but then you may proceed to your regular diet.  Drink plenty of fluids but you should avoid alcoholic beverages for 24 hours.  ACTIVITY:  You should plan to take it easy for the rest of today and you should NOT DRIVE or use heavy machinery until tomorrow (because of the sedation medicines used during the test).     FOLLOW UP: Our staff will call the number listed on your records the next business day following your procedure to check on you and address any questions or concerns that you may have regarding the information given to you following your procedure. If we do not reach you, we will leave a message.  However, if you are feeling well and you are not experiencing any problems, there is no need to return our call.  We will assume that you have returned to your regular daily activities without incident.  If any biopsies were taken you will be contacted by phone or by letter within the next 1-3 weeks.  Please call us at [phone] if you have not heard about the biopsies in 3 weeks.    SIGNATURES/CONFIDENTIALITY: You and/or your care partner have signed paperwork which will be entered into your electronic medical record.  These signatures attest to the fact that that the information above on your After Visit Summary has been reviewed and is understood.  Full responsibility of the confidentiality of this discharge information lies with you and/or your care-partner. 

## 2018-08-14 ENCOUNTER — Telehealth: Payer: Self-pay

## 2018-08-14 NOTE — Telephone Encounter (Signed)
  Follow up Call-  Call back number 08/09/2018  Post procedure Call Back phone  # [phone] cell  Permission to leave phone message Yes  Some recent data might be hidden     Patient questions:  Do you have a fever, pain , or abdominal swelling? No. Pain Score  0 *  Have you tolerated food without any problems? Yes.    Have you been able to return to your normal activities? Yes.    Do you have any questions about your discharge instructions: Diet   No. Medications  No. Follow up visit  No.  Do you have questions or concerns about your Care? No.  Actions: * If pain score is 4 or above: No action needed, pain <4.  Patient stated she is eating, but just doesn't feel like eating as much.  I asked her to give the office a call back later in the week if she is feeling the same way.

## 2018-08-16 ENCOUNTER — Encounter: Payer: Self-pay | Admitting: Internal Medicine

## 2018-10-05 ENCOUNTER — Telehealth: Payer: Self-pay | Admitting: Cardiology

## 2018-10-05 NOTE — Telephone Encounter (Signed)
Numerous attempts to contact patient with recall letters. Unable to reach by telephone. with no success.    11/01/2016 2:20 PM New [10]    [System] 07/14/2017 11:03 PM Notification Sent [20]   Amanda Webster [[id number]] 02/01/2018 12:34 PM Notification Sent [20]   Amanda Webster [[id number]] 07/26/2018 9:15 AM Notification Sent [20]   Amanda Webster [[id number]] 10/05/2018 2:07 PM Notification Sent [20]

## 2019-04-18 ENCOUNTER — Other Ambulatory Visit (HOSPITAL_COMMUNITY): Payer: Self-pay | Admitting: Pulmonary Disease

## 2019-04-18 ENCOUNTER — Other Ambulatory Visit: Payer: Self-pay | Admitting: Pulmonary Disease

## 2019-04-18 DIAGNOSIS — M7989 Other specified soft tissue disorders: Secondary | ICD-10-CM

## 2019-04-25 ENCOUNTER — Other Ambulatory Visit: Payer: Self-pay

## 2019-04-25 ENCOUNTER — Ambulatory Visit (HOSPITAL_COMMUNITY)
Admission: RE | Admit: 2019-04-25 | Discharge: 2019-04-25 | Disposition: A | Discharge status: Home / Self Care | Payer: Medicare Other | Source: Ambulatory Visit | Attending: Pulmonary Disease | Admitting: Pulmonary Disease

## 2019-04-25 DIAGNOSIS — M7989 Other specified soft tissue disorders: Secondary | ICD-10-CM

## 2019-04-25 LAB — BASIC METABOLIC PANEL
BUN: 10 (ref 4–21)
CO2: 25 — AB (ref 13–22)
Chloride: 99 (ref 99–108)
Creatinine: 0.9 (ref 0.5–1.1)
Glucose: 278
Potassium: 4.1 (ref 3.4–5.3)
Sodium: 139 (ref 137–147)

## 2019-04-25 LAB — COMPREHENSIVE METABOLIC PANEL: Calcium: 9.7 (ref 8.7–10.7)

## 2019-05-30 ENCOUNTER — Other Ambulatory Visit (HOSPITAL_COMMUNITY): Payer: Self-pay | Admitting: Pulmonary Disease

## 2019-05-30 ENCOUNTER — Other Ambulatory Visit: Payer: Self-pay

## 2019-05-30 ENCOUNTER — Ambulatory Visit (HOSPITAL_COMMUNITY)
Admission: RE | Admit: 2019-05-30 | Discharge: 2019-05-30 | Disposition: A | Discharge status: Home / Self Care | Payer: Medicare Other | Source: Ambulatory Visit | Attending: Pulmonary Disease | Admitting: Pulmonary Disease

## 2019-05-30 DIAGNOSIS — M25572 Pain in left ankle and joints of left foot: Secondary | ICD-10-CM | POA: Insufficient documentation

## 2019-05-30 DIAGNOSIS — M25551 Pain in right hip: Secondary | ICD-10-CM | POA: Diagnosis present

## 2019-06-20 ENCOUNTER — Other Ambulatory Visit: Payer: Self-pay

## 2019-06-20 ENCOUNTER — Ambulatory Visit
Admission: EM | Admit: 2019-06-20 | Discharge: 2019-06-20 | Disposition: A | Discharge status: Home / Self Care | Payer: Medicare Other

## 2019-06-20 DIAGNOSIS — M79671 Pain in right foot: Secondary | ICD-10-CM

## 2019-06-20 DIAGNOSIS — M79672 Pain in left foot: Secondary | ICD-10-CM

## 2019-06-20 DIAGNOSIS — M792 Neuralgia and neuritis, unspecified: Secondary | ICD-10-CM

## 2019-06-20 DIAGNOSIS — Z8639 Personal history of other endocrine, nutritional and metabolic disease: Secondary | ICD-10-CM

## 2019-06-20 MED ORDER — PREDNISONE 20 MG PO TABS: 20.0000 mg | ORAL_TABLET | Freq: Two times a day (BID) | ORAL | 0 refills | Status: AC

## 2019-06-20 NOTE — Discharge Instructions (Signed)
Symptoms sound consistent with nerve pain Avoid painful activities Continue with gabapentin as prescribed Since sugars have been well-controlled, we will trial a short course of prednisone Follow up with PCP next week for recheck and for further evaluation and management of chronic foot pain Return or go to the ER if you have any new or worsening symptoms (fever, chills, chest pain, redness, swelling, symptoms do not improve with medications, etc...)

## 2019-06-20 NOTE — ED Triage Notes (Signed)
Pt having nerve pain in feet when walking in both feet

## 2019-06-20 NOTE — ED Provider Notes (Signed)
[place]   [id number] 06/20/19 Arrival Time: [id number]  CC: Foot pain  SUBJECTIVE: History from: patient. Amanda Webster is a 58 y.o. female complains of acute on chronic bilateral foot pain that flared up 1 week ago.  States she has been "walking trails" more often.  Denies a specific injury, or precipitating event.  Localizes the pain to bottoms of bilateral feet, LT > RT.  Describes the pain as constant and sharp in character.  Has tried gabapentin without relief.  Symptoms are made worse with walking.  Denies fever, chills, CP, SOB, erythema, ecchymosis, effusion, weakness, numbness and tingling.    Hx significnat for DM.  States daily blood sugars have been 130-140 range.    ROS: As per HPI.  All other pertinent ROS negative.     Past Medical History:  Diagnosis Date  . Allergy   . Anxiety   . Arnold-Chiari malformation ([place])   . Arthritis   . Borderline diabetic   . Chronic headaches   . Colon polyps    nonadenomatous  . Depression   . Diabetes mellitus without complication ([place])   . Diverticulosis   . Esophageal stricture   . GERD (gastroesophageal reflux disease)   . HTN (hypertension)   . Hyperlipidemia   . IBS (irritable bowel syndrome)   . Seizures ([place])    pt not sure when her last seizure was- she says she just spaces out- no shaking but she could not tell me last seizure-  on Oxtellar bid for seizures   . Sleep apnea    no cpap now- did use 02 with cpap but has not used either in "years"   Past Surgical History:  Procedure Laterality Date  . BREAST BIOPSY Left   . cahri decompression  6 and 03/1999  . CHOLECYSTECTOMY    . COLONOSCOPY    . invasive cervical traction  02/2009  . left breast-lumpectomy    . POLYPECTOMY    . shunt put in on right side of brain    . SHUNT REPLACEMENT  12/10  . TOTAL ABDOMINAL HYSTERECTOMY    . TUBAL LIGATION    . UPPER GASTROINTESTINAL ENDOSCOPY     Allergies  Allergen Reactions  . Cymbalta [Duloxetine Hcl]  Itching    Nasal itching  . Propoxyphene N-Acetaminophen Itching   Current Facility-Administered Medications on File Prior to Encounter  Medication Dose Route Frequency Provider Last Rate Last Dose  . 0.9 %  sodium chloride infusion  500 mL Intravenous Once Irene Shipper, MD       Current Outpatient Medications on File Prior to Encounter  Medication Sig Dispense Refill  . ACCU-CHEK SOFTCLIX LANCETS lancets USE AS DIRECTED BID  5  . aspirin EC 81 MG tablet Take by mouth.    Marland Kitchen atorvastatin (LIPITOR) 20 MG tablet TK 1 T PO QD  12  . cetirizine (ZYRTEC) 10 MG tablet TK 1 T PO QD  5  . gabapentin (NEURONTIN) 300 MG capsule Take 300 mg by mouth daily.    . methocarbamol (ROBAXIN) 500 MG tablet Take 500 mg by mouth every 8 (eight) hours as needed.  2  . naproxen sodium (ALEVE) 220 MG tablet Take 440 mg by mouth daily as needed (for pain).    Marland Kitchen omeprazole (PRILOSEC) 20 MG capsule Take 1 capsule (20 mg total) by mouth daily. (Patient not taking: Reported on 07/26/2018) 30 capsule 6  . OXTELLAR XR 300 MG TB24 TK 2 TS PO QD FOR EPILEPSY  1  . OZEMPIC, 1 MG/DOSE, 2 MG/1.5ML SOPN Inject 1 mg into the skin once a week.    . propranolol (INDERAL) 20 MG tablet TAKE 1/2 TABLET BY MOUTH TWICE DAILY FOR 1 WEEK. THEN 1 TABLET BY MOUTH TWICE DAILY THEREAFTER  2  . ranitidine (ZANTAC) 150 MG tablet Take 150 mg by mouth 2 (two) times daily.    Marland Kitchen topiramate (TOPAMAX) 25 MG tablet TK 1 T PO D FOR 1 WEEK THEN 1 TABLET TWICE A DAY FOR 1 WEEK THEN 1 TABLET IN THE MORNING AND 2 IN THE EVENING  5  . [DISCONTINUED] metFORMIN (GLUCOPHAGE) 500 MG tablet Take 500 mg by mouth 2 (two) times daily with a meal.     Social History   Socioeconomic History  . Marital status: Married    Spouse name: Not on file  . Number of children: Not on file  . Years of education: Not on file  . Highest education level: Not on file  Occupational History  . Not on file  Social Needs  . Financial resource strain: Not on file  . Food  insecurity    Worry: Not on file    Inability: Not on file  . Transportation needs    Medical: Not on file    Non-medical: Not on file  Tobacco Use  . Smoking status: Former Smoker    Quit date: 09/13/1990    Years since quitting: 28.7  . Smokeless tobacco: Never Used  Substance and Sexual Activity  . Alcohol use: No  . Drug use: No  . Sexual activity: Yes    Birth control/protection: Surgical  Lifestyle  . Physical activity    Days per week: Not on file    Minutes per session: Not on file  . Stress: Not on file  Relationships  . Social Herbalist on phone: Not on file    Gets together: Not on file    Attends religious service: Not on file    Active member of club or organization: Not on file    Attends meetings of clubs or organizations: Not on file    Relationship status: Not on file  . Intimate partner violence    Fear of current or ex partner: Not on file    Emotionally abused: Not on file    Physically abused: Not on file    Forced sexual activity: Not on file  Other Topics Concern  . Not on file  Social History Narrative   Disabled. Drinks 4-5 cups of tea daily. Does not get regular exercise.    Family History  Problem Relation Age of Onset  . Colon polyps Mother   . Breast cancer Mother   . Colon cancer Neg Hx   . Esophageal cancer Neg Hx   . Rectal cancer Neg Hx   . Stomach cancer Neg Hx     OBJECTIVE:  Vitals:   06/20/19 1823  BP: (!) 146/87  Pulse: 79  Resp: 18  Temp: 98.3 F (36.8 C)  SpO2: 94%    General appearance: ALERT; in no acute distress.  Head: NCAT Lungs: Normal respiratory effort CV: Dorsalis pedis pulses 2+ bilaterally. Cap refill < 2 seconds Musculoskeletal: Bilateral feet Inspection: Skin warm, dry, clear and intact without obvious erythema, effusion, or ecchymosis. Small punctate wound localized to lateral aspect of fourth digit on left foot, no bleeding with scab formation Palpation: Mildly TTP in between 4th and 5th  digits of bilateral feet ROM: FROM active  and passive Strength: 5/5 dorsiflexion, 5/5 plantar flexion Skin: warm and dry Neurologic: Ambulates with minimal difficulty, ambulates with a cane; Sensation intact about the lower extremities Psychological: alert and cooperative; normal mood and affect   ASSESSMENT & PLAN:  1. Nerve pain   2. Bilateral foot pain   3. H/O diabetic neuropathy     Meds ordered this encounter  Medications  . predniSONE (DELTASONE) 20 MG tablet    Sig: Take 1 tablet (20 mg total) by mouth 2 (two) times daily with a meal for 5 days.    Dispense:  10 tablet    Refill:  0    Order Specific Question:   Supervising Provider    Answer:   Raylene Everts [id number]   Symptoms sound consistent with nerve pain Avoid painful activities Continue with gabapentin as prescribed Since sugars have been well-controlled, we will trial a short course of prednisone Follow up with PCP next week for recheck and for further evaluation and management of chronic foot pain Return or go to the ER if you have any new or worsening symptoms (fever, chills, chest pain, redness, swelling, symptoms do not improve with medications, etc...)    Reviewed expectations re: course of current medical issues. Questions answered. Outlined signs and symptoms indicating need for more acute intervention. Patient verbalized understanding. After Visit Summary given.    Lestine Box, PA-C 06/21/19 1052

## 2019-07-09 LAB — HEMOGLOBIN A1C: Hemoglobin A1C: 7.4

## 2019-08-21 ENCOUNTER — Ambulatory Visit (INDEPENDENT_AMBULATORY_CARE_PROVIDER_SITE_OTHER): Payer: Medicare Other | Admitting: Family Medicine

## 2019-08-21 ENCOUNTER — Other Ambulatory Visit: Payer: Self-pay

## 2019-08-21 ENCOUNTER — Encounter: Payer: Self-pay | Admitting: Family Medicine

## 2019-08-21 VITALS — BP 164/103 | HR 70 | Temp 97.7°F | Ht 62.0 in | Wt 188.6 lb

## 2019-08-21 DIAGNOSIS — Z794 Long term (current) use of insulin: Secondary | ICD-10-CM

## 2019-08-21 DIAGNOSIS — E0865 Diabetes mellitus due to underlying condition with hyperglycemia: Secondary | ICD-10-CM | POA: Diagnosis not present

## 2019-08-21 DIAGNOSIS — R519 Headache, unspecified: Secondary | ICD-10-CM

## 2019-08-21 DIAGNOSIS — E08618 Diabetes mellitus due to underlying condition with other diabetic arthropathy: Secondary | ICD-10-CM

## 2019-08-21 DIAGNOSIS — I10 Essential (primary) hypertension: Secondary | ICD-10-CM

## 2019-08-21 DIAGNOSIS — G8929 Other chronic pain: Secondary | ICD-10-CM

## 2019-08-21 DIAGNOSIS — IMO0002 Reserved for concepts with insufficient information to code with codable children: Secondary | ICD-10-CM

## 2019-08-21 LAB — POCT GLYCOSYLATED HEMOGLOBIN (HGB A1C): Hemoglobin A1C: 7.4 % — AB (ref 4.0–5.6)

## 2019-08-21 NOTE — Progress Notes (Addendum)
New Patient Office Visit  Subjective:  Patient ID: [name], female    DOB: [birth year]  Age: 58 y.o. MRN: [id number]  CC:  Chief Complaint  Patient presents with  . Establish Care  . Seizures  headaches-seen by neurology for injections-concern for elevated blood pressure   HPI Amanda Webster presents for pt states the headache last Friday was more intense than normal-pt seen by neurology and given a Demerol shot and Phenergan shot when seen in Neurology office on Friday. Pt states headaches improved and when she woke the intensity was better. Pain Scale 10/10 upon waking...improved to 2-3/10 after the shot and several hours.  Pt with headache today scale 02/17/09. Pt states normal headache intensity-pt states pressure on the right temporal area. Associated nausea. Pt states "I do not feel good"  .Marland KitchenMarland Kitchen"I feel drained"  Pt states her blood pressure was elevated at neurology on Friday. Pt with one seizure Thursday night-first one in over a year. Repeat seizure on Friday-last longer and more intense than Thursday night.  Pt seen by neurology on Friday for botox shot for headaches. Pt states headaches over the weekend improved. Pt taking oxtellar  Daily with no recent change. Pt taking robaxin prn for neck pain  Hyperlipidemia-lipitor daily    Neurology clinic-58 year old right handed female who comes to the clinic for evaluation of headache.   The patient has history of chronic daily headache and she has history of tinnitus in the last 5 years. The patient has history of arnold chiari malformation s/p decompression on 2000 and hydrocephalus s/p shunt 19 years ago. She was seen by chiari speciliast Amanda Davidson, MD at the [facility] and he removed old shunt and replaced it with [employer] Strata valve which she has kept and it has been set at 1.0 all of these years. The shunt was recently check by neurosurgery and the set was 1.0. She also had a brain MRI w/wo on 05/11/2018 and it  reported that there was no significant change in size or morphology of ventricular system when compared with 2016.   The headache she presents today is frontal and most of the time occipital bilaterally. There are no relieving factors. The pain is constant. The intensity is usually 7/10. The pain is described like a pressure. She is currently taking propranolol and methocarbamol for headache. She was started on propranolol because her BP was found to be high.  The patient tried injections in the past and she thinks it didn't help much. She doesn't want to continue taking the injections.  She has seen a headache specialist in the past and she was recommended effexor but it did not helped with the headache.  She tried topamax 20 year ago before having the decompression. No history of kidney stones. She thinks that her vision is blurry at times, she follows with an optometrist once a year. Her eye exam has been normal in the past. The patient reports that she is going though a lot of stress and she would like to see a psychiatrist.   PMH Amanda Webster chiari malformation s/p decompression Hydrocephalus s/p shunt 19 years ago  Carpal tunnel bilaterally  C5-C7 ACDF   DM-glucose 127 today and A1c 7.4-ozempic started  Several months ago with improved results-not taking glucophage  Past Medical History:  Diagnosis Date  . Allergy   . Anxiety   . Arnold-Chiari malformation ([place])   . Arthritis   . Borderline diabetic   . Chronic headaches   .  Colon polyps    nonadenomatous  . Depression   . Diabetes mellitus without complication ([place])   . Diverticulosis   . Esophageal stricture   . GERD (gastroesophageal reflux disease)   . HTN (hypertension)   . Hyperlipidemia   . IBS (irritable bowel syndrome)   . Seizures ([place])    pt not sure when her last seizure was- she says she just spaces out- no shaking but she could not tell me last seizure-  on Oxtellar bid for seizures   . Sleep apnea    no cpap now-  did use 02 with cpap but has not used either in "years"    Past Surgical History:  Procedure Laterality Date  . BREAST BIOPSY Left   . cahri decompression  6 and 03/1999  . CHOLECYSTECTOMY    . COLONOSCOPY    . invasive cervical traction  02/2009  . left breast-lumpectomy    . POLYPECTOMY    . shunt put in on right side of brain    . SHUNT REPLACEMENT  12/10  . TOTAL ABDOMINAL HYSTERECTOMY    . TUBAL LIGATION    . UPPER GASTROINTESTINAL ENDOSCOPY      Family History  Problem Relation Age of Onset  . Colon polyps Mother   . Breast cancer Mother   . Colon cancer Neg Hx   . Esophageal cancer Neg Hx   . Rectal cancer Neg Hx   . Stomach cancer Neg Hx     Social History   Socioeconomic History  . Marital status: Married    Spouse name: Not on file  . Number of children: Not on file  . Years of education: Not on file  . Highest education level: Not on file  Occupational History  . Not on file  Social Needs  . Financial resource strain: Not on file  . Food insecurity    Worry: Not on file    Inability: Not on file  . Transportation needs    Medical: Not on file    Non-medical: Not on file  Tobacco Use  . Smoking status: Former Smoker    Quit date: 09/13/1990    Years since quitting: 28.9  . Smokeless tobacco: Never Used  Substance and Sexual Activity  . Alcohol use: No  . Drug use: No  . Sexual activity: Yes    Birth control/protection: Surgical  Lifestyle  . Physical activity    Days per week: Not on file    Minutes per session: Not on file  . Stress: Not on file  Relationships  . Social Herbalist on phone: Not on file    Gets together: Not on file    Attends religious service: Not on file    Active member of club or organization: Not on file    Attends meetings of clubs or organizations: Not on file    Relationship status: Not on file  . Intimate partner violence    Fear of current or ex partner: Not on file    Emotionally abused: Not on file     Physically abused: Not on file    Forced sexual activity: Not on file  Other Topics Concern  . Not on file  Social History Narrative   Disabled. Drinks 4-5 cups of tea daily. Does not get regular exercise.     ROS Review of Systems  Constitutional: Negative.   HENT: Negative.   Respiratory: Negative.   Cardiovascular: Negative.   Gastrointestinal:  GERD  Endocrine:       DM  Skin: Negative.   Allergic/Immunologic: Positive for environmental allergies (zyrtec).  Neurological: Positive for seizures and headaches.       Oxtellar XR -followed by neurology-seen on Friday for botox treatments  Headaches in the past-pt has a shunt. Pt states she was seen in the past at [facility] for ongoing management of her shunt.  Psychiatric/Behavioral: Positive for agitation. The patient is nervous/anxious.     Objective:   Today's Vitals: BP (!) 164/103 (BP Location: Left Arm, Patient Position: Sitting, Cuff Size: Normal)   Pulse 70   Temp 97.7 F (36.5 C) (Oral)   Ht 5\' 2"  (1.575 m)   Wt 188 lb 9.6 oz (85.5 kg)   SpO2 96%   BMI 34.50 kg/m   Physical Exam Constitutional:      Appearance: Normal appearance.  Cardiovascular:     Rate and Rhythm: Normal rate and regular rhythm.     Pulses: Normal pulses.  Pulmonary:     Effort: Pulmonary effort is normal.  Musculoskeletal:     Cervical back: Normal range of motion.  Neurological:     General: No focal deficit present.     Mental Status: She is oriented to person, place, and time.     Motor: No weakness.     Coordination: Coordination normal.     Gait: Gait normal.     Deep Tendon Reflexes: Reflexes normal.     Assessment & Plan:  1. Uncontrolled diabetes mellitus due to underlying condition with diabetic arthropathy, with long-term current use of insulin (HCC) - POCT HgB A1C - Collection capillary blood specimen - Lipid panel - TSH - CBC ozempic 2. Essential hypertension Propanolol/neurontin-elevated today-concern  for shunt malfunction - Lipid panel - TSH - CBC  3. Chronic intractable headache, unspecified headache type Followed by neurology-seen at [facility] in the past for evaluation-recommended ER at [facility] due to seizures, headache and elevated blood pressure  Outpatient Encounter Medications as of 08/21/2019  Medication Sig  . ACCU-CHEK SOFTCLIX LANCETS lancets USE AS DIRECTED BID  . aspirin EC 81 MG tablet Take by mouth.  Marland Kitchen atorvastatin (LIPITOR) 20 MG tablet TK 1 T PO QD  . cetirizine (ZYRTEC) 10 MG tablet TK 1 T PO QD  . gabapentin (NEURONTIN) 300 MG capsule Take 300 mg by mouth daily.  . methocarbamol (ROBAXIN) 500 MG tablet Take 500 mg by mouth every 8 (eight) hours as needed.  . naproxen sodium (ALEVE) 220 MG tablet Take 440 mg by mouth daily as needed (for pain).  . OXTELLAR XR 300 MG TB24 TK 2 TS PO QD FOR EPILEPSY  . OZEMPIC, 1 MG/DOSE, 2 MG/1.5ML SOPN Inject 1 mg into the skin once a week.  . propranolol (INDERAL) 20 MG tablet TAKE 1/2 TABLET BY MOUTH TWICE DAILY FOR 1 WEEK. THEN 1 TABLET BY MOUTH TWICE DAILY THEREAFTER  . [DISCONTINUED] metFORMIN (GLUCOPHAGE) 500 MG tablet Take 500 mg by mouth 2 (two) times daily with a meal.  . [DISCONTINUED] omeprazole (PRILOSEC) 20 MG capsule Take 1 capsule (20 mg total) by mouth daily. (Patient not taking: Reported on 07/26/2018)  . [DISCONTINUED] ranitidine (ZANTAC) 150 MG tablet Take 150 mg by mouth 2 (two) times daily.  . [DISCONTINUED] topiramate (TOPAMAX) 25 MG tablet TK 1 T PO D FOR 1 WEEK THEN 1 TABLET TWICE A DAY FOR 1 WEEK THEN 1 TABLET IN THE MORNING AND 2 IN THE EVENING   Facility-Administered Encounter Medications as of 08/21/2019  Medication  . 0.9 %  sodium chloride infusion    Follow-up: [facility] to be evaluated for headaches/elevated blood pressure/seizures D/w pt neurology-ongoing follow up with neurology in [place] for ongoing care.  Acute evaluation at [facility] due to seizures/headaches/elevated blood pressure   Hannah Beat, MD

## 2019-08-21 NOTE — Patient Instructions (Addendum)
Go to [facility] ER for evaluation-elevated blood pressure and seizure x 2 concerning for possible shunt malformation. Glucose readings readings normal-127 in office.

## 2019-08-27 DIAGNOSIS — I10 Essential (primary) hypertension: Secondary | ICD-10-CM | POA: Insufficient documentation

## 2019-08-27 DIAGNOSIS — E1142 Type 2 diabetes mellitus with diabetic polyneuropathy: Secondary | ICD-10-CM | POA: Insufficient documentation

## 2019-08-27 DIAGNOSIS — G8929 Other chronic pain: Secondary | ICD-10-CM | POA: Insufficient documentation

## 2019-08-27 DIAGNOSIS — G43909 Migraine, unspecified, not intractable, without status migrainosus: Secondary | ICD-10-CM | POA: Insufficient documentation

## 2019-08-27 DIAGNOSIS — E119 Type 2 diabetes mellitus without complications: Secondary | ICD-10-CM | POA: Insufficient documentation

## 2019-08-27 DIAGNOSIS — E08618 Diabetes mellitus due to underlying condition with other diabetic arthropathy: Secondary | ICD-10-CM | POA: Insufficient documentation

## 2019-08-29 ENCOUNTER — Encounter: Payer: Self-pay | Admitting: Family Medicine

## 2019-08-29 ENCOUNTER — Other Ambulatory Visit: Payer: Self-pay

## 2019-08-29 ENCOUNTER — Ambulatory Visit: Payer: Medicare Other | Admitting: Family Medicine

## 2019-08-29 VITALS — BP 150/98 | HR 89 | Temp 98.5°F | Ht 62.0 in | Wt 193.0 lb

## 2019-08-29 DIAGNOSIS — I10 Essential (primary) hypertension: Secondary | ICD-10-CM | POA: Diagnosis not present

## 2019-08-29 MED ORDER — PROPRANOLOL HCL 40 MG PO TABS: 40.0000 mg | ORAL_TABLET | Freq: Two times a day (BID) | ORAL | 1 refills | Status: DC

## 2019-08-29 NOTE — Progress Notes (Signed)
Established Patient Office Visit  Subjective:  Patient ID: Amanda Webster, female    DOB: [birth year]  Age: 58 y.o. MRN: [id number]  CC:  Chief Complaint  Patient presents with  . Hypertension    follow up    HPI [employer] presents for HTN-pt unable to take bp at home DM-glucose 107 fasting this morning Pt with diabetic shoes fitted today Pt with continued fatigue-worried about headaches-seen in ER at [facility] for evaluation-no change in the shunt-CT completed at [facility]-neurosurgery  Migraines-sees neuro-copazine recommended at ER  Past Medical History:  Diagnosis Date  . Allergy   . Anxiety   . Arnold-Chiari malformation ([place])   . Arthritis   . Borderline diabetic   . Chronic headaches   . Colon polyps    nonadenomatous  . Depression   . Diabetes mellitus without complication ([place])   . Diverticulosis   . Esophageal stricture   . GERD (gastroesophageal reflux disease)   . HTN (hypertension)   . Hyperlipidemia   . IBS (irritable bowel syndrome)   . Seizures ([place])    pt not sure when her last seizure was- she says she just spaces out- no shaking but she could not tell me last seizure-  on [place] bid for seizures   . Sleep apnea    no cpap now- did use 02 with cpap but has not used either in "years"    Past Surgical History:  Procedure Laterality Date  . BREAST BIOPSY Left   . cahri decompression  6 and 03/1999  . CHOLECYSTECTOMY    . COLONOSCOPY    . invasive cervical traction  02/2009  . left breast-lumpectomy    . POLYPECTOMY    . shunt put in on right side of brain    . SHUNT REPLACEMENT  12/10  . TOTAL ABDOMINAL HYSTERECTOMY    . TUBAL LIGATION    . UPPER GASTROINTESTINAL ENDOSCOPY      Family History  Problem Relation Age of Onset  . Colon polyps Mother   . Breast cancer Mother   . Colon cancer Neg Hx   . Esophageal cancer Neg Hx   . Rectal cancer Neg Hx   . Stomach cancer Neg Hx     Social History   Socioeconomic History  . Marital  status: Married    Spouse name: Not on file  . Number of children: Not on file  . Years of education: Not on file  . Highest education level: Not on file  Occupational History  . Not on file  Tobacco Use  . Smoking status: Former Smoker    Quit date: 09/13/1990    Years since quitting: 28.9  . Smokeless tobacco: Never Used  Substance and Sexual Activity  . Alcohol use: No  . Drug use: No  . Sexual activity: Yes    Birth control/protection: Surgical  Other Topics Concern  . Not on file  Social History Narrative   Disabled. Drinks 4-5 cups of tea daily. Does not get regular exercise.    Social Determinants of Health   Financial Resource Strain:   . Difficulty of Paying Living Expenses: Not on file  Food Insecurity:   . Worried About Charity fundraiser in the Last Year: Not on file  . Ran Out of Food in the Last Year: Not on file  Transportation Needs:   . Lack of Transportation (Medical): Not on file  . Lack of Transportation (Non-Medical): Not on file  Physical Activity:   .  Days of Exercise per Week: Not on file  . Minutes of Exercise per Session: Not on file  Stress:   . Feeling of Stress : Not on file  Social Connections:   . Frequency of Communication with Friends and Family: Not on file  . Frequency of Social Gatherings with Friends and Family: Not on file  . Attends Religious Services: Not on file  . Active Member of Clubs or Organizations: Not on file  . Attends Archivist Meetings: Not on file  . Marital Status: Not on file  Intimate Partner Violence:   . Fear of Current or Ex-Partner: Not on file  . Emotionally Abused: Not on file  . Physically Abused: Not on file  . Sexually Abused: Not on file    Outpatient Medications Prior to Visit  Medication Sig Dispense Refill  . ACCU-CHEK SOFTCLIX LANCETS lancets USE AS DIRECTED BID  5  . aspirin EC 81 MG tablet Take by mouth.    Marland Kitchen atorvastatin (LIPITOR) 20 MG tablet TK 1 T PO QD  12  . cetirizine  (ZYRTEC) 10 MG tablet TK 1 T PO QD  5  . gabapentin (NEURONTIN) 300 MG capsule Take 300 mg by mouth daily.    . methocarbamol (ROBAXIN) 500 MG tablet Take 500 mg by mouth every 8 (eight) hours as needed.  2  . naproxen sodium (ALEVE) 220 MG tablet Take 440 mg by mouth daily as needed (for pain).    . OXTELLAR XR 300 MG TB24 TK 2 TS PO QD FOR EPILEPSY  1  . OZEMPIC, 1 MG/DOSE, 2 MG/1.5ML SOPN Inject 1 mg into the skin once a week.    . propranolol (INDERAL) 20 MG tablet TAKE 1/2 TABLET BY MOUTH TWICE DAILY FOR 1 WEEK. THEN 1 TABLET BY MOUTH TWICE DAILY THEREAFTER  2   Facility-Administered Medications Prior to Visit  Medication Dose Route Frequency Provider Last Rate Last Admin  . 0.9 %  sodium chloride infusion  500 mL Intravenous Once Irene Shipper, MD        Allergies  Allergen Reactions  . Cymbalta [Duloxetine Hcl] Itching    Nasal itching  . Propoxyphene N-Acetaminophen Itching    ROS Review of Systems  Constitutional: Negative.   Eyes: Negative for itching.  Respiratory: Negative.   Cardiovascular: Negative.   Neurological: Positive for headaches.      Objective:    Physical Exam  Constitutional: She is oriented to person, place, and time. She appears well-developed and well-nourished.  HENT:  Head: Normocephalic and atraumatic.  Eyes: Conjunctivae are normal.  Cardiovascular: Normal rate and regular rhythm.  Pulmonary/Chest: Effort normal and breath sounds normal.  Neurological: She is oriented to person, place, and time.    BP (!) 150/98 (BP Location: Left Arm, Patient Position: Sitting, Cuff Size: Normal)   Pulse 89   Temp 98.5 F (36.9 C) (Oral)   Ht 5\' 2"  (1.575 m)   Wt 193 lb (87.5 kg)   SpO2 95%   BMI 35.30 kg/m  Wt Readings from Last 3 Encounters:  08/29/19 193 lb (87.5 kg)  08/21/19 188 lb 9.6 oz (85.5 kg)  08/09/18 189 lb (85.7 kg)     Health Maintenance Due  Topic Date Due  . Hepatitis C Screening  [birth year]1962  . PNEUMOCOCCAL POLYSACCHARIDE  VACCINE AGE 50-64 HIGH RISK  01/07/1965  . FOOT EXAM  01/07/1973  . OPHTHALMOLOGY EXAM  01/07/1973  . URINE MICROALBUMIN  01/07/1973  . HIV Screening  01/07/1978  .  TETANUS/TDAP  01/07/1982  . PAP SMEAR-Modifier  01/08/1984  . INFLUENZA VACCINE  04/14/2019    There are no preventive care reminders to display for this patient.  Lab Results  Component Value Date   TSH 1.23 07/07/2016   Lab Results  Component Value Date   WBC 7.9 07/18/2016   HGB 14.5 07/18/2016   HCT 41.6 07/18/2016   MCV 87.0 07/18/2016   PLT 185 07/18/2016   Lab Results  Component Value Date   NA 139 04/25/2019   K 4.1 04/25/2019   CO2 25 (A) 04/25/2019   GLUCOSE 281 (H) 07/18/2016   BUN 10 04/25/2019   CREATININE 0.9 04/25/2019   BILITOT 0.4 07/18/2016   ALKPHOS 100 01/04/2018   AST 16 01/04/2018   ALT 25 01/04/2018   PROT 7.0 07/18/2016   ALBUMIN 4.0 07/18/2016   CALCIUM 9.7 04/25/2019   ANIONGAP 8 07/18/2016   Lab Results  Component Value Date   CHOL 166 01/04/2018   Lab Results  Component Value Date   HDL 49 01/04/2018   Lab Results  Component Value Date   LDLCALC 79 01/04/2018   Lab Results  Component Value Date   TRIG 188 (A) 01/04/2018   No results found for: [facility] Lab Results  Component Value Date   HGBA1C 7.4 (A) 08/21/2019      Assessment & Plan:  1. Essential hypertension Propranolol increase dose to 40mg  BID-pt currently taking 20mg  BID-take blood pressure readings  Follow-up: 1 month-HTN   Hannah Beat, MD

## 2019-08-29 NOTE — Patient Instructions (Addendum)
Increase propranolol to 40mg  twice a day Continued to take ozempic weekly-check glucose readings at night and fasting Get blood work completed prior to follow up See neurology for additional evaluation -migraine headaches

## 2019-09-05 ENCOUNTER — Telehealth: Payer: Self-pay | Admitting: Family Medicine

## 2019-09-28 ENCOUNTER — Telehealth: Payer: Self-pay

## 2019-09-28 DIAGNOSIS — E1169 Type 2 diabetes mellitus with other specified complication: Secondary | ICD-10-CM

## 2019-09-28 DIAGNOSIS — I10 Essential (primary) hypertension: Secondary | ICD-10-CM

## 2019-09-28 DIAGNOSIS — R5382 Chronic fatigue, unspecified: Secondary | ICD-10-CM

## 2019-09-28 NOTE — Telephone Encounter (Signed)
LeighAnn Willisha Sligar, CMA  

## 2019-09-29 LAB — TSH: TSH: 2.25 u[IU]/mL (ref 0.450–4.500)

## 2019-09-29 LAB — LIPID PANEL
Chol/HDL Ratio: 3.6 ratio (ref 0.0–4.4)
Cholesterol, Total: 180 mg/dL (ref 100–199)
HDL: 50 mg/dL (ref >39)
LDL Chol Calc (NIH): 90 mg/dL (ref 0–99)
Triglycerides: 240 mg/dL — ABNORMAL HIGH (ref 0–149)
VLDL Cholesterol Cal: 40 mg/dL (ref 5–40)

## 2019-09-29 LAB — CBC
Hematocrit: 43 % (ref 34.0–46.6)
Hemoglobin: 15.1 g/dL (ref 11.1–15.9)
MCH: 31 pg (ref 26.6–33.0)
MCHC: 35.1 g/dL (ref 31.5–35.7)
MCV: 88 fL (ref 79–97)
Platelets: 187 10*3/uL (ref 150–450)
RBC: 4.87 x10E6/uL (ref 3.77–5.28)
RDW: 12.7 % (ref 11.7–15.4)
WBC: 6.5 10*3/uL (ref 3.4–10.8)

## 2019-10-01 ENCOUNTER — Encounter: Payer: Self-pay | Admitting: Family Medicine

## 2019-10-01 ENCOUNTER — Other Ambulatory Visit: Payer: Self-pay

## 2019-10-01 ENCOUNTER — Telehealth (INDEPENDENT_AMBULATORY_CARE_PROVIDER_SITE_OTHER): Payer: Medicare PPO | Admitting: Family Medicine

## 2019-10-01 VITALS — BP 150/98 | Ht 62.0 in | Wt 193.0 lb

## 2019-10-01 DIAGNOSIS — G8929 Other chronic pain: Secondary | ICD-10-CM | POA: Diagnosis not present

## 2019-10-01 DIAGNOSIS — R519 Headache, unspecified: Secondary | ICD-10-CM

## 2019-10-01 DIAGNOSIS — F339 Major depressive disorder, recurrent, unspecified: Secondary | ICD-10-CM

## 2019-10-01 DIAGNOSIS — I10 Essential (primary) hypertension: Secondary | ICD-10-CM | POA: Diagnosis not present

## 2019-10-01 NOTE — Progress Notes (Signed)
Virtual Visit via Telephone Note  I connected with [place] on 10/01/19 at  3:40 PM EST by telephone and verified that I am speaking with the correct person using two identifiers. DOB/address  Location: Patient: home Provider: office   I discussed the limitations, risks, security and privacy concerns of performing an evaluation and management service by telephone and the availability of in person appointments. I also discussed with the patient that there may be a patient responsible charge related to this service. The patient expressed understanding and agreed to proceed.   History of Present Illness: Pt taking propranolol 40mg  BID with improvement in headaches as dose increased of blood pressure medications. Pt states she was seen by [facility] neurosurgery and they did not advise a change in her shunt.  Pt states blood pressure at home 147/90 today. Pt has decided to no longer take Botox for headaches.   Observations/Objective: 147/90  Assessment and Plan: 1. Chronic intractable headache, unspecified headache type Pt will have follow up with neuro-concern for shunt as blood pressure with improved control. Pt with long term concerns with headaches causing depression due to worsening quality of life.   - Ambulatory referral to Neurology - Ambulatory referral to Psychiatry  2. Depression, recurrent ([place]) - Ambulatory referral to Psychiatry  3. Essential hypertension Propanolol 40mg  +20mg (previous dose)= 60mg  propanolol   Follow Up Instructions: Psy/counseling appt in [place]-chronic depression related to chronic disease Neuro appt-concern for ongoing headaches with shunt-paperwork from [facility] neurosurgery dropped off at neurology for review. Pt needs follow up concerning shunt, headaches and recommendations for treatment   I discussed the assessment and treatment plan with the patient. The patient was provided an opportunity to ask questions and all were answered. The patient  agreed with the plan and demonstrated an understanding of the instructions.   The patient was advised to call back or seek an in-person evaluation if the symptoms worsen or if the condition fails to improve as anticipated.  I provided 95minutes of non-face-to-face time during this encounter.   Marja Adderley Hannah Beat, MD

## 2019-10-01 NOTE — Patient Instructions (Signed)
Increase dose to 40mg  + 20mg  =60mg  twice a day Appointment with neurology asap Counseling referral-[place]

## 2019-10-05 DIAGNOSIS — F339 Major depressive disorder, recurrent, unspecified: Secondary | ICD-10-CM | POA: Insufficient documentation

## 2019-10-09 ENCOUNTER — Ambulatory Visit (INDEPENDENT_AMBULATORY_CARE_PROVIDER_SITE_OTHER): Payer: Medicare PPO | Admitting: Clinical

## 2019-10-09 ENCOUNTER — Other Ambulatory Visit: Payer: Self-pay

## 2019-10-09 DIAGNOSIS — F332 Major depressive disorder, recurrent severe without psychotic features: Secondary | ICD-10-CM
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
## 2019-10-30 ENCOUNTER — Ambulatory Visit (INDEPENDENT_AMBULATORY_CARE_PROVIDER_SITE_OTHER): Payer: Medicare PPO | Admitting: Clinical

## 2019-10-30 ENCOUNTER — Other Ambulatory Visit: Payer: Self-pay

## 2019-10-30 DIAGNOSIS — F332 Major depressive disorder, recurrent severe without psychotic features: Secondary | ICD-10-CM

## 2019-10-30 NOTE — Progress Notes (Signed)
Virtual Visit via Video Note  I connected with [place] on 10/30/19 at  1:00 PM EST by a video enabled telemedicine application and verified that I am speaking with the correct person using two identifiers.  Location: Patient: Home Provider: Office   I discussed the limitations of evaluation and management by telemedicine and the availability of in person appointments. The patient expressed understanding and agreed to proceed.      THERAPIST PROGRESS NOTE  Session Time: 1:00PM-1:40PM  Participation Level: Active  Behavioral Response: CasualAlertDepressed  Type of Therapy: Individual Therapy  Treatment Goals addressed: Diagnosis: Depression  Interventions: CBT  Summary: Amanda Webster is a 59 y.o. female who presents with Depression. The OPT therapist worked with the patient for her initial session. The OPT therapist utilized Motivational Interviewing to assist in creating therapeutic repore. The patient in the session was engaged and work in collaboration giving feedback about her triggers and symptoms over the past few weeks including her health struggles and the impact of her physical health on her mood. The OPT therapist utilized Cognitive Behavioral Therapy through cognitive restructuring as well as worked with the patient on coping strategies to assist in management of Depression. The OPT therapist inquired for holistic care about the patients adherence to medication therapy.  Suicidal/Homicidal: Nowithout intent/plan  Therapist Response: The OPT therapist worked with the patient for the patients initial scheduled session. The patient was engaged in her session and gave feedback in relation to triggers, symptoms, and behavior responses over the past 2 weeks. The OPT therapist worked with the patient utilizing an in session Cognitive Behavioral Therapy exercise. The patient was responsive in the session and verbalized, " I am willing to get out of the house more and  help break my routine". The patient indicated both compliance and effectiveness in relation to her current medication therapy. The OPT therapist will continue treatment work with the patient in her next scheduled session.  Plan: Return again in 2 weeks.  Diagnosis: Axis I: Major depressive disorder, recurrent severe without psychotic features    Axis II: No diagnosis  I discussed the assessment and treatment plan with the patient. The patient was provided an opportunity to ask questions and all were answered. The patient agreed with the plan and demonstrated an understanding of the instructions.   The patient was advised to call back or seek an in-person evaluation if the symptoms worsen or if the condition fails to improve as anticipated.  I provided 40 minutes of non-face-to-face time during this encounter.  [name], LCSW 10/30/2019

## 2019-10-31 ENCOUNTER — Other Ambulatory Visit: Payer: Self-pay | Admitting: Emergency Medicine

## 2019-10-31 ENCOUNTER — Telehealth: Payer: Self-pay | Admitting: Family Medicine

## 2019-10-31 DIAGNOSIS — I10 Essential (primary) hypertension: Secondary | ICD-10-CM

## 2019-10-31 DIAGNOSIS — G8929 Other chronic pain: Secondary | ICD-10-CM

## 2019-10-31 MED ORDER — PROPRANOLOL HCL 40 MG PO TABS: 40.0000 mg | ORAL_TABLET | Freq: Two times a day (BID) | ORAL | 1 refills | Status: DC

## 2019-10-31 NOTE — Telephone Encounter (Signed)
Refill has been sent.  °

## 2019-10-31 NOTE — Telephone Encounter (Signed)
Patient is calling and states that she needs a refill on propranolol (INDERAL) 40 MG tablet. She was told to combine the 40mg  and the left over 20mg  that she had to make 60mg  2x a day. She states she would like a 60mg  pill call into the pharmacy instead of having to break the 40mg  tablets.   [id number] - [place], [place] [address] Phone:  [phone]  Fax:  [phone]

## 2019-11-12 ENCOUNTER — Other Ambulatory Visit: Payer: Self-pay | Admitting: Family Medicine

## 2019-11-12 DIAGNOSIS — Z1231 Encounter for screening mammogram for malignant neoplasm of breast: Secondary | ICD-10-CM

## 2019-11-14 ENCOUNTER — Ambulatory Visit
Admission: RE | Admit: 2019-11-14 | Discharge: 2019-11-14 | Disposition: A | Discharge status: Home / Self Care | Payer: Medicare PPO | Source: Ambulatory Visit | Attending: Family Medicine | Admitting: Family Medicine

## 2019-11-14 ENCOUNTER — Other Ambulatory Visit: Payer: Self-pay

## 2019-11-14 DIAGNOSIS — Z1231 Encounter for screening mammogram for malignant neoplasm of breast: Secondary | ICD-10-CM

## 2019-11-22 ENCOUNTER — Encounter: Payer: Self-pay | Admitting: Family Medicine

## 2019-11-22 ENCOUNTER — Other Ambulatory Visit: Payer: Self-pay | Admitting: Family Medicine

## 2019-11-22 ENCOUNTER — Ambulatory Visit: Payer: Medicare PPO | Admitting: Family Medicine

## 2019-11-22 ENCOUNTER — Other Ambulatory Visit: Payer: Self-pay

## 2019-11-22 VITALS — BP 130/72 | HR 73 | Temp 98.5°F | Ht 62.0 in | Wt 195.2 lb

## 2019-11-22 DIAGNOSIS — IMO0002 Reserved for concepts with insufficient information to code with codable children: Secondary | ICD-10-CM

## 2019-11-22 DIAGNOSIS — E0865 Diabetes mellitus due to underlying condition with hyperglycemia: Secondary | ICD-10-CM | POA: Diagnosis not present

## 2019-11-22 DIAGNOSIS — Z794 Long term (current) use of insulin: Secondary | ICD-10-CM

## 2019-11-22 DIAGNOSIS — E1169 Type 2 diabetes mellitus with other specified complication: Secondary | ICD-10-CM | POA: Diagnosis not present

## 2019-11-22 DIAGNOSIS — E785 Hyperlipidemia, unspecified: Secondary | ICD-10-CM

## 2019-11-22 DIAGNOSIS — E08618 Diabetes mellitus due to underlying condition with other diabetic arthropathy: Secondary | ICD-10-CM

## 2019-11-22 DIAGNOSIS — L821 Other seborrheic keratosis: Secondary | ICD-10-CM | POA: Diagnosis not present

## 2019-11-22 DIAGNOSIS — I10 Essential (primary) hypertension: Secondary | ICD-10-CM | POA: Diagnosis not present

## 2019-11-22 LAB — POCT GLYCOSYLATED HEMOGLOBIN (HGB A1C): Hemoglobin A1C: 7.6 % — AB (ref 4.0–5.6)

## 2019-11-22 MED ORDER — PROPRANOLOL HCL 60 MG PO TABS: 60.0000 mg | ORAL_TABLET | Freq: Two times a day (BID) | ORAL | 1 refills | Status: DC

## 2019-11-22 MED ORDER — FLUTICASONE PROPIONATE 50 MCG/ACT NA SUSP: 1.0000 | Freq: Every day | NASAL | 5 refills | Status: DC

## 2019-11-22 MED ORDER — CETIRIZINE HCL 10 MG PO TABS: ORAL_TABLET | ORAL | 5 refills | Status: DC

## 2019-11-22 MED ORDER — GLIPIZIDE 5 MG PO TABS: 5.0000 mg | ORAL_TABLET | Freq: Two times a day (BID) | ORAL | 3 refills | Status: DC

## 2019-11-22 NOTE — Progress Notes (Signed)
Established Patient Office Visit  Subjective:  Patient ID: Amanda Webster, female    DOB: [birth year]  Age: 59 y.o. MRN: [id number]  CC:  Chief Complaint  Patient presents with  . skin lesion    skin lesion on the left side of back. Been there for weeks but just started picking at it few weeks ago  . Medication Refill    need refill on nasal spray and allergy med    HPI [employer] presents for skin lesion DM-diagnosed several years ago-started ozempic 1mg  weekly-pt states she started this fall. Nausea with metformin.    Diabetic shoes-podiatry measured feet for shoes  Neurology-following pt for seizure d/o and headaches-pt continues to get Botox treatments. Pt seeing neurology on a regular basis. Recently changed seizure medication. Oxtellar.    HTN-propanolol 60mg  BID-working well for blood pressure and headaches. Pt states improvement in occurrence and intensity of headaches. Pt followed for headache and seizure treatment by neurology  AR-flonase and zyrtec daily  Past Medical History:  Diagnosis Date  . Allergy   . Anxiety   . Arnold-Chiari malformation ([place])   . Arthritis   . Borderline diabetic   . Chronic headaches   . Colon polyps    nonadenomatous  . Depression   . Diabetes mellitus without complication ([place])   . Diverticulosis   . Esophageal stricture   . GERD (gastroesophageal reflux disease)   . HTN (hypertension)   . Hyperlipidemia   . IBS (irritable bowel syndrome)   . Seizures ([place])    pt not sure when her last seizure was- she says she just spaces out- no shaking but she could not tell me last seizure-  on Oxtellar bid for seizures   . Sleep apnea    no cpap now- did use 02 with cpap but has not used either in "years"    Past Surgical History:  Procedure Laterality Date  . BREAST BIOPSY Left   . cahri decompression  6 and 03/1999  . CHOLECYSTECTOMY    . COLONOSCOPY    . invasive cervical traction  02/2009  . left breast-lumpectomy    .  POLYPECTOMY    . shunt put in on right side of brain    . SHUNT REPLACEMENT  12/10  . TOTAL ABDOMINAL HYSTERECTOMY    . TUBAL LIGATION    . UPPER GASTROINTESTINAL ENDOSCOPY      Family History  Problem Relation Age of Onset  . Colon polyps Mother   . Breast cancer Mother   . Colon cancer Neg Hx   . Esophageal cancer Neg Hx   . Rectal cancer Neg Hx   . Stomach cancer Neg Hx     Social History   Socioeconomic History  . Marital status: Married    Spouse name: Not on file  . Number of children: Not on file  . Years of education: Not on file  . Highest education level: Not on file  Occupational History  . Not on file  Tobacco Use  . Smoking status: Former Smoker    Quit date: 09/13/1990    Years since quitting: 29.2  . Smokeless tobacco: Never Used  Substance and Sexual Activity  . Alcohol use: No  . Drug use: No  . Sexual activity: Yes    Birth control/protection: Surgical  Other Topics Concern  . Not on file  Social History Narrative   Disabled. Drinks 4-5 cups of tea daily. Does not get regular exercise.    Social  Determinants of Health   Financial Resource Strain:   . Difficulty of Paying Living Expenses:   Food Insecurity:   . Worried About Charity fundraiser in the Last Year:   . Arboriculturist in the Last Year:   Transportation Needs:   . Film/video editor (Medical):   Marland Kitchen Lack of Transportation (Non-Medical):   Physical Activity:   . Days of Exercise per Week:   . Minutes of Exercise per Session:   Stress:   . Feeling of Stress :   Social Connections:   . Frequency of Communication with Friends and Family:   . Frequency of Social Gatherings with Friends and Family:   . Attends Religious Services:   . Active Member of Clubs or Organizations:   . Attends Archivist Meetings:   Marland Kitchen Marital Status:   Intimate Partner Violence:   . Fear of Current or Ex-Partner:   . Emotionally Abused:   Marland Kitchen Physically Abused:   . Sexually Abused:      Outpatient Medications Prior to Visit  Medication Sig Dispense Refill  . ACCU-CHEK SOFTCLIX LANCETS lancets USE AS DIRECTED BID  5  . aspirin EC 81 MG tablet Take by mouth.    Marland Kitchen atorvastatin (LIPITOR) 20 MG tablet TK 1 T PO QD  12  . gabapentin (NEURONTIN) 300 MG capsule Take 300 mg by mouth daily.    . methocarbamol (ROBAXIN) 500 MG tablet Take 500 mg by mouth every 8 (eight) hours as needed.  2  . naproxen sodium (ALEVE) 220 MG tablet Take 440 mg by mouth daily as needed (for pain).    . OXTELLAR XR 300 MG TB24 TK 2 TS PO QD FOR EPILEPSY  1  . OZEMPIC, 1 MG/DOSE, 2 MG/1.5ML SOPN Inject 1 mg into the skin once a week.    . propranolol (INDERAL) 40 MG tablet Take 1 tablet (40 mg total) by mouth 2 (two) times daily. 60 tablet 1  . cetirizine (ZYRTEC) 10 MG tablet TK 1 T PO QD  5  . fluticasone (FLONASE) 50 MCG/ACT nasal spray Place into both nostrils daily.     Facility-Administered Medications Prior to Visit  Medication Dose Route Frequency Provider Last Rate Last Admin  . 0.9 %  sodium chloride infusion  500 mL Intravenous Once Irene Shipper, MD        Allergies  Allergen Reactions  . Cymbalta [Duloxetine Hcl] Itching    Nasal itching  . Propoxyphene N-Acetaminophen Itching    ROS Review of Systems  Constitutional: Negative.   Respiratory: Negative.   Cardiovascular: Negative.   Gastrointestinal:       Improved abdominal pain after stopping metformin  Endocrine:       Glucose at home-fasting ?152  Neurological: Positive for seizures.       Headaches improved on propranolol  Psychiatric/Behavioral: Negative.       Objective:    Physical Exam  Constitutional: She is oriented to person, place, and time. She appears well-developed and well-nourished.  HENT:  Head: Normocephalic and atraumatic.  Cardiovascular: Normal rate.  Pulmonary/Chest: Effort normal and breath sounds normal.  Neurological: She is oriented to person, place, and time.  Skin:     seb  keratosis  Psychiatric: She has a normal mood and affect.  Vitals reviewed.   BP 130/72 (BP Location: Right Arm, Patient Position: Sitting, Cuff Size: Large)   Pulse 73   Temp 98.5 F (36.9 C) (Temporal)   Ht 5\' 2"  (1.575  m)   Wt 195 lb 3.2 oz (88.5 kg)   SpO2 96%   BMI 35.70 kg/m  Wt Readings from Last 3 Encounters:  11/22/19 195 lb 3.2 oz (88.5 kg)  10/01/19 193 lb (87.5 kg)  08/29/19 193 lb (87.5 kg)     Health Maintenance Due  Topic Date Due  . Hepatitis C Screening  Never done  . PNEUMOCOCCAL POLYSACCHARIDE VACCINE AGE 28-64 HIGH RISK  Never done  . FOOT EXAM  Never done  . OPHTHALMOLOGY EXAM  Never done  . URINE MICROALBUMIN  Never done  . HIV Screening  Never done  . TETANUS/TDAP  Never done  . PAP SMEAR-Modifier  Never done  . INFLUENZA VACCINE  Never done    Lab Results  Component Value Date   TSH 2.250 09/28/2019   Lab Results  Component Value Date   WBC 6.5 09/28/2019   HGB 15.1 09/28/2019   HCT 43.0 09/28/2019   MCV 88 09/28/2019   PLT 187 09/28/2019   Lab Results  Component Value Date   NA 139 04/25/2019   K 4.1 04/25/2019   CO2 25 (A) 04/25/2019   GLUCOSE 281 (H) 07/18/2016   BUN 10 04/25/2019   CREATININE 0.9 04/25/2019   BILITOT 0.4 07/18/2016   ALKPHOS 100 01/04/2018   AST 16 01/04/2018   ALT 25 01/04/2018   PROT 7.0 07/18/2016   ALBUMIN 4.0 07/18/2016   CALCIUM 9.7 04/25/2019   ANIONGAP 8 07/18/2016   Lab Results  Component Value Date   CHOL 180 09/28/2019   Lab Results  Component Value Date   HDL 50 09/28/2019   Lab Results  Component Value Date   LDLCALC 90 09/28/2019   Lab Results  Component Value Date   TRIG 240 (H) 09/28/2019   Lab Results  Component Value Date   CHOLHDL 3.6 09/28/2019   Lab Results  Component Value Date   HGBA1C 7.4 (A) 08/21/2019      Assessment & Plan:   1. Essential hypertension proproanolol-BID-rx-improving headaches and blood pressure cmp urine 2. Hyperlipidemia associated with  type 2 diabetes mellitus ([place]) Atorvastatin-pt could not tolerate fish oil cmp 3. Uncontrolled diabetes mellitus due to underlying condition with diabetic arthropathy, with long-term current use of insulin ([facility]) ozempic-continue, take glucose readings fasting in the morning, glucotrol with food twice a day-rx-risk/benefit/side effects d/w pt - POCT glycosylated hemoglobin (Hb A1C) cmp 4. Seborrheic keratoses If redness or change in lesion removal-continue to observe Meds ordered this encounter  Medications  . cetirizine (ZYRTEC) 10 MG tablet    Sig: TK 1 T PO QD    Dispense:  30 tablet    Refill:  5  . fluticasone (FLONASE) 50 MCG/ACT nasal spray    Sig: Place 1 spray into both nostrils daily.    Dispense:  16 g    Refill:  5    Follow-up: 3 months  Tomeshia Pizzi Hannah Beat, MD

## 2019-11-22 NOTE — Patient Instructions (Addendum)
Take glucotrol 5mg  twice a day-check glucose reading fasting-TAKE PRIOR TO MEALS Side effect can be LOW SUGAR so take glucose reading if symptoms concerning Continue taking ozempic 1mg  once a week  Continue taking propranolol 60mg  twice a day  Labwork CMP

## 2019-11-23 LAB — COMPREHENSIVE METABOLIC PANEL
ALT: 23 IU/L (ref 0–32)
AST: 24 IU/L (ref 0–40)
Albumin/Globulin Ratio: 2.1 (ref 1.2–2.2)
Albumin: 4.6 g/dL (ref 3.8–4.9)
Alkaline Phosphatase: 141 IU/L — ABNORMAL HIGH (ref 39–117)
BUN/Creatinine Ratio: 9 (ref 9–23)
BUN: 8 mg/dL (ref 6–24)
Bilirubin Total: 0.3 mg/dL (ref 0.0–1.2)
CO2: 23 mmol/L (ref 20–29)
Calcium: 9.7 mg/dL (ref 8.7–10.2)
Chloride: 103 mmol/L (ref 96–106)
Creatinine, Ser: 0.85 mg/dL (ref 0.57–1.00)
GFR calc Af Amer: 89 mL/min/{1.73_m2} (ref >59)
GFR calc non Af Amer: 77 mL/min/{1.73_m2} (ref >59)
Globulin, Total: 2.2 g/dL (ref 1.5–4.5)
Glucose: 119 mg/dL — ABNORMAL HIGH (ref 65–99)
Potassium: 4.3 mmol/L (ref 3.5–5.2)
Sodium: 143 mmol/L (ref 134–144)
Total Protein: 6.8 g/dL (ref 6.0–8.5)

## 2019-11-23 LAB — SPECIMEN STATUS REPORT

## 2019-11-26 ENCOUNTER — Encounter: Payer: Self-pay | Admitting: Emergency Medicine

## 2019-11-26 ENCOUNTER — Other Ambulatory Visit: Payer: Self-pay | Admitting: Family Medicine

## 2019-11-26 DIAGNOSIS — R748 Abnormal levels of other serum enzymes: Secondary | ICD-10-CM

## 2019-11-26 NOTE — Progress Notes (Signed)
Pt has slightly elevated glucose which is consistent with diabetes with elevated A1c. You have an additional blood test that is slightly elevated. Please complete additional testing -FASTING

## 2019-12-11 IMAGING — DX DG ANKLE COMPLETE 3+V*L*
3 series · 3 of 3 positions shown · non-contrast
Comparison: None.

CLINICAL DATA: Medial ankle pain, no known injury, initial
encounter

EXAM:
LEFT ANKLE COMPLETE - 3+ VIEW

[ankle ap]
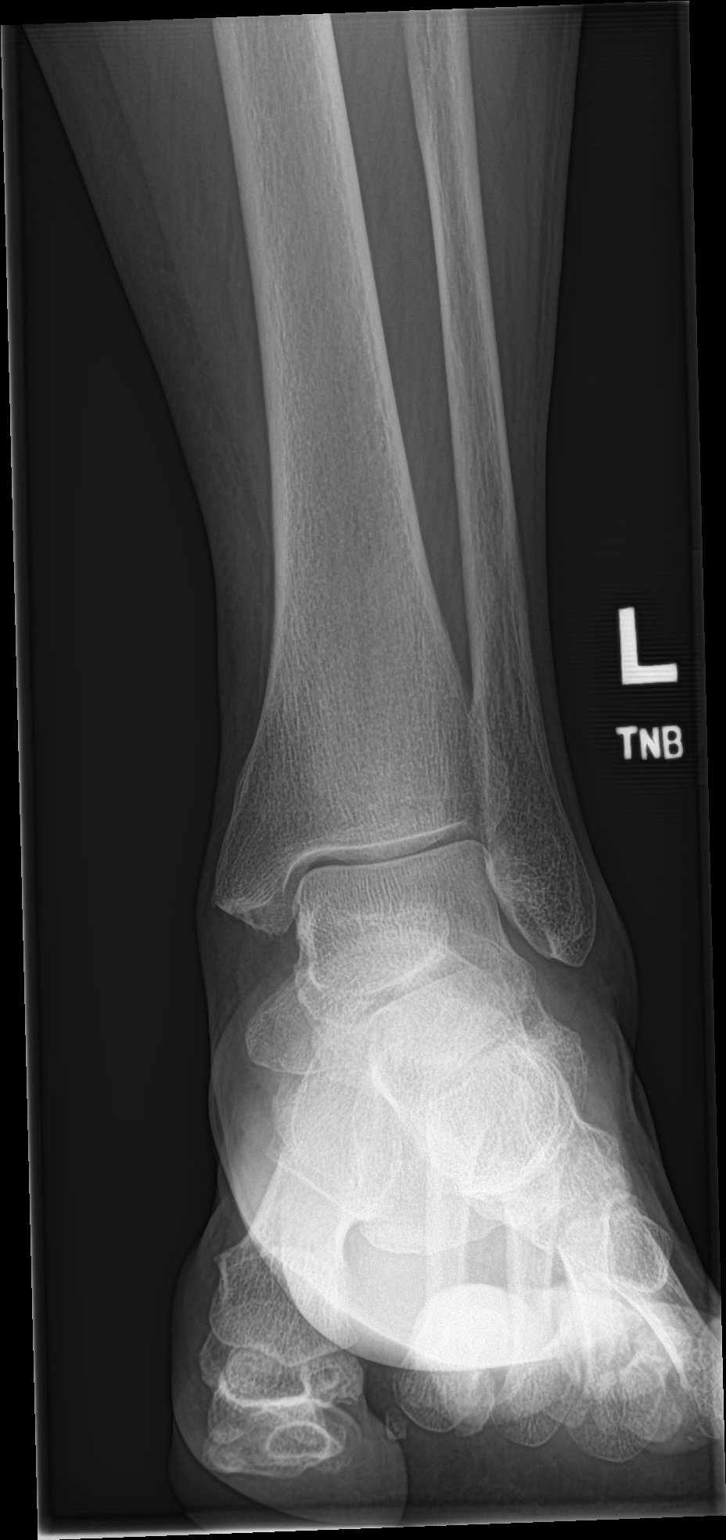

[ankle obl]
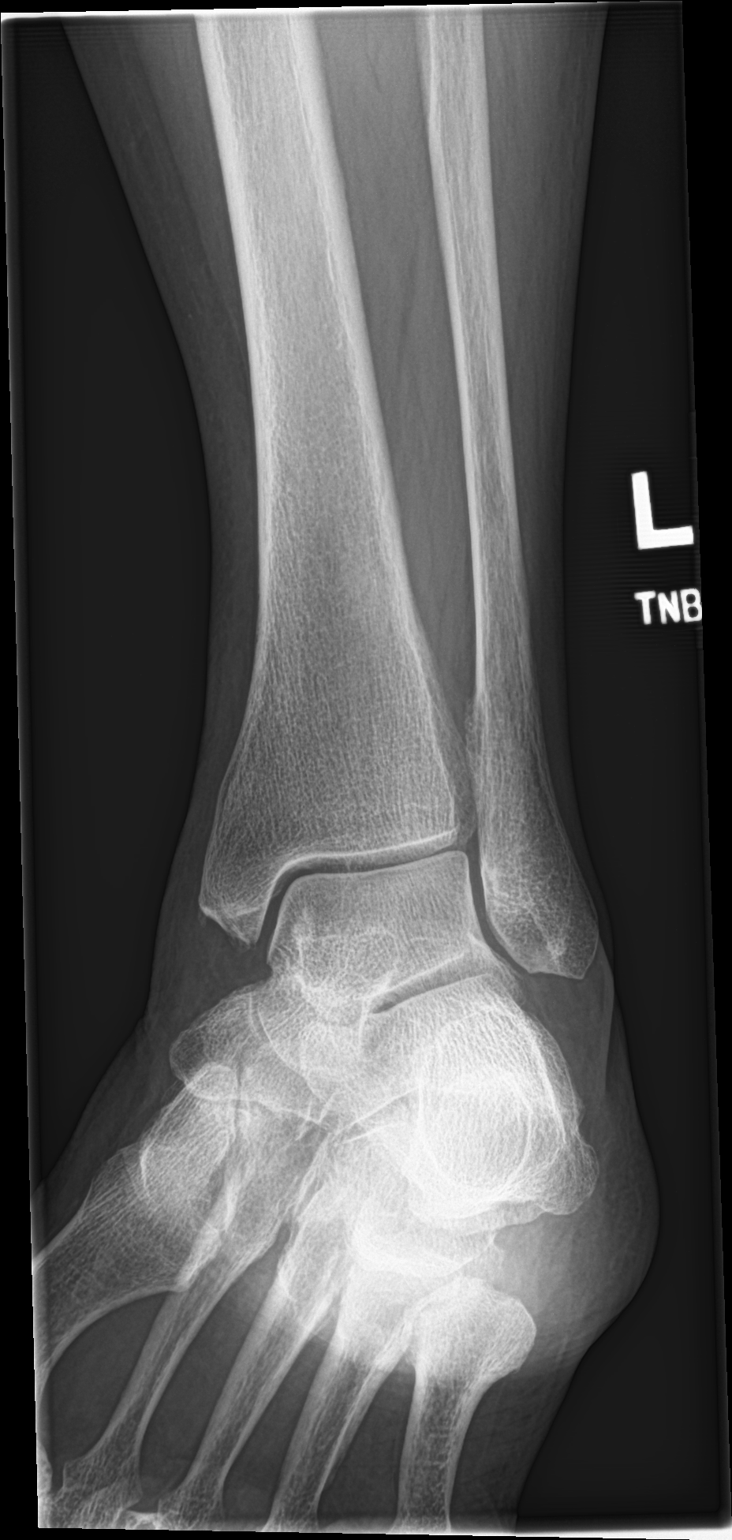

[ankle lat]
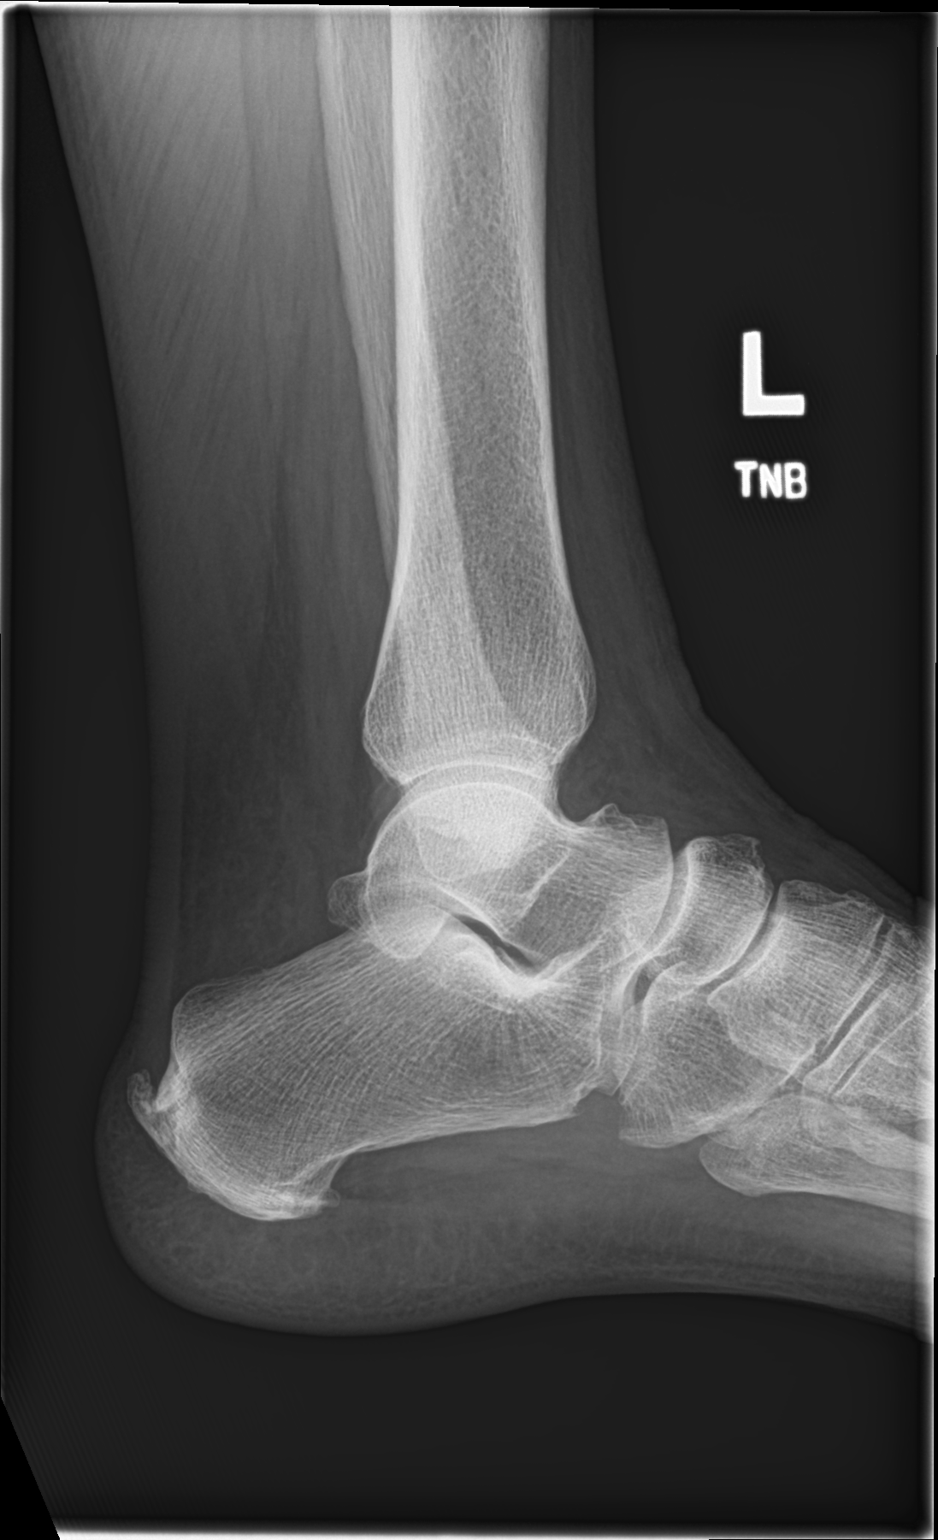

[3 of 3 positions shown; findings below may reference images not displayed]

FINDINGS: Mild calcaneal spurring is noted. No acute fracture or dislocation
is noted. No soft tissue changes are seen.
IMPRESSION: No acute abnormality noted.

## 2019-12-13 ENCOUNTER — Other Ambulatory Visit: Payer: Self-pay | Admitting: Family Medicine

## 2019-12-13 DIAGNOSIS — R748 Abnormal levels of other serum enzymes: Secondary | ICD-10-CM | POA: Diagnosis not present

## 2019-12-14 DIAGNOSIS — G43711 Chronic migraine without aura, intractable, with status migrainosus: Secondary | ICD-10-CM | POA: Diagnosis not present

## 2019-12-14 LAB — ALKALINE PHOSPHATASE: Alkaline Phosphatase: 139 IU/L — ABNORMAL HIGH (ref 39–117)

## 2019-12-14 LAB — GAMMA GT: GGT: 34 IU/L (ref 0–60)

## 2019-12-15 ENCOUNTER — Other Ambulatory Visit: Payer: Self-pay | Admitting: Family Medicine

## 2019-12-15 DIAGNOSIS — R519 Headache, unspecified: Secondary | ICD-10-CM

## 2019-12-15 DIAGNOSIS — I10 Essential (primary) hypertension: Secondary | ICD-10-CM

## 2019-12-18 ENCOUNTER — Other Ambulatory Visit: Payer: Self-pay | Admitting: Family Medicine

## 2019-12-18 DIAGNOSIS — R748 Abnormal levels of other serum enzymes: Secondary | ICD-10-CM

## 2019-12-18 NOTE — Progress Notes (Unsigned)
Elevated alk phos-GGT normal Bone specific Alk phos Concern for continued elevated  Unlike oxtellar or ozempic source of elevation

## 2019-12-19 ENCOUNTER — Telehealth: Payer: Self-pay | Admitting: Emergency Medicine

## 2019-12-19 DIAGNOSIS — R748 Abnormal levels of other serum enzymes: Secondary | ICD-10-CM | POA: Diagnosis not present

## 2019-12-19 NOTE — Telephone Encounter (Signed)
Pt is going to the lab today

## 2019-12-19 NOTE — Telephone Encounter (Signed)
  When speak to patient please give below msg from Dr. Holly Bodily, concern for continued elevation alk phos-GGT shows no liver involvement. Need to check for bone involvement to determine source of elevation. Additional labwork. Lab order has been placed please stop by the lab to get additional labs done

## 2019-12-21 LAB — ALKALINE PHOSPHATASE, BONE SPECIFIC: Tandem-R Ostase: 32.9 ug/L

## 2019-12-24 ENCOUNTER — Other Ambulatory Visit: Payer: Self-pay | Admitting: Emergency Medicine

## 2019-12-24 ENCOUNTER — Other Ambulatory Visit: Payer: Self-pay | Admitting: Family Medicine

## 2019-12-24 ENCOUNTER — Telehealth: Payer: Self-pay | Admitting: Family Medicine

## 2019-12-24 DIAGNOSIS — R748 Abnormal levels of other serum enzymes: Secondary | ICD-10-CM | POA: Diagnosis not present

## 2019-12-24 MED ORDER — ATORVASTATIN CALCIUM 20 MG PO TABS: ORAL_TABLET | ORAL | 3 refills | Status: DC

## 2019-12-24 NOTE — Telephone Encounter (Signed)
D/w pt results of labwork

## 2019-12-25 ENCOUNTER — Ambulatory Visit (INDEPENDENT_AMBULATORY_CARE_PROVIDER_SITE_OTHER): Payer: Medicare PPO | Admitting: Clinical

## 2019-12-25 ENCOUNTER — Other Ambulatory Visit: Payer: Self-pay | Admitting: Family Medicine

## 2019-12-25 ENCOUNTER — Other Ambulatory Visit: Payer: Self-pay

## 2019-12-25 DIAGNOSIS — F332 Major depressive disorder, recurrent severe without psychotic features: Secondary | ICD-10-CM

## 2019-12-25 DIAGNOSIS — R748 Abnormal levels of other serum enzymes: Secondary | ICD-10-CM

## 2019-12-25 LAB — VITAMIN D 25 HYDROXY (VIT D DEFICIENCY, FRACTURES): Vit D, 25-Hydroxy: 33 ng/mL (ref 30.0–100.0)

## 2019-12-25 NOTE — Progress Notes (Signed)
Virtual Visit via Video Note  I connected with Amanda Webster on 12/25/19 at 11:00 AM EDT by a video enabled telemedicine application and verified that I am speaking with the correct person using two identifiers.  Location: Patient: Home Provider: Office   I discussed the limitations of evaluation and management by telemedicine and the availability of in person appointments. The patient expressed understanding and agreed to proceed.    THERAPIST PROGRESS NOTE  Session Time: 11:00AM-11:40AM  Participation Level: Active  Behavioral Response: CasualAlertDepressed  Type of Therapy: Individual Therapy  Treatment Goals addressed: Coping  Interventions: CBT, Motivational Interviewing, Solution Focused and Supportive  Summary: Amanda Webster is a 59 y.o. female who presents with Depression. The OPT therapist worked with the patient for her ongoing OPT treatment. The OPT therapist utilized Motivational Interviewing to assist in creating therapeutic repore. The patient in the session was engaged and work in collaboration giving feedback about her triggers and symptoms over the past few weeks including her ongoing  health struggles and the impact of her physical health on her mood. The OPT therapist utilized Cognitive Behavioral Therapy through cognitive restructuring as well as worked with the patient on coping strategies to assist in management of Depression. The OPT therapist inquired for holistic care about the patients adherence to medication therapy.   Suicidal/Homicidal: Nowithout intent/plan  Therapist Response: The OPT therapist worked with the patient for the patients scheduled session. The patient was engaged in her session and gave feedback in relation to triggers, symptoms, and behavior responses over the past few weeks. The OPT therapist worked with the patient utilizing an in session Cognitive Behavioral Therapy exercise. The patient was responsive in the session and  verbalized, " I have been doing better about getting outside and this is helping I recently got some ducks I go out and feed them in the morning and I have been working in the yard now that the weather is better". The OPT therapist continued to promote the patient being active as well as involved with her support system (family members) and worked to continue to empower the patient. The patient indicated both compliance and effectiveness in relation to her current medication therapy. The OPT therapist will continue treatment work with the patient in her next scheduled session.    Plan: Return again in 2/3 weeks.  Diagnosis: Axis I: Major depressive disorder, recurrent severe without psychotic features    Axis II: No diagnosis   I discussed the assessment and treatment plan with the patient. The patient was provided an opportunity to ask questions and all were answered. The patient agreed with the plan and demonstrated an understanding of the instructions.   The patient was advised to call back or seek an in-person evaluation if the symptoms worsen or if the condition fails to improve as anticipated.  I provided 40 minutes of non-face-to-face time during this encounter.  Amanda Grumbles, LCSW 12/25/2019

## 2019-12-26 ENCOUNTER — Telehealth: Payer: Self-pay | Admitting: Emergency Medicine

## 2019-12-26 NOTE — Telephone Encounter (Signed)
Patient was informed the below msg on her lab work and she need to take 5,000 unit of vit d otc and recheck in a month

## 2019-12-26 NOTE — Telephone Encounter (Signed)
-----   Message from Maryruth Hancock, MD sent at 12/25/2019 11:00 AM EDT ----- Low normal Vit D-take 5000IU daily Have blood work recheck in 1 month for elevated test

## 2020-01-14 ENCOUNTER — Telehealth: Payer: Self-pay | Admitting: Family Medicine

## 2020-01-14 NOTE — Telephone Encounter (Signed)
Please advise 

## 2020-01-14 NOTE — Telephone Encounter (Signed)
Patient is calling and states her pharmacy is out of  [place], 1 MG/DOSE, 2 MG/1.5ML SOPN  And they told her that they would call her when they found a store that had it.   She is needing to know if its okay to take glipiZIDE (GLUCOTROL) 5 MG tablet Without being able to take the shot.  She is requesting call back as she is supposed to take this before meals.   [phone]

## 2020-01-14 NOTE — Telephone Encounter (Signed)
Yes-pt should take the oral medication-glucotrol

## 2020-01-14 NOTE — Telephone Encounter (Signed)
Left message on voice mail to advise pt.

## 2020-01-15 ENCOUNTER — Other Ambulatory Visit: Payer: Self-pay

## 2020-01-15 ENCOUNTER — Ambulatory Visit (INDEPENDENT_AMBULATORY_CARE_PROVIDER_SITE_OTHER): Payer: Medicare PPO | Admitting: Clinical

## 2020-01-15 DIAGNOSIS — F332 Major depressive disorder, recurrent severe without psychotic features: Secondary | ICD-10-CM | POA: Diagnosis not present

## 2020-01-15 NOTE — Progress Notes (Signed)
Virtual Visit via Telephone Note  I connected with Amanda Webster on 01/15/20 at  1:00 PM EDT by telephone and verified that I am speaking with the correct person using two identifiers.  Location: Patient: Home Provider: Office    I discussed the limitations, risks, security and privacy concerns of performing an evaluation and management service by telephone and the availability of in person appointments. I also discussed with the patient that there may be a patient responsible charge related to this service. The patient expressed understanding and agreed to proceed.      .     THERAPIST PROGRESS NOTE  Session Time: 1:00PM-1:45PM  Participation Level: Active  Behavioral Response: CasualAlertDepressed  Type of Therapy: Individual Therapy  Treatment Goals addressed: Coping  Interventions: CBT, Motivational Interviewing and Strength-based  Summary: Amanda Webster is a 59 y.o. female who presents with Depression.The OPT therapist worked with thepatientfor herongoing OPT treatment. The OPT therapist utilized Motivational Interviewing to assist in creating therapeutic repore. The patient in the session was engaged and work in Science writer about hertriggers and symptoms over the past few weeksincluding her ongoing  health struggles and her interaction with a significant other, as well as her difficulty with letting go of triggering trust situations from her past. The OPT therapist utilized Public relations account executive Therapy through cognitive restructuring as well as worked with the patient on coping strategies to assist in management ofDepression.   Suicidal/Homicidal: Nowithout intent/plan  Therapist Response: The OPT therapist worked with the patient for the patients scheduled session. The patient was engaged in hersession and gave feedback in relation to triggers, symptoms, and behavior responses over the past few weeks. The OPT therapist worked with the  patient utilizing an in session Cognitive Behavioral Therapy exercise. The patient was responsive in the session and verbalized, " I want to stop carrying baggage, I have continued to get up out of the bed and be more mobile, I have been working yard on cleaning the leafs and doing yardwork". The OPT therapist continued to promote the patient being active as well as involved with her support system (family members) and worked to continue to empower the patient. The OPT therapist will continue treatment work with the patient in hernext scheduled session.    Plan: Return again in 2/3 weeks.  Diagnosis: Axis I:Major depressive disorder, recurrent severe without psychotic featuresMajor depressive disorder, recurrent severe without psychotic features    Axis II: No diagnosis  I discussed the assessment and treatment plan with the patient. The patient was provided an opportunity to ask questions and all were answered. The patient agreed with the plan and demonstrated an understanding of the instructions.   The patient was advised to call back or seek an in-person evaluation if the symptoms worsen or if the condition fails to improve as anticipated.  I provided 45 minutes of non-face-to-face time during this encounter  Amanda Webster, Marlinda Mike 01/15/2020

## 2020-01-17 ENCOUNTER — Other Ambulatory Visit: Payer: Self-pay

## 2020-01-17 ENCOUNTER — Telehealth: Payer: Self-pay | Admitting: Family Medicine

## 2020-01-17 DIAGNOSIS — R748 Abnormal levels of other serum enzymes: Secondary | ICD-10-CM

## 2020-01-17 NOTE — Telephone Encounter (Signed)
Patient called and states she was told to get her Vitamin D level checked again next week and she is needing an order put in for [facility]

## 2020-01-17 NOTE — Telephone Encounter (Signed)
Order put in for [facility]

## 2020-01-23 DIAGNOSIS — R748 Abnormal levels of other serum enzymes: Secondary | ICD-10-CM | POA: Diagnosis not present

## 2020-01-23 DIAGNOSIS — I10 Essential (primary) hypertension: Secondary | ICD-10-CM | POA: Diagnosis not present

## 2020-01-23 DIAGNOSIS — G43711 Chronic migraine without aura, intractable, with status migrainosus: Secondary | ICD-10-CM | POA: Diagnosis not present

## 2020-01-23 DIAGNOSIS — G44221 Chronic tension-type headache, intractable: Secondary | ICD-10-CM | POA: Diagnosis not present

## 2020-01-23 DIAGNOSIS — E1343 Other specified diabetes mellitus with diabetic autonomic (poly)neuropathy: Secondary | ICD-10-CM | POA: Diagnosis not present

## 2020-01-24 LAB — VITAMIN D 25 HYDROXY (VIT D DEFICIENCY, FRACTURES): Vit D, 25-Hydroxy: 35.7 ng/mL (ref 30.0–100.0)

## 2020-01-28 ENCOUNTER — Other Ambulatory Visit: Payer: Self-pay

## 2020-01-28 ENCOUNTER — Encounter (INDEPENDENT_AMBULATORY_CARE_PROVIDER_SITE_OTHER): Payer: Self-pay | Admitting: Internal Medicine

## 2020-01-28 ENCOUNTER — Ambulatory Visit (INDEPENDENT_AMBULATORY_CARE_PROVIDER_SITE_OTHER): Payer: Medicare PPO | Admitting: Internal Medicine

## 2020-01-28 VITALS — BP 125/75 | HR 62 | Temp 99.1°F | Ht 62.0 in | Wt 191.8 lb

## 2020-01-28 DIAGNOSIS — I10 Essential (primary) hypertension: Secondary | ICD-10-CM

## 2020-01-28 DIAGNOSIS — E785 Hyperlipidemia, unspecified: Secondary | ICD-10-CM | POA: Diagnosis not present

## 2020-01-28 DIAGNOSIS — E559 Vitamin D deficiency, unspecified: Secondary | ICD-10-CM

## 2020-01-28 DIAGNOSIS — E1169 Type 2 diabetes mellitus with other specified complication: Secondary | ICD-10-CM | POA: Diagnosis not present

## 2020-01-28 DIAGNOSIS — E0865 Diabetes mellitus due to underlying condition with hyperglycemia: Secondary | ICD-10-CM | POA: Diagnosis not present

## 2020-01-28 DIAGNOSIS — G4730 Sleep apnea, unspecified: Secondary | ICD-10-CM | POA: Diagnosis not present

## 2020-01-28 DIAGNOSIS — E08618 Diabetes mellitus due to underlying condition with other diabetic arthropathy: Secondary | ICD-10-CM

## 2020-01-28 DIAGNOSIS — Z794 Long term (current) use of insulin: Secondary | ICD-10-CM

## 2020-01-28 DIAGNOSIS — Z6835 Body mass index (BMI) 35.0-35.9, adult: Secondary | ICD-10-CM | POA: Diagnosis not present

## 2020-01-28 DIAGNOSIS — IMO0002 Reserved for concepts with insufficient information to code with codable children: Secondary | ICD-10-CM

## 2020-01-28 NOTE — Progress Notes (Signed)
Metrics: Intervention Frequency ACO  Documented Smoking Status Yearly  Screened one or more times in 24 months  Cessation Counseling or  Active cessation medication Past 24 months  Past 24 months   Guideline developer: UpToDate (See UpToDate for funding source) Date Released: 2014       Wellness Office Visit  Subjective:  Patient ID: Amanda Webster, female    DOB: 05-Nov-1960  Age: 59 y.o. MRN: [id number]  CC: This 59 year old lady comes in to establish care.  Her previous physician is leaving the practice and prior to that she was seen Dr. Sinda Du.  He has since retired. HPI  She has diabetes type 2 for many years. She also is hypertensive and obese. She also has seizure disorder. She apparently had Chiari malformation and had to have surgery. She describes hot flashes, night sweats likely resulting from postsurgical menopause which happened when she was in her early 37s and ovaries were removed apparently. She describes of constant fatigue. She has vitamin D deficiency and she is currently taking vitamin D3 5000 units daily which is not really improved her vitamin D level significantly. Past Medical History:  Diagnosis Date  . Allergy   . Anxiety   . Arnold-Chiari malformation ([place])   . Arthritis   . Borderline diabetic   . Chronic headaches   . Colon polyps    nonadenomatous  . Depression   . Diabetes mellitus without complication ([place])   . Diverticulosis   . Esophageal stricture   . GERD (gastroesophageal reflux disease)   . HTN (hypertension)   . Hyperlipidemia   . IBS (irritable bowel syndrome)   . Seizures ([place])    pt not sure when her last seizure was- she says she just spaces out- no shaking but she could not tell me last seizure-  on Oxtellar bid for seizures   . Sleep apnea    no cpap now- did use 02 with cpap but has not used either in "years"      Family History  Problem Relation Age of Onset  . Colon polyps Mother   . Breast cancer Mother     . Heart disease Father   . Stroke Father   . Colon cancer Neg Hx   . Esophageal cancer Neg Hx   . Rectal cancer Neg Hx   . Stomach cancer Neg Hx     Social History   Social History Narrative   Disabled from Chiari malformation/seizures.. Drinks 4-5 cups of tea daily. Does not get regular exercise. Married for 40 years.   Social History   Tobacco Use  . Smoking status: Former Smoker    Quit date: 09/13/1990    Years since quitting: 29.3  . Smokeless tobacco: Never Used  Substance Use Topics  . Alcohol use: No    Current Meds  Medication Sig  . ACCU-CHEK SOFTCLIX LANCETS lancets USE AS DIRECTED BID  . aspirin EC 81 MG tablet Take by mouth.  Marland Kitchen atorvastatin (LIPITOR) 20 MG tablet TK 1 T PO QD  . cetirizine (ZYRTEC) 10 MG tablet TK 1 T PO QD  . fluticasone (FLONASE) 50 MCG/ACT nasal spray Place 1 spray into both nostrils daily.  Marland Kitchen gabapentin (NEURONTIN) 300 MG capsule Take 300 mg by mouth daily.  Marland Kitchen glipiZIDE (GLUCOTROL) 5 MG tablet Take 1 tablet (5 mg total) by mouth 2 (two) times daily before a meal.  . methocarbamol (ROBAXIN) 500 MG tablet Take 500 mg by mouth every 8 (eight) hours as needed.  Marland Kitchen  naproxen sodium (ALEVE) 220 MG tablet Take 440 mg by mouth daily as needed (for pain).  . Oxcarbazepine (TRILEPTAL) 300 MG tablet Take 300 mg by mouth 3 (three) times daily.  Marland Kitchen OZEMPIC, 1 MG/DOSE, 2 MG/1.5ML SOPN Inject 1 mg into the skin once a week.  . propranolol (INDERAL) 60 MG tablet Take 1 tablet (60 mg total) by mouth 2 (two) times daily.  . [DISCONTINUED] OXTELLAR XR 300 MG TB24 TK 2 TS PO QD FOR EPILEPSY   Current Facility-Administered Medications for the 01/28/20 encounter (Office Visit) with [name], MD  Medication  . 0.9 %  sodium chloride infusion      Objective:   Today's Vitals: BP 125/75 (BP Location: Left Arm, Patient Position: Sitting, Cuff Size: Normal)   Pulse 62   Temp 99.1 F (37.3 C) (Temporal)   Ht 5\' 2"  (1.575 m)   Wt 191 lb 12.8 oz (87 kg)    SpO2 90%   BMI 35.08 kg/m  Vitals with BMI 01/28/2020 11/22/2019 10/01/2019  Height 5\' 2"  5\' 2"  5\' 2"   Weight 191 lbs 13 oz 195 lbs 3 oz 193 lbs  BMI 35.07 0000000 0000000  Systolic 0000000 [id number]  Diastolic 75 72 98  Pulse 62 73 -     Physical Exam  She is obese.  Blood pressure is under reasonable control.  She is alert and orientated without any obvious focal neurological signs.     Assessment   1. Essential hypertension   2. Uncontrolled diabetes mellitus due to underlying condition with diabetic arthropathy, with long-term current use of insulin ([place])   3. Hyperlipidemia associated with type 2 diabetes mellitus ([place])   4. Sleep apnea, unspecified type   5. Class 2 severe obesity due to excess calories with serious comorbidity and body mass index (BMI) of 35.0 to 35.9 in adult ([place])   6. Vitamin D deficiency disease       Tests ordered No orders of the defined types were placed in this encounter.    Plan: 1. I recommended that she increase vitamin D3 to 10,000 units daily. 2. She will continue with current antihypertensive medications which seem to control her blood pressure. 3. She will also continue on statin therapy for hyperlipidemia in the face of diabetes. 4. We discussed briefly diabetes and she tells me that she gets quite low blood sugars since being on glipizide so I told her to discontinue this as I do not think this is an optimal medication for treatment of type 2 diabetes.  She will continue with Ozempic once a week but we also discussed the concept briefly of intermittent fasting and if she can fast for 16 hours every day, I think she will start to get some results.  I gave her a diet sheet also we will discuss this in more detail the next visit. 5. She is also postsurgical menopause and has symptoms.  We will discuss this on the next visit in terms of hormone replacement therapy. 6. Follow-up in about 1 month.   No orders of the defined types were placed in  this encounter.   [name], MD

## 2020-01-28 NOTE — Patient Instructions (Signed)
[facility] Dietary Recommendations for Weight Loss What to Avoid . Avoid added sugars o Often added sugar can be found in processed foods such as many condiments, dry cereals, cakes, cookies, chips, crisps, crackers, candies, sweetened drinks, etc.  o Read labels and AVOID/DECREASE use of foods with the following in their ingredient list: Sugar, fructose, high fructose corn syrup, sucrose, glucose, maltose, dextrose, molasses, cane sugar, brown sugar, any type of syrup, agave nectar, etc.   . Avoid snacking in between meals . Avoid foods made with flour o If you are going to eat food made with flour, choose those made with whole-grains; and, minimize your consumption as much as is tolerable . Avoid processed foods o These foods are generally stocked in the middle of the grocery store. Focus on shopping on the perimeter of the grocery.  . Avoid Meat  o We recommend following a plant-based diet at [facility]. Thus, we recommend avoiding meat as a general rule. Consider eating beans, legumes, eggs, and/or dairy products for regular protein sources o If you plan on eating meat limit to 4 ounces of meat at a time and choose lean options such as Fish, chicken, turkey. Avoid red meat intake such as pork and/or steak What to Include . Vegetables o GREEN LEAFY VEGETABLES: Kale, spinach, mustard greens, collard greens, cabbage, broccoli, etc. o OTHER: Asparagus, cauliflower, eggplant, carrots, peas, Brussel sprouts, tomatoes, bell peppers, zucchini, beets, cucumbers, etc. . Grains, seeds, and legumes o Beans: kidney beans, black eyed peas, garbanzo beans, black beans, pinto beans, etc. o Whole, unrefined grains: brown rice, barley, bulgur, oatmeal, etc. . Healthy fats  o Avoid highly processed fats such as vegetable oil o Examples of healthy fats: avocado, olives, virgin olive oil, dark chocolate (?72% Cocoa), nuts (peanuts, almonds, walnuts, cashews, pecans, etc.) . None to Low  Intake of Animal Sources of Protein o Meat sources: chicken, turkey, salmon, tuna. Limit to 4 ounces of meat at one time. o Consider limiting dairy sources, but when choosing dairy focus on: PLAIN Greek yogurt, cottage cheese, high-protein milk . Fruit o Choose berries  When to Eat . Intermittent Fasting: o Choosing not to eat for a specific time period, but DO FOCUS ON HYDRATION when fasting o Multiple Techniques: - Time Restricted Eating: eat 3 meals in a day, each meal lasting no more than 60 minutes, no snacks between meals - 16-18 hour fast: fast for 16 to 18 hours up to 7 days a week. Often suggested to start with 2-3 nonconsecutive days per week.  . Remember the time you sleep is counted as fasting.  . Examples of eating schedule: Fast from 7:00pm-11:00am. Eat between 11:00am-7:00pm.  - 24-hour fast: fast for 24 hours up to every other day. Often suggested to start with 1 day per week . Remember the time you sleep is counted as fasting . Examples of eating schedule:  o Eating day: eat 2-3 meals on your eating day. If doing 2 meals, each meal should last no more than 90 minutes. If doing 3 meals, each meal should last no more than 60 minutes. Finish last meal by 7:00pm. o Fasting day: Fast until 7:00pm.  o IF YOU FEEL UNWELL FOR ANY REASON/IN ANY WAY WHEN FASTING, STOP FASTING BY EATING A NUTRITIOUS SNACK OR LIGHT MEAL o ALWAYS FOCUS ON HYDRATION DURING FASTS - Acceptable Hydration sources: water, broths, tea/coffee (black tea/coffee is best but using a small amount of whole-fat dairy products in coffee/tea is acceptable).  -   Poor Hydration Sources: anything with sugar or artificial sweeteners added to it  These recommendations have been developed for patients that are actively receiving medical care from either Dr. Hana Trippett or Sarah Gray, DNP, NP-C at [facility]. These recommendations are developed for patients with specific medical conditions and are not meant to be  distributed or used by others that are not actively receiving care from either provider listed above at [facility]. It is not appropriate to participate in the above eating plans without proper medical supervision.   Reference: Fung, J. The obesity code. [place]: [place]; 2016.   

## 2020-01-30 ENCOUNTER — Ambulatory Visit (INDEPENDENT_AMBULATORY_CARE_PROVIDER_SITE_OTHER): Payer: Medicare PPO | Admitting: Clinical

## 2020-01-30 ENCOUNTER — Other Ambulatory Visit: Payer: Self-pay

## 2020-01-30 DIAGNOSIS — F332 Major depressive disorder, recurrent severe without psychotic features: Secondary | ICD-10-CM

## 2020-01-30 NOTE — Progress Notes (Signed)
  Virtual Visit via Telephone Note  I connected Amanda Webster on 01/30/20 at  11:00 AM EDT by telephoneand verified that I am speaking with the correct person using two identifiers.  Location: Patient: Home Provider: Office   I discussed the limitations, risks, security and privacy concerns of performing an evaluation and management service by telephone and the availability of in person appointments. I also discussed with the patient that there may be a patient responsible charge related to this service. The patient expressed understanding and agreed to proceed.     .     THERAPIST PROGRESS NOTE  Session Time: 11:00AM-11:40AM  Participation Level: Active  Behavioral Response: CasualAlertDepressed  Type of Therapy: Individual Therapy  Treatment Goals addressed: Coping  Interventions: CBT, Motivational Interviewing and Strength-based  Summary: Amanda Webster is a 59 y.o. female who presents with Depression.The OPT therapist worked with thepatientfor herongoing OPT treatment. The OPT therapist utilized Motivational Interviewing to assist in creating therapeutic repore. The patient in the session was engaged and work in Science writer about hertriggers and symptoms over the past few weeksincluding herhealth struggles and her interaction with a significant other, as well as her caregiving for a elderly loved one. The OPT therapist utilized Cognitive Behavioral Therapy through cognitive restructuring as well as worked with the patient on coping strategies to assist in management ofDepression.   Suicidal/Homicidal: Nowithout intent/plan  Therapist Response: The OPT therapist worked with the patient for the patients scheduled session. The patient was engaged in hersession and gave feedback in relation to triggers, symptoms, and behavior responses over the pastfewweeks. The OPT therapist worked with the patient utilizing an in  session Cognitive Behavioral Therapy exercise. The patient was responsive in the session and verbalized, " I am being more active, talking myself into getting things done, I have been using my coping and getting out in the yard to do yardwork when the weather is permitting".The OPT therapist continued to promote the patient being active as well as involved with her support system (family members) and worked to continue to empower the patient. The OPT therapist will continue treatment work with the patient in hernext scheduled session.  Plan: Return again in 2/3 weeks.  Diagnosis:      Axis I:Major depressive disorder, recurrent severe without psychotic featuresMajor depressive disorder, recurrent severe without psychotic features                          Axis II: No diagnosis  I discussed the assessment and treatment plan with the patient. The patient was provided an opportunity to ask questions and all were answered. The patient agreed with the plan and demonstrated an understanding of the instructions.  The patient was advised to call back or seek an in-person evaluation if the symptoms worsen or if the condition fails to improve as anticipated.  I provided 40 minutes of non-face-to-face time during this encounter  Amanda Webster, Amanda Webster 01/30/2020

## 2020-01-31 ENCOUNTER — Ambulatory Visit: Payer: Medicare PPO | Admitting: Family Medicine

## 2020-02-05 ENCOUNTER — Ambulatory Visit: Payer: Medicare PPO | Admitting: Family Medicine

## 2020-02-20 ENCOUNTER — Ambulatory Visit (INDEPENDENT_AMBULATORY_CARE_PROVIDER_SITE_OTHER): Payer: Medicare PPO | Admitting: Clinical

## 2020-02-20 ENCOUNTER — Other Ambulatory Visit: Payer: Self-pay

## 2020-02-20 DIAGNOSIS — F332 Major depressive disorder, recurrent severe without psychotic features: Secondary | ICD-10-CM

## 2020-02-20 NOTE — Progress Notes (Signed)
Virtual Visit via Video Note  I connected with [name] on 02/20/20 at 11:00 AM EDT by a video enabled telemedicine application and verified that I am speaking with the correct person using two identifiers.  Location: Patient:Home Provider:Office  I discussed the limitations, risks, security and privacy concerns of performing an evaluation and management service by telephone and the availability of in person appointments. I also discussed with the patient that there may be a patient responsible charge related to this service. The patient expressed understanding and agreed to proceed.    THERAPIST PROGRESS NOTE  Session Time:11:00AM-11:40AM  Participation Level:Active  Behavioral Response:CasualAlertDepressed  Type of Therapy:Individual Therapy  Treatment Goals addressed:Coping  Interventions:CBT, Motivational Interviewing and Strength-based  Summary:Amanda Webster a 59 y.o.femalewho presents with Depression.The OPT therapist worked with thepatientfor herongoing OPT treatment. The OPT therapist utilized Motivational Interviewing to assist in creating therapeutic repore. The patient in the session was engaged and work in Science writer about hertriggers and symptoms over the past few weeksincluding ongoing health struggles effecting the patients mobility andher interaction with a significant other. The OPT therapist utilized Cognitive Behavioral Therapy through cognitive restructuring as well as worked with the patient on coping strategies to assist in management ofDepression.The OPT therapist worked with the patient to remain active and use scaling to assist in accomplishing larger tasks  Suicidal/Homicidal:Nowithout intent/plan  Therapist Response:The OPT therapist worked with the patient for the patients scheduled session. The patient was engaged in hersession and gave feedback in relation to triggers, symptoms, and  behavior responses over the pastfewweeks. The OPT therapist worked with the patient utilizing an in session Cognitive Behavioral Therapy exercise. The patient was responsive in the session and verbalized, " Iam proud of the work I am doing it helps me to stay active so much, I am going to work on scaling so I do not push past my limits".The OPT therapist continued to promote the patient being active as well as involved with her support system (family members) and worked to continue to empower the patient. The patient has had success in getting help from family members to complete at home tasks. The OPT therapist will continue treatment work with the patient in hernext scheduled session.  Plan: Return again in2/3weeks.  Diagnosis:Axis I:Major depressive disorder, recurrent severe without psychotic features  Axis II:No diagnosis  I discussed the assessment and treatment plan with the patient. The patient was provided an opportunity to ask questions and all were answered. The patient agreed with the plan and demonstrated an understanding of the instructions.  The patient was advised to call back or seek an in-person evaluation if the symptoms worsen or if the condition fails to improve as anticipated.  I provided37minutes of non-face-to-face time during this encounter  [name], Marlinda Mike 02/20/2020

## 2020-02-25 ENCOUNTER — Other Ambulatory Visit (INDEPENDENT_AMBULATORY_CARE_PROVIDER_SITE_OTHER): Payer: Self-pay

## 2020-02-25 MED ORDER — OZEMPIC (1 MG/DOSE) 2 MG/1.5ML ~~LOC~~ SOPN: 1.0000 mg | PEN_INJECTOR | SUBCUTANEOUS | 3 refills | Status: DC

## 2020-03-04 ENCOUNTER — Encounter (INDEPENDENT_AMBULATORY_CARE_PROVIDER_SITE_OTHER): Payer: Self-pay | Admitting: Internal Medicine

## 2020-03-04 ENCOUNTER — Ambulatory Visit (INDEPENDENT_AMBULATORY_CARE_PROVIDER_SITE_OTHER): Payer: Medicare PPO | Admitting: Internal Medicine

## 2020-03-04 ENCOUNTER — Other Ambulatory Visit: Payer: Self-pay

## 2020-03-04 VITALS — BP 128/80 | HR 76 | Temp 97.3°F | Resp 18 | Ht 62.0 in | Wt 194.4 lb

## 2020-03-04 DIAGNOSIS — I10 Essential (primary) hypertension: Secondary | ICD-10-CM

## 2020-03-04 DIAGNOSIS — E0865 Diabetes mellitus due to underlying condition with hyperglycemia: Secondary | ICD-10-CM

## 2020-03-04 DIAGNOSIS — Z794 Long term (current) use of insulin: Secondary | ICD-10-CM

## 2020-03-04 DIAGNOSIS — M79642 Pain in left hand: Secondary | ICD-10-CM | POA: Diagnosis not present

## 2020-03-04 DIAGNOSIS — E559 Vitamin D deficiency, unspecified: Secondary | ICD-10-CM

## 2020-03-04 DIAGNOSIS — E08618 Diabetes mellitus due to underlying condition with other diabetic arthropathy: Secondary | ICD-10-CM

## 2020-03-04 DIAGNOSIS — Z6835 Body mass index (BMI) 35.0-35.9, adult: Secondary | ICD-10-CM | POA: Diagnosis not present

## 2020-03-04 DIAGNOSIS — M79641 Pain in right hand: Secondary | ICD-10-CM

## 2020-03-04 DIAGNOSIS — IMO0002 Reserved for concepts with insufficient information to code with codable children: Secondary | ICD-10-CM

## 2020-03-04 DIAGNOSIS — E66812 Obesity, class 2: Secondary | ICD-10-CM

## 2020-03-04 NOTE — Progress Notes (Signed)
Metrics: Intervention Frequency ACO  Documented Smoking Status Yearly  Screened one or more times in 24 months  Cessation Counseling or  Active cessation medication Past 24 months  Past 24 months   Guideline developer: UpToDate (See UpToDate for funding source) Date Released: 2014       Wellness Office Visit  Subjective:  Patient ID: Amanda Webster, female    DOB: 1960/12/08  Age: 59 y.o. MRN: [id number]  CC: This lady comes in for follow-up regarding her blood work and further recommendations.  She is diabetic, obese. HPI  She discontinued glipizide and she tells me that her blood glucose levels sometimes are elevated above 300.  She is trying to do intermittent fasting but I am not sure what she is eating when she does eat. She continues to get hot flashes and night sweats but she does not think they are too bad.  She did not  want any further medications. Today she is also complaining of painful stiff hands bilaterally.  She has had this for several weeks now and wonders what is causing this. Past Medical History:  Diagnosis Date  . Allergy   . Anxiety   . Arnold-Chiari malformation ([place])   . Arthritis   . Borderline diabetic   . Chronic headaches   . Colon polyps    nonadenomatous  . Depression   . Diabetes mellitus without complication ([place])   . Diverticulosis   . Esophageal stricture   . GERD (gastroesophageal reflux disease)   . HTN (hypertension)   . Hyperlipidemia   . IBS (irritable bowel syndrome)   . Seizures ([place])    pt not sure when her last seizure was- she says she just spaces out- no shaking but she could not tell me last seizure-  on Oxtellar bid for seizures   . Sleep apnea    no cpap now- did use 02 with cpap but has not used either in "years"   Past Surgical History:  Procedure Laterality Date  . BREAST BIOPSY Left   . cahri decompression  6 and 03/1999  . CHOLECYSTECTOMY    . COLONOSCOPY    . invasive cervical traction  02/2009  . left  breast-lumpectomy    . POLYPECTOMY    . shunt put in on right side of brain    . SHUNT REPLACEMENT  12/10  . TOTAL ABDOMINAL HYSTERECTOMY  Age 73 yrs   TAH & BSO  . TUBAL LIGATION    . UPPER GASTROINTESTINAL ENDOSCOPY       Family History  Problem Relation Age of Onset  . Colon polyps Mother   . Breast cancer Mother   . Heart disease Father   . Stroke Father   . Colon cancer Neg Hx   . Esophageal cancer Neg Hx   . Rectal cancer Neg Hx   . Stomach cancer Neg Hx     Social History   Social History Narrative   Disabled from Chiari malformation/seizures.. Drinks 4-5 cups of tea daily. Does not get regular exercise. Married for 40 years.   Social History   Tobacco Use  . Smoking status: Former Smoker    Quit date: 09/13/1990    Years since quitting: 29.4  . Smokeless tobacco: Never Used  Substance Use Topics  . Alcohol use: No    Current Meds  Medication Sig  . ACCU-CHEK SOFTCLIX LANCETS lancets USE AS DIRECTED BID  . aspirin EC 81 MG tablet Take by mouth.  Marland Kitchen atorvastatin (LIPITOR) 20 MG  tablet TK 1 T PO QD  . cetirizine (ZYRTEC) 10 MG tablet TK 1 T PO QD  . fluticasone (FLONASE) 50 MCG/ACT nasal spray Place 1 spray into both nostrils daily.  Marland Kitchen gabapentin (NEURONTIN) 300 MG capsule Take 300 mg by mouth daily.  . methocarbamol (ROBAXIN) 500 MG tablet Take 500 mg by mouth every 8 (eight) hours as needed.  . naproxen sodium (ALEVE) 220 MG tablet Take 440 mg by mouth daily as needed (for pain).  . Oxcarbazepine (TRILEPTAL) 300 MG tablet Take 300 mg by mouth 3 (three) times daily.  Marland Kitchen OZEMPIC, 1 MG/DOSE, 2 MG/1.5ML SOPN Inject 0.75 mLs (1 mg total) into the skin once a week.  . propranolol (INDERAL) 60 MG tablet Take 1 tablet (60 mg total) by mouth 2 (two) times daily.       Depression screen [facility] 2/9 08/29/2019 08/21/2019 07/20/2016  Decreased Interest 0 0 0  Down, Depressed, Hopeless 0 0 0  PHQ - 2 Score 0 0 0     Objective:   Today's Vitals: BP 128/80 (BP  Location: Right Arm, Patient Position: Sitting, Cuff Size: Normal)   Pulse 76   Temp (!) 97.3 F (36.3 C) (Temporal)   Resp 18   Ht 5\' 2"  (1.575 m)   Wt 194 lb 6.4 oz (88.2 kg)   SpO2 98%   BMI 35.56 kg/m  Vitals with BMI 03/04/2020 01/28/2020 11/22/2019  Height 5\' 2"  5\' 2"  5\' 2"   Weight 194 lbs 6 oz 191 lbs 13 oz 195 lbs 3 oz  BMI 35.55 71.69 67.89  Systolic 381 017 510  Diastolic 80 75 72  Pulse 76 62 73     Physical Exam   She remains obese.  She has gained 3 pounds in weight since last visit.  Blood pressure is well controlled.    Assessment   1. Uncontrolled diabetes mellitus due to underlying condition with diabetic arthropathy, with long-term current use of insulin ([place])   2. Essential hypertension   3. Class 2 severe obesity due to excess calories with serious comorbidity and body mass index (BMI) of 35.0 to 35.9 in adult ([place])   4. Vitamin D deficiency disease   5. Pain in both hands       Tests ordered Orders Placed This Encounter  Procedures  . COMPLETE METABOLIC PANEL WITH GFR  . Lipid panel  . Hemoglobin A1c  . VITAMIN D 25 Hydroxy (Vit-D Deficiency, Fractures)  . T3, free  . T4  . TSH  . Ambulatory referral to Rheumatology     Plan: 1. Blood work is ordered above. 2. As far as her stiff and painful hands are concerned, I will refer to rheumatology. 3. I discussed the importance of a plant-based diet together with intermittent fasting that will actually help her diabetes.  I am not sure how much she will do of this. 4. Further recommendations will depend on blood results and she will follow-up with Sarah in 3 months.  In the meantime, she will continue with her medications for diabetes, hyperlipidemia and hypertension.   No orders of the defined types were placed in this encounter.   [name], MD

## 2020-03-04 NOTE — Patient Instructions (Signed)
[facility] Dietary Recommendations for Weight Loss What to Avoid . Avoid added sugars o Often added sugar can be found in processed foods such as many condiments, dry cereals, cakes, cookies, chips, crisps, crackers, candies, sweetened drinks, etc.  o Read labels and AVOID/DECREASE use of foods with the following in their ingredient list: Sugar, fructose, high fructose corn syrup, sucrose, glucose, maltose, dextrose, molasses, cane sugar, brown sugar, any type of syrup, agave nectar, etc.   . Avoid snacking in between meals . Avoid foods made with flour o If you are going to eat food made with flour, choose those made with whole-grains; and, minimize your consumption as much as is tolerable . Avoid processed foods o These foods are generally stocked in the middle of the grocery store. Focus on shopping on the perimeter of the grocery.  . Avoid Meat  o We recommend following a plant-based diet at [facility]. Thus, we recommend avoiding meat as a general rule. Consider eating beans, legumes, eggs, and/or dairy products for regular protein sources o If you plan on eating meat limit to 4 ounces of meat at a time and choose lean options such as Fish, chicken, turkey. Avoid red meat intake such as pork and/or steak What to Include . Vegetables o GREEN LEAFY VEGETABLES: Kale, spinach, mustard greens, collard greens, cabbage, broccoli, etc. o OTHER: Asparagus, cauliflower, eggplant, carrots, peas, Brussel sprouts, tomatoes, bell peppers, zucchini, beets, cucumbers, etc. . Grains, seeds, and legumes o Beans: kidney beans, black eyed peas, garbanzo beans, black beans, pinto beans, etc. o Whole, unrefined grains: brown rice, barley, bulgur, oatmeal, etc. . Healthy fats  o Avoid highly processed fats such as vegetable oil o Examples of healthy fats: avocado, olives, virgin olive oil, dark chocolate (?72% Cocoa), nuts (peanuts, almonds, walnuts, cashews, pecans, etc.) . None to Low  Intake of Animal Sources of Protein o Meat sources: chicken, turkey, salmon, tuna. Limit to 4 ounces of meat at one time. o Consider limiting dairy sources, but when choosing dairy focus on: PLAIN Greek yogurt, cottage cheese, high-protein milk . Fruit o Choose berries  When to Eat . Intermittent Fasting: o Choosing not to eat for a specific time period, but DO FOCUS ON HYDRATION when fasting o Multiple Techniques: - Time Restricted Eating: eat 3 meals in a day, each meal lasting no more than 60 minutes, no snacks between meals - 16-18 hour fast: fast for 16 to 18 hours up to 7 days a week. Often suggested to start with 2-3 nonconsecutive days per week.  . Remember the time you sleep is counted as fasting.  . Examples of eating schedule: Fast from 7:00pm-11:00am. Eat between 11:00am-7:00pm.  - 24-hour fast: fast for 24 hours up to every other day. Often suggested to start with 1 day per week . Remember the time you sleep is counted as fasting . Examples of eating schedule:  o Eating day: eat 2-3 meals on your eating day. If doing 2 meals, each meal should last no more than 90 minutes. If doing 3 meals, each meal should last no more than 60 minutes. Finish last meal by 7:00pm. o Fasting day: Fast until 7:00pm.  o IF YOU FEEL UNWELL FOR ANY REASON/IN ANY WAY WHEN FASTING, STOP FASTING BY EATING A NUTRITIOUS SNACK OR LIGHT MEAL o ALWAYS FOCUS ON HYDRATION DURING FASTS - Acceptable Hydration sources: water, broths, tea/coffee (black tea/coffee is best but using a small amount of whole-fat dairy products in coffee/tea is acceptable).  -   Poor Hydration Sources: anything with sugar or artificial sweeteners added to it  These recommendations have been developed for patients that are actively receiving medical care from either Dr. Chaney Ingram or Sarah Gray, DNP, NP-C at [facility]. These recommendations are developed for patients with specific medical conditions and are not meant to be  distributed or used by others that are not actively receiving care from either provider listed above at [facility]. It is not appropriate to participate in the above eating plans without proper medical supervision.   Reference: Fung, J. The obesity code. [place]: [place]; 2016.   

## 2020-03-05 ENCOUNTER — Ambulatory Visit (INDEPENDENT_AMBULATORY_CARE_PROVIDER_SITE_OTHER): Payer: Medicare PPO | Admitting: Clinical

## 2020-03-05 DIAGNOSIS — F332 Major depressive disorder, recurrent severe without psychotic features: Secondary | ICD-10-CM | POA: Diagnosis not present

## 2020-03-05 LAB — LIPID PANEL
Cholesterol: 172 mg/dL (ref <200)
HDL: 43 mg/dL — ABNORMAL LOW (ref >=50)
LDL Cholesterol (Calc): 96 mg/dL (calc)
Non-HDL Cholesterol (Calc): 129 mg/dL (calc) (ref <130)
Total CHOL/HDL Ratio: 4 (calc) (ref <5.0)
Triglycerides: 250 mg/dL — ABNORMAL HIGH (ref <150)

## 2020-03-05 LAB — COMPLETE METABOLIC PANEL WITH GFR
AG Ratio: 1.8 (calc) (ref 1.0–2.5)
ALT: 19 U/L (ref 6–29)
AST: 18 U/L (ref 10–35)
Albumin: 4.4 g/dL (ref 3.6–5.1)
Alkaline phosphatase (APISO): 120 U/L (ref 37–153)
BUN: 10 mg/dL (ref 7–25)
CO2: 27 mmol/L (ref 20–32)
Calcium: 9.8 mg/dL (ref 8.6–10.4)
Chloride: 104 mmol/L (ref 98–110)
Creat: 0.82 mg/dL (ref 0.50–1.05)
GFR, Est African American: 92 mL/min/{1.73_m2} (ref >=60)
GFR, Est Non African American: 79 mL/min/{1.73_m2} (ref >=60)
Globulin: 2.4 g/dL (calc) (ref 1.9–3.7)
Glucose, Bld: 180 mg/dL — ABNORMAL HIGH (ref 65–99)
Potassium: 3.9 mmol/L (ref 3.5–5.3)
Sodium: 141 mmol/L (ref 135–146)
Total Bilirubin: 0.3 mg/dL (ref 0.2–1.2)
Total Protein: 6.8 g/dL (ref 6.1–8.1)

## 2020-03-05 LAB — TSH: TSH: 0.49 mIU/L (ref 0.40–4.50)

## 2020-03-05 LAB — HEMOGLOBIN A1C
Hgb A1c MFr Bld: 6.8 % of total Hgb — ABNORMAL HIGH (ref <5.7)
Mean Plasma Glucose: 148 (calc)
eAG (mmol/L): 8.2 (calc)

## 2020-03-05 LAB — VITAMIN D 25 HYDROXY (VIT D DEFICIENCY, FRACTURES): Vit D, 25-Hydroxy: 41 ng/mL (ref 30–100)

## 2020-03-05 LAB — T3, FREE: T3, Free: 3.1 pg/mL (ref 2.3–4.2)

## 2020-03-05 LAB — T4: T4, Total: 8.7 ug/dL (ref 5.1–11.9)

## 2020-03-05 NOTE — Progress Notes (Signed)
   Virtual Visit via Video Note  I connected Annada on 03/05/20 at 1:00 PM EDT by a video enabled telemedicine application and verified that I am speaking with the correct person using two identifiers.  Location: Patient:Home Provider:Office  I discussed the limitations, risks, security and privacy concerns of performing an evaluation and management service by telephone and the availability of in person appointments. I also discussed with the patient that there may be a patient responsible charge related to this service. The patient expressed understanding and agreed to proceed.    THERAPIST PROGRESS NOTE  Session Time:1:00PM-1:40PM  Participation Level:Active  Behavioral Response:CasualAlertDepressed  Type of Therapy:Individual Therapy  Treatment Goals addressed:Coping  Interventions:CBT, Motivational Interviewing and Strength-based  Summary:Minami S Harbouris a 59 y.o.femalewho presents with Depression.The OPT therapist worked with thepatientfor herongoing OPT treatment. The OPT therapist utilized Motivational Interviewing to assist in creating therapeutic repore. The patient in the session was engaged and work in Science writer about hertriggers and symptoms over the past few weeksincluding ongoing health struggles effecting the patients mobility, interaction with a significant other, and mood management. The OPT therapist utilized Cognitive Behavioral Therapy through cognitive restructuring as well as worked with the patient on coping strategies to assist in management ofDepression including continuing to be active, working on communication, and scaling tasks.The OPT therapist worked with the patient to remain active and use scaling to assist in accomplishing larger tasks  Suicidal/Homicidal:Nowithout intent/plan  Therapist Response:The OPT therapist worked with the patient for the patients scheduled session. The  patient was engaged in hersession and gave feedback in relation to triggers, symptoms, and behavior responses over the pastfewweeks. The OPT therapist worked with the patient utilizing an in session Cognitive Behavioral Therapy exercise. The patient was responsive in the session and verbalized, " Iam going to try to work on communicating without doing it while I am mad".The OPT therapist continued to promote the patient being active as well as involved with her support system (family members) and worked to continue to empower the patient. The patient worked with the [place] therapist on setting boundaries with her loved ones.The OPT therapist will continue treatment work with the patient in hernext scheduled session.  Plan: Return again in2/3weeks.  Diagnosis:Axis I:Major depressive disorder, recurrent severe without psychotic features  Axis II:No diagnosis  I discussed the assessment and treatment plan with the patient. The patient was provided an opportunity to ask questions and all were answered. The patient agreed with the plan and demonstrated an understanding of the instructions.  The patient was advised to call back or seek an in-person evaluation if the symptoms worsen or if the condition fails to improve as anticipated.  I provided15minutes of non-face-to-face time during this encounter  [name], Marlinda Mike 03/05/2020

## 2020-03-14 ENCOUNTER — Encounter (INDEPENDENT_AMBULATORY_CARE_PROVIDER_SITE_OTHER): Payer: Self-pay | Admitting: Internal Medicine

## 2020-03-15 ENCOUNTER — Other Ambulatory Visit (INDEPENDENT_AMBULATORY_CARE_PROVIDER_SITE_OTHER): Payer: Self-pay | Admitting: Internal Medicine

## 2020-03-15 ENCOUNTER — Other Ambulatory Visit: Payer: Self-pay | Admitting: Family Medicine

## 2020-03-25 ENCOUNTER — Other Ambulatory Visit: Payer: Self-pay

## 2020-03-25 ENCOUNTER — Ambulatory Visit (INDEPENDENT_AMBULATORY_CARE_PROVIDER_SITE_OTHER): Payer: Medicare PPO | Admitting: Clinical

## 2020-03-25 DIAGNOSIS — F332 Major depressive disorder, recurrent severe without psychotic features: Secondary | ICD-10-CM | POA: Diagnosis not present

## 2020-03-25 NOTE — Progress Notes (Signed)
  Virtual Visit via Video Note  I connected withDarlene S Harbouron 07/13/21at 1:00 PM EDTby a video enabled telemedicine application and verified that I am speaking with the correct person using two identifiers.  Location: Patient:Home Provider:Office  I discussed the limitations, risks, security and privacy concerns of performing an evaluation and management service by telephone and the availability of in person appointments. I also discussed with the patient that there may be a patient responsible charge related to this service. The patient expressed understanding and agreed to proceed.    THERAPIST PROGRESS NOTE  Session Time:1:00PM-1:45PM  Participation Level:Active  Behavioral Response:CasualAlertDepressed  Type of Therapy:Individual Therapy  Treatment Goals addressed:Coping  Interventions:CBT, Motivational Interviewing and Strength-based  Summary:Amanda S Harbouris a 59 y.o.femalewho presents with Depression.The OPT therapist worked with thepatientfor herongoing OPT treatment. The OPT therapist utilized Motivational Interviewing to assist in creating therapeutic repore. The patient in the session was engaged and work in Science writer about hertriggers and symptoms over the past few weeksincludingongoinghealth struggles, interaction with support network/ family and interaction with a her significant other. The OPT therapist utilized Cognitive Behavioral Therapy through cognitive restructuring as well as worked with the patient on coping strategies to assist in management ofDepression including continuing to be active,  Implementing coping,working on communication, and scaling tasks.The OPT therapist worked with the patient preparing for upcoming Discharge.  Suicidal/Homicidal:Nowithout intent/plan  Therapist Response:The OPT therapist worked with the patient for the patients scheduled session. The patient was engaged in  hersession and gave feedback in relation to triggers, symptoms, and behavior responses over the pastfewweeks. The OPT therapist worked with the patient utilizing an in session Cognitive Behavioral Therapy exercise. The patient was responsive in the session and verbalized, " Iknow using my distractions (coping skills) are really helping me".The patient spoke about a recent out of state road trip to visit family, ongoing work around her home, and being physically active doing walking trails.The OPT therapist continued to promote the patient being active as well as involved with her support system (family members) and worked to continue to empower the patient.The OPT therapist will continue treatment work with the patient in hernext scheduled session which will be her final scheduled session.  Plan: Return again in2/3weeks.  Diagnosis:Axis I:Major depressive disorder, recurrent severe without psychotic features  Axis II:No diagnosis  I discussed the assessment and treatment plan with the patient. The patient was provided an opportunity to ask questions and all were answered. The patient agreed with the plan and demonstrated an understanding of the instructions.  The patient was advised to call back or seek an in-person evaluation if the symptoms worsen or if the condition fails to improve as anticipated.  I provided69minutes of non-face-to-face time during this encounter  [name], [facility] 03/25/2020

## 2020-04-02 DIAGNOSIS — J301 Allergic rhinitis due to pollen: Secondary | ICD-10-CM | POA: Diagnosis not present

## 2020-04-02 DIAGNOSIS — M26642 Arthritis of left temporomandibular joint: Secondary | ICD-10-CM | POA: Diagnosis not present

## 2020-04-02 DIAGNOSIS — H9313 Tinnitus, bilateral: Secondary | ICD-10-CM | POA: Diagnosis not present

## 2020-04-02 DIAGNOSIS — H6983 Other specified disorders of Eustachian tube, bilateral: Secondary | ICD-10-CM | POA: Diagnosis not present

## 2020-04-02 DIAGNOSIS — H903 Sensorineural hearing loss, bilateral: Secondary | ICD-10-CM | POA: Diagnosis not present

## 2020-04-07 ENCOUNTER — Other Ambulatory Visit: Payer: Self-pay

## 2020-04-07 ENCOUNTER — Ambulatory Visit (INDEPENDENT_AMBULATORY_CARE_PROVIDER_SITE_OTHER): Payer: Medicare PPO | Admitting: Clinical

## 2020-04-07 DIAGNOSIS — F332 Major depressive disorder, recurrent severe without psychotic features: Secondary | ICD-10-CM

## 2020-04-07 NOTE — Progress Notes (Signed)
  Virtual Visit via Video Note  I connected withDarlene S Harbouron 07/26/21at 1:00PM EDTby a video enabled telemedicine application and verified that I am speaking with the correct person using two identifiers.  Location: Patient:Home Provider:Office  I discussed the limitations, risks, security and privacy concerns of performing an evaluation and management service by telephone and the availability of in person appointments. I also discussed with the patient that there may be a patient responsible charge related to this service. The patient expressed understanding and agreed to proceed.    THERAPIST PROGRESS NOTE  Session Time:1:00PM-1:45PM  Participation Level:Active  Behavioral Response:CasualAlertDepressed  Type of Therapy:Individual Therapy  Treatment Goals addressed:Coping  Interventions:CBT, Motivational Interviewing and Strength-based  Summary:Amanda S Harbouris a 59 y.o.femalewho presents with Depression.The OPT therapist worked with thepatientfor herfinal OPT treatment. The OPT therapist utilized Motivational Interviewing to assist in creating therapeutic repore. The patient in the session was engaged and work in Science writer about hertriggers and symptoms over the past few weeksincludingongoing implementation of coping to manage pain. The OPT therapist utilized Cognitive Behavioral Therapy through cognitive restructuring as well as worked with the patient on coping strategies to assist in management ofher mental health symptoms and the patient has been active in implementing coping between her sessions.The OPT therapist reviewed Discharge with the Pt in agreement that she has met her plan goal and is ready to move forward without the supportive therapy.  Suicidal/Homicidal:Nowithout intent/plan  Therapist Response:The OPT therapist worked with the patient for the patients final scheduled session. The patient was  engaged in hersession and gave feedback in relation to triggers, symptoms, and behavior responses over the pastfewweeks. The OPT therapist worked with the patient utilizing an in session Cognitive Behavioral Therapy exercise. The patient was responsive in the session and acknowledged her work in implementing coping skills/ distractions as well as utilizing positive thinking.The OPT therapist reviewed with the patient in her final session her progress throughout the program and her option in moving forward should she need the service in the future to re-engage in treatment.  Plan: Successful Discharge.  Diagnosis:Axis I:Major depressive disorder, recurrent severe without psychotic features  Axis II:No diagnosis  I discussed the assessment and treatment plan with the patient. The patient was provided an opportunity to ask questions and all were answered. The patient agreed with the plan and demonstrated an understanding of the instructions.  The patient was advised to call back or seek an in-person evaluation if the symptoms worsen or if the condition fails to improve as anticipated.  I provided69mnutes of non-face-to-face time during this encounter  TLennox Grumbles LMarlinda Mike7/26/2021

## 2020-04-11 ENCOUNTER — Other Ambulatory Visit: Payer: Self-pay | Admitting: Family Medicine

## 2020-04-21 DIAGNOSIS — G4701 Insomnia due to medical condition: Secondary | ICD-10-CM | POA: Diagnosis not present

## 2020-04-21 DIAGNOSIS — G43711 Chronic migraine without aura, intractable, with status migrainosus: Secondary | ICD-10-CM | POA: Diagnosis not present

## 2020-04-21 DIAGNOSIS — I10 Essential (primary) hypertension: Secondary | ICD-10-CM | POA: Diagnosis not present

## 2020-04-21 DIAGNOSIS — G4700 Insomnia, unspecified: Secondary | ICD-10-CM | POA: Insufficient documentation

## 2020-04-21 DIAGNOSIS — E1343 Other specified diabetes mellitus with diabetic autonomic (poly)neuropathy: Secondary | ICD-10-CM | POA: Diagnosis not present

## 2020-04-21 DIAGNOSIS — G44221 Chronic tension-type headache, intractable: Secondary | ICD-10-CM | POA: Diagnosis not present

## 2020-05-11 ENCOUNTER — Other Ambulatory Visit: Payer: Self-pay | Admitting: Family Medicine

## 2020-05-22 ENCOUNTER — Encounter (INDEPENDENT_AMBULATORY_CARE_PROVIDER_SITE_OTHER): Payer: Self-pay | Admitting: Internal Medicine

## 2020-05-22 ENCOUNTER — Ambulatory Visit (INDEPENDENT_AMBULATORY_CARE_PROVIDER_SITE_OTHER): Payer: Medicare PPO | Admitting: Internal Medicine

## 2020-05-22 ENCOUNTER — Other Ambulatory Visit: Payer: Self-pay

## 2020-05-22 VITALS — BP 140/80 | HR 62 | Temp 97.7°F | Ht 62.0 in | Wt 190.8 lb

## 2020-05-22 DIAGNOSIS — R300 Dysuria: Secondary | ICD-10-CM

## 2020-05-22 DIAGNOSIS — N390 Urinary tract infection, site not specified: Secondary | ICD-10-CM | POA: Diagnosis not present

## 2020-05-22 MED ORDER — CIPROFLOXACIN HCL 500 MG PO TABS: 500.0000 mg | ORAL_TABLET | Freq: Two times a day (BID) | ORAL | 0 refills | Status: DC

## 2020-05-22 NOTE — Progress Notes (Signed)
Metrics: Intervention Frequency ACO  Documented Smoking Status Yearly  Screened one or more times in 24 months  Cessation Counseling or  Active cessation medication Past 24 months  Past 24 months   Guideline developer: UpToDate (See UpToDate for funding source) Date Released: 2014       Wellness Office Visit  Subjective:  Patient ID: [facility], female    DOB: 09-15-1960  Age: 59 y.o. MRN: [id number]  CC: Dysuria, increased frequency of micturition. HPI  This lady comes in his acute visit with few days history of dysuria and increased frequency of micturition.  She describes nausea but this is something that she has had in the background.  She denies any vomiting.  There is no fever.  There is no significant abdominal pain although she describes some nonspecific back pain.  She is diabetic. Past Medical History:  Diagnosis Date  . Allergy   . Anxiety   . Arnold-Chiari malformation ([place])   . Arthritis   . Borderline diabetic   . Chronic headaches   . Colon polyps    nonadenomatous  . Depression   . Diabetes mellitus without complication ([place])   . Diverticulosis   . Esophageal stricture   . GERD (gastroesophageal reflux disease)   . HTN (hypertension)   . Hyperlipidemia   . IBS (irritable bowel syndrome)   . Seizures ([place])    pt not sure when her last seizure was- she says she just spaces out- no shaking but she could not tell me last seizure-  on [place] bid for seizures   . Sleep apnea    no cpap now- did use 02 with cpap but has not used either in "years"   Past Surgical History:  Procedure Laterality Date  . BREAST BIOPSY Left   . cahri decompression  8 and 03/1999  . CHOLECYSTECTOMY    . COLONOSCOPY    . invasive cervical traction  02/2009  . left breast-lumpectomy    . POLYPECTOMY    . shunt put in on right side of brain    . SHUNT REPLACEMENT  12/10  . TOTAL ABDOMINAL HYSTERECTOMY  Age 7 yrs   TAH & BSO  . TUBAL LIGATION    . UPPER GASTROINTESTINAL  ENDOSCOPY       Family History  Problem Relation Age of Onset  . Colon polyps Mother   . Breast cancer Mother   . Heart disease Father   . Stroke Father   . Colon cancer Neg Hx   . Esophageal cancer Neg Hx   . Rectal cancer Neg Hx   . Stomach cancer Neg Hx     Social History   Social History Narrative   Disabled from Chiari malformation/seizures.. Drinks 4-5 cups of tea daily. Does not get regular exercise. Married for 40 years.   Social History   Tobacco Use  . Smoking status: Former Smoker    Quit date: 09/13/1990    Years since quitting: 29.7  . Smokeless tobacco: Never Used  Substance Use Topics  . Alcohol use: No    Current Meds  Medication Sig  . ACCU-CHEK SOFTCLIX LANCETS lancets USE AS DIRECTED BID  . aspirin EC 81 MG tablet Take by mouth.  Marland Kitchen atorvastatin (LIPITOR) 20 MG tablet TK 1 T PO QD  . cetirizine (ZYRTEC) 10 MG tablet TK 1 T PO QD  . fluticasone (FLONASE) 50 MCG/ACT nasal spray Place 1 spray into both nostrils daily.  Marland Kitchen gabapentin (NEURONTIN) 300 MG capsule TAKE 1  CAPSULE BY MOUTH AT BEDTIME  . methocarbamol (ROBAXIN) 500 MG tablet Take 500 mg by mouth every 8 (eight) hours as needed.  . naproxen sodium (ALEVE) 220 MG tablet Take 440 mg by mouth daily as needed (for pain).  . Oxcarbazepine (TRILEPTAL) 300 MG tablet Take 300 mg by mouth 3 (three) times daily.  . propranolol (INDERAL) 60 MG tablet Take 1 tablet (60 mg total) by mouth 2 (two) times daily.      Depression screen [facility] 2/9 08/29/2019 08/21/2019 07/20/2016  Decreased Interest 0 0 0  Down, Depressed, Hopeless 0 0 0  PHQ - 2 Score 0 0 0     Objective:   Today's Vitals: BP 140/80 (BP Location: Left Arm, Patient Position: Sitting, Cuff Size: Normal)   Pulse 62   Temp 97.7 F (36.5 C) (Temporal)   Ht 5\' 2"  (1.575 m)   Wt 190 lb 12.8 oz (86.5 kg)   SpO2 94%   BMI 34.90 kg/m  Vitals with BMI 05/22/2020 03/04/2020 01/28/2020  Height 5\' 2"  5\' 2"  5\' 2"   Weight 190 lbs 13 oz 194 lbs 6 oz 191  lbs 13 oz  BMI 34.89 70.01 74.94  Systolic 496 759 163  Diastolic 80 80 75  Pulse 62 76 62     Physical Exam  She looks systemically well and is afebrile.     Assessment   1. Dysuria   2. Urinary tract infection without hematuria, site unspecified       Tests ordered Orders Placed This Encounter  Procedures  . Urinalysis w microscopic + reflex cultur     Plan: 1. She likely has a urinary tract infection.  We will send the urine for urinalysis and possible culture.  In the meantime, I will empirically treat her with ciprofloxacin. 2. Also in view of the COVID-19 pandemic, I did discuss with her again regarding COVID-19 vaccination.  I have recommended to her that she get COVID-19 vaccine as soon as possible.  She is very resistant to COVID-19 vaccination and I have told her that this is AGAINST MEDICAL ADVICE.   Meds ordered this encounter  Medications  . ciprofloxacin (CIPRO) 500 MG tablet    Sig: Take 1 tablet (500 mg total) by mouth 2 (two) times daily.    Dispense:  6 tablet    Refill:  0    Amire Leazer Luther Parody, MD

## 2020-05-23 LAB — NO CULTURE INDICATED

## 2020-05-23 LAB — URINALYSIS W MICROSCOPIC + REFLEX CULTURE
Bacteria, UA: NONE SEEN /HPF
Bilirubin Urine: NEGATIVE
Glucose, UA: NEGATIVE
Hgb urine dipstick: NEGATIVE
Hyaline Cast: NONE SEEN /LPF
Ketones, ur: NEGATIVE
Leukocyte Esterase: NEGATIVE
Nitrites, Initial: NEGATIVE
Protein, ur: NEGATIVE
RBC / HPF: NONE SEEN /HPF (ref 0–2)
Specific Gravity, Urine: 1.008 (ref 1.001–1.03)
Squamous Epithelial / HPF: NONE SEEN /HPF (ref <=5)
WBC, UA: NONE SEEN /HPF (ref 0–5)
pH: 6.5 (ref 5.0–8.0)

## 2020-05-27 ENCOUNTER — Encounter (INDEPENDENT_AMBULATORY_CARE_PROVIDER_SITE_OTHER): Payer: Self-pay | Admitting: Internal Medicine

## 2020-05-28 ENCOUNTER — Other Ambulatory Visit (INDEPENDENT_AMBULATORY_CARE_PROVIDER_SITE_OTHER): Payer: Self-pay | Admitting: Internal Medicine

## 2020-05-28 ENCOUNTER — Other Ambulatory Visit: Payer: Self-pay | Admitting: Radiology

## 2020-05-28 ENCOUNTER — Other Ambulatory Visit: Payer: Medicare PPO

## 2020-05-28 DIAGNOSIS — Z20822 Contact with and (suspected) exposure to covid-19: Secondary | ICD-10-CM | POA: Diagnosis not present

## 2020-05-28 MED ORDER — CETIRIZINE HCL 10 MG PO TABS
ORAL_TABLET | ORAL | 5 refills | Status: DC
Start: 2020-05-28 — End: 2020-12-15

## 2020-05-28 MED ORDER — PROPRANOLOL HCL 60 MG PO TABS
60.0000 mg | ORAL_TABLET | Freq: Two times a day (BID) | ORAL | 1 refills | Status: DC
Start: 2020-05-28 — End: 2020-09-23

## 2020-05-29 LAB — SARS-COV-2, NAA 2 DAY TAT

## 2020-05-29 LAB — NOVEL CORONAVIRUS, NAA: SARS-CoV-2, NAA: NOT DETECTED

## 2020-06-11 ENCOUNTER — Ambulatory Visit (INDEPENDENT_AMBULATORY_CARE_PROVIDER_SITE_OTHER): Payer: Medicare PPO | Admitting: Nurse Practitioner

## 2020-06-12 ENCOUNTER — Ambulatory Visit (INDEPENDENT_AMBULATORY_CARE_PROVIDER_SITE_OTHER): Payer: Medicare PPO | Admitting: Nurse Practitioner

## 2020-06-12 ENCOUNTER — Ambulatory Visit (INDEPENDENT_AMBULATORY_CARE_PROVIDER_SITE_OTHER): Payer: Medicare PPO | Admitting: Clinical

## 2020-06-12 ENCOUNTER — Other Ambulatory Visit: Payer: Self-pay

## 2020-06-12 ENCOUNTER — Encounter (INDEPENDENT_AMBULATORY_CARE_PROVIDER_SITE_OTHER): Payer: Self-pay | Admitting: Nurse Practitioner

## 2020-06-12 VITALS — BP 142/84 | HR 77 | Temp 98.0°F | Resp 12 | Ht 62.0 in | Wt 188.4 lb

## 2020-06-12 DIAGNOSIS — S0990XA Unspecified injury of head, initial encounter: Secondary | ICD-10-CM

## 2020-06-12 DIAGNOSIS — E0865 Diabetes mellitus due to underlying condition with hyperglycemia: Secondary | ICD-10-CM

## 2020-06-12 DIAGNOSIS — H9212 Otorrhea, left ear: Secondary | ICD-10-CM

## 2020-06-12 DIAGNOSIS — Z1159 Encounter for screening for other viral diseases: Secondary | ICD-10-CM

## 2020-06-12 DIAGNOSIS — E08618 Diabetes mellitus due to underlying condition with other diabetic arthropathy: Secondary | ICD-10-CM | POA: Diagnosis not present

## 2020-06-12 DIAGNOSIS — E559 Vitamin D deficiency, unspecified: Secondary | ICD-10-CM

## 2020-06-12 DIAGNOSIS — F332 Major depressive disorder, recurrent severe without psychotic features: Secondary | ICD-10-CM | POA: Diagnosis not present

## 2020-06-12 DIAGNOSIS — Z794 Long term (current) use of insulin: Secondary | ICD-10-CM

## 2020-06-12 DIAGNOSIS — IMO0002 Reserved for concepts with insufficient information to code with codable children: Secondary | ICD-10-CM

## 2020-06-12 DIAGNOSIS — Z124 Encounter for screening for malignant neoplasm of cervix: Secondary | ICD-10-CM | POA: Diagnosis not present

## 2020-06-12 DIAGNOSIS — I10 Essential (primary) hypertension: Secondary | ICD-10-CM | POA: Diagnosis not present

## 2020-06-12 NOTE — Progress Notes (Signed)
Subjective:  Patient ID: Amanda Webster, female    DOB: May 30, 1961  Age: 59 y.o. MRN: [id number]  CC:  Chief Complaint  Patient presents with  . Other    Ear drainage, head injury, vitamin D deficiency  . Diabetes      HPI  This patient arrives today for the above.  She tells me she is been experiencing some bloody ear drainage for the past couple of days.  She does use Q-tips.  She denies any significant pain or changes to her hearing.  She does tell me she was doing yard work the other day and a branch hit her in the head.  This occurred approximately 4 days ago.  She has had a bit of a headache with some mild dizziness since then.  She denies any nausea or vomiting, sensation changes, or weakness.  She has a history of vitamin D deficiency and continues on her supplement.  She is due to have vitamin D level checked today.  She has had a history of type 2 diabetes and last A1c was 6.8.  She continues on statin therapy.  She is due for flu shot, pneumonia shot, COVID-19 vaccinations, to be check 3 albuminuria.  She would not like to have any vaccines administered today but is agreeable to having her albumin checked.  She also has not been screened for hepatitis C and is agreeable to having this done today as well.  Past Medical History:  Diagnosis Date  . Allergy   . Anxiety   . Arnold-Chiari malformation ([place])   . Arthritis   . Borderline diabetic   . Chronic headaches   . Colon polyps    nonadenomatous  . Depression   . Diabetes mellitus without complication ([place])   . Diverticulosis   . Esophageal stricture   . GERD (gastroesophageal reflux disease)   . HTN (hypertension)   . Hyperlipidemia   . IBS (irritable bowel syndrome)   . Seizures ([place])    pt not sure when her last seizure was- she says she just spaces out- no shaking but she could not tell me last seizure-  on [place] bid for seizures   . Sleep apnea    no cpap now- did use 02 with cpap but has not used  either in "years"      Family History  Problem Relation Age of Onset  . Colon polyps Mother   . Breast cancer Mother   . Heart disease Father   . Stroke Father   . Colon cancer Neg Hx   . Esophageal cancer Neg Hx   . Rectal cancer Neg Hx   . Stomach cancer Neg Hx     Social History   Social History Narrative   Disabled from Chiari malformation/seizures.. Drinks 4-5 cups of tea daily. Does not get regular exercise. Married for 40 years.   Social History   Tobacco Use  . Smoking status: Former Smoker    Quit date: 09/13/1990    Years since quitting: 29.7  . Smokeless tobacco: Never Used  Substance Use Topics  . Alcohol use: No     Current Meds  Medication Sig  . ACCU-CHEK SOFTCLIX LANCETS lancets USE AS DIRECTED BID  . amLODipine (NORVASC) 5 MG tablet Take 5 mg by mouth daily.  Marland Kitchen aspirin EC 81 MG tablet Take by mouth.  Marland Kitchen atorvastatin (LIPITOR) 20 MG tablet TK 1 T PO QD  . cetirizine (ZYRTEC) 10 MG tablet TK 1 T PO  QD  . Cholecalciferol 1.25 MG (50000 UT) TABS Take 1 tablet by mouth.  . fluticasone (FLONASE) 50 MCG/ACT nasal spray Place 1 spray into both nostrils daily.  Marland Kitchen gabapentin (NEURONTIN) 300 MG capsule TAKE 1 CAPSULE BY MOUTH AT BEDTIME  . methocarbamol (ROBAXIN) 500 MG tablet Take 500 mg by mouth every 8 (eight) hours as needed.  . naproxen sodium (ALEVE) 220 MG tablet Take 440 mg by mouth daily as needed (for pain).  . Oxcarbazepine (TRILEPTAL) 300 MG tablet Take 150 mg by mouth 3 (three) times daily.   . propranolol (INDERAL) 60 MG tablet Take 1 tablet (60 mg total) by mouth 2 (two) times daily.  . Semaglutide (OZEMPIC, 1 MG/DOSE, [place]) Inject 1 mg into the skin once a week.    ROS:  Review of Systems  Constitutional: Positive for malaise/fatigue.  Eyes: Positive for blurred vision.  Respiratory: Negative.   Cardiovascular: Negative.   Gastrointestinal: Positive for nausea (chronic and stable).  Neurological: Positive for headaches. Negative for  dizziness.     Objective:   Today's Vitals: BP (!) 142/84   Pulse 77   Temp 98 F (36.7 C)   Resp 12   Ht 5' 2"  (1.575 m)   Wt 188 lb 6.4 oz (85.5 kg)   SpO2 93%   BMI 34.46 kg/m  Vitals with BMI 06/12/2020 05/22/2020 03/04/2020  Height 5' 2"  5' 2"  5' 2"   Weight 188 lbs 6 oz 190 lbs 13 oz 194 lbs 6 oz  BMI 34.45 16.10 96.04  Systolic 540 981 191  Diastolic 84 80 80  Pulse 77 62 76     Physical Exam Vitals reviewed.  Constitutional:      General: She is not in acute distress.    Appearance: Normal appearance.  HENT:     Head: Normocephalic and atraumatic.     Right Ear: Tympanic membrane and ear canal normal. Tympanic membrane is not injected, scarred, perforated, erythematous, retracted or bulging.     Left Ear: Tympanic membrane normal. Laceration and drainage present. No swelling or tenderness. Tympanic membrane is not injected, scarred, perforated, erythematous, retracted or bulging.  Neck:     Vascular: No carotid bruit.  Cardiovascular:     Rate and Rhythm: Normal rate and regular rhythm.     Pulses: Normal pulses.     Heart sounds: Normal heart sounds.  Pulmonary:     Effort: Pulmonary effort is normal.     Breath sounds: Normal breath sounds.  Skin:    General: Skin is warm and dry.  Neurological:     General: No focal deficit present.     Mental Status: She is alert and oriented to person, place, and time.     GCS: GCS eye subscore is 4. GCS verbal subscore is 5. GCS motor subscore is 6.     Sensory: Sensation is intact.     Motor: Motor function is intact.     Coordination: Coordination is intact.  Psychiatric:        Mood and Affect: Mood normal.        Behavior: Behavior normal.        Judgment: Judgment normal.          Assessment and Plan   1. Ear drainage, left   2. Essential hypertension   3. Vitamin D deficiency disease   4. Uncontrolled diabetes mellitus due to underlying condition with diabetic arthropathy, with long-term current use  of insulin (HCC)   5. Cervical cancer screening  6. Encounter for hepatitis C screening test for low risk patient   7. Injury of head, initial encounter      Plan: 1.  It does approach is a scab to the canal of her left ear.  Tympanic membrane is clear and no fluid noted behind it.  I recommended she monitor her ear for the next 7 to 10 days and avoid using Q-tips.  If pain persists or worsens by next week she should let us know we can consider antibiotic therapy. 2.  She will continue on her current medications as prescribed. 3.  We will check serum vitamin D level and she continue on her supplement. 4.  We will check A1c and chest urine for albuminuria today.  She will continue on her current medications for that. 5., 6.  She did mention that she has had a hysterectomy but believes she is supposed to continue with Pap smears.  We will refer her to OB/GYN to confirm this and if she needs it to then perform Pap smear.  We will screen for hepatitis C today.  Will not administer vaccinations per her request. 7.  I wonder if she may have experienced a mild concussion.  At this point its been 4 days since episode I did give her precautions of when to proceed to the emergency department especially if she starts to experience nausea, vomiting, severe blurry vision, difficulty staying awake, confusion, weakness or sensation changes to her body, etc.  She tells me she understands.  Tests ordered Orders Placed This Encounter  Procedures  . Hemoglobin A1c  . CMP with eGFR([facility])  . Vitamin D, 25-hydroxy  . Microalbumin/Creatinine Ratio, Urine  . Hep C Antibody  . Ambulatory referral to Obstetrics / Gynecology      No orders of the defined types were placed in this encounter.   Patient to follow-up in 6 weeks or sooner as needed.  Ailene Ards, NP
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
## 2020-06-12 NOTE — Patient Instructions (Signed)
Call 911 if the following occur: ?Inability to awaken the patient at time of expected wakening ?Severe or worsening headaches ?Somnolence or confusion ?Restlessness, unsteadiness, or seizures ?Difficulties with vision ?Vomiting, fever, or stiff neck ?Urinary or bowel incontinence ?Weakness or nddsumbness involving any part of the body

## 2020-06-13 LAB — COMPLETE METABOLIC PANEL WITH GFR
AG Ratio: 2.3 (calc) (ref 1.0–2.5)
ALT: 25 U/L (ref 6–29)
AST: 23 U/L (ref 10–35)
Albumin: 4.9 g/dL (ref 3.6–5.1)
Alkaline phosphatase (APISO): 125 U/L (ref 37–153)
BUN: 10 mg/dL (ref 7–25)
CO2: 29 mmol/L (ref 20–32)
Calcium: 9.7 mg/dL (ref 8.6–10.4)
Chloride: 101 mmol/L (ref 98–110)
Creat: 0.89 mg/dL (ref 0.50–1.05)
GFR, Est African American: 83 mL/min/{1.73_m2} (ref >=60)
GFR, Est Non African American: 72 mL/min/{1.73_m2} (ref >=60)
Globulin: 2.1 g/dL (calc) (ref 1.9–3.7)
Glucose, Bld: 104 mg/dL — ABNORMAL HIGH (ref 65–99)
Potassium: 3.9 mmol/L (ref 3.5–5.3)
Sodium: 141 mmol/L (ref 135–146)
Total Bilirubin: 0.5 mg/dL (ref 0.2–1.2)
Total Protein: 7 g/dL (ref 6.1–8.1)

## 2020-06-13 LAB — HEMOGLOBIN A1C
Hgb A1c MFr Bld: 6.9 % of total Hgb — ABNORMAL HIGH (ref <5.7)
Mean Plasma Glucose: 151 (calc)
eAG (mmol/L): 8.4 (calc)

## 2020-06-13 LAB — VITAMIN D 25 HYDROXY (VIT D DEFICIENCY, FRACTURES): Vit D, 25-Hydroxy: 55 ng/mL (ref 30–100)

## 2020-06-13 LAB — MICROALBUMIN / CREATININE URINE RATIO
Creatinine, Urine: 140 mg/dL (ref 20–275)
Microalb Creat Ratio: 8 mcg/mg creat (ref <30)
Microalb, Ur: 1.1 mg/dL

## 2020-06-13 LAB — HEPATITIS C ANTIBODY
Hepatitis C Ab: NONREACTIVE
SIGNAL TO CUT-OFF: 0.16 (ref <1.00)

## 2020-06-17 ENCOUNTER — Other Ambulatory Visit (INDEPENDENT_AMBULATORY_CARE_PROVIDER_SITE_OTHER): Payer: Self-pay | Admitting: Internal Medicine

## 2020-07-02 ENCOUNTER — Encounter: Payer: Medicare PPO | Admitting: Adult Health

## 2020-07-02 NOTE — Progress Notes (Signed)
Office Visit Note  Patient: Amanda Webster             Date of Birth: 1961-01-12           MRN: [id number]             PCP: [name], MD Referring: [name], MD Visit Date: 07/16/2020 Occupation: @GUAROCC @  Subjective:  Pain in multiple joints.   History of Present Illness: Amanda Webster is a 58 y.o. female seen in consultation per request of her PCP.  According the patient in 2000 she had degenerative disc disease decompression surgery.  Since then she has been experiencing pain all over.  She states the neck is still hurts her off and on but symptoms improved after the surgery.  She has had pain and discomfort in her bilateral hands for many years.  She notices intermittent swelling.  The pain gets worse with the weather change.  She also recalls having nerve conduction velocity and was told that she had bilateral carpal tunnel syndrome.  She did not want surgery.  She states she has been having pain and discomfort in her lower back, her hips, her right knee.  She also has peripheral neuropathy and has burning sensation in her feet at night.  She denies any history of rheumatoid arthritis or any other autoimmune disease.  Gravida 3 para 3.  She had been under care of Dr. Merlene Laughter.  Patient states he diagnosed with fibromyalgia few years back.  There is no history of oral ulcers, nasal ulcers, malar rash, photosensitivity, Raynaud's phenomenon, lymphadenopathy.  Activities of Daily Living:  Patient reports morning stiffness for several hours.    Patient Reports nocturnal pain.  Difficulty dressing/grooming: Denies Difficulty climbing stairs: Reports Difficulty getting out of chair: Reports Difficulty using hands for taps, buttons, cutlery, and/or writing: Reports  Review of Systems  Constitutional: Positive for fatigue. Negative for night sweats, weight gain and weight loss.  HENT: Negative for mouth sores, trouble swallowing, trouble swallowing, mouth dryness  and nose dryness.   Eyes: Positive for itching. Negative for pain, redness, visual disturbance and dryness.  Respiratory: Negative for cough, shortness of breath and difficulty breathing.   Cardiovascular: Negative for chest pain, palpitations, hypertension, irregular heartbeat and swelling in legs/feet.  Gastrointestinal: Positive for constipation. Negative for blood in stool and diarrhea.  Endocrine: Negative for increased urination.  Genitourinary: Negative for difficulty urinating and vaginal dryness.  Musculoskeletal: Positive for arthralgias, joint pain, joint swelling, myalgias, morning stiffness, muscle tenderness and myalgias. Negative for muscle weakness.  Skin: Negative for color change, rash, hair loss, redness, skin tightness, ulcers and sensitivity to sunlight.  Allergic/Immunologic: Negative for susceptible to infections.  Neurological: Positive for numbness, memory loss and weakness. Negative for dizziness, headaches and night sweats.  Hematological: Positive for bruising/bleeding tendency. Negative for swollen glands.  Psychiatric/Behavioral: Positive for confusion and sleep disturbance. Negative for depressed mood. The patient is not nervous/anxious.     PMFS History:  Patient Active Problem List   Diagnosis Date Noted  . Encounter for screening fecal occult blood testing 07/09/2020  . Visit for pelvic exam 07/09/2020  . Mixed stress and urge urinary incontinence 07/09/2020  . Hyperlipidemia associated with type 2 diabetes mellitus ([place]) 11/22/2019  . Seborrheic keratoses 11/22/2019  . Depression, recurrent ([place]) 10/05/2019  . Essential hypertension 08/27/2019  . Uncontrolled diabetes mellitus due to underlying condition with diabetic arthropathy, with long-term current use of insulin ([place]) 08/27/2019  . Chronic intractable headache 08/27/2019  .  Chest pain at rest 03/08/2011  . Obesity 03/08/2011  . FROZEN LEFT SHOULDER 04/01/2010  . Sleep apnea 04/01/2010  .  DIARRHEA, PERSISTENT 05/05/2009  . ABDOMINAL PAIN, RUQ 05/05/2009    Past Medical History:  Diagnosis Date  . Allergy   . Anxiety   . Arnold-Chiari malformation ([place])   . Arthritis   . Borderline diabetic   . Chronic headaches   . Colon polyps    nonadenomatous  . Depression   . Diabetes mellitus without complication ([place])   . Diverticulosis   . Esophageal stricture   . GERD (gastroesophageal reflux disease)   . HTN (hypertension)   . Hyperlipidemia   . IBS (irritable bowel syndrome)   . Seizures ([place])    pt not sure when her last seizure was- she says she just spaces out- no shaking but she could not tell me last seizure-  on [place] bid for seizures   . Sleep apnea    no cpap now- did use 02 with cpap but has not used either in "years"    Family History  Problem Relation Age of Onset  . Colon polyps Mother   . Breast cancer Mother   . Lung cancer Mother   . Heart disease Father   . Stroke Father   . Aortic aneurysm Father   . Chiari malformation Daughter   . Addison's disease Son   . Colon cancer Neg Hx   . Esophageal cancer Neg Hx   . Rectal cancer Neg Hx   . Stomach cancer Neg Hx    Past Surgical History:  Procedure Laterality Date  . BREAST BIOPSY Left   . cahri decompression  6 and 03/1999  . CHOLECYSTECTOMY    . COLONOSCOPY    . invasive cervical traction  02/2009  . left breast-lumpectomy    . POLYPECTOMY    . shunt put in on right side of brain    . SHUNT REPLACEMENT  12/10  . TOTAL ABDOMINAL HYSTERECTOMY  Age 49 yrs   TAH & BSO  . TUBAL LIGATION    . UPPER GASTROINTESTINAL ENDOSCOPY     Social History   Social History Narrative   Disabled from Chiari malformation/seizures.. Drinks 4-5 cups of tea daily. Does not get regular exercise. Married for 40 years.    There is no immunization history on file for this patient.   Objective: Vital Signs: BP 121/81 (BP Location: Right Arm, Patient Position: Sitting, Cuff Size: Normal)   Pulse 64   Resp  16   Ht 5\' 4"  (1.626 m)   Wt 190 lb 6.4 oz (86.4 kg)   BMI 32.68 kg/m    Physical Exam Vitals and nursing note reviewed.  Constitutional:      Appearance: She is well-developed.  HENT:     Head: Normocephalic and atraumatic.  Eyes:     Conjunctiva/sclera: Conjunctivae normal.  Cardiovascular:     Rate and Rhythm: Normal rate and regular rhythm.     Heart sounds: Normal heart sounds.  Pulmonary:     Effort: Pulmonary effort is normal.     Breath sounds: Normal breath sounds.  Abdominal:     General: Bowel sounds are normal.     Palpations: Abdomen is soft.  Musculoskeletal:     Cervical back: Normal range of motion.  Lymphadenopathy:     Cervical: No cervical adenopathy.  Skin:    General: Skin is warm and dry.     Capillary Refill: Capillary refill takes less than 2  seconds.  Neurological:     Mental Status: She is alert and oriented to person, place, and time.  Psychiatric:        Behavior: Behavior normal.      Musculoskeletal Exam: She has painful limited range of motion of her cervical thoracic and lumbar spine.  Shoulder joints, elbow joints with good range of motion.  She has bilateral PIP and DIP thickening with no synovitis.  Hip joints with good range of motion.  Knee joints with good range of motion without any warmth swelling or effusion.  She had no tenderness across ankles or MTPs.  She had generalized hyperalgesia and positive tender points.  CDAI Exam: CDAI Score: -- Patient Global: --; Provider Global: -- Swollen: --; Tender: -- Joint Exam 07/16/2020   No joint exam has been documented for this visit   There is currently no information documented on the homunculus. Go to the Rheumatology activity and complete the homunculus joint exam.  Investigation: No additional findings.  Imaging: No results found.  Recent Labs: Lab Results  Component Value Date   WBC 6.5 09/28/2019   HGB 15.1 09/28/2019   PLT 187 09/28/2019   NA 141 06/12/2020   K 3.9  06/12/2020   CL 101 06/12/2020   CO2 29 06/12/2020   GLUCOSE 104 (H) 06/12/2020   BUN 10 06/12/2020   CREATININE 0.89 06/12/2020   BILITOT 0.5 06/12/2020   ALKPHOS 139 (H) 12/13/2019   AST 23 06/12/2020   ALT 25 06/12/2020   PROT 7.0 06/12/2020   ALBUMIN 4.6 11/22/2019   CALCIUM 9.7 06/12/2020   GFRAA 83 06/12/2020    Speciality Comments: No specialty comments available.  Procedures:  No procedures performed Allergies: Cymbalta [duloxetine hcl], Propoxyphene n-acetaminophen, and Sulfa antibiotics   Assessment / Plan:     Visit Diagnoses: Pain in both hands -patient complains of pain and discomfort in her bilateral hands.  She has bilateral PIP and DIP thickening.  Clinical findings are consistent with osteoarthritis.  No synovitis was noted.  She gives history of intermittent swelling.  I will obtain labs today.- Plan: XR Hand 2 View Right, XR Hand 2 View Left, x-rays were consistent with osteoarthritis.  Sedimentation rate, Rheumatoid factor, Cyclic citrul peptide antibody, IgG, Uric acid  DDD (degenerative disc disease), cervical -patient states that she has decompression surgery in the past.  She continues to have cervical spine discomfort.  She had limited range of motion.  Chronic midline low back pain without sciatica-she complains of ongoing pain and discomfort in her lower back.  Plan: XR Lumbar Spine 2-3 Views.  X-ray showed multilevel spondylosis and facet joint arthropathy.  Chronic pain of both knees -she complains of discomfort in her bilateral knee joints.  No warmth swelling effusion was noted.  Plan: XR KNEE 3 VIEW RIGHT, XR KNEE 3 VIEW LEFT.  X-ray showed mild osteoarthritis and moderate chondromalacia patella.  Myalgia -she states she was diagnosed with fibromyalgia by Dr. Merlene Laughter several years ago.  She has generalized pain, hyperalgesia and positive tender points.  Plan: CK  Essential hypertension-her blood pressure is well controlled.  Other medical problems  are listed as follows:  Hyperlipidemia associated with type 2 diabetes mellitus ([place])  Uncontrolled diabetes mellitus due to underlying condition with diabetic arthropathy, with long-term current use of insulin ([facility])  Depression, recurrent ([place])  Sleep apnea, unspecified type  Seborrheic keratoses  BMI 32.68-weight loss diet and exercise was emphasized.  Orders: Orders Placed This Encounter  Procedures  . XR KNEE  3 VIEW RIGHT  . XR KNEE 3 VIEW LEFT  . XR Hand 2 View Right  . XR Hand 2 View Left  . XR Lumbar Spine 2-3 Views  . CK  . Sedimentation rate  . Rheumatoid factor  . Cyclic citrul peptide antibody, IgG  . Uric acid   No orders of the defined types were placed in this encounter.    Follow-Up Instructions: Return for Pain in multiple joints.   Bo Merino, MD  Note - This record has been created using Editor, commissioning.  Chart creation errors have been sought, but may not always  have been located. Such creation errors do not reflect on  the standard of medical care.

## 2020-07-03 ENCOUNTER — Ambulatory Visit (INDEPENDENT_AMBULATORY_CARE_PROVIDER_SITE_OTHER): Payer: Medicare PPO | Admitting: Clinical

## 2020-07-03 ENCOUNTER — Other Ambulatory Visit: Payer: Self-pay

## 2020-07-03 DIAGNOSIS — F332 Major depressive disorder, recurrent severe without psychotic features: Secondary | ICD-10-CM

## 2020-07-03 NOTE — Progress Notes (Addendum)
Virtual Visit via Video Note  I connected withDarlene S Harbouron 10/21/21at 8:00AM EDTby a video enabled telemedicine application and verified that I am speaking with the correct person using two identifiers.  Location: Patient:Home Provider:Office  I discussed the limitations of evaluation and management by telemedicine and the availability of in person appointments. The patient expressed understanding and agreed to proceed.    THERAPIST PROGRESS NOTE  Session Time:8:00AM-8:45AM  Participation Level:Active  Behavioral Response:CasualAlertDepressed  Type of Therapy:Individual Therapy  Treatment Goals addressed:Coping  Interventions:CBT, Motivational Interviewing and Strength-based  Summary:Amanda S Harbouris a 59 y.o.femalewho presents with Depression.The patient previously completed a course of Outpatient and was able to try not having the support, however, re-engaged requesting continued supportive therapy. The patient completed a updated clinical assessment and today re-started her engagement in Outpatient for ongoing supportive therapy. The OPT therapist utilized Motivational Interviewing to assist in continuing therapeutic repore. The patient in the session was engaged and work in Science writer about hertriggers and symptoms over the past few weeksincludinghaving negative health outcomes from using a "Thrive" diet which caused her blood sugar numbers to spike this also led to the patient becoming much more irritable and had led to conflict between the patient and her significant other. The OPT therapist utilized Cognitive Behavioral Therapy through cognitive restructuring as well as worked with the patient on coping strategies to assist in management ofher mental health symptoms. The patient noted that she is no longer using "Thrive" and she is active in working to manage her blood sugar. The OPT therapist reviewed with the patient  her Treatment Plan for ongoing OPT treatment services.  Suicidal/Homicidal:Nowithout intent/plan  Therapist Response:The OPT therapist worked with the patient for the patients initial re-engaging scheduled session. The patient was engaged in hersession and gave feedback in relation to triggers, symptoms, and behavior responses over the pastfewweeks.  The patient noted since starting "Thrive diet" she has had a lot of difficulty regulating her blood sugar which has caused difficulty with how she feels physically and her interactions with her significant others. The OPT therapist worked with the patient utilizing an in session Cognitive Behavioral Therapy exercise. The patient was responsive in the session and acknowledged her work in implementing coping skills/ distractions as well as utilizing positive thinking.The OPT therapist reviewed with the patient in her upcoming appointment with her PCP to inquire from the physical health professional about using "Thrive" based on her individual health profile.  Plan: Follow-up in 2/3 weeks  Diagnosis:Axis I:Major depressive disorder, recurrent severe without psychotic features  Axis II:No diagnosis  I discussed the assessment and treatment plan with the patient. The patient was provided an opportunity to ask questions and all were answered. The patient agreed with the plan and demonstrated an understanding of the instructions.  The patient was advised to call back or seek an in-person evaluation if the symptoms worsen or if the condition fails to improve as anticipated.  I provided51minutes of non-face-to-face time during this encounter  Amanda Webster, Marlinda Mike  07/03/2020

## 2020-07-09 ENCOUNTER — Other Ambulatory Visit: Payer: Self-pay

## 2020-07-09 ENCOUNTER — Encounter: Payer: Self-pay | Admitting: Adult Health

## 2020-07-09 ENCOUNTER — Ambulatory Visit (INDEPENDENT_AMBULATORY_CARE_PROVIDER_SITE_OTHER): Payer: Medicare PPO | Admitting: Adult Health

## 2020-07-09 VITALS — BP 122/85 | HR 69 | Ht 62.25 in | Wt 187.0 lb

## 2020-07-09 DIAGNOSIS — N3946 Mixed incontinence: Secondary | ICD-10-CM | POA: Diagnosis not present

## 2020-07-09 DIAGNOSIS — Z1211 Encounter for screening for malignant neoplasm of colon: Secondary | ICD-10-CM | POA: Diagnosis not present

## 2020-07-09 DIAGNOSIS — Z01419 Encounter for gynecological examination (general) (routine) without abnormal findings: Secondary | ICD-10-CM | POA: Diagnosis not present

## 2020-07-09 LAB — HEMOCCULT GUIAC POC 1CARD (OFFICE): Fecal Occult Blood, POC: NEGATIVE

## 2020-07-09 NOTE — Progress Notes (Signed)
  Subjective:     Patient ID: Amanda Webster, female   DOB: 12-Feb-1961, 59 y.o.   MRN: [id number]  HPI Amanda Webster is a 59 year old white female,married, sp hysterectomy in for pelvic exam. She is using Thrive.  A1c was 6.9 06/12/20 and had normal mammogram 11/14/19.  PCP is Dr Anastasio Champion.   Review of Systems Has some urinary incontinence at times, esp if increased water Denies any bowel problems  Not sexually active  Reviewed past medical,surgical, social and family history. Reviewed medications and allergies.     Objective:   Physical Exam BP 122/85 (BP Location: Left Arm, Patient Position: Sitting, Cuff Size: Normal)   Pulse 69   Ht 5' 2.25" (1.581 m)   Wt 187 lb (84.8 kg)   BMI 33.93 kg/m  Skin warm and dry.Pelvic: external genitalia is normal in appearance no lesions, vagina: pale with loss of moisture and rugae,urethra has no lesions or masses noted, cervix and uterus are absent, adnexa: no masses or tenderness noted. Bladder is non tender and no masses felt.On rectal exam, has good tone, no masses felt, no hemorrhoids and hemoccult was negative.   AA is 0 Fall risk is low PHQ 9 score is 8, no SI, declines meds  [facility] - 07/09/20 1445      Pregnancy Intention Screening   Does the patient want to become pregnant in the next year? N/A    Does the patient's partner want to become pregnant in the next year? N/A    Would the patient like to discuss contraceptive options today? N/A      Contraception Wrap Up   Current Method --   hyst   End Method --   hyst   Contraception Counseling Provided No         Examination chaperoned by Levy Pupa LPN  Assessment:     1. Visit for pelvic exam Physical with PCP  Labs with PCP  Mammogram yearly  2. Encounter for screening fecal occult blood testing   3. Mixed stress and urge urinary incontinence To to void often  She declines meds     Plan:     Follow up prn

## 2020-07-14 ENCOUNTER — Other Ambulatory Visit (HOSPITAL_COMMUNITY): Payer: Self-pay | Admitting: Neurology

## 2020-07-14 DIAGNOSIS — I6529 Occlusion and stenosis of unspecified carotid artery: Secondary | ICD-10-CM

## 2020-07-16 ENCOUNTER — Encounter: Payer: Self-pay | Admitting: Rheumatology

## 2020-07-16 ENCOUNTER — Ambulatory Visit: Payer: Self-pay

## 2020-07-16 ENCOUNTER — Other Ambulatory Visit: Payer: Self-pay

## 2020-07-16 ENCOUNTER — Ambulatory Visit: Payer: Medicare PPO | Admitting: Rheumatology

## 2020-07-16 VITALS — BP 121/81 | HR 64 | Resp 16 | Ht 64.0 in | Wt 190.4 lb

## 2020-07-16 DIAGNOSIS — Z794 Long term (current) use of insulin: Secondary | ICD-10-CM

## 2020-07-16 DIAGNOSIS — M791 Myalgia, unspecified site: Secondary | ICD-10-CM

## 2020-07-16 DIAGNOSIS — IMO0002 Reserved for concepts with insufficient information to code with codable children: Secondary | ICD-10-CM

## 2020-07-16 DIAGNOSIS — L821 Other seborrheic keratosis: Secondary | ICD-10-CM

## 2020-07-16 DIAGNOSIS — G4730 Sleep apnea, unspecified: Secondary | ICD-10-CM

## 2020-07-16 DIAGNOSIS — E08618 Diabetes mellitus due to underlying condition with other diabetic arthropathy: Secondary | ICD-10-CM

## 2020-07-16 DIAGNOSIS — M25562 Pain in left knee: Secondary | ICD-10-CM

## 2020-07-16 DIAGNOSIS — M79641 Pain in right hand: Secondary | ICD-10-CM

## 2020-07-16 DIAGNOSIS — M545 Low back pain, unspecified: Secondary | ICD-10-CM | POA: Diagnosis not present

## 2020-07-16 DIAGNOSIS — M5030 Other cervical disc degeneration, unspecified cervical region: Secondary | ICD-10-CM | POA: Diagnosis not present

## 2020-07-16 DIAGNOSIS — E785 Hyperlipidemia, unspecified: Secondary | ICD-10-CM

## 2020-07-16 DIAGNOSIS — I10 Essential (primary) hypertension: Secondary | ICD-10-CM

## 2020-07-16 DIAGNOSIS — F339 Major depressive disorder, recurrent, unspecified: Secondary | ICD-10-CM

## 2020-07-16 DIAGNOSIS — G8929 Other chronic pain: Secondary | ICD-10-CM | POA: Diagnosis not present

## 2020-07-16 DIAGNOSIS — M25561 Pain in right knee: Secondary | ICD-10-CM | POA: Diagnosis not present

## 2020-07-16 DIAGNOSIS — E0865 Diabetes mellitus due to underlying condition with hyperglycemia: Secondary | ICD-10-CM

## 2020-07-16 DIAGNOSIS — E1169 Type 2 diabetes mellitus with other specified complication: Secondary | ICD-10-CM

## 2020-07-16 DIAGNOSIS — Z6832 Body mass index (BMI) 32.0-32.9, adult: Secondary | ICD-10-CM

## 2020-07-16 DIAGNOSIS — M79642 Pain in left hand: Secondary | ICD-10-CM

## 2020-07-17 LAB — URIC ACID: Uric Acid, Serum: 4.3 mg/dL (ref 2.5–7.0)

## 2020-07-17 LAB — CYCLIC CITRUL PEPTIDE ANTIBODY, IGG: Cyclic Citrullin Peptide Ab: <16 UNITS

## 2020-07-17 LAB — RHEUMATOID FACTOR: Rheumatoid fact SerPl-aCnc: <14 IU/mL (ref <14)

## 2020-07-17 LAB — SEDIMENTATION RATE: Sed Rate: 2 mm/h (ref 0–30)

## 2020-07-17 LAB — CK: Total CK: 73 U/L (ref 29–143)

## 2020-07-17 NOTE — Progress Notes (Signed)
I will discuss results at the follow-up visit.

## 2020-07-22 ENCOUNTER — Other Ambulatory Visit (INDEPENDENT_AMBULATORY_CARE_PROVIDER_SITE_OTHER): Payer: Self-pay | Admitting: Internal Medicine

## 2020-07-24 ENCOUNTER — Other Ambulatory Visit: Payer: Self-pay

## 2020-07-24 ENCOUNTER — Ambulatory Visit (INDEPENDENT_AMBULATORY_CARE_PROVIDER_SITE_OTHER): Payer: Medicare PPO | Admitting: Clinical

## 2020-07-24 DIAGNOSIS — F332 Major depressive disorder, recurrent severe without psychotic features: Secondary | ICD-10-CM

## 2020-07-24 NOTE — Progress Notes (Signed)
Virtual Visit via Video Note  I connected with Amanda Webster on 07/24/20 at  9:00 AM EST by a video enabled telemedicine application and verified that I am speaking with the correct person using two identifiers.  Location: Patient: Home Provider: Office   I discussed the limitations of evaluation and management by telemedicine and the availability of in person appointments. The patient expressed understanding and agreed to proceed.      THERAPIST PROGRESS NOTE  Session Time:9:00AM-9:55AM  Participation Level:Active  Behavioral Response:CasualAlertDepressed  Type of Therapy:Individual Therapy  Treatment Goals addressed:Coping  Interventions:CBT, Motivational Interviewing and Strength-based  Summary:Amanda S Harbouris a 59 y.o.femalewho presents with Depression. The OPT therapist utilized Motivational Interviewing to assist in continuing therapeutic repore. The patient in the session was engaged and work in Science writer about hertriggers and symptoms over the past few weeksincludingfrustration from health limitations. The OPT therapist utilized Cognitive Behavioral Therapy through cognitive restructuring as well as worked with the patient on coping strategies to assist in management ofher mental health symptoms. The patient noted that she is still working to stay active within her health limitations. The OPT therapistreviewed with the patient her Treatment Plan for ongoing OPT treatment services.  Suicidal/Homicidal:Nowithout intent/plan  Therapist Response:The OPT therapist worked with the patient for the patients session. The patient was engaged in hersession and gave feedback in relation to triggers, symptoms, and behavior responses over the pastfewweeks. The OPT therapist worked with the patient utilizing an in session Cognitive Behavioral Therapy exercise. The patient was responsive in the session andacknowledged her work in  implementing coping skills/ distractions as well as utilizing positive thinking and staying active within her physical health limits.  Plan:Follow-up in 2/3 weeks  Diagnosis:Axis I:Major depressive disorder, recurrent severe without psychotic features  Axis II:No diagnosis  I discussed the assessment and treatment plan with the patient. The patient was provided an opportunity to ask questions and all were answered. The patient agreed with the plan and demonstrated an understanding of the instructions.  The patient was advised to call back or seek an in-person evaluation if the symptoms worsen or if the condition fails to improve as anticipated.  I provided64minutes of non-face-to-face time during this encounter  [name], Marlinda Mike 07/24/2020

## 2020-07-29 ENCOUNTER — Encounter (INDEPENDENT_AMBULATORY_CARE_PROVIDER_SITE_OTHER): Payer: Self-pay

## 2020-07-29 ENCOUNTER — Encounter (INDEPENDENT_AMBULATORY_CARE_PROVIDER_SITE_OTHER): Payer: Self-pay | Admitting: Internal Medicine

## 2020-07-29 ENCOUNTER — Ambulatory Visit (INDEPENDENT_AMBULATORY_CARE_PROVIDER_SITE_OTHER): Payer: Medicare PPO | Admitting: Internal Medicine

## 2020-07-29 ENCOUNTER — Other Ambulatory Visit: Payer: Self-pay

## 2020-07-29 VITALS — BP 146/88 | HR 70 | Temp 97.8°F | Resp 18 | Ht 64.0 in | Wt 188.2 lb

## 2020-07-29 DIAGNOSIS — I10 Essential (primary) hypertension: Secondary | ICD-10-CM

## 2020-07-29 DIAGNOSIS — E1169 Type 2 diabetes mellitus with other specified complication: Secondary | ICD-10-CM

## 2020-07-29 DIAGNOSIS — E08618 Diabetes mellitus due to underlying condition with other diabetic arthropathy: Secondary | ICD-10-CM | POA: Diagnosis not present

## 2020-07-29 DIAGNOSIS — R079 Chest pain, unspecified: Secondary | ICD-10-CM

## 2020-07-29 DIAGNOSIS — E669 Obesity, unspecified: Secondary | ICD-10-CM

## 2020-07-29 DIAGNOSIS — E0865 Diabetes mellitus due to underlying condition with hyperglycemia: Secondary | ICD-10-CM

## 2020-07-29 DIAGNOSIS — E785 Hyperlipidemia, unspecified: Secondary | ICD-10-CM

## 2020-07-29 DIAGNOSIS — Z794 Long term (current) use of insulin: Secondary | ICD-10-CM

## 2020-07-29 DIAGNOSIS — IMO0002 Reserved for concepts with insufficient information to code with codable children: Secondary | ICD-10-CM

## 2020-07-29 NOTE — Progress Notes (Signed)
Metrics: Intervention Frequency ACO  Documented Smoking Status Yearly  Screened one or more times in 24 months  Cessation Counseling or  Active cessation medication Past 24 months  Past 24 months   Guideline developer: UpToDate (See UpToDate for funding source) Date Released: 2014       Wellness Office Visit  Subjective:  Patient ID: Amanda Webster, female    DOB: Oct 24, 1960  Age: 59 y.o. MRN: [id number]  CC: This lady comes in for follow-up of diabetes, hypertension, hyperlipidemia, obesity and joint pains. HPI  She tells me that she has been having chest tightness for the last couple of weeks or so.  This can occur at any time.  She relates this to having indigestion.  It does not seem to be related to exertion.  However, she has many cardiac risk factors. She has been seen by rheumatology regarding her joint pains and she will follow-up soon. Past Medical History:  Diagnosis Date  . Allergy   . Anxiety   . Arnold-Chiari malformation ([place])   . Arthritis   . Borderline diabetic   . Chronic headaches   . Colon polyps    nonadenomatous  . Depression   . Diabetes mellitus without complication ([place])   . Diverticulosis   . Esophageal stricture   . GERD (gastroesophageal reflux disease)   . HTN (hypertension)   . Hyperlipidemia   . IBS (irritable bowel syndrome)   . Seizures ([place])    pt not sure when her last seizure was- she says she just spaces out- no shaking but she could not tell me last seizure-  on Oxtellar bid for seizures   . Sleep apnea    no cpap now- did use 02 with cpap but has not used either in "years"   Past Surgical History:  Procedure Laterality Date  . BREAST BIOPSY Left   . cahri decompression  8 and 03/1999  . CHOLECYSTECTOMY    . COLONOSCOPY    . invasive cervical traction  02/2009  . left breast-lumpectomy    . POLYPECTOMY    . shunt put in on right side of brain    . SHUNT REPLACEMENT  12/10  . TOTAL ABDOMINAL HYSTERECTOMY  Age 18 yrs   TAH &  BSO  . TUBAL LIGATION    . UPPER GASTROINTESTINAL ENDOSCOPY       Family History  Problem Relation Age of Onset  . Colon polyps Mother   . Breast cancer Mother   . Lung cancer Mother   . Heart disease Father   . Stroke Father   . Aortic aneurysm Father   . Chiari malformation Daughter   . Addison's disease Son   . Colon cancer Neg Hx   . Esophageal cancer Neg Hx   . Rectal cancer Neg Hx   . Stomach cancer Neg Hx     Social History   Social History Narrative   Disabled from Chiari malformation/seizures.. Drinks 4-5 cups of tea daily. Does not get regular exercise. Married for 40 years.   Social History   Tobacco Use  . Smoking status: Former Smoker    Quit date: 09/13/1990    Years since quitting: 29.8  . Smokeless tobacco: Never Used  Substance Use Topics  . Alcohol use: No    Current Meds  Medication Sig  . Accu-Chek Softclix Lancets lancets USE AS DIRECTED TWICE DAILY  . AIMOVIG 70 MG/ML SOAJ   . amLODipine (NORVASC) 5 MG tablet Take 5 mg by mouth daily.  Marland Kitchen  aspirin EC 81 MG tablet Take by mouth.  Marland Kitchen atorvastatin (LIPITOR) 20 MG tablet TK 1 T PO QD  . azelastine (ASTELIN) 0.1 % nasal spray Place 1 spray into both nostrils daily at 12 noon.  . cetirizine (ZYRTEC) 10 MG tablet TK 1 T PO QD  . clopidogrel (PLAVIX) 75 MG tablet Plavix 75 mg tablet  Take 1 tablet every day by oral route.  . fluticasone (FLONASE) 50 MCG/ACT nasal spray Place 1 spray into both nostrils daily.  Marland Kitchen gabapentin (NEURONTIN) 300 MG capsule TAKE 1 CAPSULE BY MOUTH AT BEDTIME  . naproxen sodium (ALEVE) 220 MG tablet Take 440 mg by mouth daily as needed (for pain).  . Oxcarbazepine (TRILEPTAL) 300 MG tablet Take 150 mg by mouth 3 (three) times daily.   Marland Kitchen OZEMPIC, 1 MG/DOSE, 4 MG/3ML SOPN ADMINISTER 1 MG UNDER THE SKIN 1 TIME A WEEK  . propranolol (INDERAL) 60 MG tablet Take 1 tablet (60 mg total) by mouth 2 (two) times daily.  Marland Kitchen UNABLE TO FIND Aleve pain relieving lotion-daily  . VITAMIN D PO  Take 10,000 Units by mouth daily.      Depression screen [facility] 2/9 07/09/2020 08/29/2019 08/21/2019 07/20/2016  Decreased Interest 1 0 0 0  Down, Depressed, Hopeless 0 0 0 0  PHQ - 2 Score 1 0 0 0  Altered sleeping 1 - - -  Tired, decreased energy 3 - - -  Change in appetite 3 - - -  Feeling bad or failure about yourself  0 - - -  Trouble concentrating 0 - - -  Moving slowly or fidgety/restless 0 - - -  Suicidal thoughts 0 - - -  PHQ-9 Score 8 - - -     Objective:   Today's Vitals: BP (!) 146/88 (BP Location: Right Arm, Patient Position: Sitting, Cuff Size: Normal)   Pulse 70   Temp 97.8 F (36.6 C) (Temporal)   Resp 18   Ht 5\' 4"  (1.626 m)   Wt 188 lb 3.2 oz (85.4 kg)   SpO2 96%   BMI 32.30 kg/m  Vitals with BMI 07/29/2020 07/16/2020 07/16/2020  Height 5\' 4"  - 5\' 4"   Weight 188 lbs 3 oz - 190 lbs 6 oz  BMI 90.24 - 09.73  Systolic 532 992 426  Diastolic 88 81 82  Pulse 70 64 64     Physical Exam She remains obese but she has lost some weight.  Blood pressure is somewhat elevated today.  No other new physical findings.      Assessment   1. Essential hypertension   2. Uncontrolled diabetes mellitus due to underlying condition with diabetic arthropathy, with long-term current use of insulin ([place])   3. Hyperlipidemia associated with type 2 diabetes mellitus (HCC)   4. Obesity (BMI 30-39.9)   5. Chest pain, unspecified type       Tests ordered Orders Placed This Encounter  Procedures  . Ambulatory referral to Cardiology     Plan: 1. She will continue on antihypertensive medications listed above. 2. She will continue with Ozempic for her diabetes. 3. She will continue on statin therapy for her hyperlipidemia in the face of diabetes. 4. As far as the chest pain/discomfort is concerned, I will refer to cardiology to make sure she does not have coronary artery disease. 5. We discussed again COVID-19 vaccination and I hope she does get this done. 6. Follow-up  with Judson Roch in 3 months.  Tdap booster was given today.   No orders of  the defined types were placed in this encounter.   [name], MD

## 2020-08-05 DIAGNOSIS — M5136 Other intervertebral disc degeneration, lumbar region: Secondary | ICD-10-CM | POA: Insufficient documentation

## 2020-08-05 DIAGNOSIS — M19042 Primary osteoarthritis, left hand: Secondary | ICD-10-CM | POA: Insufficient documentation

## 2020-08-05 DIAGNOSIS — M170 Bilateral primary osteoarthritis of knee: Secondary | ICD-10-CM | POA: Insufficient documentation

## 2020-08-05 DIAGNOSIS — M19041 Primary osteoarthritis, right hand: Secondary | ICD-10-CM | POA: Insufficient documentation

## 2020-08-05 NOTE — Progress Notes (Signed)
Office Visit Note  Patient: Amanda Webster             Date of Birth: 1961-06-25           MRN: [id number]             PCP: [name], MD Referring: [name], MD Visit Date: 08/12/2020 Occupation: @GUAROCC @  Subjective:  Pain in joints and muscles..   History of Present Illness: Amanda Webster is a 59 y.o. female with history of osteoarthritis and fibromyalgia syndrome.  She states she continues to have discomfort in her hands and knee joints from osteoarthritis.  She also has discomfort in her neck and lower back due to underlying disc disease.  She has been having discomfort from fibromyalgia which has been generalized.  She has generalized muscle ache.  She states about a month ago a tree limb fell on her head.  She is a scheduled to have CT scan of her head.  She has chronic headaches.  Activities of Daily Living:  Patient reports morning stiffness for 1 hour.   Patient Reports nocturnal pain.  Difficulty dressing/grooming: Denies Difficulty climbing stairs: Reports Difficulty getting out of chair: Reports Difficulty using hands for taps, buttons, cutlery, and/or writing: Reports  Review of Systems  Constitutional: Positive for fatigue. Negative for night sweats, weight gain and weight loss.  HENT: Positive for mouth dryness. Negative for mouth sores, trouble swallowing, trouble swallowing and nose dryness.   Eyes: Positive for dryness. Negative for pain, redness and visual disturbance.  Respiratory: Negative for cough, shortness of breath and difficulty breathing.   Cardiovascular: Positive for swelling in legs/feet. Negative for chest pain, palpitations, hypertension and irregular heartbeat.  Gastrointestinal: Positive for constipation and diarrhea. Negative for blood in stool.  Endocrine: Positive for cold intolerance, heat intolerance and excessive thirst. Negative for increased urination.  Genitourinary: Negative for difficulty urinating and vaginal  dryness.  Musculoskeletal: Positive for arthralgias, joint pain, muscle weakness, morning stiffness and muscle tenderness. Negative for gait problem, joint swelling, myalgias and myalgias.  Skin: Negative for color change, rash, hair loss, skin tightness, ulcers and sensitivity to sunlight.  Allergic/Immunologic: Negative for susceptible to infections.  Neurological: Positive for weakness. Negative for dizziness, memory loss and night sweats.  Hematological: Positive for bruising/bleeding tendency. Negative for swollen glands.  Psychiatric/Behavioral: Positive for sleep disturbance. Negative for depressed mood. The patient is not nervous/anxious.     PMFS History:  Patient Active Problem List   Diagnosis Date Noted  . History of Chiari malformation 08/12/2020  . Primary osteoarthritis of both hands 08/05/2020  . Primary osteoarthritis of both knees 08/05/2020  . DDD (degenerative disc disease), lumbar 08/05/2020  . Encounter for screening fecal occult blood testing 07/09/2020  . Visit for pelvic exam 07/09/2020  . Mixed stress and urge urinary incontinence 07/09/2020  . Hyperlipidemia associated with type 2 diabetes mellitus ([place]) 11/22/2019  . Seborrheic keratoses 11/22/2019  . Depression, recurrent ([place]) 10/05/2019  . Essential hypertension 08/27/2019  . Uncontrolled diabetes mellitus due to underlying condition with diabetic arthropathy, with long-term current use of insulin ([place]) 08/27/2019  . Chronic intractable headache 08/27/2019  . Chest pain at rest 03/08/2011  . Obesity 03/08/2011  . FROZEN LEFT SHOULDER 04/01/2010  . Sleep apnea 04/01/2010  . DIARRHEA, PERSISTENT 05/05/2009  . ABDOMINAL PAIN, RUQ 05/05/2009    Past Medical History:  Diagnosis Date  . Allergy   . Anxiety   . Arnold-Chiari malformation ([place])   . Arthritis   .  Borderline diabetic   . Chronic headaches   . Colon polyps    nonadenomatous  . Depression   . Diabetes mellitus without complication ([place])     . Diverticulosis   . Esophageal stricture   . GERD (gastroesophageal reflux disease)   . HTN (hypertension)   . Hyperlipidemia   . IBS (irritable bowel syndrome)   . Seizures ([place])    pt not sure when her last seizure was- she says she just spaces out- no shaking but she could not tell me last seizure-  on Oxtellar bid for seizures   . Sleep apnea    no cpap now- did use 02 with cpap but has not used either in "years"    Family History  Problem Relation Age of Onset  . Colon polyps Mother   . Breast cancer Mother   . Lung cancer Mother   . Heart disease Father   . Stroke Father   . Aortic aneurysm Father   . Chiari malformation Daughter   . Addison's disease Son   . Colon cancer Neg Hx   . Esophageal cancer Neg Hx   . Rectal cancer Neg Hx   . Stomach cancer Neg Hx    Past Surgical History:  Procedure Laterality Date  . BREAST BIOPSY Left   . cahri decompression  8 and 03/1999  . CHOLECYSTECTOMY    . COLONOSCOPY    . invasive cervical traction  02/2009  . left breast-lumpectomy    . POLYPECTOMY    . shunt put in on right side of brain    . SHUNT REPLACEMENT  12/10  . TOTAL ABDOMINAL HYSTERECTOMY  Age 34 yrs   TAH & BSO  . TUBAL LIGATION    . UPPER GASTROINTESTINAL ENDOSCOPY     Social History   Social History Narrative   Disabled from Chiari malformation/seizures.. Drinks 4-5 cups of tea daily. Does not get regular exercise. Married for 40 years.   Immunization History  Administered Date(s) Administered  . Tdap 07/29/2020     Objective: Vital Signs: BP (!) 144/78 (BP Location: Left Arm, Patient Position: Sitting, Cuff Size: Normal)   Pulse 71   Resp 15   Ht 5' 2"  (1.575 m)   Wt 192 lb (87.1 kg)   BMI 35.12 kg/m    Physical Exam Vitals and nursing note reviewed.  Constitutional:      Appearance: She is well-developed.  HENT:     Head: Normocephalic and atraumatic.  Eyes:     Conjunctiva/sclera: Conjunctivae normal.  Cardiovascular:     Rate and  Rhythm: Normal rate and regular rhythm.     Heart sounds: Normal heart sounds.  Pulmonary:     Effort: Pulmonary effort is normal.     Breath sounds: Normal breath sounds.  Abdominal:     General: Bowel sounds are normal.     Palpations: Abdomen is soft.  Musculoskeletal:     Cervical back: Normal range of motion.  Lymphadenopathy:     Cervical: No cervical adenopathy.  Skin:    General: Skin is warm and dry.     Capillary Refill: Capillary refill takes less than 2 seconds.  Neurological:     Mental Status: She is alert and oriented to person, place, and time.  Psychiatric:        Behavior: Behavior normal.      Musculoskeletal Exam: C-spine was in good range of motion.  She had discomfort range of motion for lumbar spine.  Shoulder joints, elbow  joints, wrist joints with good range of motion.  She has bilateral PIP and DIP thickening with no synovitis.  Hip joints and knee joints with good range of motion with no swelling.  She had no tenderness across MTPs.  She had generalized hyperalgesia and positive tender points.  CDAI Exam: CDAI Score: -- Patient Global: --; Provider Global: -- Swollen: --; Tender: -- Joint Exam 08/12/2020   No joint exam has been documented for this visit   There is currently no information documented on the homunculus. Go to the Rheumatology activity and complete the homunculus joint exam.  Investigation: No additional findings.  Imaging: XR Hand 2 View Left  Result Date: 07/16/2020 CMC, PIP and DIP narrowing was noted.  No MCP, intercarpal or radiocarpal joint space narrowing was noted.  No erosive changes were noted. Impression: These findings are consistent with osteoarthritis.  XR Hand 2 View Right  Result Date: 07/16/2020 CMC, PIP and DIP narrowing was noted.  No MCP, intercarpal or radiocarpal joint space narrowing was noted.  No erosive changes were noted. Impression: These findings are consistent with osteoarthritis.  XR KNEE 3 VIEW  LEFT  Result Date: 07/16/2020 Mild medial compartment narrowing was noted.  Intercondylar osteophytes were noted.  Moderate patellofemoral narrowing was noted.  No chondrocalcinosis was noted. Impression: These findings are consistent with mild osteoarthritis and moderate chondromalacia patella.  XR KNEE 3 VIEW RIGHT  Result Date: 07/16/2020 Mild medial compartment narrowing and moderate patellofemoral narrowing was noted.  No chondrocalcinosis was noted. Impression: These findings are consistent with mild osteoarthritis and moderate chondromalacia patella.  XR Lumbar Spine 2-3 Views  Result Date: 07/16/2020 Multilevel spondylosis with most significant narrowing between L3-4, L4-5, L5-S1 was noted.  Facet joint arthropathy was noted. Impression: These findings are consistent with multilevel spondylosis and facet joint arthropathy.   Recent Labs: Lab Results  Component Value Date   WBC 6.5 09/28/2019   HGB 15.1 09/28/2019   PLT 187 09/28/2019   NA 141 06/12/2020   K 3.9 06/12/2020   CL 101 06/12/2020   CO2 29 06/12/2020   GLUCOSE 104 (H) 06/12/2020   BUN 10 06/12/2020   CREATININE 0.89 06/12/2020   BILITOT 0.5 06/12/2020   ALKPHOS 139 (H) 12/13/2019   AST 23 06/12/2020   ALT 25 06/12/2020   PROT 7.0 06/12/2020   ALBUMIN 4.6 11/22/2019   CALCIUM 9.7 06/12/2020   GFRAA 83 06/12/2020   July 16, 2020 CK 73, ESR 2, RF negative, anti-CCP negative, uric acid 4.3  Speciality Comments: No specialty comments available.  Procedures:  No procedures performed Allergies: Cymbalta [duloxetine hcl], Propoxyphene n-acetaminophen, and Sulfa antibiotics   Assessment / Plan:     Visit Diagnoses: Primary osteoarthritis of both hands-she continues to have pain and discomfort in her hands.  No swelling was noted.  Joint protection muscle strengthening was discussed.  Primary osteoarthritis of both knees - Bilateral mild osteoarthritis and moderate chondromalacia patella.  Chronic pain.  She  is on medications.  DDD (degenerative disc disease), cervical-she has limited range of motion with some discomfort.  DDD (degenerative disc disease), lumbar - Multilevel spondylosis and facet joint arthropathy.  She has chronic pain.  Fibromyalgia - Diagnosed by Dr. Merlene Laughter.  CK normal.  Hyperlipidemia associated with type 2 diabetes mellitus ([place])  Essential hypertension  Uncontrolled diabetes mellitus due to underlying condition with diabetic arthropathy, with long-term current use of insulin ([place])  History of Chiari malformation - Status post shunt placement.  Patient states she had recent injury  by a tree trunk falling on her head.  She has a CT scan scheduled.  Depression, recurrent ([place])  Sleep apnea, unspecified type  Seborrheic keratoses  BMI 32.0-32.9,adult  Osteoporosis screening-I will schedule DEXA.  Use of resistive exercises, calcium and vitamin D intake was discussed.  Postmenopausal  Orders: Orders Placed This Encounter  Procedures  . DG BONE DENSITY (DXA)   No orders of the defined types were placed in this encounter.   Follow-Up Instructions: Return in about 3 months (around 11/10/2020) for Osteoarthritis, Discuss DXA results .   Bo Merino, MD  Note - This record has been created using Editor, commissioning.  Chart creation errors have been sought, but may not always  have been located. Such creation errors do not reflect on  the standard of medical care.

## 2020-08-12 ENCOUNTER — Encounter: Payer: Self-pay | Admitting: Rheumatology

## 2020-08-12 ENCOUNTER — Ambulatory Visit: Payer: Medicare PPO | Admitting: Rheumatology

## 2020-08-12 ENCOUNTER — Other Ambulatory Visit: Payer: Self-pay

## 2020-08-12 VITALS — BP 144/78 | HR 71 | Resp 15 | Ht 62.0 in | Wt 192.0 lb

## 2020-08-12 DIAGNOSIS — Z780 Asymptomatic menopausal state: Secondary | ICD-10-CM

## 2020-08-12 DIAGNOSIS — M19041 Primary osteoarthritis, right hand: Secondary | ICD-10-CM | POA: Diagnosis not present

## 2020-08-12 DIAGNOSIS — M19042 Primary osteoarthritis, left hand: Secondary | ICD-10-CM

## 2020-08-12 DIAGNOSIS — M170 Bilateral primary osteoarthritis of knee: Secondary | ICD-10-CM

## 2020-08-12 DIAGNOSIS — Z13820 Encounter for screening for osteoporosis: Secondary | ICD-10-CM

## 2020-08-12 DIAGNOSIS — Z8669 Personal history of other diseases of the nervous system and sense organs: Secondary | ICD-10-CM | POA: Insufficient documentation

## 2020-08-12 DIAGNOSIS — G4730 Sleep apnea, unspecified: Secondary | ICD-10-CM

## 2020-08-12 DIAGNOSIS — Z7189 Other specified counseling: Secondary | ICD-10-CM

## 2020-08-12 DIAGNOSIS — E0865 Diabetes mellitus due to underlying condition with hyperglycemia: Secondary | ICD-10-CM

## 2020-08-12 DIAGNOSIS — L821 Other seborrheic keratosis: Secondary | ICD-10-CM

## 2020-08-12 DIAGNOSIS — F339 Major depressive disorder, recurrent, unspecified: Secondary | ICD-10-CM

## 2020-08-12 DIAGNOSIS — M797 Fibromyalgia: Secondary | ICD-10-CM

## 2020-08-12 DIAGNOSIS — E08618 Diabetes mellitus due to underlying condition with other diabetic arthropathy: Secondary | ICD-10-CM

## 2020-08-12 DIAGNOSIS — M5030 Other cervical disc degeneration, unspecified cervical region: Secondary | ICD-10-CM

## 2020-08-12 DIAGNOSIS — Z794 Long term (current) use of insulin: Secondary | ICD-10-CM

## 2020-08-12 DIAGNOSIS — M5136 Other intervertebral disc degeneration, lumbar region: Secondary | ICD-10-CM

## 2020-08-12 DIAGNOSIS — E1169 Type 2 diabetes mellitus with other specified complication: Secondary | ICD-10-CM

## 2020-08-12 DIAGNOSIS — E785 Hyperlipidemia, unspecified: Secondary | ICD-10-CM

## 2020-08-12 DIAGNOSIS — Z6832 Body mass index (BMI) 32.0-32.9, adult: Secondary | ICD-10-CM

## 2020-08-12 DIAGNOSIS — IMO0002 Reserved for concepts with insufficient information to code with codable children: Secondary | ICD-10-CM

## 2020-08-12 DIAGNOSIS — I10 Essential (primary) hypertension: Secondary | ICD-10-CM

## 2020-08-12 NOTE — Patient Instructions (Signed)

## 2020-08-13 ENCOUNTER — Ambulatory Visit (HOSPITAL_COMMUNITY): Payer: Medicare PPO

## 2020-08-13 ENCOUNTER — Other Ambulatory Visit (HOSPITAL_COMMUNITY): Payer: Medicare PPO

## 2020-08-14 ENCOUNTER — Ambulatory Visit (HOSPITAL_COMMUNITY): Admission: RE | Admit: 2020-08-14 | Payer: Medicare PPO | Source: Ambulatory Visit

## 2020-08-14 ENCOUNTER — Other Ambulatory Visit: Payer: Self-pay

## 2020-08-14 ENCOUNTER — Encounter (HOSPITAL_COMMUNITY): Payer: Self-pay

## 2020-08-14 ENCOUNTER — Ambulatory Visit (INDEPENDENT_AMBULATORY_CARE_PROVIDER_SITE_OTHER): Payer: Medicare PPO | Admitting: Clinical

## 2020-08-14 ENCOUNTER — Ambulatory Visit (HOSPITAL_COMMUNITY)
Admission: RE | Admit: 2020-08-14 | Discharge: 2020-08-14 | Disposition: A | Discharge status: Home / Self Care | Payer: Medicare PPO | Source: Ambulatory Visit | Attending: Neurology | Admitting: Neurology

## 2020-08-14 DIAGNOSIS — I6529 Occlusion and stenosis of unspecified carotid artery: Secondary | ICD-10-CM | POA: Insufficient documentation

## 2020-08-14 DIAGNOSIS — F332 Major depressive disorder, recurrent severe without psychotic features: Secondary | ICD-10-CM

## 2020-08-14 LAB — POCT I-STAT CREATININE: Creatinine, Ser: 0.8 mg/dL (ref 0.44–1.00)

## 2020-08-14 MED ORDER — IOHEXOL 350 MG/ML SOLN
75.0000 mL | Freq: Once | INTRAVENOUS | Status: AC | PRN
  Administered 2020-08-14: 75 mL via INTRAVENOUS

## 2020-08-14 NOTE — Progress Notes (Signed)
  Virtual Visit via Video Note  I connected Amanda Webster on 08/14/20 at  9:00 AM EST by a video enabled telemedicine application and verified that I am speaking with the correct person using two identifiers.  Location: Patient: Home Provider: Office  I discussed the limitations of evaluation and management by telemedicine and the availability of in person appointments. The patient expressed understanding and agreed to proceed.      THERAPIST PROGRESS NOTE  Session Time:9:00AM-9:40AM  Participation Level:Active  Behavioral Response:CasualAlertDepressed  Type of Therapy:Individual Therapy  Treatment Goals addressed:Coping  Interventions:CBT, Motivational Interviewing and Strength-based  Summary:Amanda Webster a 59 y.o.femalewho presents with Depression. The OPT therapist utilized Motivational Interviewing to assist incontinuingtherapeutic repore. The patient in the session was engaged and work in Science writer about hertriggers and symptoms over the past few weeksincludingfrustration from health limitations, testing, and awaiting results to confirm if sh has sustained a concussion from a outdoors incident where she was struck in the head by a tree branch.. The OPT therapist utilized Cognitive Behavioral Therapy through cognitive restructuring as well as worked with the patient on coping strategies to assist in management ofher mental health symptoms.The patient noted that she is still working to stay active within her health limitations and is looking forward to the warmer weather over the course of the next few days.The OPT therapistreviewedwith the patient her Treatment Plan for ongoing OPT treatment services.  Suicidal/Homicidal:Nowithout intent/plan  Therapist Response:The OPT therapist worked with the patient for the patients session. The patient was engaged in hersession and gave feedback in relation to  triggers, symptoms, and behavior responses over the pastfewweeks.The OPT therapist worked with the patient utilizing an in session Cognitive Behavioral Therapy exercise. The patient was responsive in the session andacknowledged her work in implementing coping skills/ distractions as well as utilizing positive thinking and staying active within her physical health limits and indicated looking forward to being active in the next few days with the warmer weather coming. The OPT therapist gauged the patients mood over the course of the past week including her interactions with family over the recent Thanksgiving Holiday. The OPT therapist will continue treatment work with the patient in her next scheduled session  Plan:Follow-up in 2/3 weeks  Diagnosis:Axis I:Major depressive disorder, recurrent severe without psychotic features  Axis II:No diagnosis  I discussed the assessment and treatment plan with the patient. The patient was provided an opportunity to ask questions and all were answered. The patient agreed with the plan and demonstrated an understanding of the instructions.  The patient was advised to call back or seek an in-person evaluation if the symptoms worsen or if the condition fails to improve as anticipated.  I provided12minutes of non-face-to-face time during this encounter  [name], Amanda Webster 08/15/2011

## 2020-08-26 DIAGNOSIS — G40109 Localization-related (focal) (partial) symptomatic epilepsy and epileptic syndromes with simple partial seizures, not intractable, without status epilepticus: Secondary | ICD-10-CM | POA: Insufficient documentation

## 2020-09-02 ENCOUNTER — Ambulatory Visit (HOSPITAL_COMMUNITY): Payer: Medicare PPO | Admitting: Clinical

## 2020-09-02 ENCOUNTER — Telehealth (HOSPITAL_COMMUNITY): Payer: Self-pay | Admitting: Clinical

## 2020-09-02 ENCOUNTER — Other Ambulatory Visit: Payer: Self-pay

## 2020-09-02 NOTE — Telephone Encounter (Signed)
The patient did not respond to video link or phone call. The OPT left a VM, however, the patient did not respond and missed their appointment.

## 2020-09-16 ENCOUNTER — Ambulatory Visit (HOSPITAL_COMMUNITY)
Admission: RE | Admit: 2020-09-16 | Discharge: 2020-09-16 | Disposition: A | Discharge status: Home / Self Care | Payer: Medicare PPO | Source: Ambulatory Visit | Attending: Rheumatology | Admitting: Rheumatology

## 2020-09-16 ENCOUNTER — Other Ambulatory Visit: Payer: Self-pay

## 2020-09-16 DIAGNOSIS — Z13820 Encounter for screening for osteoporosis: Secondary | ICD-10-CM | POA: Insufficient documentation

## 2020-09-16 DIAGNOSIS — Z780 Asymptomatic menopausal state: Secondary | ICD-10-CM | POA: Diagnosis present

## 2020-09-16 NOTE — Progress Notes (Signed)
DXA is normal.  Repeat DEXA will be in 5 years.  She should continue to exercise, consume foods rich in calcium and vitamin D.

## 2020-09-23 ENCOUNTER — Other Ambulatory Visit (INDEPENDENT_AMBULATORY_CARE_PROVIDER_SITE_OTHER): Payer: Self-pay | Admitting: Internal Medicine

## 2020-09-23 ENCOUNTER — Other Ambulatory Visit: Payer: Self-pay | Admitting: Family Medicine

## 2020-09-24 ENCOUNTER — Other Ambulatory Visit (INDEPENDENT_AMBULATORY_CARE_PROVIDER_SITE_OTHER): Payer: Self-pay | Admitting: Internal Medicine

## 2020-10-15 ENCOUNTER — Telehealth: Payer: Self-pay | Admitting: Cardiology

## 2020-10-15 ENCOUNTER — Ambulatory Visit (INDEPENDENT_AMBULATORY_CARE_PROVIDER_SITE_OTHER): Payer: Medicare PPO

## 2020-10-15 ENCOUNTER — Ambulatory Visit: Payer: Medicare PPO | Admitting: Cardiology

## 2020-10-15 ENCOUNTER — Other Ambulatory Visit: Payer: Self-pay | Admitting: Cardiology

## 2020-10-15 ENCOUNTER — Encounter: Payer: Self-pay | Admitting: Cardiology

## 2020-10-15 VITALS — BP 126/70 | HR 58 | Ht 62.0 in | Wt 195.8 lb

## 2020-10-15 DIAGNOSIS — R002 Palpitations: Secondary | ICD-10-CM

## 2020-10-15 DIAGNOSIS — R0789 Other chest pain: Secondary | ICD-10-CM

## 2020-10-15 NOTE — Telephone Encounter (Signed)
PERCERT FOR:   7 DAY ZIO

## 2020-10-15 NOTE — Patient Instructions (Signed)
Your physician recommends that you schedule a follow-up appointment in: 1 MONTH WITH EXTENDER   Your physician recommends that you continue on your current medications as directed. Please refer to the Current Medication list given to you today.  7 DAY ZIO MONITOR   Thank you for choosing [facility]!!    

## 2020-10-15 NOTE — Progress Notes (Signed)
Clinical Summary Amanda Webster is a 60 y.o.female seen today for follow up of the following medical problems.   1. Chest pain  From prior visit:  - seen in ER 07/18/16 with CP - thought to be chestwall pain from bronchitis, coughing. Given z-pak. Prilosec given for GERD symptoms.  - pain started several months ago. Started a pressure, midchest. 5-6/10 in severity. Can have some diaphoresis. Can have some SOB. Occurs several times a week. Not positional. No relation to food. Symptoms can last up to 1 hour. Can occur at anytime. - can have frequent belching - DOE at 1 block, which is stable.  - CAD risk factors: DM2, previous history of HTN now off meds, ?HL, +tobacco in the past about 18 years total, father MIs late 28s to early 93s - cannot run on treadmill due to chronic back pain.  - normal stress test 08/2016     - recently symptoms of chest pains, palpitations.  - + palpitations, pressure, +SOB. Typically occurs at night - Lasts 5-10 minutes. Can awake her from sleep - sedentary lifestyle.  - pepsi 16 oz per day, no coffee, no tea, no energy drinks, no EtOH.   2. OSA - does not wear cpap due to discomfort      On plavix per Dr Merlene Laughter Past Medical History:  Diagnosis Date  . Allergy   . Anxiety   . Arnold-Chiari malformation ([place])   . Arthritis   . Borderline diabetic   . Chronic headaches   . Colon polyps    nonadenomatous  . Depression   . Diabetes mellitus without complication ([place])   . Diverticulosis   . Esophageal stricture   . GERD (gastroesophageal reflux disease)   . HTN (hypertension)   . Hyperlipidemia   . IBS (irritable bowel syndrome)   . Seizures ([place])    pt not sure when her last seizure was- she says she just spaces out- no shaking but she could not tell me last seizure-  on Oxtellar bid for seizures   . Sleep apnea    no cpap now- did use 02 with cpap but has not used either in "years"     Allergies  Allergen Reactions  .  Cymbalta [Duloxetine Hcl] Itching    Nasal itching  . Propoxyphene N-Acetaminophen Itching  . Sulfa Antibiotics      Current Outpatient Medications  Medication Sig Dispense Refill  . ACCU-CHEK AVIVA PLUS test strip USE TO TEST BLOOD SUGAR TWICE DAILY 100 strip 3  . Accu-Chek Softclix Lancets lancets USE AS DIRECTED TWICE DAILY 100 each 3  . AIMOVIG 70 MG/ML SOAJ     . amLODipine (NORVASC) 5 MG tablet Take 5 mg by mouth daily.    Marland Kitchen aspirin EC 81 MG tablet Take by mouth.    Marland Kitchen atorvastatin (LIPITOR) 20 MG tablet TK 1 T PO QD 90 tablet 3  . azelastine (ASTELIN) 0.1 % nasal spray Place 1 spray into both nostrils daily at 12 noon.    . cetirizine (ZYRTEC) 10 MG tablet TK 1 T PO QD 30 tablet 5  . ciprofloxacin (CIPRO) 500 MG tablet TAKE 1 TABLET(500 MG) BY MOUTH TWICE DAILY 6 tablet 0  . clopidogrel (PLAVIX) 75 MG tablet Plavix 75 mg tablet  Take 1 tablet every day by oral route.    . fluticasone (FLONASE) 50 MCG/ACT nasal spray Place 1 spray into both nostrils daily. 16 g 5  . gabapentin (NEURONTIN) 300 MG capsule TAKE 1 CAPSULE  BY MOUTH AT BEDTIME 30 capsule 2  . naproxen sodium (ALEVE) 220 MG tablet Take 440 mg by mouth daily as needed (for pain).    . Oxcarbazepine (TRILEPTAL) 300 MG tablet Take 150 mg by mouth 3 (three) times daily.     Marland Kitchen OZEMPIC, 1 MG/DOSE, 4 MG/3ML SOPN ADMINISTER 1 MG UNDER THE SKIN 1 TIME A WEEK 3 mL 3  . propranolol (INDERAL) 60 MG tablet TAKE 1 TABLET(60 MG) BY MOUTH TWICE DAILY 180 tablet 1  . UNABLE TO FIND Aleve pain relieving lotion-daily    . VITAMIN D PO Take 10,000 Units by mouth daily.     No current facility-administered medications for this visit.     Past Surgical History:  Procedure Laterality Date  . BREAST BIOPSY Left   . cahri decompression  6 and 03/1999  . CHOLECYSTECTOMY    . COLONOSCOPY    . invasive cervical traction  02/2009  . left breast-lumpectomy    . POLYPECTOMY    . shunt put in on right side of brain    . SHUNT REPLACEMENT   12/10  . TOTAL ABDOMINAL HYSTERECTOMY  Age 86 yrs   TAH & BSO  . TUBAL LIGATION    . UPPER GASTROINTESTINAL ENDOSCOPY       Allergies  Allergen Reactions  . Cymbalta [Duloxetine Hcl] Itching    Nasal itching  . Propoxyphene N-Acetaminophen Itching  . Sulfa Antibiotics       Family History  Problem Relation Age of Onset  . Colon polyps Mother   . Breast cancer Mother   . Lung cancer Mother   . Heart disease Father   . Stroke Father   . Aortic aneurysm Father   . Chiari malformation Daughter   . Addison's disease Son   . Colon cancer Neg Hx   . Esophageal cancer Neg Hx   . Rectal cancer Neg Hx   . Stomach cancer Neg Hx      Social History Amanda Webster reports that she quit smoking about 30 years ago. Her smoking use included cigarettes. She has a 45.00 pack-year smoking history. She has never used smokeless tobacco. Amanda Webster reports no history of alcohol use.   Review of Systems CONSTITUTIONAL: No weight loss, fever, chills, weakness or fatigue.  HEENT: Eyes: No visual loss, blurred vision, double vision or yellow sclerae.No hearing loss, sneezing, congestion, runny nose or sore throat.  SKIN: No rash or itching.  CARDIOVASCULAR: per hpi RESPIRATORY: No shortness of breath, cough or sputum.  GASTROINTESTINAL: No anorexia, nausea, vomiting or diarrhea. No abdominal pain or blood.  GENITOURINARY: No burning on urination, no polyuria NEUROLOGICAL: No headache, dizziness, syncope, paralysis, ataxia, numbness or tingling in the extremities. No change in bowel or bladder control.  MUSCULOSKELETAL: No muscle, back pain, joint pain or stiffness.  LYMPHATICS: No enlarged nodes. No history of splenectomy.  PSYCHIATRIC: No history of depression or anxiety.  ENDOCRINOLOGIC: No reports of sweating, cold or heat intolerance. No polyuria or polydipsia.  Marland Kitchen   Physical Examination Today's Vitals   10/15/20 1435  BP: 126/70  Pulse: (!) 58  SpO2: 98%  Weight: 195 lb 12.8 oz  (88.8 kg)  Height: 5\' 2"  (1.575 m)   Body mass index is 35.81 kg/m.  Gen: resting comfortably, no acute distress HEENT: no scleral icterus, pupils equal round and reactive, no palptable cervical adenopathy,  CV: RRR, no m/r/g, no jvd Resp: Clear to auscultation bilaterally GI: abdomen is soft, non-tender, non-distended, normal bowel sounds, no  hepatosplenomegaly MSK: extremities are warm, no edema.  Skin: warm, no rash Neuro:  no focal deficits Psych: appropriate affect   Diagnostic Studies 08/2016 Nuclear stress test  No diagnostic ST segment changes to indicate ischemia.  No significant myocardial perfusion defects to indicate scar or ischemia.  This is a low risk study.  Nuclear stress EF: 80%.     Assessment and Plan  1. Chest pain/Palpitations - negative stress test 08/2016 - recent symptoms of palpitations and chest pain more suggestive of a symptomatic arrhythmia - EKG today shows NSR - we will plan for 7 day zio patch for further evaluation.          Arnoldo Lenis, M.D.

## 2020-10-22 DIAGNOSIS — R002 Palpitations: Secondary | ICD-10-CM

## 2020-10-29 NOTE — Progress Notes (Signed)
Office Visit Note  Patient: Amanda Webster             Date of Birth: 01-15-1961           MRN: [id number]             PCP: [name], MD Referring: [name], MD Visit Date: 11/11/2020 Occupation: @GUAROCC @  Subjective:  Lower back pain   History of Present Illness: Amanda Webster is a 60 y.o. female with history of osteoarthritis, DDD, and fibromyalgia. She presents today with increased lower back pain after performing yard work yesterday. She denies any symptoms of radiculopathy.  She states the pain in band-like in her lower back. She has been using a cane to assist with ambulation and to help with her instability. She denies any recent falls. She has ongoing pain and stiffness in both knee joints but denies any joint swelling. She states she has a fibromyalgia flare 1-2 times per week.  She takes gabapentin 600 mg at bedtime.  She has not been using ice or heat recently. She states she continues to have interrupted sleep at night.  She has persistent fatigue secondary insomnia.     Activities of Daily Living:  Patient reports morning stiffness for 1  hour.   Patient Reports nocturnal pain.  Difficulty dressing/grooming: Reports Difficulty climbing stairs: Reports Difficulty getting out of chair: Reports Difficulty using hands for taps, buttons, cutlery, and/or writing: Reports  Review of Systems  Constitutional: Positive for fatigue.  HENT: Negative for mouth sores, mouth dryness and nose dryness.   Eyes: Positive for dryness. Negative for pain and itching.  Respiratory: Negative for shortness of breath and difficulty breathing.   Cardiovascular: Negative for chest pain and palpitations.  Gastrointestinal: Negative for blood in stool, constipation and diarrhea.  Endocrine: Negative for increased urination.  Genitourinary: Negative for difficulty urinating.  Musculoskeletal: Positive for arthralgias, gait problem, joint pain, joint swelling, myalgias,  muscle weakness, morning stiffness, muscle tenderness and myalgias.  Skin: Negative for color change, rash and redness.  Allergic/Immunologic: Negative for susceptible to infections.  Neurological: Positive for numbness and memory loss. Negative for dizziness, headaches and weakness.  Hematological: Positive for bruising/bleeding tendency.  Psychiatric/Behavioral: Positive for confusion.    PMFS History:  Patient Active Problem List   Diagnosis Date Noted  . History of Chiari malformation 08/12/2020  . Primary osteoarthritis of both hands 08/05/2020  . Primary osteoarthritis of both knees 08/05/2020  . DDD (degenerative disc disease), lumbar 08/05/2020  . Encounter for screening fecal occult blood testing 07/09/2020  . Visit for pelvic exam 07/09/2020  . Mixed stress and urge urinary incontinence 07/09/2020  . Hyperlipidemia associated with type 2 diabetes mellitus ([place]) 11/22/2019  . Seborrheic keratoses 11/22/2019  . Depression, recurrent ([place]) 10/05/2019  . Essential hypertension 08/27/2019  . Uncontrolled diabetes mellitus due to underlying condition with diabetic arthropathy, with long-term current use of insulin ([place]) 08/27/2019  . Chronic intractable headache 08/27/2019  . Chest pain at rest 03/08/2011  . Obesity 03/08/2011  . FROZEN LEFT SHOULDER 04/01/2010  . Sleep apnea 04/01/2010  . DIARRHEA, PERSISTENT 05/05/2009  . ABDOMINAL PAIN, RUQ 05/05/2009    Past Medical History:  Diagnosis Date  . Allergy   . Anxiety   . Arnold-Chiari malformation ([place])   . Arthritis   . Borderline diabetic   . Chronic headaches   . Colon polyps    nonadenomatous  . Depression   . Diabetes mellitus without complication ([place])   .  Diverticulosis   . Esophageal stricture   . GERD (gastroesophageal reflux disease)   . HTN (hypertension)   . Hyperlipidemia   . IBS (irritable bowel syndrome)   . Seizures ([place])    pt not sure when her last seizure was- she says she just spaces out- no  shaking but she could not tell me last seizure-  on Oxtellar bid for seizures   . Sleep apnea    no cpap now- did use 02 with cpap but has not used either in "years"    Family History  Problem Relation Age of Onset  . Colon polyps Mother   . Breast cancer Mother   . Lung cancer Mother   . Heart disease Father   . Stroke Father   . Aortic aneurysm Father   . Chiari malformation Daughter   . Addison's disease Son   . Colon cancer Neg Hx   . Esophageal cancer Neg Hx   . Rectal cancer Neg Hx   . Stomach cancer Neg Hx    Past Surgical History:  Procedure Laterality Date  . BREAST BIOPSY Left   . cahri decompression  6 and 03/1999  . CHOLECYSTECTOMY    . COLONOSCOPY    . invasive cervical traction  02/2009  . left breast-lumpectomy    . POLYPECTOMY    . shunt put in on right side of brain    . SHUNT REPLACEMENT  12/10  . TOTAL ABDOMINAL HYSTERECTOMY  Age 35 yrs   TAH & BSO  . TUBAL LIGATION    . UPPER GASTROINTESTINAL ENDOSCOPY     Social History   Social History Narrative   Disabled from Chiari malformation/seizures.. Drinks 4-5 cups of tea daily. Does not get regular exercise. Married for 40 years.   Immunization History  Administered Date(s) Administered  . Tdap 07/29/2020     Objective: Vital Signs: BP 140/85 (BP Location: Left Arm, Patient Position: Sitting, Cuff Size: Normal)   Pulse 67   Resp 16   Ht 5\' 2"  (1.575 m)   Wt 196 lb 3.2 oz (89 kg)   BMI 35.89 kg/m    Physical Exam Vitals and nursing note reviewed.  Constitutional:      Appearance: She is well-developed and well-nourished.  HENT:     Head: Normocephalic and atraumatic.  Eyes:     Extraocular Movements: EOM normal.     Conjunctiva/sclera: Conjunctivae normal.  Cardiovascular:     Pulses: Intact distal pulses.  Pulmonary:     Effort: Pulmonary effort is normal.  Abdominal:     Palpations: Abdomen is soft.  Musculoskeletal:     Cervical back: Normal range of motion.  Skin:    General:  Skin is warm and dry.     Capillary Refill: Capillary refill takes less than 2 seconds.  Neurological:     Mental Status: She is alert and oriented to person, place, and time.  Psychiatric:        Mood and Affect: Mood and affect normal.        Behavior: Behavior normal.      Musculoskeletal Exam: Generalized hyperalgesia and positive tender points.  C-spine good ROM.  Trapezius muscle tension and tenderness bilaterally.  Painful ROM of thoracic and lumbar spine. No SI joint tenderness or midline spinal tenderness.  Shoulder joints, elbow joints, wrist joints, MCPs, PIPs, and DIPs good ROM with no synovitis.  Mild PIP and DIP thickening noted. Complete fist formation bilaterally.  Hip joints, knee joints, and ankle joints  good ROM with no discomfort.  No warmth or effusion of knee joints. Bilateral knee crepitus.  No tenderness or swelling of ankles.  CDAI Exam: CDAI Score: -- Patient Global: --; Provider Global: -- Swollen: --; Tender: -- Joint Exam 11/11/2020   No joint exam has been documented for this visit   There is currently no information documented on the homunculus. Go to the Rheumatology activity and complete the homunculus joint exam.  Investigation: No additional findings.  Imaging: No results found.  Recent Labs: Lab Results  Component Value Date   WBC 6.5 09/28/2019   HGB 15.1 09/28/2019   PLT 187 09/28/2019   NA 135 10/30/2020   K 4.1 10/30/2020   CL 100 10/30/2020   CO2 28 10/30/2020   GLUCOSE 153 (H) 10/30/2020   BUN 11 10/30/2020   CREATININE 0.82 10/30/2020   BILITOT 0.5 10/30/2020   ALKPHOS 139 (H) 12/13/2019   AST 20 10/30/2020   ALT 27 10/30/2020   PROT 6.9 10/30/2020   ALBUMIN 4.6 11/22/2019   CALCIUM 9.7 10/30/2020   GFRAA 92 10/30/2020    Speciality Comments: No specialty comments available.  Procedures:  No procedures performed Allergies: Cymbalta [duloxetine hcl], Propoxyphene n-acetaminophen, and Sulfa antibiotics       Assessment / Plan:     Visit Diagnoses: Primary osteoarthritis of both hands: She has PIP and DIP thickening consistent with osteoarthritis of both hands.  She has no joint tenderness or inflammation on exam.  Complete fist formation bilaterally. Discussed the importance of joint protection and muscle strengthening.  She was advised to notify us if she develops increased joint pain or joint swelling. She will follow up in 6 months.   Primary osteoarthritis of both knees - Bilateral mild osteoarthritis and moderate chondromalacia patella.  She has good range of motion of both knee joints on examination today.  Bilateral knee crepitus was noted.  No warmth or effusion was noted.  We discussed the importance of lower extremity muscle strengthening.  A referral to physical therapy was placed today.  She plans on continuing to use a cane to assist with ambulation.- Plan: Ambulatory referral to Physical Therapy  DDD (degenerative disc disease), cervical: C-spine has good range of motion with some discomfort and stiffness.  She has no symptoms of radiculopathy.  Tenderness over bilateral trapezius muscles noted.   DDD (degenerative disc disease), lumbar - Multilevel spondylosis and facet joint arthropathy.  She presents today with acute on chronic pain in her lower back.  She has performing yard work yesterday which has exacerbated her discomfort.  She has bandlike discomfort in the lumbar region.  No midline spinal tenderness or SI joint tenderness.  She is not experiencing any symptoms of radiculopathy or muscle weakness. No bowel or bladder incontinence.  She has not been experiencing any muscle spasms.  X-rays of the lumbar spine were obtained on 07/16/2020 which were reviewed today in the office.  Different treatment options were discussed.  She will be referred to physical therapy.  We also discussed using ice and heat for symptomatic relief.  She was given a Salonpas patch sample today while in the  office to try.  She was advised to notify us if her discomfort persists or worsens.- Plan: Ambulatory referral to Physical Therapy  Fibromyalgia - Diagnosed by Dr. Merlene Laughter.  CK normal. She has generalized hyperalgesia and positive tender points on exam.  She has been experiencing a fibromyalgia flare 1-2 times per week over the past several months.  She continues  to have chronic fatigue secondary to insomnia.  She continues to have interrupted sleep at night but overall feels as though she has been getting more restorative sleep.  We discussed the importance of regular exercise and good sleep hygiene.  She will remain on gabapentin 600 mg at bedtime.- Plan: Ambulatory referral to Physical Therapy  Osteoporosis screening - DEXA 09/16/20 The BMD measured at Femur Neck Left is 0.920 g/cm2 with a T-score of -0.8.  Other medical conditions are listed as follows:   Hyperlipidemia associated with type 2 diabetes mellitus ([place])  Uncontrolled diabetes mellitus due to underlying condition with diabetic arthropathy, with long-term current use of insulin ([place])  Essential hypertension  Depression, recurrent ([place])  History of Chiari malformation - Status post shunt placement.   Sleep apnea, unspecified type  Seborrheic keratoses  Orders: Orders Placed This Encounter  Procedures  . Ambulatory referral to Physical Therapy   No orders of the defined types were placed in this encounter.    Follow-Up Instructions: Return in about 6 months (around 05/14/2021) for Osteoarthritis, Fibromyalgia, DDD.   Ofilia Neas, PA-C  Note - This record has been created using Dragon software.  Chart creation errors have been sought, but may not always  have been located. Such creation errors do not reflect on  the standard of medical care.

## 2020-10-30 ENCOUNTER — Other Ambulatory Visit: Payer: Self-pay

## 2020-10-30 ENCOUNTER — Ambulatory Visit (INDEPENDENT_AMBULATORY_CARE_PROVIDER_SITE_OTHER): Payer: Medicare PPO | Admitting: Nurse Practitioner

## 2020-10-30 ENCOUNTER — Encounter (INDEPENDENT_AMBULATORY_CARE_PROVIDER_SITE_OTHER): Payer: Self-pay | Admitting: Nurse Practitioner

## 2020-10-30 VITALS — BP 121/93 | HR 69 | Temp 97.7°F | Ht 62.0 in | Wt 198.0 lb

## 2020-10-30 DIAGNOSIS — E559 Vitamin D deficiency, unspecified: Secondary | ICD-10-CM

## 2020-10-30 DIAGNOSIS — I10 Essential (primary) hypertension: Secondary | ICD-10-CM

## 2020-10-30 DIAGNOSIS — IMO0002 Reserved for concepts with insufficient information to code with codable children: Secondary | ICD-10-CM

## 2020-10-30 DIAGNOSIS — R42 Dizziness and giddiness: Secondary | ICD-10-CM

## 2020-10-30 DIAGNOSIS — E0865 Diabetes mellitus due to underlying condition with hyperglycemia: Secondary | ICD-10-CM

## 2020-10-30 DIAGNOSIS — E08618 Diabetes mellitus due to underlying condition with other diabetic arthropathy: Secondary | ICD-10-CM

## 2020-10-30 DIAGNOSIS — Z794 Long term (current) use of insulin: Secondary | ICD-10-CM

## 2020-10-30 NOTE — Progress Notes (Signed)
Subjective:  Patient ID: Amanda Webster, female    DOB: [birth year]  Age: 60 y.o. MRN: [id number]  CC:  Chief Complaint  Patient presents with  . Follow-up    Weather changing like yesterday made me feel so much better. My balance has been off its been crazy and it makes me stumble.    . Hypertension  . Diabetes  . Other    Dizziness, vitamin D deficiency      HPI  This patient arrives today for the above.  Hypertension: She continues on amlodipine and propanolol.  Diabetes: Last A1c was 6.9.  She is on statin and Ozempic.  She denies any personal history or family history of thyroid cancer.  Dizziness: She has been experiencing some dizziness that has been gotten progressively worse over the last year.  She tells me that originally she is been experiencing intermittent dizziness and headaches for years ever since she underwent Chiari decompression.  She tells me she has a shunt in place in her brain to drain excess spinal fluid.  She used to see neurosurgery at [facility] with Dr. Prince Rome, but has not seen them in a while.  She tells me that her dizziness feels like a disequilibrium but not so much of vertigo.  She will sometimes use a cane to prevent falls.  She denies any alleviating or aggravating factors.  She tells me that there is no pattern to the episodes and they just seem to happen randomly.  She does report cardiac palpitations and she is undergoing investigation for these with cardiology.  She reports that she will sometimes experience palpitations during the dizzy episodes but not always.    Vitamin D deficiency: She continues on vitamin D3 supplement.  Last serum check showed vitamin D level of 55.  Past Medical History:  Diagnosis Date  . Allergy   . Anxiety   . Arnold-Chiari malformation ([place])   . Arthritis   . Borderline diabetic   . Chronic headaches   . Colon polyps    nonadenomatous  . Depression   . Diabetes mellitus without complication ([place])   .  Diverticulosis   . Esophageal stricture   . GERD (gastroesophageal reflux disease)   . HTN (hypertension)   . Hyperlipidemia   . IBS (irritable bowel syndrome)   . Seizures ([place])    pt not sure when her last seizure was- she says she just spaces out- no shaking but she could not tell me last seizure-  on O[place] bid for seizures   . Sleep apnea    no cpap now- did use 02 with cpap but has not used either in "years"      Family History  Problem Relation Age of Onset  . Colon polyps Mother   . Breast cancer Mother   . Lung cancer Mother   . Heart disease Father   . Stroke Father   . Aortic aneurysm Father   . Chiari malformation Daughter   . Addison's disease Son   . Colon cancer Neg Hx   . Esophageal cancer Neg Hx   . Rectal cancer Neg Hx   . Stomach cancer Neg Hx     Social History   Social History Narrative   Disabled from Chiari malformation/seizures.. Drinks 4-5 cups of tea daily. Does not get regular exercise. Married for 40 years.   Social History   Tobacco Use  . Smoking status: Former Smoker    Packs/day: 1.50    Years:  30.00    Pack years: 45.00    Types: Cigarettes    Quit date: 09/13/1990    Years since quitting: 30.1  . Smokeless tobacco: Never Used  Substance Use Topics  . Alcohol use: No     Current Meds  Medication Sig  . ACCU-CHEK AVIVA PLUS test strip USE TO TEST BLOOD SUGAR TWICE DAILY  . Accu-Chek Softclix Lancets lancets USE AS DIRECTED TWICE DAILY  . AIMOVIG 70 MG/ML SOAJ   . amLODipine (NORVASC) 5 MG tablet Take 5 mg by mouth daily.  Marland Kitchen atorvastatin (LIPITOR) 20 MG tablet TK 1 T PO QD  . azelastine (ASTELIN) 0.1 % nasal spray Place 1 spray into both nostrils daily at 12 noon.  . cetirizine (ZYRTEC) 10 MG tablet TK 1 T PO QD  . clopidogrel (PLAVIX) 75 MG tablet Plavix 75 mg tablet  Take 1 tablet every day by oral route.  . fluticasone (FLONASE) 50 MCG/ACT nasal spray Place 1 spray into both nostrils daily.  Marland Kitchen gabapentin (NEURONTIN) 300  MG capsule TAKE 1 CAPSULE BY MOUTH AT BEDTIME  . naproxen sodium (ALEVE) 220 MG tablet Take 440 mg by mouth daily as needed (for pain).  . Oxcarbazepine (TRILEPTAL) 300 MG tablet Take 150 mg by mouth 3 (three) times daily.   Marland Kitchen OZEMPIC, 1 MG/DOSE, 4 MG/3ML SOPN ADMINISTER 1 MG UNDER THE SKIN 1 TIME A WEEK  . propranolol (INDERAL) 60 MG tablet TAKE 1 TABLET(60 MG) BY MOUTH TWICE DAILY  . UNABLE TO FIND Aleve pain relieving lotion-daily  . VITAMIN D PO Take 10,000 Units by mouth daily.    ROS:  Review of Systems  Eyes: Positive for blurred vision.  Cardiovascular: Negative for chest pain and palpitations.  Gastrointestinal: Positive for nausea. Negative for vomiting.  Neurological: Positive for dizziness, sensory change, weakness and headaches.     Objective:   Today's Vitals: BP (!) 121/93   Pulse 69   Temp 97.7 F (36.5 C)   Ht 5' 2" (1.575 m)   Wt 198 lb (89.8 kg)   SpO2 97%   BMI 36.21 kg/m  Vitals with BMI 10/30/2020 10/15/2020 08/12/2020  Height 5' 2" 5' 2" 5' 2"  Weight 198 lbs 195 lbs 13 oz 192 lbs  BMI 36.21 36.6 44.03  Systolic 474 259 563  Diastolic 93 70 78  Pulse 69 58 71     Physical Exam Vitals reviewed.  Constitutional:      General: She is not in acute distress.    Appearance: Normal appearance.  HENT:     Head: Normocephalic and atraumatic.  Neck:     Vascular: No carotid bruit.  Cardiovascular:     Rate and Rhythm: Normal rate and regular rhythm.     Pulses: Normal pulses.     Heart sounds: Normal heart sounds.  Pulmonary:     Effort: Pulmonary effort is normal.     Breath sounds: Normal breath sounds.  Skin:    General: Skin is warm and dry.  Neurological:     General: No focal deficit present.     Mental Status: She is alert and oriented to person, place, and time.     Sensory: Sensation is intact.     Motor: Motor function is intact.     Coordination: Coordination is intact.     Gait: Gait abnormal (antalgic due to right hip pain).   Psychiatric:        Mood and Affect: Mood normal.  Behavior: Behavior normal.        Judgment: Judgment normal.          Assessment and Plan   1. Essential hypertension   2. Uncontrolled diabetes mellitus due to underlying condition with diabetic arthropathy, with long-term current use of insulin ([place])   3. Vitamin D deficiency disease   4. Dizziness      Plan: 1.  She will continue on her current antihypertensives as prescribed. 2.  We will check A1c today as well as check urine for microalbuminuria.  She continue on her current medications as prescribed. 3.  We will check serum vitamin D level today. 4.  Will refer back to patient's neurosurgery group for further evaluation.  I was going to try and order MRI of the brain, however she mentioned that this can sometimes affect the programming of her shunt.  Thus I would feel more comfortable if her neurosurgery group managed her symptoms so that I do not inadvertently cause problems with her shunt.  If work-up with cardiology and neurosurgery remain negative may consider referral to physical therapy to help with balance.  In the meantime she was told to continue using her cane as needed to help prevent falls.  Tests ordered Orders Placed This Encounter  Procedures  . Hemoglobin A1c  . Vitamin D, 25-hydroxy  . CMP with eGFR([facility])  . Microalbumin/Creatinine Ratio, Urine  . Ambulatory referral to Neurosurgery      No orders of the defined types were placed in this encounter.   Patient to follow-up in 2 months or sooner as needed.  Ailene Ards, NP

## 2020-10-31 LAB — COMPLETE METABOLIC PANEL WITH GFR
AG Ratio: 2.3 (calc) (ref 1.0–2.5)
ALT: 27 U/L (ref 6–29)
AST: 20 U/L (ref 10–35)
Albumin: 4.8 g/dL (ref 3.6–5.1)
Alkaline phosphatase (APISO): 134 U/L (ref 37–153)
BUN: 11 mg/dL (ref 7–25)
CO2: 28 mmol/L (ref 20–32)
Calcium: 9.7 mg/dL (ref 8.6–10.4)
Chloride: 100 mmol/L (ref 98–110)
Creat: 0.82 mg/dL (ref 0.50–1.05)
GFR, Est African American: 92 mL/min/{1.73_m2} (ref >=60)
GFR, Est Non African American: 79 mL/min/{1.73_m2} (ref >=60)
Globulin: 2.1 g/dL (calc) (ref 1.9–3.7)
Glucose, Bld: 153 mg/dL — ABNORMAL HIGH (ref 65–99)
Potassium: 4.1 mmol/L (ref 3.5–5.3)
Sodium: 135 mmol/L (ref 135–146)
Total Bilirubin: 0.5 mg/dL (ref 0.2–1.2)
Total Protein: 6.9 g/dL (ref 6.1–8.1)

## 2020-10-31 LAB — HEMOGLOBIN A1C
Hgb A1c MFr Bld: 7 % of total Hgb — ABNORMAL HIGH (ref <5.7)
Mean Plasma Glucose: 154 mg/dL
eAG (mmol/L): 8.5 mmol/L

## 2020-10-31 LAB — MICROALBUMIN / CREATININE URINE RATIO
Creatinine, Urine: 160 mg/dL (ref 20–275)
Microalb Creat Ratio: 4 mcg/mg creat (ref <30)
Microalb, Ur: 0.6 mg/dL

## 2020-10-31 LAB — VITAMIN D 25 HYDROXY (VIT D DEFICIENCY, FRACTURES): Vit D, 25-Hydroxy: 55 ng/mL (ref 30–100)

## 2020-11-01 ENCOUNTER — Other Ambulatory Visit (INDEPENDENT_AMBULATORY_CARE_PROVIDER_SITE_OTHER): Payer: Self-pay | Admitting: Internal Medicine

## 2020-11-11 ENCOUNTER — Other Ambulatory Visit: Payer: Self-pay

## 2020-11-11 ENCOUNTER — Encounter: Payer: Self-pay | Admitting: Physician Assistant

## 2020-11-11 ENCOUNTER — Ambulatory Visit: Payer: Medicare PPO | Admitting: Physician Assistant

## 2020-11-11 VITALS — BP 140/85 | HR 67 | Resp 16 | Ht 62.0 in | Wt 196.2 lb

## 2020-11-11 DIAGNOSIS — E0865 Diabetes mellitus due to underlying condition with hyperglycemia: Secondary | ICD-10-CM

## 2020-11-11 DIAGNOSIS — E785 Hyperlipidemia, unspecified: Secondary | ICD-10-CM

## 2020-11-11 DIAGNOSIS — M5030 Other cervical disc degeneration, unspecified cervical region: Secondary | ICD-10-CM

## 2020-11-11 DIAGNOSIS — M5136 Other intervertebral disc degeneration, lumbar region: Secondary | ICD-10-CM

## 2020-11-11 DIAGNOSIS — E1169 Type 2 diabetes mellitus with other specified complication: Secondary | ICD-10-CM

## 2020-11-11 DIAGNOSIS — M19041 Primary osteoarthritis, right hand: Secondary | ICD-10-CM

## 2020-11-11 DIAGNOSIS — Z8669 Personal history of other diseases of the nervous system and sense organs: Secondary | ICD-10-CM

## 2020-11-11 DIAGNOSIS — G4730 Sleep apnea, unspecified: Secondary | ICD-10-CM

## 2020-11-11 DIAGNOSIS — F339 Major depressive disorder, recurrent, unspecified: Secondary | ICD-10-CM

## 2020-11-11 DIAGNOSIS — Z794 Long term (current) use of insulin: Secondary | ICD-10-CM

## 2020-11-11 DIAGNOSIS — M19042 Primary osteoarthritis, left hand: Secondary | ICD-10-CM

## 2020-11-11 DIAGNOSIS — M170 Bilateral primary osteoarthritis of knee: Secondary | ICD-10-CM | POA: Diagnosis not present

## 2020-11-11 DIAGNOSIS — E08618 Diabetes mellitus due to underlying condition with other diabetic arthropathy: Secondary | ICD-10-CM

## 2020-11-11 DIAGNOSIS — Z13820 Encounter for screening for osteoporosis: Secondary | ICD-10-CM

## 2020-11-11 DIAGNOSIS — M797 Fibromyalgia: Secondary | ICD-10-CM

## 2020-11-11 DIAGNOSIS — L821 Other seborrheic keratosis: Secondary | ICD-10-CM

## 2020-11-11 DIAGNOSIS — IMO0002 Reserved for concepts with insufficient information to code with codable children: Secondary | ICD-10-CM

## 2020-11-11 DIAGNOSIS — I10 Essential (primary) hypertension: Secondary | ICD-10-CM

## 2020-11-11 NOTE — Patient Instructions (Signed)
Core Strength Exercises Ask your health care provider which exercises are safe for you. Do exercises exactly as told by your health care provider and adjust them as directed. It is normal to feel mild stretching, pulling, tightness, or discomfort as you do these exercises. Stop right away if you feel sudden pain or your pain gets worse. Do not begin these exercises until told by your health care provider. Benefits of core strength exercises Core exercises help to build strength in the muscles between your ribs and your hips (abdominal muscles). These muscles help to support your body and keep your spine stable. It is important to maintain strength in your core to prevent injury and pain. Some activities, such as yoga and Pilates, can help to strengthen core muscles. You can also strengthen core muscles with exercises at home. It is important to talk to your health care provider before you start a new exercise routine. Core strength exercises can:  Reduce back pain.  Help to rebuild strength after a back or spine injury.  Help to prevent injury during physical activity, especially injuries to the back, hips, and knees. How to do core strength exercises Repeat these exercises 10-15 times, or until you are tired. Stop if you feel any pain while doing these exercises. Contact your health care provider if your pain continues or gets worse while doing or after doing core strength exercises. For strength exercises that are done on the floor, use a padded yoga mat or an exercise mat. Bridging 1. Lie on your back on a firm surface with your knees bent and your feet flat on the floor. 2. Raise your hips so that your knees, hips, and shoulders together form a straight line. Do not excessively arch your back. Keep your abdominal muscles tight. 3. Hold this position for 3-5 seconds. 4. Slowly lower your hips to the starting position. 5. Let your muscles relax completely between repetitions.   Single-leg  bridge 1. Lie on your back on a firm surface with your knees bent and your feet flat on the floor. 2. Raise your hips so that your knees, hips, and shoulders together form a straight line. Do not excessively arch your back. Keep your abdominal muscles tight. 3. Lift one foot off the floor while maintaining alignment in your knees, hips, and shoulders. Then, completely straighten the lifted leg. 4. Hold this position for 3-5 seconds. 5. Put the straight leg back down in the bent position. 6. Slowly lower your hips to the starting position. 7. Repeat these steps using your other leg. Side bridge 1. Lie on your side with your knees bent. Prop yourself up on the elbow that is near the floor. 2. Using your abdominal muscles and the elbow you are propped up on, raise your body off the floor. Raise your hip so that your shoulder, hip, and foot together form a straight line. 3. Hold this position for 10 seconds. Keep your head and neck raised and away from your shoulder (in their normal, neutral position). Keep your abdominal muscles tight. 4. Slowly lower your hip to the starting position. 5. Repeat and try to hold this position longer, working your way up to 30 seconds. Abdominal crunch 1. Lie on your back on a firm surface. Bend your knees and keep your feet flat on the floor. 2. Cross your arms over your chest. 3. Without bending your neck, tip your chin slightly toward your chest. 4. Tighten your abdominal muscles as you lift your chest just high  enough to lift your shoulder blades off the floor. Do not hold your breath. You can do this with short lifts or long lifts. 5. Slowly return to the starting position. Bird dog 1. Get on your hands and knees, with your legs shoulder-width apart and your arms under your shoulders. Keep your back straight. 2. Tighten your abdominal muscles. 3. Raise one of your legs off the floor and straighten it. Try to keep it parallel to the floor. 4. Slowly lower your  leg to the starting position. 5. Raise one of your arms off the floor and straighten it. Try to keep it parallel to the floor. 6. Slowly lower your arm to the starting position. 7. Repeat with the other arm and leg. If possible, try raising a leg and an arm at the same time, on opposite sides of the body. For example, raise your left hand and your right leg.   Plank 1. Lie on your belly. 2. Prop up your body onto your forearms and your feet, keeping your legs straight. Your body should make a straight line between your shoulders and feet. 3. Hold this position for 10 seconds while keeping your abdominal muscles tight. 4. Lower your body to the starting position. 5. Repeat and try to hold this position longer, working your way up to 30 seconds.   Cross-core strengthening 1. Stand with your feet shoulder-width apart. 2. Hold a ball out in front of you. Keep your arms straight. 3. Tighten your abdominal muscles and slowly rotate at your waist from side to side. Keep your feet flat. 4. Once you are comfortable, try repeating this exercise with a heavier ball. Top core strengthening 1. Stand about 18 inches (46 cm) out from a wall, with your back to the wall. 2. Keep your feet flat and shoulder-width apart. 3. Tighten your abdominal muscles. 4. Bend your hips and knees. 5. Slowly reach between your legs to touch the wall behind you. 6. Slowly stand back up. 7. Raise your arms over your head and reach behind you. 8. Return to the starting position. General tips  Do not do any exercises that cause pain. If you have pain while exercising, talk to your health care provider.  Always stretch before and after doing these exercises. This can help prevent injury.  Maintain a healthy weight. Ask your health care provider what weight is healthy for you. Contact a health care provider if:  You have back pain that gets worse or does not go away.  You feel pain while doing core strength  exercises. Get help right away if:  You have severe pain that does not get better with medicine. Summary  Core exercises help to build strength in the muscles between your ribs and your waist.  Core muscles help to support your body and keep your spine stable.  Some activities, such as yoga and Pilates, can help to strengthen core muscles.  Core strength exercises can help back pain and can prevent injury.  Stop if you feel any pain while doing core strength exercises. This information is not intended to replace advice given to you by your health care provider. Make sure you discuss any questions you have with your health care provider. Document Revised: 05/27/2020 Document Reviewed: 05/27/2020 Elsevier Patient Education  2021 [place]. Back Exercises These exercises help to make your trunk and back strong. They also help to keep the lower back flexible. Doing these exercises can help to prevent back pain or lessen existing pain.  If you have back pain, try to do these exercises 2-3 times each day or as told by your doctor.  As you get better, do the exercises once each day. Repeat the exercises more often as told by your doctor.  To stop back pain from coming back, do the exercises once each day, or as told by your doctor. Exercises Single knee to chest Do these steps 3-5 times in a row for each leg: 1. Lie on your back on a firm bed or the floor with your legs stretched out. 2. Bring one knee to your chest. 3. Grab your knee or thigh with both hands and hold them it in place. 4. Pull on your knee until you feel a gentle stretch in your lower back or buttocks. 5. Keep doing the stretch for 10-30 seconds. 6. Slowly let go of your leg and straighten it. Pelvic tilt Do these steps 5-10 times in a row: 1. Lie on your back on a firm bed or the floor with your legs stretched out. 2. Bend your knees so they point up to the ceiling. Your feet should be flat on the  floor. 3. Tighten your lower belly (abdomen) muscles to press your lower back against the floor. This will make your tailbone point up to the ceiling instead of pointing down to your feet or the floor. 4. Stay in this position for 5-10 seconds while you gently tighten your muscles and breathe evenly. Cat-cow Do these steps until your lower back bends more easily: 1. Get on your hands and knees on a firm surface. Keep your hands under your shoulders, and keep your knees under your hips. You may put padding under your knees. 2. Let your head hang down toward your chest. Tighten (contract) the muscles in your belly. Point your tailbone toward the floor so your lower back becomes rounded like the back of a cat. 3. Stay in this position for 5 seconds. 4. Slowly lift your head. Let the muscles of your belly relax. Point your tailbone up toward the ceiling so your back forms a sagging arch like the back of a cow. 5. Stay in this position for 5 seconds.   Press-ups Do these steps 5-10 times in a row: 1. Lie on your belly (face-down) on the floor. 2. Place your hands near your head, about shoulder-width apart. 3. While you keep your back relaxed and keep your hips on the floor, slowly straighten your arms to raise the top half of your body and lift your shoulders. Do not use your back muscles. You may change where you place your hands in order to make yourself more comfortable. 4. Stay in this position for 5 seconds. 5. Slowly return to lying flat on the floor.   Bridges Do these steps 10 times in a row: 1. Lie on your back on a firm surface. 2. Bend your knees so they point up to the ceiling. Your feet should be flat on the floor. Your arms should be flat at your sides, next to your body. 3. Tighten your butt muscles and lift your butt off the floor until your waist is almost as high as your knees. If you do not feel the muscles working in your butt and the back of your thighs, slide your feet 1-2  inches farther away from your butt. 4. Stay in this position for 3-5 seconds. 5. Slowly lower your butt to the floor, and let your butt muscles relax. If this exercise is too  easy, try doing it with your arms crossed over your chest.   Belly crunches Do these steps 5-10 times in a row: 1. Lie on your back on a firm bed or the floor with your legs stretched out. 2. Bend your knees so they point up to the ceiling. Your feet should be flat on the floor. 3. Cross your arms over your chest. 4. Tip your chin a little bit toward your chest but do not bend your neck. 5. Tighten your belly muscles and slowly raise your chest just enough to lift your shoulder blades a tiny bit off of the floor. Avoid raising your body higher than that, because it can put too much stress on your low back. 6. Slowly lower your chest and your head to the floor. Back lifts Do these steps 5-10 times in a row: 1. Lie on your belly (face-down) with your arms at your sides, and rest your forehead on the floor. 2. Tighten the muscles in your legs and your butt. 3. Slowly lift your chest off of the floor while you keep your hips on the floor. Keep the back of your head in line with the curve in your back. Look at the floor while you do this. 4. Stay in this position for 3-5 seconds. 5. Slowly lower your chest and your face to the floor. Contact a doctor if:  Your back pain gets a lot worse when you do an exercise.  Your back pain does not get better 2 hours after you exercise. If you have any of these problems, stop doing the exercises. Do not do them again unless your doctor says it is okay. Get help right away if:  You have sudden, very bad back pain. If this happens, stop doing the exercises. Do not do them again unless your doctor says it is okay. This information is not intended to replace advice given to you by your health care provider. Make sure you discuss any questions you have with your health care  provider. Document Revised: 05/25/2018 Document Reviewed: 05/25/2018 Elsevier Patient Education  2021 [employer].

## 2020-11-12 NOTE — Progress Notes (Deleted)
Cardiology Office Note  Date: 11/12/2020   ID: Amanda Webster, DOB 1961/05/29, MRN [id number]  PCP:  [name], MD  Cardiologist:  Carlyle Dolly, MD Electrophysiologist:  None   Chief Complaint: Follow up palpitations / CP / SOB  History of Present Illness: Amanda Webster is a 60 y.o. female with a history of palpitations, chest pains, SOB, OSA not on CPAP, DM2, HTN, HLD.  Last seen by Dr Harl Bowie 10/15/2020. Had recent symptoms of chest pain / pressure, SOB, palpitations. On plavix per neurology. Plan for 7 day Zio patch for evaluation of palpitations.  Past Medical History:  Diagnosis Date  . Allergy   . Anxiety   . Arnold-Chiari malformation ([place])   . Arthritis   . Borderline diabetic   . Chronic headaches   . Colon polyps    nonadenomatous  . Depression   . Diabetes mellitus without complication ([place])   . Diverticulosis   . Esophageal stricture   . GERD (gastroesophageal reflux disease)   . HTN (hypertension)   . Hyperlipidemia   . IBS (irritable bowel syndrome)   . Seizures ([place])    pt not sure when her last seizure was- she says she just spaces out- no shaking but she could not tell me last seizure-  on Oxtellar bid for seizures   . Sleep apnea    no cpap now- did use 02 with cpap but has not used either in "years"    Past Surgical History:  Procedure Laterality Date  . BREAST BIOPSY Left   . cahri decompression  6 and 03/1999  . CHOLECYSTECTOMY    . COLONOSCOPY    . invasive cervical traction  02/2009  . left breast-lumpectomy    . POLYPECTOMY    . shunt put in on right side of brain    . SHUNT REPLACEMENT  12/10  . TOTAL ABDOMINAL HYSTERECTOMY  Age 89 yrs   TAH & BSO  . TUBAL LIGATION    . UPPER GASTROINTESTINAL ENDOSCOPY      Current Outpatient Medications  Medication Sig Dispense Refill  . ACCU-CHEK AVIVA PLUS test strip USE TO TEST BLOOD SUGAR TWICE DAILY 100 strip 3  . Accu-Chek Softclix Lancets lancets USE AS DIRECTED TWICE DAILY  100 each 3  . AIMOVIG 70 MG/ML SOAJ every 30 (thirty) days.    Marland Kitchen amLODipine (NORVASC) 5 MG tablet Take 5 mg by mouth daily.    Marland Kitchen atorvastatin (LIPITOR) 20 MG tablet TK 1 T PO QD 90 tablet 3  . azelastine (ASTELIN) 0.1 % nasal spray Place 1 spray into both nostrils daily at 12 noon.    . cetirizine (ZYRTEC) 10 MG tablet TK 1 T PO QD 30 tablet 5  . clopidogrel (PLAVIX) 75 MG tablet Plavix 75 mg tablet  Take 1 tablet every day by oral route.    . fluticasone (FLONASE) 50 MCG/ACT nasal spray Place 1 spray into both nostrils daily. 16 g 5  . gabapentin (NEURONTIN) 300 MG capsule TAKE 1 CAPSULE BY MOUTH AT BEDTIME (Patient taking differently: Take 600 mg by mouth at bedtime.) 30 capsule 2  . naproxen sodium (ALEVE) 220 MG tablet Take 440 mg by mouth daily as needed (for pain).    . Oxcarbazepine (TRILEPTAL) 300 MG tablet Take 150 mg by mouth 3 (three) times daily.     Marland Kitchen OZEMPIC, 1 MG/DOSE, 4 MG/3ML SOPN ADMINISTER 1 MG UNDER THE SKIN 1 TIME A WEEK 3 mL 3  . propranolol (INDERAL) 60  MG tablet TAKE 1 TABLET(60 MG) BY MOUTH TWICE DAILY 180 tablet 1  . UNABLE TO FIND Aleve pain relieving lotion-daily    . VITAMIN D PO Take 10,000 Units by mouth daily.     No current facility-administered medications for this visit.   Allergies:  Cymbalta [duloxetine hcl], Propoxyphene n-acetaminophen, and Sulfa antibiotics   Social History: The patient  reports that she quit smoking about 30 years ago. Her smoking use included cigarettes. She has a 45.00 pack-year smoking history. She has never used smokeless tobacco. She reports that she does not drink alcohol and does not use drugs.   Family History: The patient's family history includes Addison's disease in her son; Aortic aneurysm in her father; Breast cancer in her mother; Chiari malformation in her daughter; Colon polyps in her mother; Heart disease in her father; Lung cancer in her mother; Stroke in her father.   ROS:  Please see the history of present illness.  Otherwise, complete review of systems is positive for {NONE DEFAULTED:18576::"none"}.  All other systems are reviewed and negative.   Physical Exam: VS:  There were no vitals taken for this visit., BMI There is no height or weight on file to calculate BMI.  Wt Readings from Last 3 Encounters:  11/11/20 196 lb 3.2 oz (89 kg)  10/30/20 198 lb (89.8 kg)  10/15/20 195 lb 12.8 oz (88.8 kg)    General: Patient appears comfortable at rest. HEENT: Conjunctiva and lids normal, oropharynx clear with moist mucosa. Neck: Supple, no elevated JVP or carotid bruits, no thyromegaly. Lungs: Clear to auscultation, nonlabored breathing at rest. Cardiac: Regular rate and rhythm, no S3 or significant systolic murmur, no pericardial rub. Abdomen: Soft, nontender, no hepatomegaly, bowel sounds present, no guarding or rebound. Extremities: No pitting edema, distal pulses 2+. Skin: Warm and dry. Musculoskeletal: No kyphosis. Neuropsychiatric: Alert and oriented x3, affect grossly appropriate.  ECG:  {EKG/Telemetry Strips Reviewed:[id number]}  Recent Labwork: 03/04/2020: TSH 0.49 10/30/2020: ALT 27; AST 20; BUN 11; Creat 0.82; Potassium 4.1; Sodium 135     Component Value Date/Time   CHOL 172 03/04/2020 1337   CHOL 180 09/28/2019 0910   TRIG 250 (H) 03/04/2020 1337   HDL 43 (L) 03/04/2020 1337   HDL 50 09/28/2019 0910   CHOLHDL 4.0 03/04/2020 1337   LDLCALC 96 03/04/2020 1337    Other Studies Reviewed Today:   Zio patch monitor 10/15/2020 Patch Wear Time:  7 days and 2 hours (2022-02-02T15:09:51-0500 to 2022-02-09T17:54:08-499)  Patient had a min HR of 48 bpm, max HR of 114 bpm, and avg HR of 69 bpm. Predominant underlying rhythm was Sinus Rhythm. 1 run of Supraventricular Tachycardia occurred lasting 4 beats with a max rate of 114 bpm (avg 111 bpm). Isolated SVEs were rare  (<1.0%), SVE Couplets were rare (<1.0%), and SVE Triplets were rare (<1.0%). Isolated VEs were rare (<1.0%), VE Couplets were  rare (<1.0%), and no VE Triplets were present.     08/2016 Nuclear stress test  No diagnostic ST segment changes to indicate ischemia.  No significant myocardial perfusion defects to indicate scar or ischemia.  This is a low risk study.  Nuclear stress EF: 80%.   Assessment and Plan:  No diagnosis found.   Medication Adjustments/Labs and Tests Ordered: Current medicines are reviewed at length with the patient today.  Concerns regarding medicines are outlined above.   Disposition: Follow-up with ***  Signed, Levell July, NP 11/12/2020 9:53 PM    [facility] at [facility]  [place], [place], [place] [address] Phone: [phone]; Fax: [phone]

## 2020-11-13 ENCOUNTER — Ambulatory Visit: Payer: Medicare PPO | Admitting: Family Medicine

## 2020-11-18 ENCOUNTER — Telehealth (INDEPENDENT_AMBULATORY_CARE_PROVIDER_SITE_OTHER): Payer: Self-pay

## 2020-11-19 NOTE — Progress Notes (Signed)
Cardiology Office Note  Date: 11/20/2020   ID: Amanda Webster, DOB 08-03-1961, MRN [id number]  PCP:  [name], MD  Cardiologist:  Carlyle Dolly, MD Electrophysiologist:  None   Chief Complaint: Follow up palpitations / CP / SOB  History of Present Illness: Amanda Webster is a 60 y.o. female with a history of palpitations, chest pains, SOB, OSA not on CPAP, DM2, HTN, HLD.  Last seen by Dr Harl Bowie 10/15/2020. Had recent symptoms of chest pain / pressure, SOB, palpitations. On plavix per neurology. Plan for 7 day Zio patch for evaluation of palpitations.  She is here for follow-up status post recent ZIO monitor.  She had no significant arrhythmias.  Predominant rhythm was sinus.  She had 1 short run of SVT 4 beats.  Otherwise no significant arrhythmias.  She is having some issues with back pain and fluctuating sugars recently.  Currently denies any anginal symptoms.  She has a follow-up pending to have her ventriculoperitoneal shunt checked by neurology.  She has had some dizziness recently believed to be possibly related to issues with her shunt.  She denies any orthostatic symptoms, CVA or TIA-like symptoms, having some right hip pain which he believes is related to her lower back issues.   Past Medical History:  Diagnosis Date   Allergy    Anxiety    Arnold-Chiari malformation ([facility])    Arthritis    Borderline diabetic    Chronic headaches    Colon polyps    nonadenomatous   Depression    Diabetes mellitus without complication ([facility])    Diverticulosis    Esophageal stricture    GERD (gastroesophageal reflux disease)    HTN (hypertension)    Hyperlipidemia    IBS (irritable bowel syndrome)    Seizures ([facility])    pt not sure when her last seizure was- she says she just spaces out- no shaking but she could not tell me last seizure-  on Oxtellar bid for seizures    Sleep apnea    no cpap now- did use 02 with cpap but has not used either in "years"     Past Surgical History:  Procedure Laterality Date   BREAST BIOPSY Left    cahri decompression  6 and 03/1999   CHOLECYSTECTOMY     COLONOSCOPY     invasive cervical traction  02/2009   left breast-lumpectomy     POLYPECTOMY     shunt put in on right side of brain     SHUNT REPLACEMENT  12/10   TOTAL ABDOMINAL HYSTERECTOMY  Age 17 yrs   TAH & BSO   TUBAL LIGATION     UPPER GASTROINTESTINAL ENDOSCOPY      Current Outpatient Medications  Medication Sig Dispense Refill   ACCU-CHEK AVIVA PLUS test strip USE TO TEST BLOOD SUGAR TWICE DAILY 100 strip 3   Accu-Chek Softclix Lancets lancets USE AS DIRECTED TWICE DAILY 100 each 3   AIMOVIG 70 MG/ML SOAJ every 30 (thirty) days.     amLODipine (NORVASC) 5 MG tablet Take 5 mg by mouth daily.     atorvastatin (LIPITOR) 20 MG tablet TK 1 T PO QD 90 tablet 3   azelastine (ASTELIN) 0.1 % nasal spray Place 1 spray into both nostrils daily at 12 noon.     cetirizine (ZYRTEC) 10 MG tablet TK 1 T PO QD 30 tablet 5   clopidogrel (PLAVIX) 75 MG tablet Plavix 75 mg tablet  Take 1 tablet every day by oral  route.     fluticasone (FLONASE) 50 MCG/ACT nasal spray Place 1 spray into both nostrils daily. 16 g 5   gabapentin (NEURONTIN) 300 MG capsule Take 600 mg by mouth at bedtime.     naproxen sodium (ALEVE) 220 MG tablet Take 440 mg by mouth daily as needed (for pain).     Oxcarbazepine (TRILEPTAL) 300 MG tablet Take 300 mg by mouth 2 (two) times daily.     OZEMPIC, 1 MG/DOSE, 4 MG/3ML SOPN ADMINISTER 1 MG UNDER THE SKIN 1 TIME A WEEK 3 mL 3   propranolol (INDERAL) 60 MG tablet TAKE 1 TABLET(60 MG) BY MOUTH TWICE DAILY 180 tablet 1   UNABLE TO FIND Aleve pain relieving lotion-daily     VITAMIN D PO Take 10,000 Units by mouth daily.     No current facility-administered medications for this visit.   Allergies:  Cymbalta [duloxetine hcl], Propoxyphene n-acetaminophen, and Sulfa antibiotics   Social History: The patient   reports that she quit smoking about 30 years ago. Her smoking use included cigarettes. She has a 45.00 pack-year smoking history. She has never used smokeless tobacco. She reports that she does not drink alcohol and does not use drugs.   Family History: The patient's family history includes Addison's disease in her son; Aortic aneurysm in her father; Breast cancer in her mother; Chiari malformation in her daughter; Colon polyps in her mother; Heart disease in her father; Lung cancer in her mother; Stroke in her father.   ROS:  Please see the history of present illness. Otherwise, complete review of systems is positive for none.  All other systems are reviewed and negative.   Physical Exam: VS:  BP 130/80    Pulse 72    Ht 5\' 2"  (1.575 m)    Wt 197 lb (89.4 kg)    SpO2 98%    BMI 36.03 kg/m , BMI Body mass index is 36.03 kg/m.  Wt Readings from Last 3 Encounters:  11/20/20 197 lb (89.4 kg)  11/11/20 196 lb 3.2 oz (89 kg)  10/30/20 198 lb (89.8 kg)    General: Obese patient appears comfortable at rest. Neck: Supple, no elevated JVP or carotid bruits, no thyromegaly. Lungs: Clear to auscultation, nonlabored breathing at rest. Cardiac: Regular rate and rhythm, no S3 or significant systolic murmur, no pericardial rub. Extremities: No pitting edema, distal pulses 2+. Skin: Warm and dry. Musculoskeletal: No kyphosis. Neuropsychiatric: Alert and oriented x3, affect grossly appropriate.  ECG:  EKG October 15, 2020 sinus bradycardia with first-degree AV block rate of 58.  Recent Labwork: 03/04/2020: TSH 0.49 10/30/2020: ALT 27; AST 20; BUN 11; Creat 0.82; Potassium 4.1; Sodium 135     Component Value Date/Time   CHOL 172 03/04/2020 1337   CHOL 180 09/28/2019 0910   TRIG 250 (H) 03/04/2020 1337   HDL 43 (L) 03/04/2020 1337   HDL 50 09/28/2019 0910   CHOLHDL 4.0 03/04/2020 1337   LDLCALC 96 03/04/2020 1337    Other Studies Reviewed Today:   Zio patch monitor 10/15/2020 Patch Wear Time:   7 days and 2 hours (2022-02-02T15:09:51-0500 to 2022-02-09T17:54:08-499)  Patient had a min HR of 48 bpm, max HR of 114 bpm, and avg HR of 69 bpm. Predominant underlying rhythm was Sinus Rhythm. 1 run of Supraventricular Tachycardia occurred lasting 4 beats with a max rate of 114 bpm (avg 111 bpm). Isolated SVEs were rare  (<1.0%), SVE Couplets were rare (<1.0%), and SVE Triplets were rare (<1.0%). Isolated VEs were rare (<  1.0%), VE Couplets were rare (<1.0%), and no VE Triplets were present.     08/2016 Nuclear stress test  No diagnostic ST segment changes to indicate ischemia.  No significant myocardial perfusion defects to indicate scar or ischemia.  This is a low risk study.  Nuclear stress EF: 80%.   Assessment and Plan:  1. Palpitations   2. Other chest pain   3. Shortness of breath   4. Mixed hyperlipidemia    1. Palpitations Denies any current palpitations.  Recent 7-day ZIO monitor showed 1 short episode of SVT of 4 beats.  Otherwise she had no sustained arrhythmias.  2. Other chest pain Currently denies any anginal symptoms.  Not on aspirin.  She is on Plavix per neurology.  3. Shortness of breath Currently denies any shortness of breath.  4. Mixed hyperlipidemia Continue atorvastatin 20 mg daily.  5.  Hypertension Blood pressure well controlled on current therapy.  Blood pressure today 130/80.  Continue amlodipine 5 mg daily.  Continue propranolol 60 mg twice daily.  Medication Adjustments/Labs and Tests Ordered: Current medicines are reviewed at length with the patient today.  Concerns regarding medicines are outlined above.   Disposition: Follow-up with Dr. Harl Bowie or APP 6 months  Signed, Levell July, NP 11/20/2020 2:20 PM    [facility] [place], [place], [place] [address] Phone: [phone]; Fax: [phone]

## 2020-11-19 NOTE — Telephone Encounter (Signed)
Ok, thank you

## 2020-11-20 ENCOUNTER — Ambulatory Visit: Payer: Medicare PPO | Admitting: Family Medicine

## 2020-11-20 ENCOUNTER — Encounter: Payer: Self-pay | Admitting: Family Medicine

## 2020-11-20 VITALS — BP 130/80 | HR 72 | Ht 62.0 in | Wt 197.0 lb

## 2020-11-20 DIAGNOSIS — R0602 Shortness of breath: Secondary | ICD-10-CM

## 2020-11-20 DIAGNOSIS — E782 Mixed hyperlipidemia: Secondary | ICD-10-CM | POA: Diagnosis not present

## 2020-11-20 DIAGNOSIS — R0789 Other chest pain: Secondary | ICD-10-CM

## 2020-11-20 DIAGNOSIS — R002 Palpitations: Secondary | ICD-10-CM | POA: Diagnosis not present

## 2020-11-20 NOTE — Patient Instructions (Addendum)

## 2020-12-01 ENCOUNTER — Ambulatory Visit (INDEPENDENT_AMBULATORY_CARE_PROVIDER_SITE_OTHER): Payer: Medicare PPO | Admitting: Nurse Practitioner

## 2020-12-01 ENCOUNTER — Telehealth (INDEPENDENT_AMBULATORY_CARE_PROVIDER_SITE_OTHER): Payer: Self-pay | Admitting: Nurse Practitioner

## 2020-12-01 ENCOUNTER — Encounter (INDEPENDENT_AMBULATORY_CARE_PROVIDER_SITE_OTHER): Payer: Self-pay | Admitting: Nurse Practitioner

## 2020-12-01 ENCOUNTER — Telehealth (INDEPENDENT_AMBULATORY_CARE_PROVIDER_SITE_OTHER): Payer: Self-pay

## 2020-12-01 ENCOUNTER — Other Ambulatory Visit: Payer: Self-pay

## 2020-12-01 VITALS — BP 126/76 | HR 59 | Temp 97.8°F | Ht 62.25 in | Wt 194.0 lb

## 2020-12-01 DIAGNOSIS — Z1231 Encounter for screening mammogram for malignant neoplasm of breast: Secondary | ICD-10-CM | POA: Diagnosis not present

## 2020-12-01 DIAGNOSIS — Z0000 Encounter for general adult medical examination without abnormal findings: Secondary | ICD-10-CM | POA: Diagnosis not present

## 2020-12-01 NOTE — Progress Notes (Signed)
Subjective:   Amanda Webster is a 60 y.o. female who presents for Medicare Annual (Subsequent) preventive examination.   Cardiac Risk Factors include: diabetes mellitus;dyslipidemia;hypertension;obesity (BMI >30kg/m2)     Objective:    Today's Vitals   12/01/20 0923 12/01/20 0930  BP: 126/76   Pulse: (!) 59   Temp: 97.8 F (36.6 C)   TempSrc: Temporal   SpO2: 97%   Weight: 194 lb (88 kg)   Height: 5' 2.25" (1.581 m)   PainSc:  5    Body mass index is 35.2 kg/m.  Advanced Directives 07/20/2016 07/18/2016  Does Patient Have a Medical Advance Directive? No No    Current Medications (verified) Outpatient Encounter Medications as of 12/01/2020  Medication Sig  . ACCU-CHEK AVIVA PLUS test strip USE TO TEST BLOOD SUGAR TWICE DAILY  . Accu-Chek Softclix Lancets lancets USE AS DIRECTED TWICE DAILY  . AIMOVIG 70 MG/ML SOAJ every 30 (thirty) days.  Marland Kitchen amLODipine (NORVASC) 5 MG tablet Take 5 mg by mouth daily.  Marland Kitchen atorvastatin (LIPITOR) 20 MG tablet TK 1 T PO QD  . azelastine (ASTELIN) 0.1 % nasal spray Place 1 spray into both nostrils daily at 12 noon.  . cetirizine (ZYRTEC) 10 MG tablet TK 1 T PO QD  . clopidogrel (PLAVIX) 75 MG tablet Plavix 75 mg tablet  Take 1 tablet every day by oral route.  . fluticasone (FLONASE) 50 MCG/ACT nasal spray Place 1 spray into both nostrils daily.  Marland Kitchen gabapentin (NEURONTIN) 300 MG capsule Take 600 mg by mouth at bedtime.  . naproxen sodium (ALEVE) 220 MG tablet Take 440 mg by mouth daily as needed (for pain).  . Oxcarbazepine (TRILEPTAL) 300 MG tablet Take 300 mg by mouth 2 (two) times daily.  Marland Kitchen OZEMPIC, 1 MG/DOSE, 4 MG/3ML SOPN ADMINISTER 1 MG UNDER THE SKIN 1 TIME A WEEK  . propranolol (INDERAL) 60 MG tablet TAKE 1 TABLET(60 MG) BY MOUTH TWICE DAILY  . UNABLE TO FIND Aleve pain relieving lotion-daily  . VITAMIN D PO Take 10,000 Units by mouth daily.   No facility-administered encounter medications on file as of 12/01/2020.    Allergies  (verified) Cymbalta [duloxetine hcl], Propoxyphene n-acetaminophen, and Sulfa antibiotics   History: Past Medical History:  Diagnosis Date  . Allergy   . Anxiety   . Arnold-Chiari malformation ([place])   . Arthritis   . Borderline diabetic   . Chronic headaches   . Colon polyps    nonadenomatous  . Depression   . Diabetes mellitus without complication ([place])   . Diverticulosis   . Esophageal stricture   . GERD (gastroesophageal reflux disease)   . HTN (hypertension)   . Hyperlipidemia   . IBS (irritable bowel syndrome)   . Seizures ([place])    pt not sure when her last seizure was- she says she just spaces out- no shaking but she could not tell me last seizure-  on [place] bid for seizures   . Sleep apnea    no cpap now- did use 02 with cpap but has not used either in "years"   Past Surgical History:  Procedure Laterality Date  . BREAST BIOPSY Left   . cahri decompression  8 and 03/1999  . CHOLECYSTECTOMY    . COLONOSCOPY    . invasive cervical traction  02/2009  . left breast-lumpectomy    . POLYPECTOMY    . shunt put in on right side of brain    . SHUNT REPLACEMENT  12/10  . TOTAL ABDOMINAL  HYSTERECTOMY  Age 39 yrs   TAH & BSO  . TUBAL LIGATION    . UPPER GASTROINTESTINAL ENDOSCOPY     Family History  Problem Relation Age of Onset  . Colon polyps Mother   . Breast cancer Mother   . Lung cancer Mother   . Heart disease Father   . Stroke Father   . Aortic aneurysm Father   . Chiari malformation Daughter   . Addison's disease Son   . Colon cancer Neg Hx   . Esophageal cancer Neg Hx   . Rectal cancer Neg Hx   . Stomach cancer Neg Hx    Social History   Socioeconomic History  . Marital status: Married    Spouse name: Not on file  . Number of children: Not on file  . Years of education: Not on file  . Highest education level: Not on file  Occupational History  . Not on file  Tobacco Use  . Smoking status: Former Smoker    Packs/day: 1.50    Years: 30.00     Pack years: 45.00    Types: Cigarettes    Quit date: 09/13/1990    Years since quitting: 30.2  . Smokeless tobacco: Never Used  Vaping Use  . Vaping Use: Never used  Substance and Sexual Activity  . Alcohol use: No  . Drug use: No  . Sexual activity: Not Currently    Birth control/protection: Surgical    Comment: hyst  Other Topics Concern  . Not on file  Social History Narrative   Disabled from Chiari malformation/seizures.. Drinks 4-5 cups of tea daily. Does not get regular exercise. Married for 40 years.   Social Determinants of Health   Financial Resource Strain: Medium Risk  . Difficulty of Paying Living Expenses: Somewhat hard  Food Insecurity: No Food Insecurity  . Worried About Charity fundraiser in the Last Year: Never true  . Ran Out of Food in the Last Year: Never true  Transportation Needs: No Transportation Needs  . Lack of Transportation (Medical): No  . Lack of Transportation (Non-Medical): No  Physical Activity: Sufficiently Active  . Days of Exercise per Week: 7 days  . Minutes of Exercise per Session: 30 min  Stress: No Stress Concern Present  . Feeling of Stress : Not at all  Social Connections: Moderately Isolated  . Frequency of Communication with Friends and Family: More than three times a week  . Frequency of Social Gatherings with Friends and Family: Once a week  . Attends Religious Services: Never  . Active Member of Clubs or Organizations: No  . Attends Archivist Meetings: Never  . Marital Status: Married    Tobacco Counseling Counseling given: Yes   Clinical Intake:  Pre-visit preparation completed: Yes  Pain : 0-10 Pain Score: 5  Pain Type: Chronic pain Pain Location:  (bilteral hands, knees, and head) Pain Descriptors / Indicators: Aching Pain Onset: More than a month ago Pain Frequency: Intermittent Pain Relieving Factors: OTC topical ointment  Pain Relieving Factors: OTC topical ointment  BMI - recorded:  35.2 Nutritional Status: BMI > 30  Obese Nutritional Risks: None Diabetes: Yes CBG done?: No Did pt. bring in CBG monitor from home?: No  How often do you need to have someone help you when you read instructions, pamphlets, or other written materials from your doctor or pharmacy?: 3 - Sometimes What is the last grade level you completed in school?: Some college  Diabetic? yes  Interpreter Needed?:  No  Information entered by :: Jeralyn Ruths, NP-C   Activities of Daily Living In your present state of health, do you have any difficulty performing the following activities: 12/01/2020  Hearing? Y  Vision? Y  Difficulty concentrating or making decisions? Y  Walking or climbing stairs? Y  Dressing or bathing? N  Doing errands, shopping? N  Preparing Food and eating ? Y  Using the Toilet? N  In the past six months, have you accidently leaked urine? Y  Do you have problems with loss of bowel control? N  Managing your Medications? N  Managing your Finances? N  Housekeeping or managing your Housekeeping? N  Some recent data might be hidden    Patient Care Team: [name], MD as PCP - General (Internal Medicine) Harl Bowie Alphonse Guild, MD as PCP - Cardiology (Cardiology)  Indicate any recent Medical Services you may have received from other than [facility] providers in the past year (date may be approximate).     Assessment:   This is a routine wellness examination for [place].  Hearing/Vision screen No exam data present  Dietary issues and exercise activities discussed: Current Exercise Habits: The patient does not participate in regular exercise at present (Will walk outside when the weather is nice), Exercise limited by: neurologic condition(s);orthopedic condition(s)  Goals   None    Depression Screen PHQ 2/9 Scores 12/01/2020 07/09/2020 08/29/2019 08/21/2019 07/20/2016  PHQ - 2 Score 0 1 0 0 0  PHQ- 9 Score 0 8 - - -    Fall Risk Fall Risk  12/01/2020 07/09/2020 01/28/2020  08/29/2019 08/21/2019  Falls in the past year? 0 0 0 0 0  Number falls in past yr: - 0 0 0 0  Injury with Fall? - 0 0 0 0  Risk for fall due to : - - No Fall Risks - -  Follow up - - Falls evaluation completed - -    FALL RISK PREVENTION PERTAINING TO THE HOME:  Any stairs in or around the home? Yes  If so, are there any without handrails? Yes  Home free of loose throw rugs in walkways, pet beds, electrical cords, etc? Yes  Adequate lighting in your home to reduce risk of falls? No   ASSISTIVE DEVICES UTILIZED TO PREVENT FALLS:  Life alert? No  Use of a cane, walker or w/c? Yes  Grab bars in the bathroom? No  Shower chair or bench in shower? No  Elevated toilet seat or a handicapped toilet? Yes   TIMED UP AND GO:  Was the test performed? Yes .  Length of time to ambulate 10 feet: 15 sec.   Gait steady and fast without use of assistive device  Cognitive Function:     6CIT Screen 12/01/2020  What Year? 0 points  What month? 0 points  What time? 0 points  Count back from 20 0 points  Months in reverse 2 points  Repeat phrase 0 points  Total Score 2    Immunizations Immunization History  Administered Date(s) Administered  . Tdap 07/29/2020    TDAP status: Up to date  Flu Vaccine status: Declined, Education has been provided regarding the importance of this vaccine but patient still declined. Advised may receive this vaccine at local pharmacy or Health Dept. Aware to provide a copy of the vaccination record if obtained from local pharmacy or Health Dept. Verbalized acceptance and understanding.   Covid-19 vaccine status: Declined, Education has been provided regarding the importance of this vaccine but  patient still declined. Advised may receive this vaccine at local pharmacy or Health Dept.or vaccine clinic. Aware to provide a copy of the vaccination record if obtained from local pharmacy or Health Dept. Verbalized acceptance and understanding.  Qualifies for  Shingles Vaccine? Yes   Zostavax completed No   Shingrix Completed?: No.    Education has been provided regarding the importance of this vaccine. Patient has been advised to call insurance company to determine out of pocket expense if they have not yet received this vaccine. Advised may also receive vaccine at local pharmacy or Health Dept. Verbalized acceptance and understanding.  Screening Tests Health Maintenance  Topic Date Due  . FOOT EXAM  Never done  . COVID-19 Vaccine (1) Never done  . OPHTHALMOLOGY EXAM  07/14/2020  . INFLUENZA VACCINE  12/11/2020 (Originally 04/13/2020)  . PNEUMOCOCCAL POLYSACCHARIDE VACCINE AGE 32-64 HIGH RISK  06/12/2021 (Originally 01/07/1963)  . HIV Screening  07/29/2021 (Originally 01/07/1978)  . HEMOGLOBIN A1C  04/29/2021  . COLONOSCOPY (Pts 45-59yrs Insurance coverage will need to be confirmed)  08/09/2021  . URINE MICROALBUMIN  10/30/2021  . MAMMOGRAM  11/13/2021  . TETANUS/TDAP  07/29/2030  . Hepatitis C Screening  Completed  . HPV VACCINES  Aged Out  . PAP SMEAR-Modifier  Discontinued    Health Maintenance  Health Maintenance Due  Topic Date Due  . FOOT EXAM  Never done  . COVID-19 Vaccine (1) Never done  . OPHTHALMOLOGY EXAM  07/14/2020    Colorectal cancer screening: Type of screening: Colonoscopy. Completed 07/2018. Repeat every 3 years  Mammogram status: Ordered 12/01/20. Pt provided with contact info and advised to call to schedule appt.    Lung Cancer Screening: (Low Dose CT Chest recommended if Age 81-80 years, 30 pack-year currently smoking OR have quit w/in 15years.) does not qualify.   Lung Cancer Screening Referral: N/A  Additional Screening:  Hepatitis C Screening: does not qualify; Completed 05/2020  Vision Screening: Recommended annual ophthalmology exams for early detection of glaucoma and other disorders of the eye. Is the patient up to date with their annual eye exam?  No  Who is the provider or what is the name of the  office in which the patient attends annual eye exams? [facility] If pt is not established with a provider, would they like to be referred to a provider to establish care? No .   Dental Screening: Recommended annual dental exams for proper oral hygiene  Community Resource Referral / Chronic Care Management: CRR required this visit?  No   CCM required this visit?  No      Plan:    She is encouraged to consider getting the COVID-19 vaccines.  She is also encouraged to consider getting a shingles vaccine.  Will order mammogram for breast cancer screening today.  She was encouraged to call her gastroenterologist this upcoming fall to schedule her repeat of screening colonoscopy.  She tells me she plans on doing this.  Of note, she has been experiencing some headaches and does have history of Arnold-Chiari malformation and does have a shunt in the right side of her brain.  We have referred her back to her neurosurgery group for further evaluation, however there was a delay in the referral due to something happening in the system so that the referral did not complete originally.  However I have been told that this referral has been completed and the neurosurgery group should be contacting the patient shortly to get her scheduled for an appointment.  I have personally reviewed and noted the following in the patient's chart:   . Medical and social history . Use of alcohol, tobacco or illicit drugs  . Current medications and supplements . Functional ability and status . Nutritional status . Physical activity . Advanced directives . List of other physicians . Hospitalizations, surgeries, and ER visits in previous 12 months . Vitals . Screenings to include cognitive, depression, and falls . Referrals and appointments  In addition, I have reviewed and discussed with patient certain preventive protocols, quality metrics, and best practice recommendations. A written personalized care plan for  preventive services as well as general preventive health recommendations were provided to patient.    Patient will follow up as scheduled next month and again in 1 year for annual wellness visit.  Ailene Ards, NP   12/01/2020

## 2020-12-01 NOTE — Telephone Encounter (Signed)
Ordered mammogram (screening), patient is requesting to go to the [facility]. Thank you.

## 2020-12-02 ENCOUNTER — Telehealth (INDEPENDENT_AMBULATORY_CARE_PROVIDER_SITE_OTHER): Payer: Self-pay | Admitting: Nurse Practitioner

## 2020-12-02 NOTE — Telephone Encounter (Signed)
Please print out AVS from 12/01/20 and mail to patient's home. I forgot to provide her with her annual wellness visit instructions when she left yesterday.

## 2020-12-02 NOTE — Telephone Encounter (Signed)
Done

## 2020-12-02 NOTE — Patient Instructions (Signed)
  Ms. Riccardi , Thank you for taking time to come for your Medicare Wellness Visit. I appreciate your ongoing commitment to your health goals. Please review the following plan we discussed and let me know if I can assist you in the future.   These are the goals we discussed: Goals   None     This is a list of the screening recommended for you and due dates:  Health Maintenance  Topic Date Due  . Complete foot exam   Never done  . COVID-19 Vaccine (1) Never done  . Eye exam for diabetics  07/14/2020  . Flu Shot  12/11/2020*  . Pneumococcal vaccine  06/12/2021*  . HIV Screening  07/29/2021*  . Hemoglobin A1C  04/29/2021  . Colon Cancer Screening  08/09/2021  . Urine Protein Check  10/30/2021  . Mammogram  11/13/2021  . Tetanus Vaccine  07/29/2030  .  Hepatitis C: One time screening is recommended by Center for Disease Control  (CDC) for  adults born from [birth year] through 1963.   Completed  . HPV Vaccine  Aged Out  . Pap Smear  Discontinued  *Topic was postponed. The date shown is not the original due date.

## 2020-12-03 NOTE — Telephone Encounter (Signed)
Pt has now been set for a appt for the for mammogram in may 2022. Pt has mychart and will see notice soon.

## 2020-12-03 NOTE — Telephone Encounter (Signed)
Appt made; Pt will see in mychart.

## 2020-12-04 NOTE — Telephone Encounter (Signed)
Please try to give her a call to notify her of the appointment, she may not check mychart regularly.

## 2020-12-09 ENCOUNTER — Ambulatory Visit (HOSPITAL_COMMUNITY): Payer: Medicare PPO | Attending: Physician Assistant

## 2020-12-14 ENCOUNTER — Other Ambulatory Visit (INDEPENDENT_AMBULATORY_CARE_PROVIDER_SITE_OTHER): Payer: Self-pay | Admitting: Internal Medicine

## 2020-12-15 NOTE — Telephone Encounter (Signed)
Pt was called & given appt and location.

## 2020-12-22 ENCOUNTER — Telehealth (INDEPENDENT_AMBULATORY_CARE_PROVIDER_SITE_OTHER): Payer: Self-pay

## 2020-12-22 ENCOUNTER — Encounter (INDEPENDENT_AMBULATORY_CARE_PROVIDER_SITE_OTHER): Payer: Self-pay

## 2020-12-22 NOTE — Telephone Encounter (Signed)
Okay, thank you for communicating with the patient.

## 2020-12-22 NOTE — Telephone Encounter (Signed)
Given her [facility] number to call and get her appt scheduled. They ave to get it approved and setup correctly in w-s. They are the only one who can do images's. Order was completed on 10/30/20 as urgent . Pt should have been ready to have ASAP. But pt still has not heard from anyone at [facility]. I ask patient to recall her provider office and talk to a nurse & scheduler to help resolve the problem.

## 2020-12-30 ENCOUNTER — Telehealth (INDEPENDENT_AMBULATORY_CARE_PROVIDER_SITE_OTHER): Payer: Self-pay

## 2020-12-30 ENCOUNTER — Ambulatory Visit (INDEPENDENT_AMBULATORY_CARE_PROVIDER_SITE_OTHER): Payer: Medicare PPO | Admitting: Internal Medicine

## 2020-12-30 NOTE — Telephone Encounter (Signed)
I think she needs to go through her neurologist's office for the orders because they will need to be the ones to manage whatever is found on imaging, as I have no experience treating her condition and I feel it would be too far outside of my scope. If Dr. Anastasio Champion feels comfortable placing the orders he can, but I would think her neurologist's office should be able to order the imaging to be done at [facility] if that is what the patient requests.

## 2020-12-30 NOTE — Telephone Encounter (Signed)
Okay, keep me posted.

## 2020-12-30 NOTE — Telephone Encounter (Signed)
So I am waiting to see where Dr Prince Rome note will come over with directions to process this information to correct place. Based on you note you just sent me . TBA

## 2020-12-31 ENCOUNTER — Telehealth (INDEPENDENT_AMBULATORY_CARE_PROVIDER_SITE_OTHER): Payer: Self-pay

## 2020-12-31 ENCOUNTER — Encounter (INDEPENDENT_AMBULATORY_CARE_PROVIDER_SITE_OTHER): Payer: Self-pay | Admitting: Nurse Practitioner

## 2020-12-31 NOTE — Telephone Encounter (Signed)
Just as FYI, I did discuss the situation with Dr. Anastasio Champion. He too feels the neurology group needs to be the ones to complete the imaging test as they are more familiar with her specific situation and needs related to her shunt and medical history.

## 2020-12-31 NOTE — Telephone Encounter (Signed)
Return call to patient on the instruction per Dr Anastasio Champion & [place]

## 2020-12-31 NOTE — Telephone Encounter (Signed)
Called Dr Prince Rome office to f/u on the message patient stated provider told her to do. But we have no notes or order to back up request to do images here from NP that she stated on the call from yesterday. LVM to nurse line at [phone] to return call and follow notes.

## 2020-12-31 NOTE — Telephone Encounter (Signed)
Faxed over [place], letter to Dr. Prince Rome office for review & to schedule visit ASAP. Will be on look out for appt and return fax/ call.

## 2021-01-26 ENCOUNTER — Other Ambulatory Visit: Payer: Self-pay

## 2021-01-26 ENCOUNTER — Ambulatory Visit
Admission: RE | Admit: 2021-01-26 | Discharge: 2021-01-26 | Disposition: A | Discharge status: Home / Self Care | Payer: Medicare PPO | Source: Ambulatory Visit | Attending: Nurse Practitioner | Admitting: Nurse Practitioner

## 2021-01-26 ENCOUNTER — Ambulatory Visit: Payer: Medicare PPO

## 2021-01-26 DIAGNOSIS — Z1231 Encounter for screening mammogram for malignant neoplasm of breast: Secondary | ICD-10-CM

## 2021-01-27 ENCOUNTER — Ambulatory Visit (INDEPENDENT_AMBULATORY_CARE_PROVIDER_SITE_OTHER): Payer: Medicare PPO | Admitting: Internal Medicine

## 2021-01-27 ENCOUNTER — Encounter (INDEPENDENT_AMBULATORY_CARE_PROVIDER_SITE_OTHER): Payer: Self-pay | Admitting: Internal Medicine

## 2021-01-27 VITALS — BP 128/71 | HR 60 | Temp 97.3°F | Resp 18 | Ht 62.0 in | Wt 193.8 lb

## 2021-01-27 DIAGNOSIS — E08618 Diabetes mellitus due to underlying condition with other diabetic arthropathy: Secondary | ICD-10-CM | POA: Diagnosis not present

## 2021-01-27 DIAGNOSIS — E0865 Diabetes mellitus due to underlying condition with hyperglycemia: Secondary | ICD-10-CM | POA: Diagnosis not present

## 2021-01-27 DIAGNOSIS — Z794 Long term (current) use of insulin: Secondary | ICD-10-CM | POA: Diagnosis not present

## 2021-01-27 DIAGNOSIS — E1169 Type 2 diabetes mellitus with other specified complication: Secondary | ICD-10-CM | POA: Diagnosis not present

## 2021-01-27 DIAGNOSIS — E782 Mixed hyperlipidemia: Secondary | ICD-10-CM

## 2021-01-27 DIAGNOSIS — I10 Essential (primary) hypertension: Secondary | ICD-10-CM | POA: Diagnosis not present

## 2021-01-27 DIAGNOSIS — IMO0002 Reserved for concepts with insufficient information to code with codable children: Secondary | ICD-10-CM

## 2021-01-27 DIAGNOSIS — E785 Hyperlipidemia, unspecified: Secondary | ICD-10-CM

## 2021-01-27 MED ORDER — ATORVASTATIN CALCIUM 20 MG PO TABS
20.0000 mg | ORAL_TABLET | Freq: Every day | ORAL | 1 refills | Status: DC
Start: 2021-01-27 — End: 2021-05-07

## 2021-01-27 NOTE — Progress Notes (Signed)
Metrics: Intervention Frequency ACO  Documented Smoking Status Yearly  Screened one or more times in 24 months  Cessation Counseling or  Active cessation medication Past 24 months  Past 24 months   Guideline developer: UpToDate (See UpToDate for funding source) Date Released: 2014       Wellness Office Visit  Subjective:  Patient ID: MIHO MONDA, female    DOB: 02-Jun-1961  Age: 60 y.o. MRN: [id number]  CC: This lady comes in for follow-up of her hypertension, diabetes, dyslipidemia. HPI  She did connect with her neurosurgery practice regarding the [name] Chiari malformation and shunt they are all.  According to the neurosurgeons notes, no further surgery is required.  The balance issue that she is having is now improving and is more intermittent rather than more persistent.  She is willing to see if it will improve completely.  She already does see Dr. Merlene Laughter, neurology and I have recommended that she go back and see him if things do not improve. As far as her diabetes is concerned, she continues to take Ozempic and her last hemoglobin A1c was 7%. She continues on statin therapy in the face of diabetes also. She continues on amlodipine and Inderal for hypertension. She denies any chest pain, dyspnea, palpitations or limb weakness. She is having some difficulty affording all medications that she is on. She is happy with Aimovig injections which have significantly helped her migraine headaches. Past Medical History:  Diagnosis Date  . Allergy   . Anxiety   . Arnold-Chiari malformation ([place])   . Arthritis   . Borderline diabetic   . Chronic headaches   . Colon polyps    nonadenomatous  . Depression   . Diabetes mellitus without complication ([place])   . Diverticulosis   . Esophageal stricture   . GERD (gastroesophageal reflux disease)   . HTN (hypertension)   . Hyperlipidemia   . IBS (irritable bowel syndrome)   . Seizures ([place])    pt not sure when her last seizure was-  she says she just spaces out- no shaking but she could not tell me last seizure-  on Oxtellar bid for seizures   . Sleep apnea    no cpap now- did use 02 with cpap but has not used either in "years"   Past Surgical History:  Procedure Laterality Date  . BREAST BIOPSY Left   . cahri decompression  8 and 03/1999  . CHOLECYSTECTOMY    . COLONOSCOPY    . invasive cervical traction  02/2009  . left breast-lumpectomy    . POLYPECTOMY    . shunt put in on right side of brain    . SHUNT REPLACEMENT  12/10  . TOTAL ABDOMINAL HYSTERECTOMY  Age 35 yrs   TAH & BSO  . TUBAL LIGATION    . UPPER GASTROINTESTINAL ENDOSCOPY       Family History  Problem Relation Age of Onset  . Colon polyps Mother   . Breast cancer Mother   . Lung cancer Mother   . Heart disease Father   . Stroke Father   . Aortic aneurysm Father   . Chiari malformation Daughter   . Addison's disease Son   . Breast cancer Maternal Aunt   . Colon cancer Neg Hx   . Esophageal cancer Neg Hx   . Rectal cancer Neg Hx   . Stomach cancer Neg Hx     Social History   Social History Narrative   Disabled from Chiari malformation/seizures.. Drinks 4-5  cups of tea daily. Does not get regular exercise. Married for 40 years.   Social History   Tobacco Use  . Smoking status: Former Smoker    Packs/day: 1.50    Years: 30.00    Pack years: 45.00    Types: Cigarettes    Quit date: 09/13/1990    Years since quitting: 30.3  . Smokeless tobacco: Never Used  Substance Use Topics  . Alcohol use: No    Current Meds  Medication Sig  . ACCU-CHEK AVIVA PLUS test strip USE TO TEST BLOOD SUGAR TWICE DAILY  . Accu-Chek Softclix Lancets lancets USE AS DIRECTED TWICE DAILY  . AIMOVIG 70 MG/ML SOAJ every 30 (thirty) days.  Marland Kitchen amLODipine (NORVASC) 5 MG tablet Take 5 mg by mouth daily.  Marland Kitchen azelastine (ASTELIN) 0.1 % nasal spray Place 1 spray into both nostrils daily at 12 noon.  . cetirizine (ZYRTEC) 10 MG tablet TAKE 1 TABLET BY MOUTH EVERY  DAY  . clopidogrel (PLAVIX) 75 MG tablet Plavix 75 mg tablet  Take 1 tablet every day by oral route.  . fluticasone (FLONASE) 50 MCG/ACT nasal spray Place 1 spray into both nostrils daily.  Marland Kitchen gabapentin (NEURONTIN) 300 MG capsule Take 600 mg by mouth at bedtime.  . naproxen sodium (ALEVE) 220 MG tablet Take 440 mg by mouth daily as needed (for pain).  . Oxcarbazepine (TRILEPTAL) 300 MG tablet Take 300 mg by mouth 2 (two) times daily.  Marland Kitchen OZEMPIC, 1 MG/DOSE, 4 MG/3ML SOPN ADMINISTER 1 MG UNDER THE SKIN 1 TIME A WEEK  . propranolol (INDERAL) 60 MG tablet TAKE 1 TABLET(60 MG) BY MOUTH TWICE DAILY  . UNABLE TO FIND Aleve pain relieving lotion-daily  . VITAMIN D PO Take 10,000 Units by mouth daily.  . [DISCONTINUED] atorvastatin (LIPITOR) 20 MG tablet TK 1 T PO QD     Flowsheet Row Office Visit from 12/01/2020 in [facility]  PHQ-9 Total Score 0      Objective:   Today's Vitals: BP 128/71 (BP Location: Right Arm, Patient Position: Sitting, Cuff Size: Normal)   Pulse 60   Temp (!) 97.3 F (36.3 C) (Temporal)   Resp 18   Ht 5\' 2"  (1.575 m)   Wt 193 lb 12.8 oz (87.9 kg)   SpO2 98%   BMI 35.45 kg/m  Vitals with BMI 01/27/2021 12/01/2020 11/20/2020  Height 5\' 2"  5' 2.25" 5\' 2"   Weight 193 lbs 13 oz 194 lbs 197 lbs  BMI 35.44 13.24 40.10  Systolic 272 536 644  Diastolic 71 76 80  Pulse 60 59 72     Physical Exam  She remains morbidly obese.  Blood pressure is in a good range.  She is alert and orientated without any focal logical signs.     Assessment   1. Uncontrolled diabetes mellitus due to underlying condition with diabetic arthropathy, with long-term current use of insulin ([place])   2. Hyperlipidemia associated with type 2 diabetes mellitus ([place])   3. Essential hypertension       Tests ordered No orders of the defined types were placed in this encounter.    Plan: 1. She will continue with Ozempic and focus on nutrition more.  We discussed the concept of  intermittent fasting, good hydration and avoiding sweet tea which she tends to have on a daily basis.  I have given her diet sheet. 2. We also discussed sleep as she does not get uninterrupted sleep.  We discussed the use of melatonin.  She may  try this. 3. Continue with antihypertensive medications as before, blood pressure is well controlled. 4. Continue with statin therapy as before. 5. Follow-up with Judson Roch in about 3 months when I would recommend blood work be done again.   Meds ordered this encounter  Medications  . atorvastatin (LIPITOR) 20 MG tablet    Sig: Take 1 tablet (20 mg total) by mouth daily. TK 1 T PO QD    Dispense:  90 tablet    Refill:  1    Maurianna Benard Luther Parody, MD

## 2021-01-27 NOTE — Patient Instructions (Signed)
[facility] Dietary Recommendations for Weight Loss What to Avoid . Avoid added sugars o Often added sugar can be found in processed foods such as many condiments, dry cereals, cakes, cookies, chips, crisps, crackers, candies, sweetened drinks, etc.  o Read labels and AVOID/DECREASE use of foods with the following in their ingredient list: Sugar, fructose, high fructose corn syrup, sucrose, glucose, maltose, dextrose, molasses, cane sugar, brown sugar, any type of syrup, agave nectar, etc.   . Avoid snacking in between meals . Avoid foods made with flour o If you are going to eat food made with flour, choose those made with whole-grains; and, minimize your consumption as much as is tolerable . Avoid processed foods o These foods are generally stocked in the middle of the grocery store. Focus on shopping on the perimeter of the grocery.  . Avoid Meat  o We recommend following a plant-based diet at [facility]. Thus, we recommend avoiding meat as a general rule. Consider eating beans, legumes, eggs, and/or dairy products for regular protein sources o If you plan on eating meat limit to 4 ounces of meat at a time and choose lean options such as Fish, chicken, turkey. Avoid red meat intake such as pork and/or steak What to Include . Vegetables o GREEN LEAFY VEGETABLES: Kale, spinach, mustard greens, collard greens, cabbage, broccoli, etc. o OTHER: Asparagus, cauliflower, eggplant, carrots, peas, Brussel sprouts, tomatoes, bell peppers, zucchini, beets, cucumbers, etc. . Grains, seeds, and legumes o Beans: kidney beans, black eyed peas, garbanzo beans, black beans, pinto beans, etc. o Whole, unrefined grains: brown rice, barley, bulgur, oatmeal, etc. . Healthy fats  o Avoid highly processed fats such as vegetable oil o Examples of healthy fats: avocado, olives, virgin olive oil, dark chocolate (?72% Cocoa), nuts (peanuts, almonds, walnuts, cashews, pecans, etc.) . None to Low  Intake of Animal Sources of Protein o Meat sources: chicken, turkey, salmon, tuna. Limit to 4 ounces of meat at one time. o Consider limiting dairy sources, but when choosing dairy focus on: PLAIN Greek yogurt, cottage cheese, high-protein milk . Fruit o Choose berries  When to Eat . Intermittent Fasting: o Choosing not to eat for a specific time period, but DO FOCUS ON HYDRATION when fasting o Multiple Techniques: - Time Restricted Eating: eat 3 meals in a day, each meal lasting no more than 60 minutes, no snacks between meals - 16-18 hour fast: fast for 16 to 18 hours up to 7 days a week. Often suggested to start with 2-3 nonconsecutive days per week.  . Remember the time you sleep is counted as fasting.  . Examples of eating schedule: Fast from 7:00pm-11:00am. Eat between 11:00am-7:00pm.  - 24-hour fast: fast for 24 hours up to every other day. Often suggested to start with 1 day per week . Remember the time you sleep is counted as fasting . Examples of eating schedule:  o Eating day: eat 2-3 meals on your eating day. If doing 2 meals, each meal should last no more than 90 minutes. If doing 3 meals, each meal should last no more than 60 minutes. Finish last meal by 7:00pm. o Fasting day: Fast until 7:00pm.  o IF YOU FEEL UNWELL FOR ANY REASON/IN ANY WAY WHEN FASTING, STOP FASTING BY EATING A NUTRITIOUS SNACK OR LIGHT MEAL o ALWAYS FOCUS ON HYDRATION DURING FASTS - Acceptable Hydration sources: water, broths, tea/coffee (black tea/coffee is best but using a small amount of whole-fat dairy products in coffee/tea is acceptable).  -   Poor Hydration Sources: anything with sugar or artificial sweeteners added to it  These recommendations have been developed for patients that are actively receiving medical care from either Dr. Shila Kruczek or Sarah Gray, DNP, NP-C at [facility]. These recommendations are developed for patients with specific medical conditions and are not meant to be  distributed or used by others that are not actively receiving care from either provider listed above at [facility]. It is not appropriate to participate in the above eating plans without proper medical supervision.   Reference: Fung, J. The obesity code. [place]: [place]; 2016.   

## 2021-01-27 NOTE — Progress Notes (Signed)
Had appt to do covid vaccine. Her miss to get a text appt & she was confused the day to go. Not a good time.  3 wks ago she had a real battle with weakness & fatigue. Now not her best but not sure why. REferral from [facility] for images w/   Dr.Power -office appt was virtual . Images show a crack in shunt. Ask to change but seems  They don't want to do anything about she stated.  Hard to afford meds now with all that I going on . Willing to go to adhere pharmacy to help with cost and would like to start today.

## 2021-01-28 NOTE — Telephone Encounter (Signed)
Pt sis stated on LOV she did have a virtual appt with Dr Prince Rome NP in office. There is a crack in her shunt in head. But not sure if she  will have surgery yet or any changes at this time.

## 2021-03-05 ENCOUNTER — Other Ambulatory Visit (INDEPENDENT_AMBULATORY_CARE_PROVIDER_SITE_OTHER): Payer: Self-pay | Admitting: Internal Medicine

## 2021-03-25 ENCOUNTER — Other Ambulatory Visit: Payer: Self-pay

## 2021-03-25 ENCOUNTER — Telehealth (INDEPENDENT_AMBULATORY_CARE_PROVIDER_SITE_OTHER): Payer: Self-pay

## 2021-03-25 ENCOUNTER — Emergency Department (HOSPITAL_COMMUNITY): Payer: Medicare PPO

## 2021-03-25 ENCOUNTER — Encounter (HOSPITAL_COMMUNITY): Payer: Self-pay | Admitting: *Deleted

## 2021-03-25 ENCOUNTER — Emergency Department (HOSPITAL_COMMUNITY)
Admission: EM | Admit: 2021-03-25 | Discharge: 2021-03-25 | Disposition: A | Discharge status: Home / Self Care | Payer: Medicare PPO | Attending: Emergency Medicine | Admitting: Emergency Medicine

## 2021-03-25 DIAGNOSIS — R200 Anesthesia of skin: Secondary | ICD-10-CM | POA: Diagnosis not present

## 2021-03-25 DIAGNOSIS — Z87891 Personal history of nicotine dependence: Secondary | ICD-10-CM | POA: Insufficient documentation

## 2021-03-25 DIAGNOSIS — R519 Headache, unspecified: Secondary | ICD-10-CM | POA: Insufficient documentation

## 2021-03-25 DIAGNOSIS — E785 Hyperlipidemia, unspecified: Secondary | ICD-10-CM | POA: Diagnosis not present

## 2021-03-25 DIAGNOSIS — Z79899 Other long term (current) drug therapy: Secondary | ICD-10-CM | POA: Diagnosis not present

## 2021-03-25 DIAGNOSIS — Z20822 Contact with and (suspected) exposure to covid-19: Secondary | ICD-10-CM | POA: Diagnosis not present

## 2021-03-25 DIAGNOSIS — I10 Essential (primary) hypertension: Secondary | ICD-10-CM | POA: Insufficient documentation

## 2021-03-25 DIAGNOSIS — E1169 Type 2 diabetes mellitus with other specified complication: Secondary | ICD-10-CM | POA: Diagnosis not present

## 2021-03-25 DIAGNOSIS — Z7902 Long term (current) use of antithrombotics/antiplatelets: Secondary | ICD-10-CM | POA: Diagnosis not present

## 2021-03-25 DIAGNOSIS — Z794 Long term (current) use of insulin: Secondary | ICD-10-CM | POA: Insufficient documentation

## 2021-03-25 LAB — RESP PANEL BY RT-PCR (FLU A&B, COVID) ARPGX2
Influenza A by PCR: NEGATIVE
Influenza B by PCR: NEGATIVE
SARS Coronavirus 2 by RT PCR: NEGATIVE

## 2021-03-25 LAB — CBC
HCT: 42.3 % (ref 36.0–46.0)
Hemoglobin: 14.8 g/dL (ref 12.0–15.0)
MCH: 31.7 pg (ref 26.0–34.0)
MCHC: 35 g/dL (ref 30.0–36.0)
MCV: 90.6 fL (ref 80.0–100.0)
Platelets: 168 10*3/uL (ref 150–400)
RBC: 4.67 MIL/uL (ref 3.87–5.11)
RDW: 12.6 % (ref 11.5–15.5)
WBC: 6.6 10*3/uL (ref 4.0–10.5)
nRBC: 0 % (ref 0.0–0.2)

## 2021-03-25 LAB — BASIC METABOLIC PANEL
Anion gap: 8 (ref 5–15)
BUN: 13 mg/dL (ref 6–20)
CO2: 30 mmol/L (ref 22–32)
Calcium: 9.6 mg/dL (ref 8.9–10.3)
Chloride: 98 mmol/L (ref 98–111)
Creatinine, Ser: 0.88 mg/dL (ref 0.44–1.00)
GFR, Estimated: >60 mL/min (ref >60)
Glucose, Bld: 151 mg/dL — ABNORMAL HIGH (ref 70–99)
Potassium: 4 mmol/L (ref 3.5–5.1)
Sodium: 136 mmol/L (ref 135–145)

## 2021-03-25 LAB — TROPONIN I (HIGH SENSITIVITY)
Troponin I (High Sensitivity): 3 ng/L (ref <18)
Troponin I (High Sensitivity): 3 ng/L (ref <18)

## 2021-03-25 LAB — D-DIMER, QUANTITATIVE: D-Dimer, Quant: <0.27 ug/mL-FEU (ref 0.00–0.50)

## 2021-03-25 MED ORDER — PROCHLORPERAZINE EDISYLATE 10 MG/2ML IJ SOLN
10.0000 mg | Freq: Once | INTRAMUSCULAR | Status: AC
  Administered 2021-03-25: 10 mg via INTRAVENOUS
  Filled 2021-03-25: qty 2

## 2021-03-25 MED ORDER — DIPHENHYDRAMINE HCL 50 MG/ML IJ SOLN
25.0000 mg | Freq: Once | INTRAMUSCULAR | Status: AC
  Administered 2021-03-25: 25 mg via INTRAVENOUS
  Filled 2021-03-25: qty 1

## 2021-03-25 NOTE — Telephone Encounter (Signed)
Patient called and left a detailed voice message that she was having a flare up and not feeling right.  I called the patient back and she stated that she has not felt well in 4 to 5 days and has no energy to do anything. Patient stated that she is having arm numbness and weakness in both arms and right are is worse than left arm. Patient stated that she is also having SOB, chest pain and chest pressure and she is also having pressure in the back base of her head and feels full. Patient finished her Plavix yesterday and stated that she is taking 2 baby aspirin 81mg  today as she is not able to afford the Plavix anymore. Patient stated that she is feeling really bad and can hardly move.  I advised patient to call 911 and she stated that she wanted to have someone take her to ER but if they are not able to she will call 911 to come take her. Patient verbalized an understanding.  Sending as Juluis Rainier.

## 2021-03-25 NOTE — ED Notes (Signed)
Patient ambulated in hallway with no assistance. o2 sat 100%

## 2021-03-25 NOTE — ED Notes (Signed)
Patient transported to X-ray 

## 2021-03-25 NOTE — ED Notes (Signed)
Patient transported to CT 

## 2021-03-25 NOTE — ED Provider Notes (Signed)
[facility] EMERGENCY DEPARTMENT Provider Note   CSN: [id number] Arrival date & time: 03/25/21  1254     History Chief Complaint  Patient presents with   Headache    Amanda Webster is a 60 y.o. female.  HPI  60 year old female with a history of allergies, anxiety, Arnold Chiari malformation s/p decompression with subsequent VP shunt, arthritis, borderline diabetes, chronic headaches, polyps, depression, diverticulosis, esophageal stricture, GERD, hypertension, hyperlipidemia, IBS, seizures, sleep apnea, who presents to the emergency department today for evaluation of multiple complaints including headache and chest pain.  Headache: Patient reports that she has had a pressure in the back of her head for 1 week.  The symptoms are worse when she lays on her back.  She rates pain 7-04/2009.  States that Aleve and ice packs have helped her symptoms.  She reports associated numbness/weakness of the bilateral upper extremities.  Denies any changes in lower extremity sensation or strength.  Denies any new visual changes or other associated neurologic complaints.  Chest pain: Patient complaining of mid, nonradiating 4-01/2009 chest pain that has been intermittent over the last week.  Episodes last for few minutes at a time and are described as it pressure/shooting pain.  Symptoms occur randomly and are not associated with exertion.  She does report increased indigestion and burping.  She reports chronic and unchanged nausea.  She reports shortness of breath on exertion and increased fatigue.  Denies cough, pleuritic pain, bilateral lower extremity swelling, calf pain, hemoptysis, recent surgeries, hospital admissions, extended periods of travel or periods of prolonged immobilization.  Is not on hormone therapy.  Has no history of cancer or blood clots.  Denies any recent URI symptoms.  Past Medical History:  Diagnosis Date   Allergy    Anxiety    Arnold-Chiari malformation (HCC)    Arthritis     Borderline diabetic    Chronic headaches    Colon polyps    nonadenomatous   Depression    Diabetes mellitus without complication ([place])    Diverticulosis    Esophageal stricture    GERD (gastroesophageal reflux disease)    HTN (hypertension)    Hyperlipidemia    IBS (irritable bowel syndrome)    Seizures ([place])    pt not sure when her last seizure was- she says she just spaces out- no shaking but she could not tell me last seizure-  on Oxtellar bid for seizures    Sleep apnea    no cpap now- did use 02 with cpap but has not used either in "years"    Patient Active Problem List   Diagnosis Date Noted   History of Chiari malformation 08/12/2020   Primary osteoarthritis of both hands 08/05/2020   Primary osteoarthritis of both knees 08/05/2020   DDD (degenerative disc disease), lumbar 08/05/2020   Encounter for screening fecal occult blood testing 07/09/2020   Visit for pelvic exam 07/09/2020   Mixed stress and urge urinary incontinence 07/09/2020   Hyperlipidemia associated with type 2 diabetes mellitus ([place]) 11/22/2019   Seborrheic keratoses 11/22/2019   Depression, recurrent ([place]) 10/05/2019   Essential hypertension 08/27/2019   Uncontrolled diabetes mellitus due to underlying condition with diabetic arthropathy, with long-term current use of insulin ([place]) 08/27/2019   Chronic intractable headache 08/27/2019   Chest pain at rest 03/08/2011   Obesity 03/08/2011   FROZEN LEFT SHOULDER 04/01/2010   Sleep apnea 04/01/2010   DIARRHEA, PERSISTENT 05/05/2009   ABDOMINAL PAIN, RUQ 05/05/2009    Past Surgical History:  Procedure Laterality Date   BREAST BIOPSY Left    cahri decompression  6 and 03/1999   CHOLECYSTECTOMY     COLONOSCOPY     invasive cervical traction  02/2009   left breast-lumpectomy     POLYPECTOMY     shunt put in on right side of brain     SHUNT REPLACEMENT  12/10   TOTAL ABDOMINAL HYSTERECTOMY  Age 65 yrs   TAH & BSO   TUBAL LIGATION     UPPER  GASTROINTESTINAL ENDOSCOPY       OB History     Gravida  3   Para  3   Term  3   Preterm      AB      Living  3      SAB      IAB      Ectopic      Multiple      Live Births  3           Family History  Problem Relation Age of Onset   Colon polyps Mother    Breast cancer Mother    Lung cancer Mother    Heart disease Father    Stroke Father    Aortic aneurysm Father    Chiari malformation Daughter    Addison's disease Son    Breast cancer Maternal Aunt    Colon cancer Neg Hx    Esophageal cancer Neg Hx    Rectal cancer Neg Hx    Stomach cancer Neg Hx     Social History   Tobacco Use   Smoking status: Former    Packs/day: 1.50    Years: 30.00    Pack years: 45.00    Types: Cigarettes    Quit date: 09/13/1990    Years since quitting: 30.5   Smokeless tobacco: Never  Vaping Use   Vaping Use: Never used  Substance Use Topics   Alcohol use: No   Drug use: No    Home Medications Prior to Admission medications   Medication Sig Start Date End Date Taking? Authorizing Provider  ACCU-CHEK AVIVA PLUS test strip USE TO TEST BLOOD SUGAR TWICE DAILY 09/24/20   Hurshel Party C, MD  Accu-Chek Softclix Lancets lancets USE AS DIRECTED TWICE DAILY 07/22/20   Anastasio Champion, Nimish C, MD  AIMOVIG 70 MG/ML SOAJ every 30 (thirty) days. 06/16/20   [provider]  amLODipine (NORVASC) 5 MG tablet Take 5 mg by mouth daily.    [provider]  atorvastatin (LIPITOR) 20 MG tablet Take 1 tablet (20 mg total) by mouth daily. TK 1 T PO QD 01/27/21   Gosrani, Nimish C, MD  azelastine (ASTELIN) 0.1 % nasal spray Place 1 spray into both nostrils daily at 12 noon. 07/26/20   [provider]  cetirizine (ZYRTEC) 10 MG tablet TAKE 1 TABLET BY MOUTH EVERY DAY 12/15/20   Hurshel Party C, MD  clopidogrel (PLAVIX) 75 MG tablet Plavix 75 mg tablet  Take 1 tablet every day by oral route.    [provider]  fluticasone (FLONASE) 50 MCG/ACT nasal spray  Place 1 spray into both nostrils daily. 11/22/19   Corum, Rex Kras, MD  gabapentin (NEURONTIN) 300 MG capsule Take 600 mg by mouth at bedtime.    [provider]  naproxen sodium (ALEVE) 220 MG tablet Take 440 mg by mouth daily as needed (for pain).    [provider]  Oxcarbazepine (TRILEPTAL) 300 MG tablet Take 300 mg by mouth  2 (two) times daily.    [provider]  OZEMPIC, 1 MG/DOSE, 4 MG/3ML SOPN ADMINISTER 1 MG UNDER THE SKIN 1 TIME A WEEK 03/05/21   Ailene Ards, NP  propranolol (INDERAL) 60 MG tablet TAKE 1 TABLET(60 MG) BY MOUTH TWICE DAILY 09/23/20   [name], MD  UNABLE TO FIND Aleve pain relieving lotion-daily    [provider]  VITAMIN D PO Take 10,000 Units by mouth daily.    [provider]    Allergies    Cymbalta [duloxetine hcl], Propoxyphene n-acetaminophen, and Sulfa antibiotics  Review of Systems   Review of Systems  Constitutional:  Negative for chills and fever.  HENT:  Negative for ear pain and sore throat.   Eyes:  Negative for visual disturbance.  Respiratory:  Positive for shortness of breath. Negative for cough.   Cardiovascular:  Positive for chest pain. Negative for leg swelling.  Gastrointestinal:  Negative for abdominal pain, constipation, diarrhea, nausea and vomiting.  Genitourinary:  Negative for dysuria and hematuria.  Musculoskeletal:  Negative for back pain.  Skin:  Negative for rash.  Neurological:  Positive for weakness, numbness and headaches. Negative for dizziness and light-headedness.  All other systems reviewed and are negative.  Physical Exam Updated Vital Signs BP (!) 106/41 (BP Location: Right Arm)   Pulse (!) 58   Temp 97.7 F (36.5 C) (Oral)   Resp 14   SpO2 92%   Physical Exam Vitals and nursing note reviewed.  Constitutional:      General: She is not in acute distress.    Appearance: She is well-developed. She is not ill-appearing or toxic-appearing.  HENT:     Head:  Normocephalic and atraumatic.  Eyes:     Extraocular Movements: Extraocular movements intact.     Right eye: No nystagmus.     Left eye: No nystagmus.     Conjunctiva/sclera: Conjunctivae normal.     Pupils: Pupils are equal, round, and reactive to light.  Cardiovascular:     Rate and Rhythm: Normal rate and regular rhythm.     Heart sounds: Normal heart sounds. No murmur heard. Pulmonary:     Effort: Pulmonary effort is normal. No respiratory distress.     Breath sounds: Normal breath sounds. No wheezing, rhonchi or rales.  Abdominal:     General: Bowel sounds are normal.     Palpations: Abdomen is soft.     Tenderness: There is no abdominal tenderness. There is no guarding.  Musculoskeletal:     Cervical back: Neck supple.  Skin:    General: Skin is warm and dry.  Neurological:     Mental Status: She is alert.     Comments: Mental Status:  Alert, thought content appropriate, able to give a coherent history. Speech fluent without evidence of aphasia. Able to follow 2 step commands without difficulty.  Cranial Nerves:  II:  pupils equal, round, reactive to light III,IV, VI: ptosis not present, extra-ocular motions intact bilaterally  V,VII: smile symmetric, facial light touch sensation equal VIII: hearing grossly normal to voice  X: uvula elevates symmetrically  XI: bilateral shoulder shrug symmetric and strong XII: midline tongue extension without fassiculations Motor:  Normal tone. 5/5 strength of BUE and BLE major muscle groups including strong and equal grip strength and dorsiflexion/plantar flexion Sensory: light touch normal in all extremities. Gait: normal gait and balance.      ED Results / Procedures / Treatments   Labs (all labs ordered are listed, but  only abnormal results are displayed) Labs Reviewed  BASIC METABOLIC PANEL - Abnormal; Notable for the following components:      Result Value   Glucose, Bld 151 (*)    All other components within normal limits   RESP PANEL BY RT-PCR (FLU A&B, COVID) ARPGX2  CBC  D-DIMER, QUANTITATIVE  TROPONIN I (HIGH SENSITIVITY)  TROPONIN I (HIGH SENSITIVITY)    EKG EKG Interpretation  Date/Time:  Wednesday March 25 2021 13:10:42 EDT Ventricular Rate:  68 PR Interval:  218 QRS Duration: 84 QT Interval:  370 QTC Calculation: 393 R Axis:   40 Text Interpretation: Sinus rhythm Low voltage QRS Non-specific ST-t changes Confirmed by Lajean Saver [phone]) on 03/25/2021 2:10:06 PM  Radiology DG Chest 2 View  Result Date: 03/25/2021 CLINICAL DATA:  Chest pain EXAM: CHEST - 2 VIEW COMPARISON:  02/27/2018 FINDINGS: The heart size and mediastinal contours are within normal limits. Both lungs are clear. The visualized skeletal structures are unremarkable. IMPRESSION: No active cardiopulmonary disease. Electronically Signed   By: Kathreen Devoid   On: 03/25/2021 13:49   CT Head Wo Contrast  Result Date: 03/25/2021 CLINICAL DATA:  Headache. EXAM: CT HEAD WITHOUT CONTRAST TECHNIQUE: Contiguous axial images were obtained from the base of the skull through the vertex without intravenous contrast. COMPARISON:  CT 08/14/2020. FINDINGS: Brain: Right frontal approach ventricular shunt catheter with the tip and similar position near the foramen of [place]ro. Visualized portions of the shunt catheter appear intact. Similar mild ventriculomegaly in comparison to 08/14/2020. The lateral ventricles measure 4.7 cm in transverse dimension anteriorly, similar to prior. Similar mild rounding of the temporal horns bilaterally. No evidence of acute large vascular territory infarct, acute hemorrhage, mass lesion, midline shift, or extra-axial fluid collection. Similar mild hypodensities in bifrontal white matter. Similar suboccipital craniectomy for Chiari decompression. Partially empty sella. Vascular: No hyperdense vessel identified. Skull: Suboccipital craniectomy.  No acute fracture. Sinuses/Orbits: Visualized sinuses are clear. No acute orbital  findings. Calcification along the left optic nerve she, similar over multiple priors. Other: No mastoid effusions. IMPRESSION: 1. Similar mild hydrocephalus when compared to 2021. Similar position of the right frontal approach ventricular shunt catheter. 2. Similar suboccipital craniectomy for Chiari decompression. 3. Calcification along the left optic nerve sheath, similar over multiple priors. Electronically Signed   By: Margaretha Sheffield MD   On: 03/25/2021 15:51    Procedures Procedures   Medications Ordered in ED Medications  prochlorperazine (COMPAZINE) injection 10 mg (10 mg Intravenous Given 03/25/21 1452)  diphenhydrAMINE (BENADRYL) injection 25 mg (25 mg Intravenous Given 03/25/21 1447)    ED Course  I have reviewed the triage vital signs and the nursing notes.  Pertinent labs & imaging results that were available during my care of the patient were reviewed by me and considered in my medical decision making (see chart for details).    MDM Rules/Calculators/A&P                          60 y/o female presents for eval of headache and chest pain, sob.   Reviewed/interpreted imaging CBC wnl BMP unremarkable Trop neg Ddimer neg, doubt pe COVID neg  EKG - Sinus rhythm Low voltage QRS Non-specific ST-t changes   Reviewed/interpreted imaging CT head - appears stable from prior CXR - No active cardiopulmonary disease.  60 year old female presenting for evaluation of headache, bilateral arm numbness that is subjective as well as some chest pain.  She was given meds for her headache  and on reassessment she feels improved. On exam her neurologic status is within normal limits without focal deficits.  There is no sensory deficits or weakness to the bilateral upper extremities.  She does not have any other concerning findings on neuro exam.  Her neurologic work-up here is reassuring.  With regards to her chest pain, her work-up is also reassuring.  Lance Bosch is negative, doubt ACS, D-dimer  negative doubt PE.  Doubt dissection, esophageal perforation or other emergent cause of symptoms.  Feel that she has been appropriately screened and she does not have any evidence of an emergent process at this time that would require further work-up or admission to the hospital.  Will advise her to follow-up with her PCP and return to the ED for new or worsening symptoms.  She voices understanding of the plan and reasons to return.  All questions answered.  Patient stable for discharge.  Case discussed with Dr. Ashok Cordia who personally evaluated the pt and is in agreement with plan  Final Clinical Impression(s) / ED Diagnoses Final diagnoses:  Nonintractable headache, unspecified chronicity pattern, unspecified headache type    Rx / DC Orders ED Discharge Orders     None        Bishop Dublin 03/25/21 1651    Lajean Saver, MD 03/28/21 1543

## 2021-03-25 NOTE — ED Triage Notes (Signed)
Pressure in back of head for over a week, also has chest pain , referred here by family doctor

## 2021-03-25 NOTE — Discharge Instructions (Addendum)
Please follow up with your primary care provider within 5-7 days for re-evaluation of your symptoms. If you do not have a primary care provider, information for a healthcare clinic has been provided for you to make arrangements for follow up care. Please return to the emergency department for any new or worsening symptoms. ° °

## 2021-04-29 ENCOUNTER — Ambulatory Visit (INDEPENDENT_AMBULATORY_CARE_PROVIDER_SITE_OTHER): Payer: Medicare PPO | Admitting: Nurse Practitioner

## 2021-05-07 ENCOUNTER — Telehealth (INDEPENDENT_AMBULATORY_CARE_PROVIDER_SITE_OTHER): Payer: Self-pay

## 2021-05-07 ENCOUNTER — Other Ambulatory Visit (INDEPENDENT_AMBULATORY_CARE_PROVIDER_SITE_OTHER): Payer: Self-pay | Admitting: Nurse Practitioner

## 2021-05-07 DIAGNOSIS — IMO0002 Reserved for concepts with insufficient information to code with codable children: Secondary | ICD-10-CM

## 2021-05-07 DIAGNOSIS — I10 Essential (primary) hypertension: Secondary | ICD-10-CM

## 2021-05-07 DIAGNOSIS — E785 Hyperlipidemia, unspecified: Secondary | ICD-10-CM

## 2021-05-07 DIAGNOSIS — S0990XA Unspecified injury of head, initial encounter: Secondary | ICD-10-CM

## 2021-05-07 DIAGNOSIS — Z8669 Personal history of other diseases of the nervous system and sense organs: Secondary | ICD-10-CM

## 2021-05-07 DIAGNOSIS — E1169 Type 2 diabetes mellitus with other specified complication: Secondary | ICD-10-CM

## 2021-05-07 DIAGNOSIS — E08618 Diabetes mellitus due to underlying condition with other diabetic arthropathy: Secondary | ICD-10-CM

## 2021-05-07 DIAGNOSIS — J310 Chronic rhinitis: Secondary | ICD-10-CM

## 2021-05-07 MED ORDER — OZEMPIC (1 MG/DOSE) 4 MG/3ML ~~LOC~~ SOPN: PEN_INJECTOR | SUBCUTANEOUS | 3 refills | Status: DC

## 2021-05-07 MED ORDER — FLUTICASONE PROPIONATE 50 MCG/ACT NA SUSP: 1.0000 | Freq: Every day | NASAL | 2 refills | Status: DC

## 2021-05-07 MED ORDER — AZELASTINE HCL 0.1 % NA SOLN: 1.0000 | Freq: Every day | NASAL | 2 refills | Status: DC

## 2021-05-07 MED ORDER — AMLODIPINE BESYLATE 5 MG PO TABS: 5.0000 mg | ORAL_TABLET | Freq: Every day | ORAL | 0 refills | Status: DC

## 2021-05-07 MED ORDER — ACCU-CHEK SOFTCLIX LANCETS MISC
1.0000 | Freq: Every day | 3 refills | Status: DC
Start: 2021-05-07 — End: 2023-03-01

## 2021-05-07 MED ORDER — ATORVASTATIN CALCIUM 20 MG PO TABS: 20.0000 mg | ORAL_TABLET | Freq: Every day | ORAL | 0 refills | Status: DC

## 2021-05-07 MED ORDER — CETIRIZINE HCL 10 MG PO TABS: ORAL_TABLET | ORAL | 0 refills | Status: DC

## 2021-05-07 MED ORDER — ACCU-CHEK AVIVA PLUS VI STRP: 1.0000 | ORAL_STRIP | Freq: Every day | 3 refills | Status: DC

## 2021-05-07 MED ORDER — PROPRANOLOL HCL 60 MG PO TABS: ORAL_TABLET | ORAL | 0 refills | Status: DC

## 2021-05-07 NOTE — Telephone Encounter (Signed)
Received a fax from [facility] requesting refills for the following medication:  atorvastatin (LIPITOR) 20 MG tablet  Last filled 01/27/2021, # 90 with 1 refill

## 2021-05-07 NOTE — Telephone Encounter (Signed)
Patient called and left a voice message that she needs other refills of her medications sent to [facility] and asked if you could review and send for her?

## 2021-05-09 ENCOUNTER — Encounter: Payer: Self-pay | Admitting: Emergency Medicine

## 2021-05-09 ENCOUNTER — Other Ambulatory Visit: Payer: Self-pay

## 2021-05-09 ENCOUNTER — Ambulatory Visit
Admission: EM | Admit: 2021-05-09 | Discharge: 2021-05-09 | Disposition: A | Discharge status: Home / Self Care | Payer: Medicare PPO | Attending: Emergency Medicine | Admitting: Emergency Medicine

## 2021-05-09 DIAGNOSIS — U071 COVID-19: Secondary | ICD-10-CM

## 2021-05-09 DIAGNOSIS — J069 Acute upper respiratory infection, unspecified: Secondary | ICD-10-CM

## 2021-05-09 MED ORDER — MOLNUPIRAVIR 200 MG PO CAPS: 800.0000 mg | ORAL_CAPSULE | Freq: Two times a day (BID) | ORAL | 0 refills | Status: AC

## 2021-05-09 NOTE — ED Provider Notes (Signed)
[facility]   [id number] 05/09/21 Arrival Time: [id number]   CC: COVID symptoms  SUBJECTIVE: History from: patient.  Amanda Webster is a 60 y.o. female who presents with headache, congestion, cough, ear fullness, and sore throat x 3 days.  Admits to covid exposure.  Denies alleviating or aggravating factors.  Denies previous covid infection in the past.   Denies SOB, wheezing, chest pain, nausea, changes in bowel or bladder habits.    ROS: As per HPI.  All other pertinent ROS negative.     Past Medical History:  Diagnosis Date   Allergy    Anxiety    Arnold-Chiari malformation (HCC)    Arthritis    Borderline diabetic    Chronic headaches    Colon polyps    nonadenomatous   Depression    Diabetes mellitus without complication (HCC)    Diverticulosis    Esophageal stricture    GERD (gastroesophageal reflux disease)    HTN (hypertension)    Hyperlipidemia    IBS (irritable bowel syndrome)    Seizures (HCC)    pt not sure when her last seizure was- she says she just spaces out- no shaking but she could not tell me last seizure-  on Oxtellar bid for seizures    Sleep apnea    no cpap now- did use 02 with cpap but has not used either in "years"   Past Surgical History:  Procedure Laterality Date   BREAST BIOPSY Left    cahri decompression  6 and 03/1999   CHOLECYSTECTOMY     COLONOSCOPY     invasive cervical traction  02/2009   left breast-lumpectomy     POLYPECTOMY     shunt put in on right side of brain     SHUNT REPLACEMENT  12/10   TOTAL ABDOMINAL HYSTERECTOMY  Age 72 yrs   TAH & BSO   TUBAL LIGATION     UPPER GASTROINTESTINAL ENDOSCOPY     Allergies  Allergen Reactions   Cymbalta [Duloxetine Hcl] Itching    Nasal itching   Propoxyphene N-Acetaminophen Itching   Sulfa Antibiotics    No current facility-administered medications on file prior to encounter.   Current Outpatient Medications on File Prior to Encounter  Medication Sig Dispense Refill    Accu-Chek Softclix Lancets lancets 1 each by Other route daily. as directed 100 each 3   AIMOVIG 70 MG/ML SOAJ every 30 (thirty) days.     amLODipine (NORVASC) 5 MG tablet Take 1 tablet (5 mg total) by mouth daily. 90 tablet 0   atorvastatin (LIPITOR) 20 MG tablet Take 1 tablet (20 mg total) by mouth daily. 90 tablet 0   azelastine (ASTELIN) 0.1 % nasal spray Place 1 spray into both nostrils daily at 12 noon. 30 mL 2   cetirizine (ZYRTEC) 10 MG tablet TAKE 1 TABLET BY MOUTH EVERY DAY 90 tablet 0   fluticasone (FLONASE) 50 MCG/ACT nasal spray Place 1 spray into both nostrils daily. 16 g 2   gabapentin (NEURONTIN) 300 MG capsule Take 600 mg by mouth at bedtime.     glucose blood (ACCU-CHEK AVIVA PLUS) test strip 1 each by Other route daily. Use as instructed 100 strip 3   naproxen sodium (ALEVE) 220 MG tablet Take 440 mg by mouth daily as needed (for pain).     Oxcarbazepine (TRILEPTAL) 300 MG tablet Take 300 mg by mouth 2 (two) times daily.     OZEMPIC, 1 MG/DOSE, 4 MG/3ML SOPN ADMINISTER 1 MG UNDER THE  SKIN 1 TIME A WEEK 3 mL 3   propranolol (INDERAL) 60 MG tablet TAKE 1 TABLET(60 MG) BY MOUTH TWICE DAILY 180 tablet 0   UNABLE TO FIND Aleve pain relieving lotion-daily     VITAMIN D PO Take 10,000 Units by mouth daily.     Social History   Socioeconomic History   Marital status: Married    Spouse name: Not on file   Number of children: Not on file   Years of education: Not on file   Highest education level: Not on file  Occupational History   Not on file  Tobacco Use   Smoking status: Former    Packs/day: 1.50    Years: 30.00    Pack years: 45.00    Types: Cigarettes    Quit date: 09/13/1990    Years since quitting: 30.6   Smokeless tobacco: Never  Vaping Use   Vaping Use: Never used  Substance and Sexual Activity   Alcohol use: No   Drug use: No   Sexual activity: Not Currently    Birth control/protection: Surgical    Comment: hyst  Other Topics Concern   Not on file   Social History Narrative   Disabled from Chiari malformation/seizures.. Drinks 4-5 cups of tea daily. Does not get regular exercise. Married for 40 years.   Social Determinants of Health   Financial Resource Strain: Medium Risk   Difficulty of Paying Living Expenses: Somewhat hard  Food Insecurity: No Food Insecurity   Worried About Charity fundraiser in the Last Year: Never true   Ran Out of Food in the Last Year: Never true  Transportation Needs: No Transportation Needs   Lack of Transportation (Medical): No   Lack of Transportation (Non-Medical): No  Physical Activity: Sufficiently Active   Days of Exercise per Week: 7 days   Minutes of Exercise per Session: 30 min  Stress: No Stress Concern Present   Feeling of Stress : Not at all  Social Connections: Moderately Isolated   Frequency of Communication with Friends and Family: More than three times a week   Frequency of Social Gatherings with Friends and Family: Once a week   Attends Religious Services: Never   Marine scientist or Organizations: No   Attends Music therapist: Never   Marital Status: Married  Human resources officer Violence: Not At Risk   Fear of Current or Ex-Partner: No   Emotionally Abused: No   Physically Abused: No   Sexually Abused: No   Family History  Problem Relation Age of Onset   Colon polyps Mother    Breast cancer Mother    Lung cancer Mother    Heart disease Father    Stroke Father    Aortic aneurysm Father    Chiari malformation Daughter    Addison's disease Son    Breast cancer Maternal Aunt    Colon cancer Neg Hx    Esophageal cancer Neg Hx    Rectal cancer Neg Hx    Stomach cancer Neg Hx     OBJECTIVE:  Vitals:   05/09/21 1510  BP: 120/81  Pulse: 78  Resp: 18  Temp: 100.3 F (37.9 C)  TempSrc: Oral  SpO2: 94%    General appearance: alert; appears fatigued, but nontoxic; speaking in full sentences and tolerating own secretions HEENT: NCAT; Ears: EACs clear,  TMs pearly gray; Eyes: PERRL.  EOM grossly intact. Nose: nares patent without rhinorrhea, Throat: oropharynx clear, tonsils non erythematous or enlarged, uvula midline  Neck: supple without LAD Lungs: unlabored respirations, symmetrical air entry; cough: mild; no respiratory distress; CTAB Heart: regular rate and rhythm.   Skin: warm and dry Psychological: alert and cooperative; normal mood and affect  ASSESSMENT & PLAN:  1. Viral URI with cough   2. COVID-19 virus infection     Meds ordered this encounter  Medications   Molnupiravir 200 MG CAPS    Sig: Take 4 capsules (800 mg total) by mouth in the morning and at bedtime for 5 days.    Dispense:  40 capsule    Refill:  0    Order Specific Question:   Supervising Provider    Answer:   Raylene Everts [id number]    You should remain isolated in your home for 5 days from symptom onset AND greater than 72 hours after symptoms resolution (absence of fever without the use of fever-reducing medication and improvement in respiratory symptoms), whichever is longer Get plenty of rest and push fluids Antiviral medication prescribed Use OTC zyrtec for nasal congestion, runny nose, and/or sore throat Use OTC flonase for nasal congestion and runny nose Use medications daily for symptom relief Use OTC medications like ibuprofen or tylenol as needed fever or pain Call or go to the ED if you have any new or worsening symptoms such as fever, worsening cough, shortness of breath, chest tightness, chest pain, turning blue, changes in mental status, etc...   Reviewed expectations re: course of current medical issues. Questions answered. Outlined signs and symptoms indicating need for more acute intervention. Patient verbalized understanding. After Visit Summary given.          Lestine Box, PA-C 05/09/21 1521

## 2021-05-09 NOTE — Discharge Instructions (Addendum)
You should remain isolated in your home for 5 days from symptom onset AND greater than 72 hours after symptoms resolution (absence of fever without the use of fever-reducing medication and improvement in respiratory symptoms), whichever is longer Get plenty of rest and push fluids Antiviral medication prescribed Use OTC zyrtec for nasal congestion, runny nose, and/or sore throat Use OTC flonase for nasal congestion and runny nose Use medications daily for symptom relief Use OTC medications like ibuprofen or tylenol as needed fever or pain Call or go to the ED if you have any new or worsening symptoms such as fever, worsening cough, shortness of breath, chest tightness, chest pain, turning blue, changes in mental status, etc..Marland Kitchen

## 2021-05-09 NOTE — ED Triage Notes (Addendum)
Headache, cough and congestion, ear fullness and sore throat .  S/s started on Wednesday.  Pt has had 2 positive home  covid test.  Would like something for cough and antivirals.

## 2021-06-03 LAB — HM DIABETES EYE EXAM

## 2021-06-09 ENCOUNTER — Encounter: Payer: Self-pay | Admitting: Cardiology

## 2021-06-09 ENCOUNTER — Ambulatory Visit: Payer: Medicare PPO | Admitting: Cardiology

## 2021-06-09 VITALS — BP 142/80 | HR 70 | Ht 62.0 in | Wt 192.8 lb

## 2021-06-09 DIAGNOSIS — I10 Essential (primary) hypertension: Secondary | ICD-10-CM | POA: Diagnosis not present

## 2021-06-09 DIAGNOSIS — R0789 Other chest pain: Secondary | ICD-10-CM | POA: Diagnosis not present

## 2021-06-09 DIAGNOSIS — R002 Palpitations: Secondary | ICD-10-CM

## 2021-06-09 NOTE — Patient Instructions (Addendum)
Medication Instructions:  Continue all current medications.  Labwork: none  Testing/Procedures: none  Follow-Up: 6 months   Any Other Special Instructions Will Be Listed Below (If Applicable). Please call the office back on Friday with update on BP readings.   If you need a refill on your cardiac medications before your next appointment, please call your pharmacy.

## 2021-06-09 NOTE — Progress Notes (Signed)
Clinical Summary Ms. Dues is a 60 y.o.female seen today for follow up of the following medical problems.    1. Chest pain   From prior visit:  - seen in ER 07/18/16 with CP - thought to be chestwall pain from bronchitis, coughing. Given z-pak. Prilosec given for GERD symptoms.   - pain started several months ago. Started a pressure, midchest. 5-6/10 in severity. Can have some diaphoresis. Can have some SOB. Occurs several times a week. Not positional. No relation to food. Symptoms can last up to 1 hour. Can occur at anytime. - can have frequent belching - DOE at 1 block, which is stable.   - CAD risk factors: DM2, previous history of HTN now off meds, ?HL, +tobacco in the past about 18 years total, father MIs late 62s to early 62s - cannot run on treadmill due to chronic back pain.   - normal stress test 08/2016         - recently symptoms of chest pains, palpitations.  - + palpitations, pressure, +SOB. Typically occurs at night - Lasts 5-10 minutes. Can awake her from sleep - sedentary lifestyle.  - pepsi 16 oz per day, no coffee, no tea, no energy drinks, no EtOH.    10/2020 event monitor: rare ectopy isolated 4 beat run of SVT - no significant recent symptoms   2. OSA - does not wear cpap due to discomfort  3. COVID + 04/2021 - treated with molnupiravir  4. HTN - compliant with meds    On plavix per Dr Merlene Laughter On propranolol, history of migraines  Past Medical History:  Diagnosis Date   Allergy    Anxiety    Arnold-Chiari malformation ([place])    Arthritis    Borderline diabetic    Chronic headaches    Colon polyps    nonadenomatous   Depression    Diabetes mellitus without complication ([place])    Diverticulosis    Esophageal stricture    GERD (gastroesophageal reflux disease)    HTN (hypertension)    Hyperlipidemia    IBS (irritable bowel syndrome)    Seizures ([place])    pt not sure when her last seizure was- she says she just spaces out- no shaking  but she could not tell me last seizure-  on Oxtellar bid for seizures    Sleep apnea    no cpap now- did use 02 with cpap but has not used either in "years"     Allergies  Allergen Reactions   Cymbalta [Duloxetine Hcl] Itching    Nasal itching   Propoxyphene N-Acetaminophen Itching   Sulfa Antibiotics      Current Outpatient Medications  Medication Sig Dispense Refill   Accu-Chek Softclix Lancets lancets 1 each by Other route daily. as directed 100 each 3   AIMOVIG 70 MG/ML SOAJ every 30 (thirty) days.     amLODipine (NORVASC) 5 MG tablet Take 1 tablet (5 mg total) by mouth daily. 90 tablet 0   atorvastatin (LIPITOR) 20 MG tablet Take 1 tablet (20 mg total) by mouth daily. 90 tablet 0   azelastine (ASTELIN) 0.1 % nasal spray Place 1 spray into both nostrils daily at 12 noon. 30 mL 2   cetirizine (ZYRTEC) 10 MG tablet TAKE 1 TABLET BY MOUTH EVERY DAY 90 tablet 0   fluticasone (FLONASE) 50 MCG/ACT nasal spray Place 1 spray into both nostrils daily. 16 g 2   gabapentin (NEURONTIN) 300 MG capsule Take 600 mg by mouth at bedtime.  glucose blood (ACCU-CHEK AVIVA PLUS) test strip 1 each by Other route daily. Use as instructed 100 strip 3   naproxen sodium (ALEVE) 220 MG tablet Take 440 mg by mouth daily as needed (for pain).     Oxcarbazepine (TRILEPTAL) 300 MG tablet Take 300 mg by mouth 2 (two) times daily.     OZEMPIC, 1 MG/DOSE, 4 MG/3ML SOPN ADMINISTER 1 MG UNDER THE SKIN 1 TIME A WEEK 3 mL 3   propranolol (INDERAL) 60 MG tablet TAKE 1 TABLET(60 MG) BY MOUTH TWICE DAILY 180 tablet 0   UNABLE TO FIND Aleve pain relieving lotion-daily     VITAMIN D PO Take 10,000 Units by mouth daily.     No current facility-administered medications for this visit.     Past Surgical History:  Procedure Laterality Date   BREAST BIOPSY Left    cahri decompression  6 and 03/1999   CHOLECYSTECTOMY     COLONOSCOPY     invasive cervical traction  02/2009   left breast-lumpectomy     POLYPECTOMY      shunt put in on right side of brain     SHUNT REPLACEMENT  12/10   TOTAL ABDOMINAL HYSTERECTOMY  Age 6 yrs   TAH & BSO   TUBAL LIGATION     UPPER GASTROINTESTINAL ENDOSCOPY       Allergies  Allergen Reactions   Cymbalta [Duloxetine Hcl] Itching    Nasal itching   Propoxyphene N-Acetaminophen Itching   Sulfa Antibiotics       Family History  Problem Relation Age of Onset   Colon polyps Mother    Breast cancer Mother    Lung cancer Mother    Heart disease Father    Stroke Father    Aortic aneurysm Father    Chiari malformation Daughter    Addison's disease Son    Breast cancer Maternal Aunt    Colon cancer Neg Hx    Esophageal cancer Neg Hx    Rectal cancer Neg Hx    Stomach cancer Neg Hx      Social History Ms. Keach reports that she quit smoking about 30 years ago. Her smoking use included cigarettes. She has a 45.00 pack-year smoking history. She has never used smokeless tobacco. Ms. Burpee reports no history of alcohol use.   Review of Systems CONSTITUTIONAL: No weight loss, fever, chills, weakness or fatigue.  HEENT: Eyes: No visual loss, blurred vision, double vision or yellow sclerae.No hearing loss, sneezing, congestion, runny nose or sore throat.  SKIN: No rash or itching.  CARDIOVASCULAR: per hpi RESPIRATORY: No shortness of breath, cough or sputum.  GASTROINTESTINAL: No anorexia, nausea, vomiting or diarrhea. No abdominal pain or blood.  GENITOURINARY: No burning on urination, no polyuria NEUROLOGICAL: No headache, dizziness, syncope, paralysis, ataxia, numbness or tingling in the extremities. No change in bowel or bladder control.  MUSCULOSKELETAL: No muscle, back pain, joint pain or stiffness.  LYMPHATICS: No enlarged nodes. No history of splenectomy.  PSYCHIATRIC: No history of depression or anxiety.  ENDOCRINOLOGIC: No reports of sweating, cold or heat intolerance. No polyuria or polydipsia.  Marland Kitchen   Physical Examination Today's Vitals    06/09/21 1355  BP: (!) 142/80  Pulse: 70  SpO2: 98%  Weight: 192 lb 12.8 oz (87.5 kg)  Height: 5\' 2"  (1.575 m)   Body mass index is 35.26 kg/m.  Gen: resting comfortably, no acute distress HEENT: no scleral icterus, pupils equal round and reactive, no palptable cervical adenopathy,  CV: RRR, no  m/r/g no jvd Resp: Clear to auscultation bilaterally GI: abdomen is soft, non-tender, non-distended, normal bowel sounds, no hepatosplenomegaly MSK: extremities are warm, no edema.  Skin: warm, no rash Neuro:  no focal deficits Psych: appropriate affect   Diagnostic Studies 08/2016 Nuclear stress test No diagnostic ST segment changes to indicate ischemia. No significant myocardial perfusion defects to indicate scar or ischemia. This is a low risk study. Nuclear stress EF: 80%.  10/2020 monitor 7 day monitor No reported patient symptoms or triggered events Rare supraventricular and ventricular ectopy. Isolated 4 beat run of SVT     Patch Wear Time:  7 days and 2 hours (2022-02-02T15:09:51-0500 to 2022-02-09T17:54:08-499)   Patient had a min HR of 48 bpm, max HR of 114 bpm, and avg HR of 69 bpm. Predominant underlying rhythm was Sinus Rhythm. 1 run of Supraventricular Tachycardia occurred lasting 4 beats with a max rate of 114 bpm (avg 111 bpm). Isolated SVEs were rare  (<1.0%), SVE Couplets were rare (<1.0%), and SVE Triplets were rare (<1.0%). Isolated VEs were rare (<1.0%), VE Couplets were rare (<1.0%), and no VE Triplets were present.   Assessment and Plan  1. Chest pain/Palpitations - negative stress test 08/2016 - recent event monitor with benign ectopy - symptoms overall improved, no further testing at this time. Could titrate propranolol if recurrence  2. HTN - elevated here, she will call and update [place] on home bp's on Friday - if consistently elevated would increase norvasc to 10mg  daily    F/u 6 months  Arnoldo Lenis, M.D.

## 2021-06-15 ENCOUNTER — Other Ambulatory Visit (INDEPENDENT_AMBULATORY_CARE_PROVIDER_SITE_OTHER): Payer: Self-pay | Admitting: Nurse Practitioner

## 2021-06-15 ENCOUNTER — Telehealth: Payer: Self-pay | Admitting: Cardiology

## 2021-06-15 DIAGNOSIS — J310 Chronic rhinitis: Secondary | ICD-10-CM

## 2021-06-15 NOTE — Telephone Encounter (Signed)
Fwd to provider for review.

## 2021-06-15 NOTE — Telephone Encounter (Signed)
BP READING:  06/09/2021 162/85 06/10/2021 148/84 06/11/2021 144/79 06/12/2021 175/85 06/15/2021 186/104  States that she tool BP for 10-1 & 10-2 but didn't write them down.

## 2021-06-16 NOTE — Telephone Encounter (Signed)
Left message to return call 

## 2021-06-16 NOTE — Telephone Encounter (Signed)
BP's too high, can she increase norvasc to 10mg  daily. UPdate [place] on bp's next week  Zandra Abts MD

## 2021-06-18 NOTE — Telephone Encounter (Signed)
Pt is returning your call

## 2021-06-22 MED ORDER — AMLODIPINE BESYLATE 10 MG PO TABS: 10.0000 mg | ORAL_TABLET | Freq: Every day | ORAL | 1 refills | Status: DC

## 2021-06-22 NOTE — Telephone Encounter (Signed)
Did she increase her norvasc to 10mg ? If so please update her epic list and would start losartan 25mg  daily, needs bmet 2 weeks and update [place]  on bp's 1 week. If did not increase norvasc would do that first   Zandra Abts MD

## 2021-06-22 NOTE — Telephone Encounter (Signed)
Pt stated she was unaware to increase Norvasc to 10 mg daily. Pt will try this first and update office in 1 week with bp readings. Medication list updated to reflect changes.

## 2021-06-22 NOTE — Telephone Encounter (Signed)
Patient called stating that her BP continues to stay high. States that yesterday she had a headache with dizziness. States that she had a heaviness on her chest.   167/91  06/22/2021 10:10am  197/100 06/21/2021  195/95  06/21/2021

## 2021-07-06 ENCOUNTER — Ambulatory Visit: Payer: Medicare PPO | Admitting: Internal Medicine

## 2021-07-06 ENCOUNTER — Other Ambulatory Visit: Payer: Self-pay

## 2021-07-06 ENCOUNTER — Encounter: Payer: Self-pay | Admitting: Internal Medicine

## 2021-07-06 VITALS — BP 127/82 | HR 62 | Temp 97.7°F | Resp 18 | Ht 62.0 in | Wt 198.1 lb

## 2021-07-06 DIAGNOSIS — E1169 Type 2 diabetes mellitus with other specified complication: Secondary | ICD-10-CM

## 2021-07-06 DIAGNOSIS — Z7689 Persons encountering health services in other specified circumstances: Secondary | ICD-10-CM

## 2021-07-06 DIAGNOSIS — I951 Orthostatic hypotension: Secondary | ICD-10-CM | POA: Insufficient documentation

## 2021-07-06 DIAGNOSIS — L659 Nonscarring hair loss, unspecified: Secondary | ICD-10-CM | POA: Insufficient documentation

## 2021-07-06 DIAGNOSIS — E785 Hyperlipidemia, unspecified: Secondary | ICD-10-CM

## 2021-07-06 DIAGNOSIS — G40109 Localization-related (focal) (partial) symptomatic epilepsy and epileptic syndromes with simple partial seizures, not intractable, without status epilepticus: Secondary | ICD-10-CM

## 2021-07-06 DIAGNOSIS — G5603 Carpal tunnel syndrome, bilateral upper limbs: Secondary | ICD-10-CM | POA: Insufficient documentation

## 2021-07-06 DIAGNOSIS — I10 Essential (primary) hypertension: Secondary | ICD-10-CM

## 2021-07-06 DIAGNOSIS — M130 Polyarthritis, unspecified: Secondary | ICD-10-CM

## 2021-07-06 DIAGNOSIS — Z2821 Immunization not carried out because of patient refusal: Secondary | ICD-10-CM

## 2021-07-06 DIAGNOSIS — G40802 Other epilepsy, not intractable, without status epilepticus: Secondary | ICD-10-CM | POA: Insufficient documentation

## 2021-07-06 MED ORDER — PROPRANOLOL HCL 60 MG PO TABS: ORAL_TABLET | ORAL | 0 refills | Status: DC

## 2021-07-06 MED ORDER — ATORVASTATIN CALCIUM 20 MG PO TABS: 20.0000 mg | ORAL_TABLET | Freq: Every day | ORAL | 0 refills | Status: DC

## 2021-07-06 NOTE — Patient Instructions (Signed)
Please continue taking medications as prescribed.  Continue to follow low carb diet and perform moderate exercise/walking at least 150 mins/week.  Please get fasting blood tests done within a week.

## 2021-07-08 LAB — CBC WITH DIFFERENTIAL/PLATELET
Basophils Absolute: 0 10*3/uL (ref 0.0–0.2)
Basos: 0 %
EOS (ABSOLUTE): 0.1 10*3/uL (ref 0.0–0.4)
Eos: 2 %
Hematocrit: 41.2 % (ref 34.0–46.6)
Hemoglobin: 14 g/dL (ref 11.1–15.9)
Immature Grans (Abs): 0 10*3/uL (ref 0.0–0.1)
Immature Granulocytes: 0 %
Lymphocytes Absolute: 2.2 10*3/uL (ref 0.7–3.1)
Lymphs: 40 %
MCH: 30.8 pg (ref 26.6–33.0)
MCHC: 34 g/dL (ref 31.5–35.7)
MCV: 91 fL (ref 79–97)
Monocytes Absolute: 0.6 10*3/uL (ref 0.1–0.9)
Monocytes: 10 %
Neutrophils Absolute: 2.7 10*3/uL (ref 1.4–7.0)
Neutrophils: 48 %
Platelets: 178 10*3/uL (ref 150–450)
RBC: 4.54 x10E6/uL (ref 3.77–5.28)
RDW: 12.4 % (ref 11.7–15.4)
WBC: 5.6 10*3/uL (ref 3.4–10.8)

## 2021-07-08 LAB — LIPID PANEL
Chol/HDL Ratio: 3.3 ratio (ref 0.0–4.4)
Cholesterol, Total: 169 mg/dL (ref 100–199)
HDL: 51 mg/dL (ref >39)
LDL Chol Calc (NIH): 90 mg/dL (ref 0–99)
Triglycerides: 160 mg/dL — ABNORMAL HIGH (ref 0–149)
VLDL Cholesterol Cal: 28 mg/dL (ref 5–40)

## 2021-07-08 LAB — CMP14+EGFR
ALT: 23 IU/L (ref 0–32)
AST: 18 IU/L (ref 0–40)
Albumin/Globulin Ratio: 2.5 — ABNORMAL HIGH (ref 1.2–2.2)
Albumin: 4.7 g/dL (ref 3.8–4.9)
Alkaline Phosphatase: 158 IU/L — ABNORMAL HIGH (ref 44–121)
BUN/Creatinine Ratio: 10 (ref 9–23)
BUN: 7 mg/dL (ref 6–24)
Bilirubin Total: 0.2 mg/dL (ref 0.0–1.2)
CO2: 27 mmol/L (ref 20–29)
Calcium: 9.9 mg/dL (ref 8.7–10.2)
Chloride: 98 mmol/L (ref 96–106)
Creatinine, Ser: 0.71 mg/dL (ref 0.57–1.00)
Globulin, Total: 1.9 g/dL (ref 1.5–4.5)
Glucose: 144 mg/dL — ABNORMAL HIGH (ref 70–99)
Potassium: 4.7 mmol/L (ref 3.5–5.2)
Sodium: 137 mmol/L (ref 134–144)
Total Protein: 6.6 g/dL (ref 6.0–8.5)
eGFR: 98 mL/min/{1.73_m2} (ref >59)

## 2021-07-08 LAB — TSH+FREE T4
Free T4: 1.3 ng/dL (ref 0.82–1.77)
TSH: 2.27 u[IU]/mL (ref 0.450–4.500)

## 2021-07-08 LAB — HEMOGLOBIN A1C
Est. average glucose Bld gHb Est-mCnc: 160 mg/dL
Hgb A1c MFr Bld: 7.2 % — ABNORMAL HIGH (ref 4.8–5.6)

## 2021-07-09 ENCOUNTER — Telehealth: Payer: Self-pay | Admitting: Internal Medicine

## 2021-07-09 ENCOUNTER — Telehealth: Payer: Self-pay

## 2021-07-09 NOTE — Telephone Encounter (Signed)
Patient returning call about lab results.  

## 2021-07-09 NOTE — Telephone Encounter (Signed)
Pt advised of lab results with verbal understanding  

## 2021-07-09 NOTE — Telephone Encounter (Signed)
LVM for pt to call the office regarding lab results

## 2021-07-09 NOTE — Telephone Encounter (Signed)
Pt called to return phone call about blood work

## 2021-07-10 ENCOUNTER — Encounter: Payer: Self-pay | Admitting: Internal Medicine

## 2021-07-10 ENCOUNTER — Encounter: Payer: Self-pay | Admitting: *Deleted

## 2021-07-10 NOTE — Assessment & Plan Note (Addendum)
Chronic, unclear etiology Advised to continue biotin and zinc supplement for now Refer to dermatology if persistent complaint

## 2021-07-10 NOTE — Assessment & Plan Note (Signed)
Lab Results  Component Value Date   HGBA1C 7.2 (H) 07/07/2021   On Ozempic 1 mg qw Advised to follow diabetic diet On statin F/u CMP and lipid panel Diabetic eye exam: Advised to follow up with Ophthalmology for diabetic eye exam

## 2021-07-10 NOTE — Assessment & Plan Note (Signed)
BP Readings from Last 1 Encounters:  07/06/21 127/82   Well-controlled with Amlodipine and Propranolol Counseled for compliance with the medications Advised DASH diet and moderate exercise/walking, at least 150 mins/week

## 2021-07-10 NOTE — Assessment & Plan Note (Addendum)
Has type II DM, HLD and HTN Diet modification and moderate exercise/walking as tolerated On GLP-1 agonist therapy for type II DM

## 2021-07-10 NOTE — Assessment & Plan Note (Addendum)
OA of knee, hands and degenerative disc disease of lumbar spine Has had cervical spinal fusion surgery On gabapentin, followed by pain clinic Takes naproxen as needed

## 2021-07-10 NOTE — Assessment & Plan Note (Addendum)
Takes Trileptal Followed by Neurology 

## 2021-07-10 NOTE — Assessment & Plan Note (Signed)
On statin Check lipid profile 

## 2021-07-10 NOTE — Progress Notes (Signed)
New Patient Office Visit  Subjective:  Patient ID: Amanda Webster, female    DOB: 1961/04/25  Age: 60 y.o. MRN: [id number]  CC:  Chief Complaint  Patient presents with   New Patient (Initial Visit)    New patient was seeing dr Anastasio Champion is having issues with hair loss really bad     HPI Amanda Webster is a 60 year old female with PMH of HTN, type II DM with HLD, polyarthritis, migraine, seizure disorder, allergic rhinitis and morbid obesity who presents for establishing care.  HTN: BP is well-controlled.  She takes amlodipine and propranolol.  Chart review suggests history of seizure disorder/tremors and migraine, for which she might be taking propranolol.  Patient denies headache, dizziness, chest pain, dyspnea or palpitations.  Type II DM with HLD: Her last HbA1C was 7.2.  She takes Ozempic 1 mg every week.  She denies any polyuria or polydipsia.  She takes Lipitor as well.  Polyarthritis: She has history of OA of knees, hands and degenerative disc disease of cervical, thoracic and lumbar spine.  She follows up with pain clinic and is on gabapentin.  She also takes naproxen as needed.  She has history of seizure disorder, for which she takes Trileptal.  She follows up with neurology.  She has history of Chiary malformation, and has had decompression surgery.  She also has had VP shunt surgery in the past.  She has a history of migraine, for which she gets Aimovig.  She also takes propranolol for migraine and HTN.  She complains of chronic hair loss, for which she has recently started taking biotin and zinc supplement.  Denies any scalp irritation or patches of hair loss.    Past Medical History:  Diagnosis Date   Allergy    Anxiety    Arnold-Chiari malformation ([facility])    Arthritis    Borderline diabetic    Chronic headaches    Colon polyps    nonadenomatous   Depression    Diabetes mellitus without complication ([place])    Diverticulosis    Esophageal stricture     FROZEN LEFT SHOULDER 04/01/2010   Qualifier: Diagnosis of  By: Aline Brochure MD, Dorothyann Peng     GERD (gastroesophageal reflux disease)    HTN (hypertension)    Hyperlipidemia    IBS (irritable bowel syndrome)    Seizures ([place])    pt not sure when her last seizure was- she says she just spaces out- no shaking but she could not tell me last seizure-  on Oxtellar bid for seizures    Sleep apnea    no cpap now- did use 02 with cpap but has not used either in "years"    Past Surgical History:  Procedure Laterality Date   BREAST BIOPSY Left    cahri decompression  6 and 03/1999   CHOLECYSTECTOMY     COLONOSCOPY     invasive cervical traction  02/2009   left breast-lumpectomy     POLYPECTOMY     shunt put in on right side of brain     SHUNT REPLACEMENT  12/10   TOTAL ABDOMINAL HYSTERECTOMY  Age 78 yrs   TAH & BSO   TUBAL LIGATION     UPPER GASTROINTESTINAL ENDOSCOPY      Family History  Problem Relation Age of Onset   Colon polyps Mother    Breast cancer Mother    Lung cancer Mother    Heart disease Father    Stroke Father    Aortic aneurysm Father  Chiari malformation Daughter    Addison's disease Son    Breast cancer Maternal Aunt    Colon cancer Neg Hx    Esophageal cancer Neg Hx    Rectal cancer Neg Hx    Stomach cancer Neg Hx     Social History   Socioeconomic History   Marital status: Married    Spouse name: Not on file   Number of children: Not on file   Years of education: Not on file   Highest education level: Not on file  Occupational History   Not on file  Tobacco Use   Smoking status: Former    Packs/day: 1.50    Years: 30.00    Pack years: 45.00    Types: Cigarettes    Quit date: 09/13/1990    Years since quitting: 30.8   Smokeless tobacco: Never  Vaping Use   Vaping Use: Never used  Substance and Sexual Activity   Alcohol use: No   Drug use: No   Sexual activity: Not Currently    Birth control/protection: Surgical    Comment: hyst  Other Topics  Concern   Not on file  Social History Narrative   Disabled from Chiari malformation/seizures.. Drinks 4-5 cups of tea daily. Does not get regular exercise. Married for 40 years.   Social Determinants of Health   Financial Resource Strain: Not on file  Food Insecurity: Not on file  Transportation Needs: Not on file  Physical Activity: Not on file  Stress: Not on file  Social Connections: Not on file  Intimate Partner Violence: Not on file    ROS Review of Systems  Constitutional:  Negative for chills and fever.  HENT:  Negative for congestion, sinus pressure and sinus pain.   Eyes:  Negative for pain and discharge.  Respiratory:  Negative for cough and shortness of breath.   Cardiovascular:  Negative for chest pain and palpitations.  Gastrointestinal:  Negative for diarrhea, nausea and vomiting.  Genitourinary:  Negative for dysuria and hematuria.  Musculoskeletal:  Positive for arthralgias, back pain and gait problem. Negative for neck pain and neck stiffness.  Skin:  Negative for rash.  Neurological:  Negative for dizziness and weakness.  Psychiatric/Behavioral:  Negative for agitation.    Objective:   Today's Vitals: BP 127/82 (BP Location: Left Arm, Patient Position: Sitting, Cuff Size: Normal)   Pulse 62   Temp 97.7 F (36.5 C) (Oral)   Resp 18   Ht 5' 2" (1.575 m)   Wt 198 lb 1.3 oz (89.8 kg)   SpO2 94%   BMI 36.23 kg/m   Physical Exam Vitals reviewed.  Constitutional:      General: She is not in acute distress.    Appearance: She is obese. She is not diaphoretic.  HENT:     Head: Normocephalic and atraumatic.     Nose: Nose normal.     Mouth/Throat:     Mouth: Mucous membranes are moist.  Eyes:     General: No scleral icterus.    Extraocular Movements: Extraocular movements intact.  Cardiovascular:     Rate and Rhythm: Normal rate and regular rhythm.     Pulses: Normal pulses.     Heart sounds: Normal heart sounds. No murmur heard. Pulmonary:      Breath sounds: Normal breath sounds. No wheezing or rales.  Musculoskeletal:     Cervical back: Neck supple. No tenderness.     Right lower leg: No edema.     Left lower leg: No  edema.  Skin:    General: Skin is warm.     Findings: No rash.  Neurological:     General: No focal deficit present.     Mental Status: She is alert and oriented to person, place, and time.  Psychiatric:        Mood and Affect: Mood normal.        Behavior: Behavior normal.    Assessment & Plan:   Problem List Items Addressed This Visit       Cardiovascular and Mediastinum   Essential hypertension    BP Readings from Last 1 Encounters:  07/06/21 127/82  Well-controlled with Amlodipine and Propranolol Counseled for compliance with the medications Advised DASH diet and moderate exercise/walking, at least 150 mins/week       Relevant Medications   atorvastatin (LIPITOR) 20 MG tablet   propranolol (INDERAL) 60 MG tablet   Other Relevant Orders   CBC with Differential/Platelet (Completed)   TSH + free T4 (Completed)     Endocrine   Diabetes mellitus ([place]) - Primary    Lab Results  Component Value Date   HGBA1C 7.2 (H) 07/07/2021  On Ozempic 1 mg qw Advised to follow diabetic diet On statin F/u CMP and lipid panel Diabetic eye exam: Advised to follow up with Ophthalmology for diabetic eye exam      Relevant Medications   atorvastatin (LIPITOR) 20 MG tablet   Other Relevant Orders   CMP14+EGFR (Completed)   HgB A1c (Completed)   Hyperlipidemia associated with type 2 diabetes mellitus ([facility])    On statin Check lipid profile      Relevant Medications   atorvastatin (LIPITOR) 20 MG tablet   propranolol (INDERAL) 60 MG tablet   Other Relevant Orders   Lipid panel (Completed)     Nervous and Auditory   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)    Takes Trileptal Followed by Neurology        Musculoskeletal and Integument   Polyarthritis    OA of knee, hands and degenerative disc disease  of lumbar spine Has had cervical spinal fusion surgery On gabapentin, followed by pain clinic Takes naproxen as needed        Other   Hair loss    Chronic, unclear etiology Advised to continue biotin and zinc supplement for now Refer to dermatology if persistent complaint      Relevant Orders   TSH + free T4 (Completed)   Morbid obesity ([place])    Has type II DM, HLD and HTN Diet modification and moderate exercise/walking as tolerated On GLP-1 agonist therapy for type II DM      Other Visit Diagnoses     Encounter to establish care       Relevant Orders   CBC with Differential/Platelet (Completed)   Refused influenza vaccine           Outpatient Encounter Medications as of 07/06/2021  Medication Sig   Accu-Chek Softclix Lancets lancets 1 each by Other route daily. as directed   acetaminophen (TYLENOL) 80 MG chewable tablet Chew 80 mg by mouth every 6 (six) hours as needed.   amLODipine (NORVASC) 10 MG tablet Take 1 tablet (10 mg total) by mouth daily.   azelastine (ASTELIN) 0.1 % nasal spray Place 1 spray into both nostrils daily at 12 noon.   cetirizine (ZYRTEC) 10 MG tablet TAKE 1 TABLET BY MOUTH EVERY DAY   fluticasone (FLONASE) 50 MCG/ACT nasal spray Place 1 spray into both nostrils daily.   gabapentin (NEURONTIN) 300  MG capsule Take 600 mg by mouth at bedtime.   glucose blood (ACCU-CHEK AVIVA PLUS) test strip 1 each by Other route daily. Use as instructed   methocarbamol (ROBAXIN) 500 MG tablet Take 500 mg by mouth every 6 (six) hours as needed.   naproxen sodium (ALEVE) 220 MG tablet Take 440 mg by mouth daily as needed (for pain).   Oxcarbazepine (TRILEPTAL) 300 MG tablet Take 300 mg by mouth 2 (two) times daily.   OZEMPIC, 1 MG/DOSE, 4 MG/3ML SOPN ADMINISTER 1 MG UNDER THE SKIN 1 TIME A WEEK   UNABLE TO FIND Aleve pain relieving lotion-daily   VITAMIN D PO Take 10,000 Units by mouth daily.   [DISCONTINUED] atorvastatin (LIPITOR) 20 MG tablet Take 1 tablet (20 mg  total) by mouth daily.   [DISCONTINUED] propranolol (INDERAL) 60 MG tablet TAKE 1 TABLET(60 MG) BY MOUTH TWICE DAILY   atorvastatin (LIPITOR) 20 MG tablet Take 1 tablet (20 mg total) by mouth daily.   propranolol (INDERAL) 60 MG tablet TAKE 1 TABLET(60 MG) BY MOUTH TWICE DAILY   [DISCONTINUED] AIMOVIG 70 MG/ML SOAJ every 30 (thirty) days. (Patient not taking: Reported on 07/06/2021)   No facility-administered encounter medications on file as of 07/06/2021.    Follow-up: Return in about 3 months (around 10/06/2021) for HTN and type 2 DM.   [name], MD

## 2021-07-13 ENCOUNTER — Other Ambulatory Visit (INDEPENDENT_AMBULATORY_CARE_PROVIDER_SITE_OTHER): Payer: Self-pay | Admitting: Nurse Practitioner

## 2021-07-20 ENCOUNTER — Other Ambulatory Visit (INDEPENDENT_AMBULATORY_CARE_PROVIDER_SITE_OTHER): Payer: Self-pay | Admitting: Nurse Practitioner

## 2021-07-20 DIAGNOSIS — J310 Chronic rhinitis: Secondary | ICD-10-CM

## 2021-07-30 ENCOUNTER — Encounter: Payer: Self-pay | Admitting: Internal Medicine

## 2021-09-26 ENCOUNTER — Other Ambulatory Visit: Payer: Self-pay | Admitting: Internal Medicine

## 2021-09-26 ENCOUNTER — Other Ambulatory Visit: Payer: Self-pay | Admitting: Cardiology

## 2021-09-26 ENCOUNTER — Other Ambulatory Visit (INDEPENDENT_AMBULATORY_CARE_PROVIDER_SITE_OTHER): Payer: Self-pay | Admitting: Nurse Practitioner

## 2021-09-26 DIAGNOSIS — E785 Hyperlipidemia, unspecified: Secondary | ICD-10-CM

## 2021-09-26 DIAGNOSIS — I10 Essential (primary) hypertension: Secondary | ICD-10-CM

## 2021-09-26 DIAGNOSIS — E1169 Type 2 diabetes mellitus with other specified complication: Secondary | ICD-10-CM

## 2021-10-06 ENCOUNTER — Ambulatory Visit: Payer: Medicare PPO | Admitting: Internal Medicine

## 2021-10-06 ENCOUNTER — Encounter: Payer: Self-pay | Admitting: Internal Medicine

## 2021-10-06 ENCOUNTER — Other Ambulatory Visit: Payer: Self-pay | Admitting: *Deleted

## 2021-10-06 ENCOUNTER — Other Ambulatory Visit: Payer: Self-pay

## 2021-10-06 VITALS — BP 138/86 | HR 66 | Resp 18 | Ht 62.0 in | Wt 193.0 lb

## 2021-10-06 DIAGNOSIS — E1169 Type 2 diabetes mellitus with other specified complication: Secondary | ICD-10-CM

## 2021-10-06 DIAGNOSIS — Z114 Encounter for screening for human immunodeficiency virus [HIV]: Secondary | ICD-10-CM

## 2021-10-06 DIAGNOSIS — E785 Hyperlipidemia, unspecified: Secondary | ICD-10-CM

## 2021-10-06 DIAGNOSIS — K5904 Chronic idiopathic constipation: Secondary | ICD-10-CM

## 2021-10-06 DIAGNOSIS — R519 Headache, unspecified: Secondary | ICD-10-CM

## 2021-10-06 DIAGNOSIS — I10 Essential (primary) hypertension: Secondary | ICD-10-CM

## 2021-10-06 DIAGNOSIS — G40109 Localization-related (focal) (partial) symptomatic epilepsy and epileptic syndromes with simple partial seizures, not intractable, without status epilepticus: Secondary | ICD-10-CM

## 2021-10-06 DIAGNOSIS — Z2821 Immunization not carried out because of patient refusal: Secondary | ICD-10-CM

## 2021-10-06 DIAGNOSIS — G8929 Other chronic pain: Secondary | ICD-10-CM

## 2021-10-06 DIAGNOSIS — J310 Chronic rhinitis: Secondary | ICD-10-CM

## 2021-10-06 DIAGNOSIS — Z1211 Encounter for screening for malignant neoplasm of colon: Secondary | ICD-10-CM

## 2021-10-06 LAB — POCT GLYCOSYLATED HEMOGLOBIN (HGB A1C)
HbA1c, POC (controlled diabetic range): 6.4 % (ref 0.0–7.0)
HbA1c, POC (prediabetic range): 6.4 % (ref 5.7–6.4)

## 2021-10-06 MED ORDER — CETIRIZINE HCL 10 MG PO TABS: ORAL_TABLET | ORAL | 0 refills | Status: DC

## 2021-10-06 MED ORDER — OZEMPIC (1 MG/DOSE) 4 MG/3ML ~~LOC~~ SOPN: PEN_INJECTOR | SUBCUTANEOUS | 3 refills | Status: DC

## 2021-10-06 MED ORDER — POLYETHYLENE GLYCOL 3350 17 GM/SCOOP PO POWD: 17.0000 g | Freq: Two times a day (BID) | ORAL | 1 refills | Status: DC | PRN

## 2021-10-06 NOTE — Assessment & Plan Note (Addendum)
Lab Results  Component Value Date   HGBA1C 6.4 10/06/2021   HGBA1C 6.4 10/06/2021   Well-controlled now with Ozempic 1 mg qw Advised to follow diabetic diet On statin F/u CMP and lipid panel Diabetic eye exam: Advised to follow up with Ophthalmology for diabetic eye exam

## 2021-10-06 NOTE — Patient Instructions (Addendum)
Please continue to take medications as prescribed.  Please continue to follow low carb diet and ambulate as tolerated.  Please consider getting Shingrix vaccine at your local pharmacy.  Please get fasting blood tests done before the next visit.

## 2021-10-06 NOTE — Assessment & Plan Note (Signed)
Has type II DM, HLD and HTN Diet modification and moderate exercise/walking as tolerated On GLP-1 agonist therapy for type II DM

## 2021-10-06 NOTE — Assessment & Plan Note (Signed)
Likely migraine On propranolol for ppx Followed by neurology

## 2021-10-06 NOTE — Assessment & Plan Note (Signed)
BP Readings from Last 1 Encounters:  10/06/21 138/86   Well-controlled with Amlodipine and Propranolol Counseled for compliance with the medications Advised DASH diet and moderate exercise/walking, at least 150 mins/week

## 2021-10-06 NOTE — Assessment & Plan Note (Signed)
On statin Check lipid profile 

## 2021-10-06 NOTE — Assessment & Plan Note (Signed)
Could be due to multiple meds Needs to improve hydration MiraLAX as needed Advised to take cranberry juice as needed

## 2021-10-06 NOTE — Assessment & Plan Note (Signed)
Takes Trileptal Followed by Neurology 

## 2021-10-06 NOTE — Progress Notes (Signed)
Established Patient Office Visit  Subjective:  Patient ID: Amanda Webster, female    DOB: 06-14-1961  Age: 61 y.o. MRN: [id number]  CC:  Chief Complaint  Patient presents with   Follow-up    3 month follow up HTN and DM pt has been having lower back pain on and off for awhile in her lower back but when it happens this puts her down for about 3 days    HPI Amanda Webster is a 61 y.o. female with past medical history of HTN, type II DM with HLD, polyarthritis, migraine, seizure disorder, allergic rhinitis and morbid obesity who presents for f/u of her chronic medical conditions.  HTN: BP is well-controlled.  She takes amlodipine and propranolol.  Chart review suggests history of seizure disorder/tremors and migraine, for which she might be taking propranolol.  Patient denies headache, dizziness, chest pain, dyspnea or palpitations.   Type II DM with HLD: Her HbA1C was 6.4 in the office today.  She takes Ozempic 1 mg every week.  Her blood glucose has been around 150 and less most of the time.  She feels mild fatigued as she states that she is " used to have very high blood glucose" -usually more than 250 in the past. She denies any polyuria or polydipsia. She takes Lipitor as well.  She sees [facility] for h/o seizure disorder and migraine.  She requests a new referral to be sent-new referral provided.  She c/o chronic low back pain, which is intermittent.  She has tried taking kratom for low back pain, which has improved her pain.  She denies any recent injury or fall.  She also takes gabapentin for neuropathic pain and seizures.  She also c/o chronic constipation, which worsens her low back pain.  She denies any melena or hematochezia currently.  Past Medical History:  Diagnosis Date   Allergy    Anxiety    Arnold-Chiari malformation (HCC)    Arthritis    Borderline diabetic    Chronic headaches    Colon polyps    nonadenomatous   Depression    Diabetes mellitus  without complication ([place])    Diverticulosis    Esophageal stricture    FROZEN LEFT SHOULDER 04/01/2010   Qualifier: Diagnosis of  By: Aline Brochure MD, Dorothyann Peng     GERD (gastroesophageal reflux disease)    HTN (hypertension)    Hyperlipidemia    IBS (irritable bowel syndrome)    Seizures ([place])    pt not sure when her last seizure was- she says she just spaces out- no shaking but she could not tell me last seizure-  on Oxtellar bid for seizures    Sleep apnea    no cpap now- did use 02 with cpap but has not used either in "years"    Past Surgical History:  Procedure Laterality Date   BREAST BIOPSY Left    cahri decompression  6 and 03/1999   CHOLECYSTECTOMY     COLONOSCOPY     invasive cervical traction  02/2009   left breast-lumpectomy     POLYPECTOMY     shunt put in on right side of brain     SHUNT REPLACEMENT  12/10   TOTAL ABDOMINAL HYSTERECTOMY  Age 33 yrs   TAH & BSO   TUBAL LIGATION     UPPER GASTROINTESTINAL ENDOSCOPY      Family History  Problem Relation Age of Onset   Colon polyps Mother    Breast cancer Mother  Lung cancer Mother    Heart disease Father    Stroke Father    Aortic aneurysm Father    Chiari malformation Daughter    Addison's disease Son    Breast cancer Maternal Aunt    Colon cancer Neg Hx    Esophageal cancer Neg Hx    Rectal cancer Neg Hx    Stomach cancer Neg Hx     Social History   Socioeconomic History   Marital status: Married    Spouse name: Not on file   Number of children: Not on file   Years of education: Not on file   Highest education level: Not on file  Occupational History   Not on file  Tobacco Use   Smoking status: Former    Packs/day: 1.50    Years: 30.00    Pack years: 45.00    Types: Cigarettes    Quit date: 09/13/1990    Years since quitting: 31.0   Smokeless tobacco: Never  Vaping Use   Vaping Use: Never used  Substance and Sexual Activity   Alcohol use: No   Drug use: No   Sexual activity: Not Currently     Birth control/protection: Surgical    Comment: hyst  Other Topics Concern   Not on file  Social History Narrative   Disabled from Chiari malformation/seizures.. Drinks 4-5 cups of tea daily. Does not get regular exercise. Married for 40 years.   Social Determinants of Health   Financial Resource Strain: Not on file  Food Insecurity: Not on file  Transportation Needs: Not on file  Physical Activity: Not on file  Stress: Not on file  Social Connections: Not on file  Intimate Partner Violence: Not on file    Outpatient Medications Prior to Visit  Medication Sig Dispense Refill   Accu-Chek Softclix Lancets lancets 1 each by Other route daily. as directed 100 each 3   acetaminophen (TYLENOL) 80 MG chewable tablet Chew 80 mg by mouth every 6 (six) hours as needed.     amLODipine (NORVASC) 10 MG tablet TAKE 1 TABLET EVERY DAY 90 tablet 1   atorvastatin (LIPITOR) 20 MG tablet Take 1 tablet (20 mg total) by mouth daily. 90 tablet 0   azelastine (ASTELIN) 0.1 % nasal spray Place 1 spray into both nostrils daily at 12 noon. 30 mL 2   fluticasone (FLONASE) 50 MCG/ACT nasal spray Place 1 spray into both nostrils daily. 16 g 2   gabapentin (NEURONTIN) 300 MG capsule Take 600 mg by mouth at bedtime.     glucose blood (ACCU-CHEK AVIVA PLUS) test strip 1 each by Other route daily. Use as instructed 100 strip 3   methocarbamol (ROBAXIN) 500 MG tablet Take 500 mg by mouth every 6 (six) hours as needed.     naproxen sodium (ALEVE) 220 MG tablet Take 440 mg by mouth daily as needed (for pain).     Oxcarbazepine (TRILEPTAL) 300 MG tablet Take 300 mg by mouth 2 (two) times daily.     propranolol (INDERAL) 60 MG tablet TAKE 1 TABLET TWICE DAILY 180 tablet 0   UNABLE TO FIND Aleve pain relieving lotion-daily     VITAMIN D PO Take 10,000 Units by mouth daily.     cetirizine (ZYRTEC) 10 MG tablet TAKE 1 TABLET BY MOUTH EVERY DAY 90 tablet 0   OZEMPIC, 1 MG/DOSE, 4 MG/3ML SOPN ADMINISTER 1 MG UNDER THE  SKIN 1 TIME A WEEK 3 mL 3   No facility-administered medications prior to visit.  Allergies  Allergen Reactions   Cymbalta [Duloxetine Hcl] Itching    Nasal itching   Methocarbamol    Propoxyphene N-Acetaminophen Itching   Sulfa Antibiotics     ROS Review of Systems  Constitutional:  Negative for chills and fever.  HENT:  Negative for congestion, sinus pressure and sinus pain.   Eyes:  Negative for pain and discharge.  Respiratory:  Negative for cough and shortness of breath.   Cardiovascular:  Negative for chest pain and palpitations.  Gastrointestinal:  Negative for diarrhea, nausea and vomiting.  Genitourinary:  Negative for dysuria and hematuria.  Musculoskeletal:  Positive for arthralgias, back pain and gait problem. Negative for neck pain and neck stiffness.  Skin:  Negative for rash.  Neurological:  Negative for dizziness and weakness.  Psychiatric/Behavioral:  Negative for agitation.      Objective:    Physical Exam Vitals reviewed.  Constitutional:      General: She is not in acute distress.    Appearance: She is obese. She is not diaphoretic.  HENT:     Head: Normocephalic and atraumatic.     Nose: Nose normal.     Mouth/Throat:     Mouth: Mucous membranes are moist.  Eyes:     General: No scleral icterus.    Extraocular Movements: Extraocular movements intact.  Cardiovascular:     Rate and Rhythm: Normal rate and regular rhythm.     Pulses: Normal pulses.     Heart sounds: Normal heart sounds. No murmur heard. Pulmonary:     Breath sounds: Normal breath sounds. No wheezing or rales.  Musculoskeletal:     Cervical back: Neck supple. No tenderness.     Right lower leg: No edema.     Left lower leg: No edema.  Skin:    General: Skin is warm.     Findings: No rash.  Neurological:     General: No focal deficit present.     Mental Status: She is alert and oriented to person, place, and time.     Sensory: No sensory deficit.     Motor: No weakness.   Psychiatric:        Mood and Affect: Mood normal.        Behavior: Behavior normal.    BP 138/86 (BP Location: Left Arm, Patient Position: Sitting, Cuff Size: Normal)    Pulse 66    Resp 18    Ht 5' 2" (1.575 m)    Wt 193 lb (87.5 kg)    SpO2 98%    BMI 35.30 kg/m  Wt Readings from Last 3 Encounters:  10/06/21 193 lb (87.5 kg)  07/06/21 198 lb 1.3 oz (89.8 kg)  06/09/21 192 lb 12.8 oz (87.5 kg)    Lab Results  Component Value Date   TSH 2.270 07/07/2021   Lab Results  Component Value Date   WBC 5.6 07/07/2021   HGB 14.0 07/07/2021   HCT 41.2 07/07/2021   MCV 91 07/07/2021   PLT 178 07/07/2021   Lab Results  Component Value Date   NA 137 07/07/2021   K 4.7 07/07/2021   CO2 27 07/07/2021   GLUCOSE 144 (H) 07/07/2021   BUN 7 07/07/2021   CREATININE 0.71 07/07/2021   BILITOT 0.2 07/07/2021   ALKPHOS 158 (H) 07/07/2021   AST 18 07/07/2021   ALT 23 07/07/2021   PROT 6.6 07/07/2021   ALBUMIN 4.7 07/07/2021   CALCIUM 9.9 07/07/2021   ANIONGAP 8 03/25/2021   EGFR 98 07/07/2021  Lab Results  Component Value Date   CHOL 169 07/07/2021   Lab Results  Component Value Date   HDL 51 07/07/2021   Lab Results  Component Value Date   LDLCALC 90 07/07/2021   Lab Results  Component Value Date   TRIG 160 (H) 07/07/2021   Lab Results  Component Value Date   CHOLHDL 3.3 07/07/2021   Lab Results  Component Value Date   HGBA1C 6.4 10/06/2021   HGBA1C 6.4 10/06/2021      Assessment & Plan:   Problem List Items Addressed This Visit       Cardiovascular and Mediastinum   Essential hypertension    BP Readings from Last 1 Encounters:  10/06/21 138/86  Well-controlled with Amlodipine and Propranolol Counseled for compliance with the medications Advised DASH diet and moderate exercise/walking, at least 150 mins/week        Digestive   Chronic idiopathic constipation    Could be due to multiple meds Needs to improve hydration MiraLAX as needed Advised to  take cranberry juice as needed      Relevant Medications   polyethylene glycol powder (GLYCOLAX/MIRALAX) 17 GM/SCOOP powder     Endocrine   Diabetes mellitus ([place]) - Primary    Lab Results  Component Value Date   HGBA1C 6.4 10/06/2021   HGBA1C 6.4 10/06/2021  Well-controlled now with Ozempic 1 mg qw Advised to follow diabetic diet On statin F/u CMP and lipid panel Diabetic eye exam: Advised to follow up with Ophthalmology for diabetic eye exam      Relevant Medications   Semaglutide, 1 MG/DOSE, (OZEMPIC, 1 MG/DOSE,) 4 MG/3ML SOPN   Other Relevant Orders   Hemoglobin A1c   CMP14+EGFR   POCT glycosylated hemoglobin (Hb A1C) (Completed)   Hyperlipidemia associated with type 2 diabetes mellitus (HCC)    On statin Check lipid profile      Relevant Medications   Semaglutide, 1 MG/DOSE, (OZEMPIC, 1 MG/DOSE,) 4 MG/3ML SOPN   Other Relevant Orders   Lipid Profile     Nervous and Auditory   Localization-related epilepsy ([place])    Takes Trileptal Followed by Neurology      Relevant Orders   Ambulatory referral to Neurology     Other   Chronic intractable headache    Likely migraine On propranolol for ppx Followed by neurology      Relevant Orders   Ambulatory referral to Neurology   Morbid obesity ([place])    Has type II DM, HLD and HTN Diet modification and moderate exercise/walking as tolerated On GLP-1 agonist therapy for type II DM      Relevant Medications   Semaglutide, 1 MG/DOSE, (OZEMPIC, 1 MG/DOSE,) 4 MG/3ML SOPN   Other Visit Diagnoses     Influenza vaccination declined       Special screening for malignant neoplasm of colon       Relevant Orders   Ambulatory referral to Gastroenterology   Encounter for screening for HIV       Relevant Orders   HIV antibody (with reflex)       Meds ordered this encounter  Medications   Semaglutide, 1 MG/DOSE, (OZEMPIC, 1 MG/DOSE,) 4 MG/3ML SOPN    Sig: ADMINISTER 1 MG UNDER THE SKIN 1 TIME A WEEK    Dispense:   3 mL    Refill:  3   polyethylene glycol powder (GLYCOLAX/MIRALAX) 17 GM/SCOOP powder    Sig: Take 17 g by mouth 2 (two) times daily as needed.    Dispense:  3350 g    Refill:  1    Follow-up: Return in about 4 months (around 02/03/2022) for Annual physical.    [name], MD

## 2021-11-04 ENCOUNTER — Ambulatory Visit: Payer: Medicare PPO | Admitting: Internal Medicine

## 2021-11-04 ENCOUNTER — Encounter: Payer: Self-pay | Admitting: Internal Medicine

## 2021-11-04 VITALS — BP 148/84 | HR 62 | Ht 62.0 in | Wt 196.2 lb

## 2021-11-04 DIAGNOSIS — Z86010 Personal history of colonic polyps: Secondary | ICD-10-CM

## 2021-11-04 DIAGNOSIS — K219 Gastro-esophageal reflux disease without esophagitis: Secondary | ICD-10-CM

## 2021-11-04 DIAGNOSIS — K5901 Slow transit constipation: Secondary | ICD-10-CM

## 2021-11-04 MED ORDER — SUTAB 1479-225-188 MG PO TABS: 1.0000 | ORAL_TABLET | Freq: Once | ORAL | 0 refills | Status: AC

## 2021-11-04 NOTE — Patient Instructions (Signed)
If you are age 59 or older, your body mass index should be between 23-30. Your Body mass index is 35.89 kg/m. If this is out of the aforementioned range listed, please consider follow up with your Primary Care Provider.  If you are age 53 or younger, your body mass index should be between 19-25. Your Body mass index is 35.89 kg/m. If this is out of the aformentioned range listed, please consider follow up with your Primary Care Provider.   ________________________________________________________  The [facility] providers would like to encourage you to use [facility] to communicate with providers for non-urgent requests or questions.  Due to long hold times on the telephone, sending your provider a message by [facility] may be a faster and more efficient way to get a response.  Please allow 48 business hours for a response.  Please remember that this is for non-urgent requests.  _______________________________________________________  Amanda Webster have been scheduled for a colonoscopy. Please follow written instructions given to you at your visit today.  Please pick up your prep supplies at the pharmacy within the next 1-3 days. If you use inhalers (even only as needed), please bring them with you on the day of your procedure.

## 2021-11-04 NOTE — Progress Notes (Signed)
HISTORY OF PRESENT ILLNESS:  Amanda Webster is a 61 y.o. female with multiple medical problems as listed below.  She presents today with a chief complaint of constipation, back pain, and the need for surveillance colonoscopy.  She is known to have irritable bowel syndrome, GERD, and prior cholecystectomy with negative ERCP.  She was last seen in this office April 2018 regarding aerophagia with associated belching and GERD.  She has undergone prior colonoscopy in 2003, 2014, and November 2019.  She has a history of sessile serrated colon polyp.  Her most recent examination revealed 4 tubular adenomas for which follow-up in 3 years recommended.  She was also noted to have left-sided diverticulosis and incidental right-sided lipoma.  Review of blood work from October 2022 shows unremarkable comprehensive metabolic panel (alkaline phosphatase 158) and CBC with hemoglobin 14.0.  Patient tells me that about 3 months ago she noticed a change in her bowel habits.  Previously she was able to have a bowel movement daily.  Since that time she has had a bowel movement every second or third day.  If she goes 3 days without moving her bowels she notices that she has low back pain.  This does improve somewhat after she is able to defecate.  She is known to have chronic back pain.  She denies bleeding.  She does have occasional abdominal cramping prior to bowel movements.  Her PCP recommended MiraLAX for constipation.  This has not been started.  She does take OTC agents for her reflux.  No dysphagia.  REVIEW OF SYSTEMS:  All non-GI ROS negative unless otherwise stated in the HPI except for sinus and allergy, arthritis, back pain, visual change, fatigue, urinary leakage, excessive urination, swelling of the feet, sore throat  Past Medical History:  Diagnosis Date   Allergy    Anxiety    Arnold-Chiari malformation (HCC)    Arthritis    Borderline diabetic    Chronic headaches    Colon polyps    nonadenomatous    Depression    Diabetes mellitus without complication ([place])    Diverticulosis    Esophageal stricture    FROZEN LEFT SHOULDER 04/01/2010   Qualifier: Diagnosis of  By: Aline Brochure MD, Dorothyann Peng     GERD (gastroesophageal reflux disease)    HTN (hypertension)    Hyperlipidemia    IBS (irritable bowel syndrome)    Seizures ([place])    pt not sure when her last seizure was- she says she just spaces out- no shaking but she could not tell me last seizure-  on Oxtellar bid for seizures    Sleep apnea    no cpap now- did use 02 with cpap but has not used either in "years"    Past Surgical History:  Procedure Laterality Date   BREAST BIOPSY Left    cahri decompression  6 and 03/1999   CHOLECYSTECTOMY     COLONOSCOPY     invasive cervical traction  02/2009   left breast-lumpectomy     POLYPECTOMY     shunt put in on right side of brain     SHUNT REPLACEMENT  12/10   TOTAL ABDOMINAL HYSTERECTOMY  Age 89 yrs   [place]  reports that she quit smoking about 31 years ago. Her smoking use included cigarettes. She has a 45.00 pack-year smoking history. She has never used smokeless tobacco. She  reports that she does not drink alcohol and does not use drugs.  family history includes Addison's disease in her son; Aortic aneurysm in her father; Breast cancer in her maternal aunt and mother; Chiari malformation in her daughter; Colon polyps in her mother; Heart disease in her father; Lung cancer in her mother; Stroke in her father.  Allergies  Allergen Reactions   Cymbalta [Duloxetine Hcl] Itching    Nasal itching   Methocarbamol    Propoxyphene N-Acetaminophen Itching   Sulfa Antibiotics        PHYSICAL EXAMINATION: Vital signs: BP (!) 148/84    Pulse 62    Ht 5\' 2"  (1.575 m)    Wt 196 lb 4 oz (89 kg)    BMI 35.89 kg/m   Constitutional: generally well-appearing, no acute distress Psychiatric: alert  and oriented x3, cooperative Eyes: extraocular movements intact, anicteric, conjunctiva pink Mouth: oral pharynx moist, no lesions Neck: supple no lymphadenopathy Cardiovascular: heart regular rate and rhythm, no murmur Lungs: clear to auscultation bilaterally Abdomen: soft, obese, nontender, nondistended, no obvious ascites, no peritoneal signs, normal bowel sounds, no organomegaly Rectal: Deferred to colonoscopy Extremities: no clubbing or cyanosis.  1+ lower extremity edema bilaterally Skin: no lesions on visible extremities Neuro: No focal deficits. No asterixis.     ASSESSMENT:  1.  New onset worsening constipation.  Slow transit likely. 2.  History of sessile serrated polyp and multiple adenomatous colon polyps.  Due for surveillance 3.  Multiple medical problems including obesity and diabetes mellitus   PLAN:  1.  MiraLAX.  1 dose daily.  Titrate to need.  Reviewed. 2.  Schedule surveillance colonoscopy to evaluate change in bowel habits and provide colorectal neoplasia surveillance, which is due.  Patient is higher than baseline risk due to her body habitus and comorbidities.The nature of the procedure, as well as the risks, benefits, and alternatives were carefully and thoroughly reviewed with the patient. Ample time for discussion and questions allowed. The patient understood, was satisfied, and agreed to proceed.  3.  Reflux precautions with attention to weight loss 4.  On-demand acid reducing agents to address symptomatic periods of reflux.

## 2021-11-16 DIAGNOSIS — M545 Low back pain, unspecified: Secondary | ICD-10-CM | POA: Diagnosis not present

## 2021-11-16 DIAGNOSIS — E669 Obesity, unspecified: Secondary | ICD-10-CM | POA: Diagnosis not present

## 2021-11-16 DIAGNOSIS — R42 Dizziness and giddiness: Secondary | ICD-10-CM | POA: Diagnosis not present

## 2021-11-16 DIAGNOSIS — G43711 Chronic migraine without aura, intractable, with status migrainosus: Secondary | ICD-10-CM | POA: Diagnosis not present

## 2021-11-16 DIAGNOSIS — G40009 Localization-related (focal) (partial) idiopathic epilepsy and epileptic syndromes with seizures of localized onset, not intractable, without status epilepticus: Secondary | ICD-10-CM | POA: Diagnosis not present

## 2021-11-16 DIAGNOSIS — I10 Essential (primary) hypertension: Secondary | ICD-10-CM | POA: Diagnosis not present

## 2021-11-16 DIAGNOSIS — F5101 Primary insomnia: Secondary | ICD-10-CM | POA: Diagnosis not present

## 2021-12-03 ENCOUNTER — Ambulatory Visit (INDEPENDENT_AMBULATORY_CARE_PROVIDER_SITE_OTHER): Payer: Medicare PPO | Admitting: Nurse Practitioner

## 2021-12-07 ENCOUNTER — Ambulatory Visit (INDEPENDENT_AMBULATORY_CARE_PROVIDER_SITE_OTHER): Payer: Medicare PPO

## 2021-12-07 ENCOUNTER — Other Ambulatory Visit: Payer: Self-pay

## 2021-12-07 ENCOUNTER — Encounter: Payer: Self-pay | Admitting: Cardiology

## 2021-12-07 ENCOUNTER — Ambulatory Visit: Payer: Medicare PPO | Admitting: Cardiology

## 2021-12-07 VITALS — BP 110/70 | HR 64 | Ht 62.0 in | Wt 197.6 lb

## 2021-12-07 DIAGNOSIS — R0602 Shortness of breath: Secondary | ICD-10-CM

## 2021-12-07 DIAGNOSIS — I10 Essential (primary) hypertension: Secondary | ICD-10-CM

## 2021-12-07 DIAGNOSIS — R0609 Other forms of dyspnea: Secondary | ICD-10-CM

## 2021-12-07 DIAGNOSIS — Z0000 Encounter for general adult medical examination without abnormal findings: Secondary | ICD-10-CM | POA: Diagnosis not present

## 2021-12-07 DIAGNOSIS — R002 Palpitations: Secondary | ICD-10-CM

## 2021-12-07 DIAGNOSIS — R600 Localized edema: Secondary | ICD-10-CM | POA: Diagnosis not present

## 2021-12-07 MED ORDER — PROPRANOLOL HCL 80 MG PO TABS: 80.0000 mg | ORAL_TABLET | Freq: Every evening | ORAL | 3 refills | Status: DC

## 2021-12-07 MED ORDER — PROPRANOLOL HCL 60 MG PO TABS: 60.0000 mg | ORAL_TABLET | Freq: Every morning | ORAL | 3 refills | Status: DC

## 2021-12-07 NOTE — Progress Notes (Signed)
? ?Subjective:  ? Amanda Webster is a 61 y.o. female who presents for Medicare Annual (Subsequent) preventive examination. ?I connected with  [place] on 12/07/21 by a audio enabled telemedicine application and verified that I am speaking with the correct person using two identifiers. ? ?Patient Location: Home ? ?Provider Location: Office/Clinic ? ?I discussed the limitations of evaluation and management by telemedicine. The patient expressed understanding and agreed to proceed.  ?Review of Systems    ? ?Cardiac Risk Factors include: diabetes mellitus;hypertension ? ?   ?Objective:  ?  ?There were no vitals filed for this visit. ?There is no height or weight on file to calculate BMI. ? ? ?  12/07/2021  ?  2:39 PM 07/20/2016  ?  3:46 PM 07/18/2016  ?  5:02 AM  ?Advanced Directives  ?Does Patient Have a Medical Advance Directive? No No No  ?Would patient like information on creating a medical advance directive? Yes (ED - Information included in AVS)    ? ? ?Current Medications (verified) ?Outpatient Encounter Medications as of 12/07/2021  ?Medication Sig  ? Accu-Chek Softclix Lancets lancets 1 each by Other route daily. as directed  ? AIMOVIG 70 MG/ML SOAJ Inject into the skin every 30 (thirty) days.  ? amLODipine (NORVASC) 10 MG tablet TAKE 1 TABLET EVERY DAY  ? atorvastatin (LIPITOR) 20 MG tablet Take 1 tablet (20 mg total) by mouth daily.  ? cetirizine (ZYRTEC) 10 MG tablet TAKE 1 TABLET BY MOUTH EVERY DAY  ? fluticasone (FLONASE) 50 MCG/ACT nasal spray Place 1 spray into both nostrils daily.  ? gabapentin (NEURONTIN) 300 MG capsule Take 600 mg by mouth at bedtime.  ? glucose blood (ACCU-CHEK AVIVA PLUS) test strip 1 each by Other route daily. Use as instructed  ? naproxen sodium (ALEVE) 220 MG tablet Take 440 mg by mouth daily as needed (for pain).  ? Oxcarbazepine (TRILEPTAL) 300 MG tablet Take 300 mg by mouth 2 (two) times daily.  ? polyethylene glycol powder (GLYCOLAX/MIRALAX) 17 GM/SCOOP powder Take  17 g by mouth 2 (two) times daily as needed.  ? propranolol (INDERAL) 60 MG tablet TAKE 1 TABLET TWICE DAILY  ? Semaglutide, 1 MG/DOSE, (OZEMPIC, 1 MG/DOSE,) 4 MG/3ML SOPN ADMINISTER 1 MG UNDER THE SKIN 1 TIME A WEEK  ? UNABLE TO FIND Aleve pain relieving lotion-daily  ? VITAMIN D PO Take 10,000 Units by mouth daily.  ? azelastine (ASTELIN) 0.1 % nasal spray Place 1 spray into both nostrils daily at 12 noon. (Patient not taking: Reported on 12/07/2021)  ? ?No facility-administered encounter medications on file as of 12/07/2021.  ? ? ?Allergies (verified) ?Cymbalta [duloxetine hcl], Methocarbamol, Propoxyphene n-acetaminophen, and Sulfa antibiotics  ? ?History: ?Past Medical History:  ?Diagnosis Date  ? Allergy   ? Anxiety   ? Arnold-Chiari malformation ([place])   ? Arthritis   ? Borderline diabetic   ? Chronic headaches   ? Colon polyps   ? nonadenomatous  ? Depression   ? Diabetes mellitus without complication ([place])   ? Diverticulosis   ? Esophageal stricture   ? FROZEN LEFT SHOULDER 04/01/2010  ? Qualifier: Diagnosis of  By: Aline Brochure MD, Dorothyann Peng    ? GERD (gastroesophageal reflux disease)   ? HTN (hypertension)   ? Hyperlipidemia   ? IBS (irritable bowel syndrome)   ? Seizures ([place])   ? pt not sure when her last seizure was- she says she just spaces out- no shaking but she could not tell me last seizure-  on Oxtellar bid for seizures   ? Sleep apnea   ? no cpap now- did use 02 with cpap but has not used either in "years"  ? ?Past Surgical History:  ?Procedure Laterality Date  ? BREAST BIOPSY Left   ? cahri decompression  8 and 03/1999  ? CHOLECYSTECTOMY    ? COLONOSCOPY    ? invasive cervical traction  02/2009  ? left breast-lumpectomy    ? POLYPECTOMY    ? shunt put in on right side of brain    ? SHUNT REPLACEMENT  12/10  ? TOTAL ABDOMINAL HYSTERECTOMY  Age 8 yrs  ? TAH & BSO  ? TUBAL LIGATION    ? UPPER GASTROINTESTINAL ENDOSCOPY    ? ?Family History  ?Problem Relation Age of Onset  ? Colon polyps Mother   ? Breast  cancer Mother   ? Lung cancer Mother   ? Heart disease Father   ? Stroke Father   ? Aortic aneurysm Father   ? Chiari malformation Daughter   ? Addison's disease Son   ? Breast cancer Maternal Aunt   ? Colon cancer Neg Hx   ? Esophageal cancer Neg Hx   ? Rectal cancer Neg Hx   ? Stomach cancer Neg Hx   ? ?Social History  ? ?Socioeconomic History  ? Marital status: Married  ?  Spouse name: Not on file  ? Number of children: Not on file  ? Years of education: Not on file  ? Highest education level: Not on file  ?Occupational History  ? Not on file  ?Tobacco Use  ? Smoking status: Former  ?  Packs/day: 1.50  ?  Years: 30.00  ?  Pack years: 45.00  ?  Types: Cigarettes  ?  Quit date: 09/13/1990  ?  Years since quitting: 31.2  ? Smokeless tobacco: Never  ?Vaping Use  ? Vaping Use: Never used  ?Substance and Sexual Activity  ? Alcohol use: No  ? Drug use: No  ? Sexual activity: Not Currently  ?  Birth control/protection: Surgical  ?  Comment: hyst  ?Other Topics Concern  ? Not on file  ?Social History Narrative  ? Disabled from Chiari malformation/seizures.. Drinks 4-5 cups of tea daily. Does not get regular exercise. Married for 40 years.  ? ?Social Determinants of Health  ? ?Financial Resource Strain: Not on file  ?Food Insecurity: Not on file  ?Transportation Needs: Not on file  ?Physical Activity: Not on file  ?Stress: Not on file  ?Social Connections: Not on file  ? ? ?Tobacco Counseling ?Counseling given: Not Answered ? ? ?Clinical Intake: ? ?  ? ?  ? ?  ? ?Diabetes: Yes ?CBG done?: No ?Did pt. bring in CBG monitor from home?: No ? ?How often do you need to have someone help you when you read instructions, pamphlets, or other written materials from your doctor or pharmacy?: 2 - Rarely ? ?Diabetic?yes ?Nutrition Risk Assessment: ? ?Has the patient had any N/V/D within the last 2 months?  Yes  ?Does the patient have any non-healing wounds?  No  ?Has the patient had any unintentional weight loss or weight gain?  No   ? ?Diabetes: ? ?Is the patient diabetic?  Yes  ?If diabetic, was a CBG obtained today?  No  ?Did the patient bring in their glucometer from home?   ?How often do you monitor your CBG's? Daily but she forgets sometimes.  ? ?Financial Strains and Diabetes Management: ? ?Are you having any  financial strains with the device, your supplies or your medication? No .  ?Does the patient want to be seen by Chronic Care Management for management of their diabetes?  No  ?Would the patient like to be referred to a Nutritionist or for Diabetic Management?  No  ? ?Diabetic Exams: ? ?Diabetic Eye Exam: Completed 06/03/21 ?Diabetic Foot Exam: Completed 10/06/21   ?  ? ?  ? ? ?Activities of Daily Living ? ?  12/07/2021  ?  2:39 PM  ?In your present state of health, do you have any difficulty performing the following activities:  ?Hearing? 1  ?Vision? 1  ?Difficulty concentrating or making decisions? 1  ?Comment concentrating  ?Walking or climbing stairs? 1  ?Comment some days, has to take her time  ?Dressing or bathing? 0  ?Doing errands, shopping? 0  ?Preparing Food and eating ? N  ?Using the Toilet? N  ?In the past six months, have you accidently leaked urine? Y  ?Do you have problems with loss of bowel control? N  ?Managing your Medications? N  ?Managing your Finances? N  ?Housekeeping or managing your Housekeeping? N  ? ? ?Patient Care Team: ?[name], MD as PCP - General (Internal Medicine) ?Arnoldo Lenis, MD as PCP - Cardiology (Cardiology) ?Atilano Ina, MD as Referring Physician (Neurosurgery) ? ?Indicate any recent Medical Services you may have received from other than [facility] providers in the past year (date may be approximate). ? ?   ?Assessment:  ? This is a routine wellness examination for Amanda Webster. ? ?Hearing/Vision screen ?No results found. ? ?Dietary issues and exercise activities discussed: ?Current Exercise Habits: The patient does not participate in regular exercise at present, Exercise limited by:  orthopedic condition(s) ? ? Goals Addressed   ? ?  ?  ?  ?  ? This Visit's Progress  ?  Patient Stated     ?  Patient states that her main goal to be able to be outside and work in her flowers ?  ? ?  ? ?Depression

## 2021-12-07 NOTE — Patient Instructions (Addendum)
Medication Instructions:  ?Change your Propranolol to '60mg'$  every morning & '80mg'$  every evening.  ?Continue all other medications.    ? ?Labwork: ?none ? ?Testing/Procedures: ?Your physician has requested that you have an echocardiogram. Echocardiography is a painless test that uses sound waves to create images of your heart. It provides your doctor with information about the size and shape of your heart and how well your heart?s chambers and valves are working. This procedure takes approximately one hour. There are no restrictions for this procedure.  ?Office will contact with results via phone or letter.    ? ?Follow-Up: ?6 months  ? ?Any Other Special Instructions Will Be Listed Below (If Applicable). ? ? ?If you need a refill on your cardiac medications before your next appointment, please call your pharmacy. ? ?

## 2021-12-07 NOTE — Patient Instructions (Signed)
?  Amanda Webster , ?Thank you for taking time to come for your Medicare Wellness Visit. I appreciate your ongoing commitment to your health goals. Please review the following plan we discussed and let me know if I can assist you in the future.  ? ?These are the goals we discussed: ? Goals   ? ?  Patient Stated   ?  Patient states that her main goal to be able to be outside and work in her flowers ?  ? ?  ?  ?This is a list of the screening recommended for you and due dates:  ?Health Maintenance  ?Topic Date Due  ? COVID-19 Vaccine (1) Never done  ? Zoster (Shingles) Vaccine (1 of 2) Never done  ? Colon Cancer Screening  08/09/2021  ? Urine Protein Check  10/30/2021  ? Flu Shot  12/11/2021*  ? Hemoglobin A1C  04/05/2022  ? Eye exam for diabetics  06/03/2022  ? Complete foot exam   10/06/2022  ? Mammogram  01/27/2023  ? Tetanus Vaccine  07/29/2030  ? Hepatitis C Screening: USPSTF Recommendation to screen - Ages 97-79 yo.  Completed  ? HIV Screening  Completed  ? HPV Vaccine  Aged Out  ? Pap Smear  Discontinued  ?*Topic was postponed. The date shown is not the original due date.  ?  ?

## 2021-12-07 NOTE — Progress Notes (Signed)
? ? ? ?Clinical Summary ?[name] is a 61 y.o.female seen today for follow up of the following medical problems.  ?  ?1. Chest pain ?  ?From prior visit:  ?- seen in ER 07/18/16 with CP ?- thought to be chestwall pain from bronchitis, coughing. Given z-pak. Prilosec given for GERD symptoms.  ? - pain started several months ago. Started a pressure, midchest. 5-6/10 in severity. Can have some diaphoresis. Can have some SOB. Occurs several times a week. Not positional. No relation to food. Symptoms can last up to 1 hour. Can occur at anytime. ?- can have frequent belching ?- DOE at 1 block, which is stable.  ? - CAD risk factors: DM2, previous history of HTN now off meds, ?HL, +tobacco in the past about 18 years total, father MIs late 7s to early 40s ?- cannot run on treadmill due to chronic back pain.  ? - normal stress test 08/2016 ?  ?- no recent symptoms ?  ?  ? 2.Palpitaitons ?- recently symptoms of chest pains, palpitations.  ?- + palpitations, pressure, +SOB. Typically occurs at night ?- Lasts 5-10 minutes. Can awake her from sleep ?- sedentary lifestyle.  ?- pepsi 16 oz per day, no coffee, no tea, no energy drinks, no EtOH.  ?  ?  ?10/2020 event monitor: rare ectopy isolated 4 beat run of SVT ?-still palpitations at night, does ok during the days.  ? ? ?  ?3. OSA ?- does not wear cpap due to discomfort ?  ? ?  ?4. HTN ?- she is compliantwith meds ?  ?5. Hyperlipidemia ?-06/2021 TC 169 TG 160 HDL 51 LDL 90 ? ?6. LE edema ? Reprots recenty bilateral LE edema, mild DOE ? ? ? ? ?On propranolol, history of migraines ?  ? ? ?Past Medical History:  ?Diagnosis Date  ? Allergy   ? Anxiety   ? Arnold-Chiari malformation ([place])   ? Arthritis   ? Borderline diabetic   ? Chronic headaches   ? Colon polyps   ? nonadenomatous  ? Depression   ? Diabetes mellitus without complication ([place])   ? Diverticulosis   ? Esophageal stricture   ? FROZEN LEFT SHOULDER 04/01/2010  ? Qualifier: Diagnosis of  By: Aline Brochure MD, Dorothyann Peng    ?  GERD (gastroesophageal reflux disease)   ? HTN (hypertension)   ? Hyperlipidemia   ? IBS (irritable bowel syndrome)   ? Seizures ([place])   ? pt not sure when her last seizure was- she says she just spaces out- no shaking but she could not tell me last seizure-  on Oxtellar bid for seizures   ? Sleep apnea   ? no cpap now- did use 02 with cpap but has not used either in "years"  ? ? ? ?Allergies  ?Allergen Reactions  ? Cymbalta [Duloxetine Hcl] Itching  ?  Nasal itching  ? Methocarbamol   ? Propoxyphene N-Acetaminophen Itching  ? Sulfa Antibiotics   ? ? ? ?Current Outpatient Medications  ?Medication Sig Dispense Refill  ? Accu-Chek Softclix Lancets lancets 1 each by Other route daily. as directed 100 each 3  ? AIMOVIG 70 MG/ML SOAJ Inject into the skin every 30 (thirty) days.    ? amLODipine (NORVASC) 10 MG tablet TAKE 1 TABLET EVERY DAY 90 tablet 1  ? atorvastatin (LIPITOR) 20 MG tablet Take 1 tablet (20 mg total) by mouth daily. 90 tablet 0  ? azelastine (ASTELIN) 0.1 % nasal spray Place 1 spray into both nostrils daily  at 12 noon. 30 mL 2  ? cetirizine (ZYRTEC) 10 MG tablet TAKE 1 TABLET BY MOUTH EVERY DAY 90 tablet 0  ? fluticasone (FLONASE) 50 MCG/ACT nasal spray Place 1 spray into both nostrils daily. 16 g 2  ? gabapentin (NEURONTIN) 300 MG capsule Take 600 mg by mouth at bedtime.    ? glucose blood (ACCU-CHEK AVIVA PLUS) test strip 1 each by Other route daily. Use as instructed 100 strip 3  ? naproxen sodium (ALEVE) 220 MG tablet Take 440 mg by mouth daily as needed (for pain).    ? Oxcarbazepine (TRILEPTAL) 300 MG tablet Take 300 mg by mouth 2 (two) times daily.    ? polyethylene glycol powder (GLYCOLAX/MIRALAX) 17 GM/SCOOP powder Take 17 g by mouth 2 (two) times daily as needed. 3350 g 1  ? propranolol (INDERAL) 60 MG tablet TAKE 1 TABLET TWICE DAILY 180 tablet 0  ? Semaglutide, 1 MG/DOSE, (OZEMPIC, 1 MG/DOSE,) 4 MG/3ML SOPN ADMINISTER 1 MG UNDER THE SKIN 1 TIME A WEEK 3 mL 3  ? UNABLE TO FIND Aleve pain  relieving lotion-daily    ? VITAMIN D PO Take 10,000 Units by mouth daily.    ? ?No current facility-administered medications for this visit.  ? ? ? ?Past Surgical History:  ?Procedure Laterality Date  ? BREAST BIOPSY Left   ? cahri decompression  8 and 03/1999  ? CHOLECYSTECTOMY    ? COLONOSCOPY    ? invasive cervical traction  02/2009  ? left breast-lumpectomy    ? POLYPECTOMY    ? shunt put in on right side of brain    ? SHUNT REPLACEMENT  12/10  ? TOTAL ABDOMINAL HYSTERECTOMY  Age 12 yrs  ? TAH & BSO  ? TUBAL LIGATION    ? UPPER GASTROINTESTINAL ENDOSCOPY    ? ? ? ?Allergies  ?Allergen Reactions  ? Cymbalta [Duloxetine Hcl] Itching  ?  Nasal itching  ? Methocarbamol   ? Propoxyphene N-Acetaminophen Itching  ? Sulfa Antibiotics   ? ? ? ? ?Family History  ?Problem Relation Age of Onset  ? Colon polyps Mother   ? Breast cancer Mother   ? Lung cancer Mother   ? Heart disease Father   ? Stroke Father   ? Aortic aneurysm Father   ? Chiari malformation Daughter   ? Addison's disease Son   ? Breast cancer Maternal Aunt   ? Colon cancer Neg Hx   ? Esophageal cancer Neg Hx   ? Rectal cancer Neg Hx   ? Stomach cancer Neg Hx   ? ? ? ?Social History ?[name] reports that she quit smoking about 31 years ago. Her smoking use included cigarettes. She has a 45.00 pack-year smoking history. She has never used smokeless tobacco. ?[name] reports no history of alcohol use. ? ? ?Review of Systems ?CONSTITUTIONAL: No weight loss, fever, chills, weakness or fatigue.  ?HEENT: Eyes: No visual loss, blurred vision, double vision or yellow sclerae.No hearing loss, sneezing, congestion, runny nose or sore throat.  ?SKIN: No rash or itching.  ?CARDIOVASCULAR: per hpi ?RESPIRATORY: per hpi ?GASTROINTESTINAL: No anorexia, nausea, vomiting or diarrhea. No abdominal pain or blood.  ?GENITOURINARY: No burning on urination, no polyuria ?NEUROLOGICAL: No headache, dizziness, syncope, paralysis, ataxia, numbness or tingling in the  extremities. No change in bowel or bladder control.  ?MUSCULOSKELETAL: No muscle, back pain, joint pain or stiffness.  ?LYMPHATICS: No enlarged nodes. No history of splenectomy.  ?PSYCHIATRIC: No history of depression or anxiety.  ?ENDOCRINOLOGIC:  No reports of sweating, cold or heat intolerance. No polyuria or polydipsia.  ?. ? ? ?Physical Examination ?Today's Vitals  ? 12/07/21 1538  ?BP: 110/70  ?Pulse: 64  ?SpO2: 98%  ?Weight: 197 lb 9.6 oz (89.6 kg)  ?Height: '5\' 2"'$  (1.575 m)  ? ?Body mass index is 36.14 kg/m?. ? ?Gen: resting comfortably, no acute distress ?HEENT: no scleral icterus, pupils equal round and reactive, no palptable cervical adenopathy,  ?CV: RRR, no m/r/g no jvd ?Resp: Clear to auscultation bilaterally ?GI: abdomen is soft, non-tender, non-distended, normal bowel sounds, no hepatosplenomegaly ?MSK: extremities are warm, 1+ bilateral LE edema ?Skin: warm, no rash ?Neuro:  no focal deficits ?Psych: appropriate affect ? ? ?Diagnostic Studies ?08/2016 Nuclear stress test ?No diagnostic ST segment changes to indicate ischemia. ?No significant myocardial perfusion defects to indicate scar or ischemia. ?This is a low risk study. ?Nuclear stress EF: 80%. ?  ?10/2020 monitor ?7 day monitor ?No reported patient symptoms or triggered events ?Rare supraventricular and ventricular ectopy. Isolated 4 beat run of SVT ?  ?  ?Patch Wear Time:  7 days and 2 hours (2022-02-02T15:09:51-0500 to 2022-02-09T17:54:08-499) ?  ?Patient had a min HR of 48 bpm, max HR of 114 bpm, and avg HR of 69 bpm. Predominant underlying rhythm was Sinus Rhythm. 1 run of Supraventricular Tachycardia occurred lasting 4 beats with a max rate of 114 bpm (avg 111 bpm). Isolated SVEs were rare  ?(<1.0%), SVE Couplets were rare (<1.0%), and SVE Triplets were rare (<1.0%). Isolated VEs were rare (<1.0%), VE Couplets were rare (<1.0%), and no VE Triplets were present.  ?  ? ? ? ?Assessment and Plan  ?1. Palpitations ?-ongoign symptoms primarily  at night, prior monitor with benign ectopy, isoalted short run SVT ?- increase evening propranolol to '80mg'$ , AM dose will stay at 70m?  ?2. HTN ?- at goal, continue current meds ? ?3. LE edema/DOE ?Obtain echo to evalu

## 2021-12-11 ENCOUNTER — Other Ambulatory Visit: Payer: Self-pay | Admitting: Internal Medicine

## 2021-12-11 DIAGNOSIS — E1169 Type 2 diabetes mellitus with other specified complication: Secondary | ICD-10-CM

## 2021-12-15 DIAGNOSIS — Z79899 Other long term (current) drug therapy: Secondary | ICD-10-CM | POA: Diagnosis not present

## 2021-12-16 ENCOUNTER — Encounter: Payer: Self-pay | Admitting: Internal Medicine

## 2021-12-18 ENCOUNTER — Other Ambulatory Visit: Payer: Self-pay | Admitting: Internal Medicine

## 2021-12-18 DIAGNOSIS — J310 Chronic rhinitis: Secondary | ICD-10-CM

## 2021-12-25 ENCOUNTER — Ambulatory Visit (AMBULATORY_SURGERY_CENTER): Payer: Medicare PPO | Admitting: Internal Medicine

## 2021-12-25 ENCOUNTER — Encounter: Payer: Self-pay | Admitting: Internal Medicine

## 2021-12-25 VITALS — BP 140/72 | HR 62 | Temp 96.6°F | Resp 12 | Ht 62.0 in | Wt 196.0 lb

## 2021-12-25 DIAGNOSIS — Z86010 Personal history of colonic polyps: Secondary | ICD-10-CM

## 2021-12-25 DIAGNOSIS — D122 Benign neoplasm of ascending colon: Secondary | ICD-10-CM

## 2021-12-25 DIAGNOSIS — E119 Type 2 diabetes mellitus without complications: Secondary | ICD-10-CM | POA: Diagnosis not present

## 2021-12-25 DIAGNOSIS — F419 Anxiety disorder, unspecified: Secondary | ICD-10-CM | POA: Diagnosis not present

## 2021-12-25 DIAGNOSIS — K635 Polyp of colon: Secondary | ICD-10-CM | POA: Diagnosis not present

## 2021-12-25 DIAGNOSIS — E785 Hyperlipidemia, unspecified: Secondary | ICD-10-CM | POA: Diagnosis not present

## 2021-12-25 MED ORDER — FLEET ENEMA 7-19 GM/118ML RE ENEM
2.0000 | ENEMA | Freq: Once | RECTAL | Status: AC
  Administered 2021-12-25: 1 via RECTAL

## 2021-12-25 MED ORDER — SODIUM CHLORIDE 0.9 % IV SOLN: 500.0000 mL | INTRAVENOUS | Status: DC

## 2021-12-25 NOTE — Progress Notes (Signed)
Called to room to assist during endoscopic procedure.  Patient ID and intended procedure confirmed with present staff. Received instructions for my participation in the procedure from the performing physician.  

## 2021-12-25 NOTE — Progress Notes (Signed)
Sedate, gd SR, tolerated procedure well, VSS, report to RN 

## 2021-12-25 NOTE — Patient Instructions (Signed)
Handouts Provided:  Polyps and Diverticulosis ° °YOU HAD AN ENDOSCOPIC PROCEDURE TODAY AT THE [facility]:   Refer to the procedure report that was given to you for any specific questions about what was found during the examination.  If the procedure report does not answer your questions, please call your gastroenterologist to clarify.  If you requested that your care partner not be given the details of your procedure findings, then the procedure report has been included in a sealed envelope for you to review at your convenience later. ° °YOU SHOULD EXPECT: Some feelings of bloating in the abdomen. Passage of more gas than usual.  Walking can help get rid of the air that was put into your GI tract during the procedure and reduce the bloating. If you had a lower endoscopy (such as a colonoscopy or flexible sigmoidoscopy) you may notice spotting of blood in your stool or on the toilet paper. If you underwent a bowel prep for your procedure, you may not have a normal bowel movement for a few days. ° °Please Note:  You might notice some irritation and congestion in your nose or some drainage.  This is from the oxygen used during your procedure.  There is no need for concern and it should clear up in a day or so. ° °SYMPTOMS TO REPORT IMMEDIATELY: ° °Following lower endoscopy (colonoscopy or flexible sigmoidoscopy): ° Excessive amounts of blood in the stool ° Significant tenderness or worsening of abdominal pains ° Swelling of the abdomen that is new, acute ° Fever of 100°F or higher ° °For urgent or emergent issues, a gastroenterologist can be reached at any hour by calling [phone]. °Do not use MyChart messaging for urgent concerns.  ° ° °DIET:  We do recommend a small meal at first, but then you may proceed to your regular diet.  Drink plenty of fluids but you should avoid alcoholic beverages for 24 hours. ° °ACTIVITY:  You should plan to take it easy for the rest of today and you should NOT DRIVE  or use heavy machinery until tomorrow (because of the sedation medicines used during the test).   ° °FOLLOW UP: °Our staff will call the number listed on your records 48-72 hours following your procedure to check on you and address any questions or concerns that you may have regarding the information given to you following your procedure. If we do not reach you, we will leave a message.  We will attempt to reach you two times.  During this call, we will ask if you have developed any symptoms of COVID 19. If you develop any symptoms (ie: fever, flu-like symptoms, shortness of breath, cough etc.) before then, please call [phone].  If you test positive for Covid 19 in the 2 weeks post procedure, please call and report this information to us.   ° °If any biopsies were taken you will be contacted by phone or by letter within the next 1-3 weeks.  Please call us at [phone] if you have not heard about the biopsies in 3 weeks.  ° ° °SIGNATURES/CONFIDENTIALITY: °You and/or your care partner have signed paperwork which will be entered into your electronic medical record.  These signatures attest to the fact that that the information above on your After Visit Summary has been reviewed and is understood.  Full responsibility of the confidentiality of this discharge information lies with you and/or your care-partner. ° °

## 2021-12-25 NOTE — Progress Notes (Signed)
Pt's states no medical or surgical changes since previsit or office visit.  ?Patient reports vomiting shortly after taking AM dose of the Sutabs, and her last BM brown liquid. Per Dr. Henrene Pastor FLEET enema administered, results is cloudy yellow water.   ?

## 2021-12-25 NOTE — Op Note (Signed)
[facility] ?Patient Name: Amanda Webster ?Procedure Date: 12/25/2021 9:32 AM ?MRN: [id number] ?Endoscopist: Docia Chuck. Henrene Pastor , MD ?Age: 61 ?Referring MD:  ?Date of Birth: 1961/06/02 ?Gender: Female ?Account #: [id number] ?Procedure:                Colonoscopy with cold snare polypectomy x 1 ?Indications:              High risk colon cancer surveillance: Personal  ?                          history of multiple (3 or more) adenomas, High risk  ?                          colon cancer surveillance: Personal history of  ?                          sessile serrated colon polyp (less than 10 mm in  ?                          size) with no dysplasia. Previous examinations  ?                          2003, 2014, 2019. Also reports change in bowel  ?                          habits with constipation ?Medicines:                Monitored Anesthesia Care ?Procedure:                Pre-Anesthesia Assessment: ?                          - Prior to the procedure, a History and Physical  ?                          was performed, and patient medications and  ?                          allergies were reviewed. The patient's tolerance of  ?                          previous anesthesia was also reviewed. The risks  ?                          and benefits of the procedure and the sedation  ?                          options and risks were discussed with the patient.  ?                          All questions were answered, and informed consent  ?                          was obtained. Prior Anticoagulants: The patient has  ?  taken no previous anticoagulant or antiplatelet  ?                          agents. ASA Grade Assessment: II - A patient with  ?                          mild systemic disease. After reviewing the risks  ?                          and benefits, the patient was deemed in  ?                          satisfactory condition to undergo the procedure. ?                          After obtaining  informed consent, the colonoscope  ?                          was passed under direct vision. Throughout the  ?                          procedure, the patient's blood pressure, pulse, and  ?                          oxygen saturations were monitored continuously. The  ?                          Olympus CF-[id number] (#[id number]) Colonoscope was  ?                          introduced through the anus and advanced to the the  ?                          cecum, identified by appendiceal orifice and  ?                          ileocecal valve. The ileocecal valve, appendiceal  ?                          orifice, and rectum were photographed. The quality  ?                          of the bowel preparation was good. The colonoscopy  ?                          was performed without difficulty. The patient  ?                          tolerated the procedure well. The bowel preparation  ?                          used was SUPREP via split dose instruction. ?Scope In: 9:50:20 AM ?Scope Out: 10:02:11 AM ?Scope Withdrawal Time: 0 hours 9 minutes 27 seconds  ?Total  Procedure Duration: 0 hours 11 minutes 51 seconds  ?Findings:                 A 4 mm polyp was found in the ascending colon. The  ?                          polyp was removed with a cold snare. Resection and  ?                          retrieval were complete. ?                          Multiple diverticula were found in the transverse  ?                          colon and left colon. There was a 15 mm lipoma in  ?                          the right colon as noted previously. ?                          The exam was otherwise without abnormality on  ?                          direct and retroflexion views. ?Complications:            No immediate complications. Estimated blood loss:  ?                          None. ?Estimated Blood Loss:     Estimated blood loss: none. ?Impression:               - One 4 mm polyp in the ascending colon, removed  ?                           with a cold snare. Resected and retrieved. ?                          - Diverticulosis in the transverse colon and in the  ?                          left colon. Right-sided lipoma. ?                          - The examination was otherwise normal on direct  ?                          and retroflexion views. ?Recommendation:           - Repeat colonoscopy in 5 years for surveillance. ?                          - Patient has a contact number available for  ?                          emergencies. The  signs and symptoms of potential  ?                          delayed complications were discussed with the  ?                          patient. Return to normal activities tomorrow.  ?                          Written discharge instructions were provided to the  ?                          patient. ?                          - Resume previous diet. ?                          - Continue present medications. ?                          - Await pathology results. ?                          -Take MiraLAX as needed for constipation ?Docia Chuck. Henrene Pastor, MD ?12/25/2021 10:09:17 AM ?This report has been signed electronically. ?

## 2021-12-25 NOTE — Progress Notes (Signed)
HISTORY OF PRESENT ILLNESS: ?  ?Amanda Webster is a 61 y.o. female with multiple medical problems as listed below.  She presents today with a chief complaint of constipation, back pain, and the need for surveillance colonoscopy.  She is known to have irritable bowel syndrome, GERD, and prior cholecystectomy with negative ERCP.  She was last seen in this office April 2018 regarding aerophagia with associated belching and GERD.  She has undergone prior colonoscopy in 2003, 2014, and November 2019.  She has a history of sessile serrated colon polyp.  Her most recent examination revealed 4 tubular adenomas for which follow-up in 3 years recommended.  She was also noted to have left-sided diverticulosis and incidental right-sided lipoma.  Review of blood work from October 2022 shows unremarkable comprehensive metabolic panel (alkaline phosphatase 158) and CBC with hemoglobin 14.0. ? ?Patient tells me that about 3 months ago she noticed a change in her bowel habits.  Previously she was able to have a bowel movement daily.  Since that time she has had a bowel movement every second or third day.  If she goes 3 days without moving her bowels she notices that she has low back pain.  This does improve somewhat after she is able to defecate.  She is known to have chronic back pain.  She denies bleeding.  She does have occasional abdominal cramping prior to bowel movements.  Her PCP recommended MiraLAX for constipation.  This has not been started.  She does take OTC agents for her reflux.  No dysphagia. ?  ?REVIEW OF SYSTEMS: ?  ?All non-GI ROS negative unless otherwise stated in the HPI except for sinus and allergy, arthritis, back pain, visual change, fatigue, urinary leakage, excessive urination, swelling of the feet, sore throat ?  ?    ?Past Medical History:  ?Diagnosis Date  ? Allergy    ? Anxiety    ? Arnold-Chiari malformation ([place])    ? Arthritis    ? Borderline diabetic    ? Chronic headaches    ? Colon polyps    ?   nonadenomatous  ? Depression    ? Diabetes mellitus without complication ([place])    ? Diverticulosis    ? Esophageal stricture    ? FROZEN LEFT SHOULDER 04/01/2010  ?  Qualifier: Diagnosis of  By: Aline Brochure MD, Dorothyann Peng    ? GERD (gastroesophageal reflux disease)    ? HTN (hypertension)    ? Hyperlipidemia    ? IBS (irritable bowel syndrome)    ? Seizures ([place])    ?  pt not sure when her last seizure was- she says she just spaces out- no shaking but she could not tell me last seizure-  on Oxtellar bid for seizures   ? Sleep apnea    ?  no cpap now- did use 02 with cpap but has not used either in "years"  ?  ?  ?     ?Past Surgical History:  ?Procedure Laterality Date  ? BREAST BIOPSY Left    ? cahri decompression   6 and 03/1999  ? CHOLECYSTECTOMY      ? COLONOSCOPY      ? invasive cervical traction   02/2009  ? left breast-lumpectomy      ? POLYPECTOMY      ? shunt put in on right side of brain      ? SHUNT REPLACEMENT   12/10  ? TOTAL ABDOMINAL HYSTERECTOMY   Age 72 yrs  ?  TAH &  BSO  ? TUBAL LIGATION      ? UPPER GASTROINTESTINAL ENDOSCOPY      ?  ?  ?Social History ?[place]  reports that she quit smoking about 31 years ago. Her smoking use included cigarettes. She has a 45.00 pack-year smoking history. She has never used smokeless tobacco. She reports that she does not drink alcohol and does not use drugs. ?  ?family history includes Addison's disease in her son; Aortic aneurysm in her father; Breast cancer in her maternal aunt and mother; Chiari malformation in her daughter; Colon polyps in her mother; Heart disease in her father; Lung cancer in her mother; Stroke in her father. ?  ?     ?Allergies  ?Allergen Reactions  ? Cymbalta [Duloxetine Hcl] Itching  ?    Nasal itching  ? Methocarbamol    ? Propoxyphene N-Acetaminophen Itching  ? Sulfa Antibiotics    ?  ?  ?  ?  ?PHYSICAL EXAMINATION: ?Vital signs: BP (!) 148/84   Pulse 62   Ht '5\' 2"'$  (1.575 m)   Wt 196 lb 4 oz (89 kg)   BMI 35.89 kg/m?    ?Constitutional: generally well-appearing, no acute distress ?Psychiatric: alert and oriented x3, cooperative ?Eyes: extraocular movements intact, anicteric, conjunctiva pink ?Mouth: oral pharynx moist, no lesions ?Neck: supple no lymphadenopathy ?Cardiovascular: heart regular rate and rhythm, no murmur ?Lungs: clear to auscultation bilaterally ?Abdomen: soft, obese, nontender, nondistended, no obvious ascites, no peritoneal signs, normal bowel sounds, no organomegaly ?Rectal: Deferred to colonoscopy ?Extremities: no clubbing or cyanosis.  1+ lower extremity edema bilaterally ?Skin: no lesions on visible extremities ?Neuro: No focal deficits. No asterixis.  ?  ?  ?  ?ASSESSMENT: ?  ?1.  New onset worsening constipation.  Slow transit likely. ?2.  History of sessile serrated polyp and multiple adenomatous colon polyps.  Due for surveillance ?3.  Multiple medical problems including obesity and diabetes mellitus ?  ?  ?PLAN: ?  ?1.  MiraLAX.  1 dose daily.  Titrate to need.  Reviewed. ?2.  Schedule surveillance colonoscopy to evaluate change in bowel habits and provide colorectal neoplasia surveillance, which is due.  Patient is higher than baseline risk due to her body habitus and comorbidities.The nature of the procedure, as well as the risks, benefits, and alternatives were carefully and thoroughly reviewed with the patient. Ample time for discussion and questions allowed. The patient understood, was satisfied, and agreed to proceed.  ?3.  Reflux precautions with attention to weight loss ?4.  On-demand acid reducing agents to address symptomatic periods of reflux. ?  ?  ?

## 2021-12-29 ENCOUNTER — Ambulatory Visit (INDEPENDENT_AMBULATORY_CARE_PROVIDER_SITE_OTHER): Payer: Medicare PPO | Admitting: *Deleted

## 2021-12-29 ENCOUNTER — Telehealth: Payer: Self-pay

## 2021-12-29 ENCOUNTER — Encounter: Payer: Self-pay | Admitting: Internal Medicine

## 2021-12-29 ENCOUNTER — Ambulatory Visit: Payer: Medicare PPO | Admitting: Internal Medicine

## 2021-12-29 ENCOUNTER — Telehealth: Payer: Self-pay | Admitting: Internal Medicine

## 2021-12-29 DIAGNOSIS — R300 Dysuria: Secondary | ICD-10-CM | POA: Diagnosis not present

## 2021-12-29 DIAGNOSIS — N10 Acute tubulo-interstitial nephritis: Secondary | ICD-10-CM

## 2021-12-29 LAB — POCT URINALYSIS DIP (CLINITEK)
Bilirubin, UA: NEGATIVE
Blood, UA: NEGATIVE
Glucose, UA: NEGATIVE mg/dL
Nitrite, UA: POSITIVE — AB
POC PROTEIN,UA: NEGATIVE
Spec Grav, UA: 1.025 (ref 1.010–1.025)
Urobilinogen, UA: 0.2 E.U./dL
pH, UA: 5.5 (ref 5.0–8.0)

## 2021-12-29 MED ORDER — CIPROFLOXACIN HCL 500 MG PO TABS: 500.0000 mg | ORAL_TABLET | Freq: Two times a day (BID) | ORAL | 0 refills | Status: DC

## 2021-12-29 NOTE — Telephone Encounter (Signed)
Pt can make a virtual appointment to discuss with provider however she may need to drop off urine if having odor please schedule ?

## 2021-12-29 NOTE — Telephone Encounter (Signed)
Patient aware to come leave sample  and appt shceduled 4/20 11:40 ?

## 2021-12-29 NOTE — Telephone Encounter (Signed)
Attempted f/u call. No answer, left VM. 

## 2021-12-29 NOTE — Progress Notes (Signed)
?  ? ?Virtual Visit via Telephone Note  ? ?This visit type was conducted due to national recommendations for restrictions regarding the COVID-19 Pandemic (e.g. social distancing) in an effort to limit this patient's exposure and mitigate transmission in our community.  Due to her co-morbid illnesses, this patient is at least at moderate risk for complications without adequate follow up.  This format is felt to be most appropriate for this patient at this time.  The patient did not have access to video technology/had technical difficulties with video requiring transitioning to audio format only (telephone).  All issues noted in this document were discussed and addressed.  No physical exam could be performed with this format. ? ?Evaluation Performed:  Follow-up visit ? ?Date:  12/29/2021  ? ?IDBAILEIGH Webster, DOB 08/23/1961, MRN [id number] ? ?Patient Location: Home ?Provider Location: Office/Clinic ? ?Participants: Patient ?Location of Patient: Home ?Location of Provider: Telehealth ?Consent was obtain for visit to be over via telehealth. ?I verified that I am speaking with the correct person using two identifiers. ? ?PCP:  [name], MD  ? ?Chief Complaint:  Dysuria, malodorous urine and flank pain ? ?History of Present Illness:   ? ?Amanda Webster is a 61 y.o. female who has a televisit for complaint of dysuria, malodorous urine and lower abdominal pain for the last 5 to 6 days.  She also reports mild bilateral flank pain.  She currently denies any fever or chills.  Denies any vaginal discharge or bleeding currently.  Denies any nausea, vomiting or diarrhea. ? ?The patient does not have symptoms concerning for COVID-19 infection (fever, chills, cough, or new shortness of breath).  ? ?Past Medical, Surgical, Social History, Allergies, and Medications have been Reviewed. ? ?Past Medical History:  ?Diagnosis Date  ? Allergy   ? Anxiety   ? Arnold-Chiari malformation ([place])   ? Arthritis   ? Borderline  diabetic   ? Chronic headaches   ? Colon polyps   ? nonadenomatous  ? Depression   ? Diabetes mellitus without complication ([place])   ? Diverticulosis   ? Esophageal stricture   ? FROZEN LEFT SHOULDER 04/01/2010  ? Qualifier: Diagnosis of  By: Aline Brochure MD, Dorothyann Peng    ? GERD (gastroesophageal reflux disease)   ? HTN (hypertension)   ? Hyperlipidemia   ? IBS (irritable bowel syndrome)   ? Seizures ([place])   ? pt not sure when her last seizure was- she says she just spaces out- no shaking but she could not tell me last seizure-  on Oxtellar bid for seizures   ? Sleep apnea   ? no cpap now- did use 02 with cpap but has not used either in "years"  ? ?Past Surgical History:  ?Procedure Laterality Date  ? BREAST BIOPSY Left   ? cahri decompression  6 and 03/1999  ? CHOLECYSTECTOMY    ? COLONOSCOPY    ? invasive cervical traction  02/2009  ? left breast-lumpectomy    ? POLYPECTOMY    ? shunt put in on right side of brain    ? SHUNT REPLACEMENT  12/10  ? TOTAL ABDOMINAL HYSTERECTOMY  Age 85 yrs  ? TAH & BSO  ? TUBAL LIGATION    ? UPPER GASTROINTESTINAL ENDOSCOPY    ?  ? ?Current Meds  ?Medication Sig  ? Accu-Chek Softclix Lancets lancets 1 each by Other route daily. as directed  ? AIMOVIG 70 MG/ML SOAJ Inject into the skin every 30 (thirty) days.  ?  amLODipine (NORVASC) 10 MG tablet TAKE 1 TABLET EVERY DAY  ? atorvastatin (LIPITOR) 20 MG tablet Take 1 tablet (20 mg total) by mouth daily.  ? azelastine (ASTELIN) 0.1 % nasal spray Place 1 spray into both nostrils daily at 12 noon.  ? cetirizine (ZYRTEC) 10 MG tablet TAKE 1 TABLET EVERY DAY  ? fluticasone (FLONASE) 50 MCG/ACT nasal spray Place 1 spray into both nostrils daily.  ? gabapentin (NEURONTIN) 300 MG capsule Take 600 mg by mouth at bedtime.  ? glucose blood (ACCU-CHEK AVIVA PLUS) test strip 1 each by Other route daily. Use as instructed  ? naproxen sodium (ALEVE) 220 MG tablet Take 440 mg by mouth daily as needed (for pain).  ? Oxcarbazepine (TRILEPTAL) 300 MG tablet Take 300  mg by mouth 2 (two) times daily.  ? polyethylene glycol powder (GLYCOLAX/MIRALAX) 17 GM/SCOOP powder Take 17 g by mouth 2 (two) times daily as needed.  ? propranolol (INDERAL) 60 MG tablet Take 1 tablet (60 mg total) by mouth every morning.  ? propranolol (INDERAL) 80 MG tablet Take 1 tablet (80 mg total) by mouth every evening.  ? Semaglutide, 1 MG/DOSE, (OZEMPIC, 1 MG/DOSE,) 4 MG/3ML SOPN INJECT 1 MG UNDER THE SKIN 1 TIME A WEEK  ? UNABLE TO FIND Aleve pain relieving lotion-daily  ? VITAMIN D PO Take 10,000 Units by mouth daily.  ?  ? ?Allergies:   Cymbalta [duloxetine hcl], Methocarbamol, Propoxyphene n-acetaminophen, and Sulfa antibiotics  ? ?ROS:   ?Please see the history of present illness.    ? ?All other systems reviewed and are negative. ? ? ?Labs/Other Tests and Data Reviewed:   ? ?Recent Labs: ?07/07/2021: ALT 23; BUN 7; Creatinine, Ser 0.71; Hemoglobin 14.0; Platelets 178; Potassium 4.7; Sodium 137; TSH 2.270  ? ?Recent Lipid Panel ?Lab Results  ?Component Value Date/Time  ? CHOL 169 07/07/2021 08:00 AM  ? TRIG 160 (H) 07/07/2021 08:00 AM  ? HDL 51 07/07/2021 08:00 AM  ? CHOLHDL 3.3 07/07/2021 08:00 AM  ? CHOLHDL 4.0 03/04/2020 01:37 PM  ? LDLCALC 90 07/07/2021 08:00 AM  ? LDLCALC 96 03/04/2020 01:37 PM  ? ? ?Wt Readings from Last 3 Encounters:  ?12/25/21 196 lb (88.9 kg)  ?12/07/21 197 lb 9.6 oz (89.6 kg)  ?11/04/21 196 lb 4 oz (89 kg)  ?  ? ?ASSESSMENT & PLAN:   ? ?UTI/acute pyelonephritis ?UA reviewed ?Check urine culture ?Considering she has flank pain, she could have pyelonephritis ?Started ciprofloxacin -unable to prescribe Bactrim due to sulfa allergy ?Advised to maintain adequate hydration ? ?Time:   ?Today, I have spent 13 minutes reviewing the chart, including problem list, medications, and with the patient with telehealth technology discussing the above problems. ? ? ?Medication Adjustments/Labs and Tests Ordered: ?Current medicines are reviewed at length with the patient today.  Concerns  regarding medicines are outlined above.  ? ?Tests Ordered: ?No orders of the defined types were placed in this encounter. ? ? ?Medication Changes: ?No orders of the defined types were placed in this encounter. ? ? ? ?Note: This dictation was prepared with Dragon dictation along with smaller phrase technology. Similar sounding words can be transcribed inadequately or may not be corrected upon review. Any transcriptional errors that result from this process are unintentional.  ?  ? ? ?Disposition:  Follow up  ?Signed, ?[name], MD  ?12/29/2021 4:17 PM    ? ?[facility] ?[facility] ?

## 2021-12-29 NOTE — Telephone Encounter (Signed)
Patient called in regard to recent colonoscopy. ? ?Patient stases that she had to take a pill before her colonoscopy instead of the drink and her urine has has a strong smell since. ? ?Patient wants a call back in regard. ?

## 2021-12-29 NOTE — Telephone Encounter (Signed)
?  Follow up Call- ? ? ?  12/25/2021  ?  8:47 AM  ?Call back number  ?Post procedure Call Back phone  # [phone]  ?Permission to leave phone message Yes  ?  ? ?Patient questions: ? ?Do you have a fever, pain , or abdominal swelling? No. ?Pain Score  0 * ? ?Have you tolerated food without any problems? Yes.   ? ?Have you been able to return to your normal activities? Yes.   ? ?Do you have any questions about your discharge instructions: ?Diet   No. ?Medications  No. ?Follow up visit  No. ? ?Do you have questions or concerns about your Care? No. ? ?Actions: ?* If pain score is 4 or above: ?No action needed, pain <4. ? ? ?

## 2021-12-30 ENCOUNTER — Encounter: Payer: Self-pay | Admitting: Internal Medicine

## 2021-12-31 ENCOUNTER — Telehealth: Payer: Medicare PPO | Admitting: Internal Medicine

## 2022-01-01 LAB — URINE CULTURE

## 2022-01-12 ENCOUNTER — Ambulatory Visit (INDEPENDENT_AMBULATORY_CARE_PROVIDER_SITE_OTHER): Payer: Medicare PPO

## 2022-01-12 DIAGNOSIS — R0602 Shortness of breath: Secondary | ICD-10-CM | POA: Diagnosis not present

## 2022-01-12 LAB — ECHOCARDIOGRAM COMPLETE
AR max vel: 3.01 cm2
AV Area VTI: 2.6 cm2
AV Area mean vel: 2.74 cm2
AV Mean grad: 2.5 mmHg
AV Peak grad: 4.5 mmHg
Ao pk vel: 1.07 m/s
Area-P 1/2: 2.92 cm2
Calc EF: 68.9 %
S' Lateral: 2.72 cm
Single Plane A2C EF: 70.7 %
Single Plane A4C EF: 65.5 %

## 2022-01-26 DIAGNOSIS — R2681 Unsteadiness on feet: Secondary | ICD-10-CM | POA: Diagnosis not present

## 2022-01-26 DIAGNOSIS — G43C1 Periodic headache syndromes in child or adult, intractable: Secondary | ICD-10-CM | POA: Diagnosis not present

## 2022-01-26 DIAGNOSIS — E1143 Type 2 diabetes mellitus with diabetic autonomic (poly)neuropathy: Secondary | ICD-10-CM | POA: Diagnosis not present

## 2022-01-26 DIAGNOSIS — I951 Orthostatic hypotension: Secondary | ICD-10-CM | POA: Diagnosis not present

## 2022-01-26 DIAGNOSIS — R42 Dizziness and giddiness: Secondary | ICD-10-CM | POA: Diagnosis not present

## 2022-01-26 DIAGNOSIS — G935 Compression of brain: Secondary | ICD-10-CM | POA: Diagnosis not present

## 2022-01-31 ENCOUNTER — Other Ambulatory Visit: Payer: Self-pay | Admitting: Internal Medicine

## 2022-01-31 DIAGNOSIS — E1169 Type 2 diabetes mellitus with other specified complication: Secondary | ICD-10-CM

## 2022-02-01 ENCOUNTER — Encounter: Payer: Self-pay | Admitting: Cardiology

## 2022-02-02 NOTE — Telephone Encounter (Signed)
How low are blood pressures getting or is it just that she gets dizzy with standing?  Zandra Abts MD

## 2022-02-03 ENCOUNTER — Encounter: Payer: Self-pay | Admitting: Internal Medicine

## 2022-02-03 ENCOUNTER — Ambulatory Visit (INDEPENDENT_AMBULATORY_CARE_PROVIDER_SITE_OTHER): Payer: Medicare PPO | Admitting: Internal Medicine

## 2022-02-03 VITALS — BP 118/82 | HR 63 | Resp 18 | Ht 62.0 in | Wt 190.6 lb

## 2022-02-03 DIAGNOSIS — E785 Hyperlipidemia, unspecified: Secondary | ICD-10-CM

## 2022-02-03 DIAGNOSIS — I10 Essential (primary) hypertension: Secondary | ICD-10-CM

## 2022-02-03 DIAGNOSIS — Z0001 Encounter for general adult medical examination with abnormal findings: Secondary | ICD-10-CM | POA: Insufficient documentation

## 2022-02-03 DIAGNOSIS — E559 Vitamin D deficiency, unspecified: Secondary | ICD-10-CM

## 2022-02-03 DIAGNOSIS — E1169 Type 2 diabetes mellitus with other specified complication: Secondary | ICD-10-CM | POA: Diagnosis not present

## 2022-02-03 MED ORDER — AMLODIPINE BESYLATE 5 MG PO TABS: 5.0000 mg | ORAL_TABLET | Freq: Every day | ORAL | 1 refills | Status: DC

## 2022-02-03 NOTE — Patient Instructions (Addendum)
Please start taking Amlodipine 5 mg instead of 10 mg.  Please continue to follow low salt diet and ambulate as tolerated.  Please consider getting Shingrix vaccine at your local pharmacy.  Please increase fluid intake to at least 64 ounces in a day.

## 2022-02-03 NOTE — Assessment & Plan Note (Signed)
Has type II DM, HLD and HTN Diet modification and moderate exercise/walking as tolerated On GLP-1 agonist therapy for type II DM

## 2022-02-03 NOTE — Progress Notes (Signed)
Established Patient Office Visit  Subjective:  Patient ID: Amanda Webster, female    DOB: 08/07/1961  Age: 61 y.o. MRN: [id number]  CC:  Chief Complaint  Patient presents with   Annual Exam    Annual exam pt has been having issues with bp dropping she saw [place] did ortho vitals and it dropped when she stood up [place] wanted her to half one of her bp medication amlodipine     HPI Amanda Webster is a 61 y.o. female with past medical history of HTN, type II DM with HLD, polyarthritis, migraine, seizure disorder, allergic rhinitis and morbid obesity who presents for annual physical.  HTN: BP is well-controlled. Takes medications regularly. Patient denies headache, chest pain, dyspnea or palpitations.  She has been having intermittent dizziness, and was found to have orthostatic hypotension in Dr. Freddie Apley office.  Of note, she admits that she has very poor p.o. intake of fluids.  Type II DM with HLD: Her HbA1C was 6.4 in the last visit.  She takes Ozempic 1 mg every week.  Her blood glucose has been around 150 and less most of the time. She denies any polyuria or polydipsia. She takes Lipitor as well.  She sees [facility] for h/o seizure disorder and migraine.   Past Medical History:  Diagnosis Date   Allergy    Anxiety    Arnold-Chiari malformation (HCC)    Arthritis    Borderline diabetic    Chronic headaches    Colon polyps    nonadenomatous   Depression    Diabetes mellitus without complication ([place])    Diverticulosis    Esophageal stricture    FROZEN LEFT SHOULDER 04/01/2010   Qualifier: Diagnosis of  By: Aline Brochure MD, Dorothyann Peng     GERD (gastroesophageal reflux disease)    HTN (hypertension)    Hyperlipidemia    IBS (irritable bowel syndrome)    Seizures ([place])    pt not sure when her last seizure was- she says she just spaces out- no shaking but she could not tell me last seizure-  on Oxtellar bid for seizures    Sleep apnea    no cpap now- did use 02  with cpap but has not used either in "years"    Past Surgical History:  Procedure Laterality Date   BREAST BIOPSY Left    cahri decompression  6 and 03/1999   CHOLECYSTECTOMY     COLONOSCOPY     invasive cervical traction  02/2009   left breast-lumpectomy     POLYPECTOMY     shunt put in on right side of brain     SHUNT REPLACEMENT  12/10   TOTAL ABDOMINAL HYSTERECTOMY  Age 65 yrs   TAH & BSO   TUBAL LIGATION     UPPER GASTROINTESTINAL ENDOSCOPY      Family History  Problem Relation Age of Onset   Colon polyps Mother    Breast cancer Mother    Lung cancer Mother    Heart disease Father    Stroke Father    Aortic aneurysm Father    Chiari malformation Daughter    Addison's disease Son    Breast cancer Maternal Aunt    Colon cancer Neg Hx    Esophageal cancer Neg Hx    Rectal cancer Neg Hx    Stomach cancer Neg Hx     Social History   Socioeconomic History   Marital status: Married    Spouse name: Not on file  Number of children: Not on file   Years of education: Not on file   Highest education level: Not on file  Occupational History   Not on file  Tobacco Use   Smoking status: Former    Packs/day: 1.50    Years: 30.00    Pack years: 45.00    Types: Cigarettes    Quit date: 09/13/1990    Years since quitting: 31.4   Smokeless tobacco: Never  Vaping Use   Vaping Use: Never used  Substance and Sexual Activity   Alcohol use: No   Drug use: No   Sexual activity: Not Currently    Birth control/protection: Surgical    Comment: hyst  Other Topics Concern   Not on file  Social History Narrative   Disabled from Chiari malformation/seizures.. Drinks 4-5 cups of tea daily. Does not get regular exercise. Married for 40 years.   Social Determinants of Health   Financial Resource Strain: Not on file  Food Insecurity: Not on file  Transportation Needs: Not on file  Physical Activity: Not on file  Stress: Not on file  Social Connections: Not on file  Intimate  Partner Violence: Not on file    Outpatient Medications Prior to Visit  Medication Sig Dispense Refill   Accu-Chek Softclix Lancets lancets 1 each by Other route daily. as directed 100 each 3   AIMOVIG 70 MG/ML SOAJ Inject into the skin every 30 (thirty) days.     atorvastatin (LIPITOR) 20 MG tablet TAKE 1 TABLET EVERY DAY 90 tablet 0   azelastine (ASTELIN) 0.1 % nasal spray Place 1 spray into both nostrils daily at 12 noon. 30 mL 2   cetirizine (ZYRTEC) 10 MG tablet TAKE 1 TABLET EVERY DAY 90 tablet 0   fluticasone (FLONASE) 50 MCG/ACT nasal spray Place 1 spray into both nostrils daily. 16 g 2   gabapentin (NEURONTIN) 300 MG capsule Take 600 mg by mouth at bedtime.     glucose blood (ACCU-CHEK AVIVA PLUS) test strip 1 each by Other route daily. Use as instructed 100 strip 3   naproxen sodium (ALEVE) 220 MG tablet Take 440 mg by mouth daily as needed (for pain).     Oxcarbazepine (TRILEPTAL) 300 MG tablet Take 300 mg by mouth 2 (two) times daily.     polyethylene glycol powder (GLYCOLAX/MIRALAX) 17 GM/SCOOP powder Take 17 g by mouth 2 (two) times daily as needed. 3350 g 1   propranolol (INDERAL) 60 MG tablet Take 1 tablet (60 mg total) by mouth every morning. 90 tablet 3   propranolol (INDERAL) 80 MG tablet Take 1 tablet (80 mg total) by mouth every evening. 30 tablet 3   Semaglutide, 1 MG/DOSE, (OZEMPIC, 1 MG/DOSE,) 4 MG/3ML SOPN INJECT 1 MG UNDER THE SKIN 1 TIME A WEEK 9 mL 1   UNABLE TO FIND Aleve pain relieving lotion-daily     VITAMIN D PO Take 10,000 Units by mouth daily.     amLODipine (NORVASC) 10 MG tablet TAKE 1 TABLET EVERY DAY 90 tablet 1   ciprofloxacin (CIPRO) 500 MG tablet Take 1 tablet (500 mg total) by mouth 2 (two) times daily. 14 tablet 0   No facility-administered medications prior to visit.    Allergies  Allergen Reactions   Cymbalta [Duloxetine Hcl] Itching    Nasal itching   Methocarbamol    Propoxyphene N-Acetaminophen Itching   Sulfa Antibiotics      ROS Review of Systems  Constitutional:  Negative for chills and fever.  HENT:  Negative for congestion, sinus pressure and sinus pain.   Eyes:  Negative for pain and discharge.  Respiratory:  Negative for cough and shortness of breath.   Cardiovascular:  Negative for chest pain and palpitations.  Gastrointestinal:  Negative for diarrhea, nausea and vomiting.  Genitourinary:  Negative for dysuria and hematuria.  Musculoskeletal:  Positive for arthralgias, back pain and gait problem. Negative for neck pain and neck stiffness.  Skin:  Negative for rash.  Neurological:  Positive for dizziness. Negative for weakness.  Psychiatric/Behavioral:  Negative for agitation.      Objective:    Physical Exam Vitals reviewed.  Constitutional:      General: She is not in acute distress.    Appearance: She is obese. She is not diaphoretic.  HENT:     Head: Normocephalic and atraumatic.     Nose: Nose normal.     Mouth/Throat:     Mouth: Mucous membranes are moist.  Eyes:     General: No scleral icterus.    Extraocular Movements: Extraocular movements intact.  Cardiovascular:     Rate and Rhythm: Normal rate and regular rhythm.     Pulses: Normal pulses.     Heart sounds: Normal heart sounds. No murmur heard. Pulmonary:     Breath sounds: Normal breath sounds. No wheezing or rales.  Abdominal:     Palpations: Abdomen is soft.     Tenderness: There is no abdominal tenderness.  Musculoskeletal:     Cervical back: Neck supple. No tenderness.     Right lower leg: No edema.     Left lower leg: No edema.  Skin:    General: Skin is warm.     Findings: No rash.  Neurological:     General: No focal deficit present.     Mental Status: She is alert and oriented to person, place, and time.     Cranial Nerves: No cranial nerve deficit.     Sensory: No sensory deficit.     Motor: No weakness.  Psychiatric:        Mood and Affect: Mood normal.        Behavior: Behavior normal.    BP  118/82 (BP Location: Right Arm, Patient Position: Sitting, Cuff Size: Normal)   Pulse 63   Resp 18   Ht 5' 2"  (1.575 m)   Wt 190 lb 9.6 oz (86.5 kg)   SpO2 97%   BMI 34.86 kg/m  Wt Readings from Last 3 Encounters:  02/03/22 190 lb 9.6 oz (86.5 kg)  12/25/21 196 lb (88.9 kg)  12/07/21 197 lb 9.6 oz (89.6 kg)    Lab Results  Component Value Date   TSH 2.270 07/07/2021   Lab Results  Component Value Date   WBC 5.6 07/07/2021   HGB 14.0 07/07/2021   HCT 41.2 07/07/2021   MCV 91 07/07/2021   PLT 178 07/07/2021   Lab Results  Component Value Date   NA 137 07/07/2021   K 4.7 07/07/2021   CO2 27 07/07/2021   GLUCOSE 144 (H) 07/07/2021   BUN 7 07/07/2021   CREATININE 0.71 07/07/2021   BILITOT 0.2 07/07/2021   ALKPHOS 158 (H) 07/07/2021   AST 18 07/07/2021   ALT 23 07/07/2021   PROT 6.6 07/07/2021   ALBUMIN 4.7 07/07/2021   CALCIUM 9.9 07/07/2021   ANIONGAP 8 03/25/2021   EGFR 98 07/07/2021   Lab Results  Component Value Date   CHOL 169 07/07/2021   Lab Results  Component Value Date  HDL 51 07/07/2021   Lab Results  Component Value Date   LDLCALC 90 07/07/2021   Lab Results  Component Value Date   TRIG 160 (H) 07/07/2021   Lab Results  Component Value Date   CHOLHDL 3.3 07/07/2021   Lab Results  Component Value Date   HGBA1C 6.4 10/06/2021   HGBA1C 6.4 10/06/2021      Assessment & Plan:   Problem List Items Addressed This Visit       Cardiovascular and Mediastinum   Essential hypertension    BP Readings from Last 1 Encounters:  02/03/22 118/82  Well-controlled with Amlodipine and Propranolol, but has intermittent low BP readings and orthostatic hypotension -needs to improve hydration Decreased dose of amlodipine to 5 mg daily Counseled for compliance with the medications Advised DASH diet and moderate exercise/walking, at least 150 mins/week      Relevant Medications   amLODipine (NORVASC) 5 MG tablet     Endocrine   Diabetes mellitus  ([place])    Lab Results  Component Value Date   HGBA1C 6.4 10/06/2021   HGBA1C 6.4 10/06/2021  Well-controlled now with Ozempic 1 mg qw Advised to follow diabetic diet On statin F/u CMP and lipid panel Diabetic eye exam: Advised to follow up with Ophthalmology for diabetic eye exam      Relevant Orders   Microalbumin / creatinine urine ratio   Hemoglobin A1c   CMP14+EGFR   Hyperlipidemia associated with type 2 diabetes mellitus (HCC)    On statin Check lipid profile       Relevant Medications   amLODipine (NORVASC) 5 MG tablet   Other Relevant Orders   Lipid panel     Other   Morbid obesity ([place])    Has type II DM, HLD and HTN Diet modification and moderate exercise/walking as tolerated On GLP-1 agonist therapy for type II DM       Encounter for general adult medical examination with abnormal findings - Primary    Physical exam as documented. Counseling done  re healthy lifestyle involving commitment to 150 minutes exercise per week, heart healthy diet, and attaining healthy weight.The importance of adequate sleep also discussed. Changes in health habits are decided on by the patient with goals and time frames  set for achieving them. Immunization and cancer screening needs are specifically addressed at this visit.      Relevant Orders   VITAMIN D 25 Hydroxy (Vit-D Deficiency, Fractures)   TSH   Lipid panel   CMP14+EGFR   CBC with Differential/Platelet   Other Visit Diagnoses     Vitamin D deficiency       Relevant Orders   VITAMIN D 25 Hydroxy (Vit-D Deficiency, Fractures)       Meds ordered this encounter  Medications   amLODipine (NORVASC) 5 MG tablet    Sig: Take 1 tablet (5 mg total) by mouth daily.    Dispense:  90 tablet    Refill:  1    Dose change - 05/24    Follow-up: Return in about 4 months (around 06/06/2022) for HTN, DM and OSA.    [name], MD

## 2022-02-03 NOTE — Assessment & Plan Note (Signed)
BP Readings from Last 1 Encounters:  02/03/22 118/82   Well-controlled with Amlodipine and Propranolol, but has intermittent low BP readings and orthostatic hypotension -needs to improve hydration Decreased dose of amlodipine to 5 mg daily Counseled for compliance with the medications Advised DASH diet and moderate exercise/walking, at least 150 mins/week

## 2022-02-03 NOTE — Assessment & Plan Note (Signed)
On statin Check lipid profile 

## 2022-02-03 NOTE — Assessment & Plan Note (Signed)
Lab Results  Component Value Date   HGBA1C 6.4 10/06/2021   HGBA1C 6.4 10/06/2021   Well-controlled now with Ozempic 1 mg qw Advised to follow diabetic diet On statin F/u CMP and lipid panel Diabetic eye exam: Advised to follow up with Ophthalmology for diabetic eye exam

## 2022-02-03 NOTE — Assessment & Plan Note (Signed)

## 2022-02-05 LAB — CBC WITH DIFFERENTIAL/PLATELET
Basophils Absolute: 0 10*3/uL (ref 0.0–0.2)
Basos: 1 %
EOS (ABSOLUTE): 0.1 10*3/uL (ref 0.0–0.4)
Eos: 1 %
Hematocrit: 40.8 % (ref 34.0–46.6)
Hemoglobin: 15.1 g/dL (ref 11.1–15.9)
Immature Grans (Abs): 0 10*3/uL (ref 0.0–0.1)
Immature Granulocytes: 0 %
Lymphocytes Absolute: 1.8 10*3/uL (ref 0.7–3.1)
Lymphs: 26 %
MCH: 33 pg (ref 26.6–33.0)
MCHC: 37 g/dL — ABNORMAL HIGH (ref 31.5–35.7)
MCV: 89 fL (ref 79–97)
Monocytes Absolute: 0.6 10*3/uL (ref 0.1–0.9)
Monocytes: 9 %
Neutrophils Absolute: 4.4 10*3/uL (ref 1.4–7.0)
Neutrophils: 63 %
Platelets: 209 10*3/uL (ref 150–450)
RBC: 4.58 x10E6/uL (ref 3.77–5.28)
RDW: 12.4 % (ref 11.7–15.4)
WBC: 7 10*3/uL (ref 3.4–10.8)

## 2022-02-05 LAB — CMP14+EGFR
ALT: 17 IU/L (ref 0–32)
AST: 17 IU/L (ref 0–40)
Albumin/Globulin Ratio: 2 (ref 1.2–2.2)
Albumin: 4.4 g/dL (ref 3.8–4.9)
Alkaline Phosphatase: 162 IU/L — ABNORMAL HIGH (ref 44–121)
BUN/Creatinine Ratio: 13 (ref 9–23)
BUN: 12 mg/dL (ref 6–24)
Bilirubin Total: 0.3 mg/dL (ref 0.0–1.2)
CO2: 29 mmol/L (ref 20–29)
Calcium: 9.6 mg/dL (ref 8.7–10.2)
Chloride: 100 mmol/L (ref 96–106)
Creatinine, Ser: 0.94 mg/dL (ref 0.57–1.00)
Globulin, Total: 2.2 g/dL (ref 1.5–4.5)
Glucose: 145 mg/dL — ABNORMAL HIGH (ref 70–99)
Potassium: 3.9 mmol/L (ref 3.5–5.2)
Sodium: 142 mmol/L (ref 134–144)
Total Protein: 6.6 g/dL (ref 6.0–8.5)
eGFR: 70 mL/min/{1.73_m2} (ref >59)

## 2022-02-05 LAB — MICROALBUMIN / CREATININE URINE RATIO
Creatinine, Urine: 258.4 mg/dL
Microalb/Creat Ratio: 4 mg/g creat (ref 0–29)
Microalbumin, Urine: 11.2 ug/mL

## 2022-02-05 LAB — LIPID PANEL
Chol/HDL Ratio: 3.2 ratio (ref 0.0–4.4)
Cholesterol, Total: 152 mg/dL (ref 100–199)
HDL: 47 mg/dL (ref >39)
LDL Chol Calc (NIH): 68 mg/dL (ref 0–99)
Triglycerides: 228 mg/dL — ABNORMAL HIGH (ref 0–149)
VLDL Cholesterol Cal: 37 mg/dL (ref 5–40)

## 2022-02-05 LAB — HEMOGLOBIN A1C
Est. average glucose Bld gHb Est-mCnc: 148 mg/dL
Hgb A1c MFr Bld: 6.8 % — ABNORMAL HIGH (ref 4.8–5.6)

## 2022-02-05 LAB — VITAMIN D 25 HYDROXY (VIT D DEFICIENCY, FRACTURES): Vit D, 25-Hydroxy: 52.5 ng/mL (ref 30.0–100.0)

## 2022-02-05 LAB — TSH: TSH: 0.684 u[IU]/mL (ref 0.450–4.500)

## 2022-02-10 ENCOUNTER — Ambulatory Visit: Payer: Medicare PPO | Admitting: Podiatry

## 2022-02-10 ENCOUNTER — Ambulatory Visit (INDEPENDENT_AMBULATORY_CARE_PROVIDER_SITE_OTHER): Payer: Medicare PPO

## 2022-02-10 ENCOUNTER — Encounter: Payer: Self-pay | Admitting: Podiatry

## 2022-02-10 DIAGNOSIS — M7671 Peroneal tendinitis, right leg: Secondary | ICD-10-CM

## 2022-02-10 DIAGNOSIS — E1169 Type 2 diabetes mellitus with other specified complication: Secondary | ICD-10-CM

## 2022-02-10 DIAGNOSIS — M216X2 Other acquired deformities of left foot: Secondary | ICD-10-CM

## 2022-02-10 DIAGNOSIS — M21621 Bunionette of right foot: Secondary | ICD-10-CM | POA: Diagnosis not present

## 2022-02-10 DIAGNOSIS — M216X1 Other acquired deformities of right foot: Secondary | ICD-10-CM

## 2022-02-10 MED ORDER — MELOXICAM 7.5 MG PO TABS: 7.5000 mg | ORAL_TABLET | Freq: Every day | ORAL | 0 refills | Status: DC

## 2022-02-10 NOTE — Progress Notes (Signed)
  Subjective:  Patient ID: Amanda Webster, female    DOB: [birth year],  MRN: [id number]  Chief Complaint  Patient presents with   Foot Pain    "I have a knot on the side of my right foot."    61 y.o. female presents with the above complaint. History confirmed with patient.  She has type 2 diabetes which is well controlled.  She has a painful area on the outside of the right foot that is been very tender and swollen especially when walking.  Objective:  Physical Exam: warm, good capillary refill, no trophic changes or ulcerative lesions, normal DP and PT pulses, normal monofilament exam, normal sensory exam, and she is a pes cavus foot type, prominent fifth metatarsal base bilateral the right side is much more tender, tailor's bunion noted bilateral as well, mild pain with resisted eversion on the right side, no pain in the retromalleolar groove or instability of the peroneal tendon course  Radiographs: Multiple views x-ray of both feet: no fracture, dislocation, swelling or degenerative changes noted and pes cavus foot type noted Assessment:   1. Peroneal tendinitis, right   2. Acquired bilateral pes cavus   3. Type 2 diabetes mellitus with other specified complication, without long-term current use of insulin ([place])      Plan:  Patient was evaluated and treated and all questions answered.  Discussed the etiology and treatment options for insertional peroneal tendinitis including stretching, formal physical therapy with an eccentric exercises therapy plan, supportive shoegears such as a running shoe or sneaker, bracing, topical and oral medications.  We also discussed that I do not routinely perform injections in this area because of the risk of an increased damage or rupture of the tendon.  We also discussed the role of surgical treatment of this for patients who do not improve after exhausting non-surgical treatment options.  -XR reviewed with patient -Educated on stretching and  icing of the affected limb. -Rx for Meloxicam. Advised on risks, benefits, and alternatives of the medication -Home therapy plan given -Recommended support immobilization in a Tri-Lock ankle brace which was dispensed today   Patient educated on diabetes. Discussed proper diabetic foot care and discussed risks and complications of disease. Educated patient in depth on reasons to return to the office immediately should he/she discover anything concerning or new on the feet. All questions answered. Discussed proper shoes as well.  I do think with her pes cavus foot type she could be at risk of ulceration.  I recommended multidensity insoles and extra-depth diabetic shoes which she has had before and is due for the new pair.  She will be fitted by our pedorthist for this and an order was placed.   Return in about 6 weeks (around 03/24/2022) for re-check peroneal tendinitis.

## 2022-02-10 NOTE — Patient Instructions (Signed)

## 2022-02-26 ENCOUNTER — Ambulatory Visit: Payer: Medicare PPO

## 2022-02-26 DIAGNOSIS — M216X1 Other acquired deformities of right foot: Secondary | ICD-10-CM

## 2022-02-26 DIAGNOSIS — E1169 Type 2 diabetes mellitus with other specified complication: Secondary | ICD-10-CM

## 2022-02-26 NOTE — Progress Notes (Signed)
SITUATION Reason for Consult: Evaluation for Prefabricated Diabetic Shoes and Custom Diabetic Inserts. Patient / Caregiver Report: Patient would like well fitting shoes  OBJECTIVE DATA: Patient History / Diagnosis:    ICD-10-CM   1. Type 2 diabetes mellitus with other specified complication, without long-term current use of insulin (HCC)  E11.69     2. Acquired bilateral pes cavus  M21.6X1    M21.6X2       Physician Treating Diabetes:  [name], MD  Current or Previous Devices:   None and no hsitory  In-Person Foot Examination: Ulcers & Callousing:   None Deformities:    Pes cavus Sensation:    Compromised  Shoe Size:     Ladies 8W  ORTHOTIC RECOMMENDATION Recommended Devices: - 1x pair prefabricated PDAC approved diabetic shoes; Patient Selected Apex [employer]s Janalee Dane boot Size 7W - 3x pair custom-to-patient PDAC approved vacuum formed diabetic insoles.  GOALS OF SHOES AND INSOLES - Reduce shear and pressure - Reduce / Prevent callus formation - Reduce / Prevent ulceration - Protect the fragile healing compromised diabetic foot.  Patient would benefit from diabetic shoes and inserts as patient has diabetes mellitus and the patient has one or more of the following conditions: - History of partial or complete amputation of the foot - History of previous foot ulceration. - History of pre-ulcerative callus - Peripheral neuropathy with evidence of callus formation - Foot deformity - Poor circulation  ACTIONS PERFORMED Potential out of pocket cost was communicated to patient. Patient understood and consented to measurement and casting. Patient was casted for insoles via crush box and measured for shoes via brannock device. Procedure was explained and patient tolerated procedure well. All questions were answered and concerns addressed. Casts were shipped to central fabrication for HOLD until Certificate of Medical Necessity or otherwise necessary authorization from  insurance is obtained.  PLAN Shoes are to be ordered and casts released from hold once all appropriate paperwork is complete. Patient is to be contacted and scheduled for fitting once shoes and insoles have been fabricated and received.

## 2022-03-03 ENCOUNTER — Telehealth: Payer: Self-pay

## 2022-03-03 DIAGNOSIS — Z0279 Encounter for issue of other medical certificate: Secondary | ICD-10-CM

## 2022-03-03 NOTE — Telephone Encounter (Signed)
Medical Clearance Eye Surgery Form In front folder appt   Copied Noted sleeved

## 2022-03-08 NOTE — Telephone Encounter (Signed)
Faxed forms [phone]

## 2022-03-11 ENCOUNTER — Ambulatory Visit: Payer: Medicare PPO | Admitting: Internal Medicine

## 2022-03-11 ENCOUNTER — Encounter: Payer: Self-pay | Admitting: Internal Medicine

## 2022-03-11 ENCOUNTER — Telehealth: Payer: Self-pay

## 2022-03-11 DIAGNOSIS — W57XXXA Bitten or stung by nonvenomous insect and other nonvenomous arthropods, initial encounter: Secondary | ICD-10-CM

## 2022-03-11 DIAGNOSIS — R42 Dizziness and giddiness: Secondary | ICD-10-CM

## 2022-03-11 DIAGNOSIS — S80862A Insect bite (nonvenomous), left lower leg, initial encounter: Secondary | ICD-10-CM

## 2022-03-11 MED ORDER — DOXYCYCLINE HYCLATE 100 MG PO TABS: 100.0000 mg | ORAL_TABLET | Freq: Two times a day (BID) | ORAL | 0 refills | Status: DC

## 2022-03-11 NOTE — Telephone Encounter (Signed)
Patient made a virtual visit 03-11-22 with Amanda Webster

## 2022-03-11 NOTE — Progress Notes (Signed)
Virtual Visit via Telephone Note   This visit type was conducted due to national recommendations for restrictions regarding the COVID-19 Pandemic (e.g. social distancing) in an effort to limit this patient's exposure and mitigate transmission in our community.  Due to her co-morbid illnesses, this patient is at least at moderate risk for complications without adequate follow up.  This format is felt to be most appropriate for this patient at this time.  The patient did not have access to video technology/had technical difficulties with video requiring transitioning to audio format only (telephone).  All issues noted in this document were discussed and addressed.  No physical exam could be performed with this format.  Evaluation Performed:  Follow-up visit  Date:  03/11/2022   ID:  Amanda Webster, DOB [birth year], MRN [id number]  Patient Location: Home Provider Location: Office/Clinic  Participants: Patient Location of Patient: Home Location of Provider: Telehealth Consent was obtain for visit to be over via telehealth. I verified that I am speaking with the correct person using two identifiers.  PCP:  [name], MD   Chief Complaint: Tick bite  History of Present Illness:    Amanda Webster is a 61 y.o. female who has a televsit for c/o episodes of fever, dizziness and fatigue for the last 2 days.  She reports noticing 2 ticks, 1 over her left ear and 1 over her left leg.  She denies noticing any rash, but her fever and fatigue have started after removing the ticks.  She denies any nausea or vomiting.  Her blood glucose has been WNL.  The patient does not have symptoms concerning for COVID-19 infection (fever, chills, cough, or new shortness of breath).   Past Medical, Surgical, Social History, Allergies, and Medications have been Reviewed.  Past Medical History:  Diagnosis Date   Allergy    Anxiety    Arnold-Chiari malformation (HCC)    Arthritis    Borderline  diabetic    Chronic headaches    Colon polyps    nonadenomatous   Depression    Diabetes mellitus without complication ([place])    Diverticulosis    Esophageal stricture    FROZEN LEFT SHOULDER 04/01/2010   Qualifier: Diagnosis of  By: Aline Brochure MD, Dorothyann Peng     GERD (gastroesophageal reflux disease)    HTN (hypertension)    Hyperlipidemia    IBS (irritable bowel syndrome)    Seizures ([place])    pt not sure when her last seizure was- she says she just spaces out- no shaking but she could not tell me last seizure-  on Oxtellar bid for seizures    Sleep apnea    no cpap now- did use 02 with cpap but has not used either in "years"   Past Surgical History:  Procedure Laterality Date   BREAST BIOPSY Left    cahri decompression  6 and 03/1999   CHOLECYSTECTOMY     COLONOSCOPY     invasive cervical traction  02/2009   left breast-lumpectomy     POLYPECTOMY     shunt put in on right side of brain     SHUNT REPLACEMENT  12/10   TOTAL ABDOMINAL HYSTERECTOMY  Age 75 yrs   TAH & BSO   TUBAL LIGATION     UPPER GASTROINTESTINAL ENDOSCOPY       Current Meds  Medication Sig   Accu-Chek Softclix Lancets lancets 1 each by Other route daily. as directed   AIMOVIG 70 MG/ML SOAJ Inject into  the skin every 30 (thirty) days.   amLODipine (NORVASC) 5 MG tablet Take 1 tablet (5 mg total) by mouth daily.   atorvastatin (LIPITOR) 20 MG tablet TAKE 1 TABLET EVERY DAY   azelastine (ASTELIN) 0.1 % nasal spray Place 1 spray into both nostrils daily at 12 noon.   cetirizine (ZYRTEC) 10 MG tablet TAKE 1 TABLET EVERY DAY   fluticasone (FLONASE) 50 MCG/ACT nasal spray Place 1 spray into both nostrils daily.   gabapentin (NEURONTIN) 300 MG capsule Take 600 mg by mouth at bedtime.   glucose blood (ACCU-CHEK AVIVA PLUS) test strip 1 each by Other route daily. Use as instructed   meloxicam (MOBIC) 7.5 MG tablet Take 1 tablet (7.5 mg total) by mouth daily.   naproxen sodium (ALEVE) 220 MG tablet Take 440 mg by mouth  daily as needed (for pain).   Oxcarbazepine (TRILEPTAL) 300 MG tablet Take 300 mg by mouth 2 (two) times daily.   polyethylene glycol powder (GLYCOLAX/MIRALAX) 17 GM/SCOOP powder Take 17 g by mouth 2 (two) times daily as needed.   propranolol (INDERAL) 60 MG tablet Take 1 tablet (60 mg total) by mouth every morning.   propranolol (INDERAL) 80 MG tablet Take 1 tablet (80 mg total) by mouth every evening.   Semaglutide, 1 MG/DOSE, (OZEMPIC, 1 MG/DOSE,) 4 MG/3ML SOPN INJECT 1 MG UNDER THE SKIN 1 TIME A WEEK   UNABLE TO FIND Aleve pain relieving lotion-daily   VITAMIN D PO Take 10,000 Units by mouth daily.     Allergies:   Cymbalta [duloxetine hcl], Methocarbamol, Propoxyphene n-acetaminophen, and Sulfa antibiotics   ROS:   Please see the history of present illness.     All other systems reviewed and are negative.   Labs/Other Tests and Data Reviewed:    Recent Labs: 02/03/2022: ALT 17; BUN 12; Creatinine, Ser 0.94; Hemoglobin 15.1; Platelets 209; Potassium 3.9; Sodium 142; TSH 0.684   Recent Lipid Panel Lab Results  Component Value Date/Time   CHOL 152 02/03/2022 02:01 PM   TRIG 228 (H) 02/03/2022 02:01 PM   HDL 47 02/03/2022 02:01 PM   CHOLHDL 3.2 02/03/2022 02:01 PM   CHOLHDL 4.0 03/04/2020 01:37 PM   LDLCALC 68 02/03/2022 02:01 PM   LDLCALC 96 03/04/2020 01:37 PM    Wt Readings from Last 3 Encounters:  02/03/22 190 lb 9.6 oz (86.5 kg)  12/25/21 196 lb (88.9 kg)  12/07/21 197 lb 9.6 oz (89.6 kg)     ASSESSMENT & PLAN:    Tick bite Dizziness Started empiric doxycycline as she has fever, fatigue and myalgias after tick bite Advised to maintain adequate hydration   Time:   Today, I have spent 9 minutes reviewing the chart, including problem list, medications, and with the patient with telehealth technology discussing the above problems.   Medication Adjustments/Labs and Tests Ordered: Current medicines are reviewed at length with the patient today.  Concerns regarding  medicines are outlined above.   Tests Ordered: No orders of the defined types were placed in this encounter.   Medication Changes: No orders of the defined types were placed in this encounter.    Note: This dictation was prepared with Dragon dictation along with smaller phrase technology. Similar sounding words can be transcribed inadequately or may not be corrected upon review. Any transcriptional errors that result from this process are unintentional.      Disposition:  Follow up  Signed, [name], MD  03/11/2022 1:54 PM     [facility]

## 2022-03-22 ENCOUNTER — Ambulatory Visit: Payer: Medicare PPO | Admitting: Internal Medicine

## 2022-03-22 ENCOUNTER — Encounter: Payer: Self-pay | Admitting: Internal Medicine

## 2022-03-22 VITALS — BP 124/78 | HR 57 | Ht 62.0 in | Wt 189.4 lb

## 2022-03-22 DIAGNOSIS — I10 Essential (primary) hypertension: Secondary | ICD-10-CM

## 2022-03-22 DIAGNOSIS — G43719 Chronic migraine without aura, intractable, without status migrainosus: Secondary | ICD-10-CM | POA: Diagnosis not present

## 2022-03-22 DIAGNOSIS — R519 Headache, unspecified: Secondary | ICD-10-CM | POA: Insufficient documentation

## 2022-03-22 DIAGNOSIS — Z8669 Personal history of other diseases of the nervous system and sense organs: Secondary | ICD-10-CM

## 2022-03-22 NOTE — Assessment & Plan Note (Addendum)
Since she has history of VP shunt and has new features of headache, will get MRI of brain Discussed with her neurologist-Dr. Merlene Laughter, agrees with current plan Also discussed about getting lumbar puncture, but he prefers to wait for MRI of brain for now

## 2022-03-22 NOTE — Assessment & Plan Note (Signed)
BP Readings from Last 1 Encounters:  03/22/22 124/78   Well-controlled with Amlodipine and Propranolol, but had intermittent low BP readings and orthostatic hypotension -needs to improve hydration Had decreased dose of amlodipine to 5 mg daily Counseled for compliance with the medications Advised DASH diet and moderate exercise/walking, at least 150 mins/week

## 2022-03-22 NOTE — Telephone Encounter (Signed)
Patient made an in office visit 03-22-22

## 2022-03-22 NOTE — Progress Notes (Signed)
Acute Office Visit  Subjective:    Patient ID: Amanda Webster, female    DOB: [birth year], 61 y.o.   MRN: [id number]  Chief Complaint  Patient presents with   pressure    Pressure at the back of the head. Been there for about a week and getting worse     HPI Patient is in today for complaint of occipital headache for the last 1 week, which is constant, dull or pressure-like, progressive, and is associated with dizziness and visual disturbance.  Of note, she was recently treated with doxycycline for acute sinusitis and tick bite.  She has history of migraine, for which she takes Aimovig.  But she states that her current headache is different than her usual migraine episode.  She has history of hydrocephalus and has had VP shunt placed in the past.  She denies any neck stiffness, chest pain, dyspnea, focal numbness or weakness.  Past Medical History:  Diagnosis Date   Allergy    Anxiety    Arnold-Chiari malformation (HCC)    Arthritis    Borderline diabetic    Chronic headaches    Colon polyps    nonadenomatous   Depression    Diabetes mellitus without complication ([place])    Diverticulosis    Esophageal stricture    FROZEN LEFT SHOULDER 04/01/2010   Qualifier: Diagnosis of  By: Aline Brochure MD, Dorothyann Peng     GERD (gastroesophageal reflux disease)    HTN (hypertension)    Hyperlipidemia    IBS (irritable bowel syndrome)    Seizures ([place])    pt not sure when her last seizure was- she says she just spaces out- no shaking but she could not tell me last seizure-  on Oxtellar bid for seizures    Sleep apnea    no cpap now- did use 02 with cpap but has not used either in "years"    Past Surgical History:  Procedure Laterality Date   BREAST BIOPSY Left    cahri decompression  6 and 03/1999   CHOLECYSTECTOMY     COLONOSCOPY     invasive cervical traction  02/2009   left breast-lumpectomy     POLYPECTOMY     shunt put in on right side of brain     SHUNT REPLACEMENT  12/10   TOTAL  ABDOMINAL HYSTERECTOMY  Age 38 yrs   TAH & BSO   TUBAL LIGATION     UPPER GASTROINTESTINAL ENDOSCOPY      Family History  Problem Relation Age of Onset   Colon polyps Mother    Breast cancer Mother    Lung cancer Mother    Heart disease Father    Stroke Father    Aortic aneurysm Father    Chiari malformation Daughter    Addison's disease Son    Breast cancer Maternal Aunt    Colon cancer Neg Hx    Esophageal cancer Neg Hx    Rectal cancer Neg Hx    Stomach cancer Neg Hx     Social History   Socioeconomic History   Marital status: Married    Spouse name: Not on file   Number of children: Not on file   Years of education: Not on file   Highest education level: Not on file  Occupational History   Not on file  Tobacco Use   Smoking status: Former    Packs/day: 1.50    Years: 30.00    Total pack years: 45.00    Types: Cigarettes  Quit date: 09/13/1990    Years since quitting: 31.5   Smokeless tobacco: Never  Vaping Use   Vaping Use: Never used  Substance and Sexual Activity   Alcohol use: No   Drug use: No   Sexual activity: Not Currently    Birth control/protection: Surgical    Comment: hyst  Other Topics Concern   Not on file  Social History Narrative   Disabled from Chiari malformation/seizures.. Drinks 4-5 cups of tea daily. Does not get regular exercise. Married for 40 years.   Social Determinants of Health   Financial Resource Strain: Medium Risk (07/09/2020)   Overall Financial Resource Strain (CARDIA)    Difficulty of Paying Living Expenses: Somewhat hard  Food Insecurity: No Food Insecurity (07/09/2020)   Hunger Vital Sign    Worried About Running Out of Food in the Last Year: Never true    Ran Out of Food in the Last Year: Never true  Transportation Needs: No Transportation Needs (07/09/2020)   PRAPARE - Hydrologist (Medical): No    Lack of Transportation (Non-Medical): No  Physical Activity: Sufficiently Active  (07/09/2020)   Exercise Vital Sign    Days of Exercise per Week: 7 days    Minutes of Exercise per Session: 30 min  Stress: No Stress Concern Present (07/09/2020)   [place]    Feeling of Stress : Not at all  Social Connections: Moderately Isolated (07/09/2020)   Social Connection and Isolation Panel [NHANES]    Frequency of Communication with Friends and Family: More than three times a week    Frequency of Social Gatherings with Friends and Family: Once a week    Attends Religious Services: Never    Marine scientist or Organizations: No    Attends Archivist Meetings: Never    Marital Status: Married  Human resources officer Violence: Not At Risk (07/09/2020)   Humiliation, Afraid, Rape, and Kick questionnaire    Fear of Current or Ex-Partner: No    Emotionally Abused: No    Physically Abused: No    Sexually Abused: No    Outpatient Medications Prior to Visit  Medication Sig Dispense Refill   Accu-Chek Softclix Lancets lancets 1 each by Other route daily. as directed 100 each 3   AIMOVIG 70 MG/ML SOAJ Inject into the skin every 30 (thirty) days.     amLODipine (NORVASC) 5 MG tablet Take 1 tablet (5 mg total) by mouth daily. 90 tablet 1   atorvastatin (LIPITOR) 20 MG tablet TAKE 1 TABLET EVERY DAY 90 tablet 0   cetirizine (ZYRTEC) 10 MG tablet TAKE 1 TABLET EVERY DAY 90 tablet 0   fluticasone (FLONASE) 50 MCG/ACT nasal spray Place 1 spray into both nostrils daily. 16 g 2   gabapentin (NEURONTIN) 300 MG capsule Take 600 mg by mouth at bedtime.     glucose blood (ACCU-CHEK AVIVA PLUS) test strip 1 each by Other route daily. Use as instructed 100 strip 3   meloxicam (MOBIC) 7.5 MG tablet Take 1 tablet (7.5 mg total) by mouth daily. 30 tablet 0   naproxen sodium (ALEVE) 220 MG tablet Take 440 mg by mouth daily as needed (for pain).     Omega-3 Fatty Acids (FISH OIL) 1000 MG CAPS Take 2 capsules by mouth daily.      Oxcarbazepine (TRILEPTAL) 300 MG tablet Take 300 mg by mouth 2 (two) times daily.     polyethylene glycol powder (GLYCOLAX/MIRALAX)  17 GM/SCOOP powder Take 17 g by mouth 2 (two) times daily as needed. 3350 g 1   propranolol (INDERAL) 60 MG tablet Take 1 tablet (60 mg total) by mouth every morning. 90 tablet 3   propranolol (INDERAL) 80 MG tablet Take 1 tablet (80 mg total) by mouth every evening. 30 tablet 3   Semaglutide, 1 MG/DOSE, (OZEMPIC, 1 MG/DOSE,) 4 MG/3ML SOPN INJECT 1 MG UNDER THE SKIN 1 TIME A WEEK 9 mL 1   UNABLE TO FIND Aleve pain relieving lotion-daily     VITAMIN D PO Take 10,000 Units by mouth daily.     azelastine (ASTELIN) 0.1 % nasal spray Place 1 spray into both nostrils daily at 12 noon. 30 mL 2   doxycycline (VIBRA-TABS) 100 MG tablet Take 1 tablet (100 mg total) by mouth 2 (two) times daily. 14 tablet 0   No facility-administered medications prior to visit.    Allergies  Allergen Reactions   Cymbalta [Duloxetine Hcl] Itching    Nasal itching   Methocarbamol    Propoxyphene N-Acetaminophen Itching   Sulfa Antibiotics     Review of Systems  Constitutional:  Negative for chills and fever.  HENT:  Negative for congestion, sinus pressure and sinus pain.   Eyes:  Negative for pain and discharge.  Respiratory:  Negative for cough and shortness of breath.   Cardiovascular:  Negative for chest pain and palpitations.  Gastrointestinal:  Negative for diarrhea, nausea and vomiting.  Genitourinary:  Negative for dysuria and hematuria.  Musculoskeletal:  Positive for arthralgias, back pain and gait problem. Negative for neck pain and neck stiffness.  Skin:  Negative for rash.  Neurological:  Positive for dizziness and headaches. Negative for weakness.  Psychiatric/Behavioral:  Negative for agitation.        Objective:    Physical Exam Vitals reviewed.  Constitutional:      General: She is not in acute distress.    Appearance: She is obese. She is not  diaphoretic.  HENT:     Head: Normocephalic and atraumatic.     Nose: Nose normal.     Mouth/Throat:     Mouth: Mucous membranes are moist.  Eyes:     General: No scleral icterus.    Extraocular Movements: Extraocular movements intact.  Cardiovascular:     Rate and Rhythm: Normal rate and regular rhythm.     Pulses: Normal pulses.     Heart sounds: Normal heart sounds. No murmur heard. Pulmonary:     Breath sounds: Normal breath sounds. No wheezing or rales.  Musculoskeletal:     Cervical back: Neck supple. No tenderness.     Right lower leg: No edema.     Left lower leg: No edema.  Skin:    General: Skin is warm.     Findings: No rash.  Neurological:     General: No focal deficit present.     Mental Status: She is alert and oriented to person, place, and time.     Cranial Nerves: No cranial nerve deficit.     Sensory: No sensory deficit.     Motor: No weakness.  Psychiatric:        Mood and Affect: Mood normal.        Behavior: Behavior normal.     BP 124/78   Pulse (!) 57   Ht '5\' 2"'$  (1.575 m)   Wt 189 lb 6.4 oz (85.9 kg)   SpO2 97%   BMI 34.64 kg/m  Wt Readings from Last  3 Encounters:  03/22/22 189 lb 6.4 oz (85.9 kg)  02/03/22 190 lb 9.6 oz (86.5 kg)  12/25/21 196 lb (88.9 kg)        Assessment & Plan:   Problem List Items Addressed This Visit       Cardiovascular and Mediastinum   Essential hypertension    BP Readings from Last 1 Encounters:  03/22/22 124/78  Well-controlled with Amlodipine and Propranolol, but had intermittent low BP readings and orthostatic hypotension -needs to improve hydration Had decreased dose of amlodipine to 5 mg daily Counseled for compliance with the medications Advised DASH diet and moderate exercise/walking, at least 150 mins/week      Migraine    On Aimovig and propranolol for ppx Followed by neurology        Other   History of Chiari malformation    S/p shunt formation Had hydrocephalus according to chart  review      Occipital headache - Primary    Since she has history of VP shunt and has new features of headache, will get MRI of brain Discussed with her neurologist-Dr. Merlene Laughter, agrees with current plan Also discussed about getting lumbar puncture, but he prefers to wait for MRI of brain for now      Relevant Orders   MR Brain Wo Contrast     No orders of the defined types were placed in this encounter.    [name], MD

## 2022-03-22 NOTE — Patient Instructions (Signed)
Please get MRI of brain done at [facility].  Please contact your Neurologist for evaluation of headache.

## 2022-03-22 NOTE — Assessment & Plan Note (Signed)
On Aimovig and propranolol for ppx Followed by neurology

## 2022-03-22 NOTE — Assessment & Plan Note (Signed)
S/p shunt formation Had hydrocephalus according to chart review

## 2022-03-23 DIAGNOSIS — R42 Dizziness and giddiness: Secondary | ICD-10-CM | POA: Diagnosis not present

## 2022-03-23 DIAGNOSIS — E6609 Other obesity due to excess calories: Secondary | ICD-10-CM | POA: Diagnosis not present

## 2022-03-23 DIAGNOSIS — I951 Orthostatic hypotension: Secondary | ICD-10-CM | POA: Diagnosis not present

## 2022-03-23 DIAGNOSIS — G43C1 Periodic headache syndromes in child or adult, intractable: Secondary | ICD-10-CM | POA: Diagnosis not present

## 2022-03-23 DIAGNOSIS — E1143 Type 2 diabetes mellitus with diabetic autonomic (poly)neuropathy: Secondary | ICD-10-CM | POA: Diagnosis not present

## 2022-03-23 DIAGNOSIS — R2681 Unsteadiness on feet: Secondary | ICD-10-CM | POA: Diagnosis not present

## 2022-03-24 ENCOUNTER — Ambulatory Visit: Payer: Medicare PPO | Admitting: Podiatry

## 2022-03-31 ENCOUNTER — Ambulatory Visit (HOSPITAL_COMMUNITY)
Admission: RE | Admit: 2022-03-31 | Discharge: 2022-03-31 | Disposition: A | Discharge status: Home / Self Care | Payer: Medicare PPO | Source: Ambulatory Visit | Attending: Internal Medicine | Admitting: Internal Medicine

## 2022-03-31 ENCOUNTER — Encounter (HOSPITAL_COMMUNITY): Payer: Self-pay

## 2022-03-31 DIAGNOSIS — R519 Headache, unspecified: Secondary | ICD-10-CM

## 2022-04-07 DIAGNOSIS — G919 Hydrocephalus, unspecified: Secondary | ICD-10-CM | POA: Diagnosis not present

## 2022-04-07 DIAGNOSIS — Z982 Presence of cerebrospinal fluid drainage device: Secondary | ICD-10-CM | POA: Diagnosis not present

## 2022-04-07 DIAGNOSIS — G918 Other hydrocephalus: Secondary | ICD-10-CM | POA: Diagnosis not present

## 2022-04-07 DIAGNOSIS — G935 Compression of brain: Secondary | ICD-10-CM | POA: Diagnosis not present

## 2022-04-07 DIAGNOSIS — Z981 Arthrodesis status: Secondary | ICD-10-CM | POA: Diagnosis not present

## 2022-04-30 NOTE — Telephone Encounter (Signed)
Mri scheduled

## 2022-05-05 ENCOUNTER — Encounter: Payer: Self-pay | Admitting: Internal Medicine

## 2022-05-05 DIAGNOSIS — G935 Compression of brain: Secondary | ICD-10-CM | POA: Diagnosis not present

## 2022-05-05 DIAGNOSIS — Z982 Presence of cerebrospinal fluid drainage device: Secondary | ICD-10-CM | POA: Diagnosis not present

## 2022-05-05 NOTE — Telephone Encounter (Signed)
Patient aware paperwork was sent back in July.

## 2022-05-10 ENCOUNTER — Encounter: Payer: Self-pay | Admitting: Internal Medicine

## 2022-05-12 ENCOUNTER — Ambulatory Visit: Payer: Medicare PPO | Admitting: Internal Medicine

## 2022-05-12 ENCOUNTER — Encounter: Payer: Self-pay | Admitting: Internal Medicine

## 2022-05-12 VITALS — BP 142/80 | HR 53 | Ht 62.0 in | Wt 188.6 lb

## 2022-05-12 DIAGNOSIS — R1084 Generalized abdominal pain: Secondary | ICD-10-CM | POA: Diagnosis not present

## 2022-05-12 DIAGNOSIS — K219 Gastro-esophageal reflux disease without esophagitis: Secondary | ICD-10-CM

## 2022-05-12 DIAGNOSIS — R197 Diarrhea, unspecified: Secondary | ICD-10-CM

## 2022-05-12 NOTE — Progress Notes (Signed)
HISTORY OF PRESENT ILLNESS:  Amanda Webster is a 61 y.o. female with multiple significant medical problems as listed below.  She has been followed in this office for GERD, constipation, and a history of multiple adenomatous colon polyps.  Last colonoscopy April 2023.  She contacted the office 2 days ago complaining of a several week history of abdominal cramping and diarrhea.  This appointment made.  Patient tells me that she started having problems with abdominal cramping and greenish stools after she ate some coleslaw.  The problem has persisted since.  This has been going on for couple of weeks.  She did start Mestinon about a month ago.  She denies bleeding.  She does take NSAIDs.  REVIEW OF SYSTEMS:  All non-GI ROS negative as otherwise stated in the HPI except for fatigue, muscle cramps, hearing problems, sleeping problems, fever, arthritis, back pain, visual change  Past Medical History:  Diagnosis Date   Allergy    Anxiety    Arnold-Chiari malformation (HCC)    Arthritis    Borderline diabetic    Chronic headaches    Colon polyps    nonadenomatous   Depression    Diabetes mellitus without complication ([place])    Diverticulosis    Esophageal stricture    FROZEN LEFT SHOULDER 04/01/2010   Qualifier: Diagnosis of  By: Aline Brochure MD, Dorothyann Peng     GERD (gastroesophageal reflux disease)    HTN (hypertension)    Hyperlipidemia    IBS (irritable bowel syndrome)    Seizures ([place])    pt not sure when her last seizure was- she says she just spaces out- no shaking but she could not tell me last seizure-  on Oxtellar bid for seizures    Sleep apnea    no cpap now- did use 02 with cpap but has not used either in "years"    Past Surgical History:  Procedure Laterality Date   BREAST BIOPSY Left    cahri decompression  6 and 03/1999   CHOLECYSTECTOMY     COLONOSCOPY     invasive cervical traction  02/2009   left breast-lumpectomy     POLYPECTOMY     shunt put in on right side of brain      SHUNT REPLACEMENT  12/10   TOTAL ABDOMINAL HYSTERECTOMY  Age 61 yrs   [place]  reports that she quit smoking about 31 years ago. Her smoking use included cigarettes. She has a 45.00 pack-year smoking history. She has never used smokeless tobacco. She reports that she does not drink alcohol and does not use drugs.  family history includes Addison's disease in her son; Aortic aneurysm in her father; Breast cancer in her maternal aunt and mother; Chiari malformation in her daughter; Colon polyps in her mother; Heart disease in her father; Lung cancer in her mother; Stroke in her father.  Allergies  Allergen Reactions   Cymbalta [Duloxetine Hcl] Itching    Nasal itching   Methocarbamol    Propoxyphene N-Acetaminophen Itching   Sulfa Antibiotics        PHYSICAL EXAMINATION: Vital signs: BP (!) 142/80   Pulse (!) 53   Ht '5\' 2"'$  (1.575 m)   Wt 188 lb 9.6 oz (85.5 kg)   SpO2 97%   BMI 34.50 kg/m   Constitutional: Chronically ill-appearing, no acute distress Psychiatric: alert and oriented x3, cooperative Eyes: extraocular movements  intact, anicteric, conjunctiva pink Mouth: oral pharynx moist, no lesions Neck: supple no lymphadenopathy Cardiovascular: heart regular rate and rhythm, no murmur Lungs: clear to auscultation bilaterally Abdomen: soft, nontender, nondistended, no obvious ascites, no peritoneal signs, normal bowel sounds, no organomegaly Rectal: Omitted Extremities: no clubbing or cyanosis.  Trace lower extremity edema bilaterally Skin: no lesions on visible extremities Neuro: No focal deficits.  Cranial nerves intact  ASSESSMENT:  1.  Abdominal cramping and loose stools of several weeks duration.  Likely secondary to Mestinon based on timing and known side effect profile of the drug. 2.  History of multiple adenomatous colon polyps.  Surveillance up-to-date 3.  Multiple  medical problems   PLAN:  1.  Stop Mestinon 2.  Contact her prescribing provider to let him know.  She agrees 3.  Contact this office the first of next week for follow-up.  If her problems have resolved, then no further work-up.  If persistent problems, then additional work-up.  She agrees. A total time of 30 minutes was spent preparing to see the patient, reviewing records, obtaining comprehensive history, performing medically appropriate physical examination, counseling and educating the patient regarding the above listed issues, directing medical therapy, and documenting clinical information in the health record.

## 2022-05-12 NOTE — Patient Instructions (Addendum)
_______________________________________________________  If you are age 59 or older, your body mass index should be between 23-30. Your Body mass index is 34.5 kg/m. If this is out of the aforementioned range listed, please consider follow up with your Primary Care Provider.  If you are age 61 or younger, your body mass index should be between 19-25. Your Body mass index is 34.5 kg/m. If this is out of the aformentioned range listed, please consider follow up with your Primary Care Provider.   Stop Mestinon.   Please call Tuesday with an update on how you are feeling.  The [place] [facility] providers would like to encourage you to use [facility] to communicate with providers for non-urgent requests or questions.  Due to long hold times on the telephone, sending your provider a message by [facility] may be a faster and more efficient way to get a response.  Please allow 48 business hours for a response.  Please remember that this is for non-urgent requests.   It was a pleasure to see you today!  Thank you for trusting me with your gastrointestinal care!

## 2022-05-18 ENCOUNTER — Other Ambulatory Visit (HOSPITAL_COMMUNITY): Payer: Medicare PPO

## 2022-05-19 ENCOUNTER — Telehealth: Payer: Self-pay | Admitting: Internal Medicine

## 2022-05-19 DIAGNOSIS — I951 Orthostatic hypotension: Secondary | ICD-10-CM | POA: Diagnosis not present

## 2022-05-19 DIAGNOSIS — G935 Compression of brain: Secondary | ICD-10-CM | POA: Diagnosis not present

## 2022-05-19 DIAGNOSIS — R42 Dizziness and giddiness: Secondary | ICD-10-CM | POA: Diagnosis not present

## 2022-05-19 DIAGNOSIS — E1143 Type 2 diabetes mellitus with diabetic autonomic (poly)neuropathy: Secondary | ICD-10-CM | POA: Diagnosis not present

## 2022-05-19 NOTE — Telephone Encounter (Signed)
This needs to go to Dr Blanch Media nurse, Vaughan Basta.

## 2022-05-19 NOTE — Telephone Encounter (Signed)
Pt states she was told to call Dr. Henrene Pastor back with an update regarding her diarrhea and stomach cramps. Since she stopped the Mestinon her symptoms have stopped. Dr. Henrene Pastor notified.

## 2022-05-19 NOTE — Telephone Encounter (Signed)
Pt states she has stopped taking Mestinon, says her stomach issues have subsisted.

## 2022-05-19 NOTE — Telephone Encounter (Signed)
I appreciate the update and glad to hear that she is feeling better

## 2022-05-21 ENCOUNTER — Ambulatory Visit (HOSPITAL_COMMUNITY)
Admission: RE | Admit: 2022-05-21 | Discharge: 2022-05-21 | Disposition: A | Discharge status: Home / Self Care | Payer: Medicare PPO | Source: Ambulatory Visit | Attending: Internal Medicine | Admitting: Internal Medicine

## 2022-05-21 DIAGNOSIS — R519 Headache, unspecified: Secondary | ICD-10-CM | POA: Insufficient documentation

## 2022-05-21 DIAGNOSIS — R42 Dizziness and giddiness: Secondary | ICD-10-CM | POA: Diagnosis not present

## 2022-05-21 DIAGNOSIS — Z4541 Encounter for adjustment and management of cerebrospinal fluid drainage device: Secondary | ICD-10-CM | POA: Diagnosis not present

## 2022-05-24 ENCOUNTER — Other Ambulatory Visit: Payer: Self-pay | Admitting: *Deleted

## 2022-05-24 DIAGNOSIS — I951 Orthostatic hypotension: Secondary | ICD-10-CM

## 2022-05-24 DIAGNOSIS — G43719 Chronic migraine without aura, intractable, without status migrainosus: Secondary | ICD-10-CM

## 2022-06-07 ENCOUNTER — Ambulatory Visit: Payer: Medicare PPO | Admitting: Internal Medicine

## 2022-06-07 ENCOUNTER — Encounter: Payer: Self-pay | Admitting: Internal Medicine

## 2022-06-07 VITALS — BP 154/92 | HR 64 | Resp 18 | Ht 62.0 in | Wt 191.0 lb

## 2022-06-07 DIAGNOSIS — E1169 Type 2 diabetes mellitus with other specified complication: Secondary | ICD-10-CM | POA: Diagnosis not present

## 2022-06-07 DIAGNOSIS — Z2821 Immunization not carried out because of patient refusal: Secondary | ICD-10-CM

## 2022-06-07 DIAGNOSIS — G43719 Chronic migraine without aura, intractable, without status migrainosus: Secondary | ICD-10-CM

## 2022-06-07 DIAGNOSIS — I10 Essential (primary) hypertension: Secondary | ICD-10-CM

## 2022-06-07 DIAGNOSIS — Z8669 Personal history of other diseases of the nervous system and sense organs: Secondary | ICD-10-CM | POA: Diagnosis not present

## 2022-06-07 DIAGNOSIS — E785 Hyperlipidemia, unspecified: Secondary | ICD-10-CM

## 2022-06-07 LAB — POCT GLYCOSYLATED HEMOGLOBIN (HGB A1C)
HbA1c POC (<> result, manual entry): 5.9 % (ref 4.0–5.6)
HbA1c, POC (controlled diabetic range): 5.9 % (ref 0.0–7.0)
HbA1c, POC (prediabetic range): 5.9 % (ref 5.7–6.4)

## 2022-06-07 MED ORDER — AMLODIPINE BESYLATE 10 MG PO TABS: 10.0000 mg | ORAL_TABLET | Freq: Every day | ORAL | 1 refills | Status: DC

## 2022-06-07 NOTE — Progress Notes (Signed)
Established Patient Office Visit  Subjective:  Patient ID: Amanda Webster, female    DOB: October 14, 1960  Age: 61 y.o. MRN: [id number]  CC:  Chief Complaint  Patient presents with   Follow-up    Follow up HTN DM and OSA pt wants to discuss ozempic as she heard this causes vision problems and wants to know if this is contributing to her vision problems    HPI Amanda Webster is a 61 y.o. female with past medical history of HTN, type II DM with HLD, polyarthritis, migraine, seizure disorder, allergic rhinitis and morbid obesity who presents for f/u of her chronic medical conditions.  HTN: BP is well-controlled. Takes medications regularly. Patient denies headache, chest pain, dyspnea or palpitations.  She has been having intermittent dizziness, and was found to have orthostatic hypotension in Dr. Ronal Fear office.  Of note, she admits that she has very poor p.o. intake of fluids.   Type II DM with HLD: Her HbA1C was 6.4 in the last visit.  She takes Ozempic 1 mg every week.  Her blood glucose has been around 150 and less most of the time. She denies any polyuria or polydipsia. She takes Lipitor as well.   She sees [facility] for h/o seizure disorder and migraine.  She was having repeated episodes of occipital headache, and had MRI of brain, which showed increased size of ventricles.  She has VP shunt in place for history of hydrocephalus.  She states that her headache frequency have improved lately.  Past Medical History:  Diagnosis Date   Allergy    Anxiety    Arnold-Chiari malformation (HCC)    Arthritis    Borderline diabetic    Chronic headaches    Colon polyps    nonadenomatous   Depression    Diabetes mellitus without complication (HCC)    Diverticulosis    Esophageal stricture    FROZEN LEFT SHOULDER 04/01/2010   Qualifier: Diagnosis of  By: Romeo Apple MD, Duffy Rhody     GERD (gastroesophageal reflux disease)    HTN (hypertension)    Hyperlipidemia    IBS (irritable  bowel syndrome)    Seizures (HCC)    pt not sure when her last seizure was- she says she just spaces out- no shaking but she could not tell me last seizure-  on Oxtellar bid for seizures    Sleep apnea    no cpap now- did use 02 with cpap but has not used either in "years"    Past Surgical History:  Procedure Laterality Date   BREAST BIOPSY Left    cahri decompression  6 and 03/1999   CHOLECYSTECTOMY     COLONOSCOPY     invasive cervical traction  02/2009   left breast-lumpectomy     POLYPECTOMY     shunt put in on right side of brain     SHUNT REPLACEMENT  12/10   TOTAL ABDOMINAL HYSTERECTOMY  Age 88 yrs   TAH & BSO   TUBAL LIGATION     UPPER GASTROINTESTINAL ENDOSCOPY      Family History  Problem Relation Age of Onset   Colon polyps Mother    Breast cancer Mother    Lung cancer Mother    Heart disease Father    Stroke Father    Aortic aneurysm Father    Chiari malformation Daughter    Addison's disease Son    Breast cancer Maternal Aunt    Colon cancer Neg Hx    Esophageal cancer Neg  Hx    Rectal cancer Neg Hx    Stomach cancer Neg Hx     Social History   Socioeconomic History   Marital status: Married    Spouse name: Not on file   Number of children: 3   Years of education: Not on file   Highest education level: Not on file  Occupational History   Not on file  Tobacco Use   Smoking status: Former    Packs/day: 1.50    Years: 30.00    Total pack years: 45.00    Types: Cigarettes    Quit date: 09/13/1990    Years since quitting: 31.7    Passive exposure: Never   Smokeless tobacco: Never  Vaping Use   Vaping Use: Never used  Substance and Sexual Activity   Alcohol use: No   Drug use: No   Sexual activity: Not Currently    Birth control/protection: Surgical    Comment: hyst  Other Topics Concern   Not on file  Social History Narrative   Disabled from Chiari malformation/seizures.. Drinks 4-5 cups of tea daily. Does not get regular exercise. Married  for 40 years.   Social Determinants of Health   Financial Resource Strain: Medium Risk (07/09/2020)   Overall Financial Resource Strain (CARDIA)    Difficulty of Paying Living Expenses: Somewhat hard  Food Insecurity: No Food Insecurity (07/09/2020)   Hunger Vital Sign    Worried About Running Out of Food in the Last Year: Never true    Ran Out of Food in the Last Year: Never true  Transportation Needs: No Transportation Needs (07/09/2020)   PRAPARE - Hydrologist (Medical): No    Lack of Transportation (Non-Medical): No  Physical Activity: Sufficiently Active (07/09/2020)   Exercise Vital Sign    Days of Exercise per Week: 7 days    Minutes of Exercise per Session: 30 min  Stress: No Stress Concern Present (07/09/2020)   [place]    Feeling of Stress : Not at all  Social Connections: Moderately Isolated (07/09/2020)   Social Connection and Isolation Panel [NHANES]    Frequency of Communication with Friends and Family: More than three times a week    Frequency of Social Gatherings with Friends and Family: Once a week    Attends Religious Services: Never    Marine scientist or Organizations: No    Attends Archivist Meetings: Never    Marital Status: Married  Human resources officer Violence: Not At Risk (07/09/2020)   Humiliation, Afraid, Rape, and Kick questionnaire    Fear of Current or Ex-Partner: No    Emotionally Abused: No    Physically Abused: No    Sexually Abused: No    Outpatient Medications Prior to Visit  Medication Sig Dispense Refill   Accu-Chek Softclix Lancets lancets 1 each by Other route daily. as directed 100 each 3   AIMOVIG 140 MG/ML SOAJ Inject 140 mg into the skin daily.     aspirin EC 81 MG tablet Take 1 tablet by mouth daily.     atorvastatin (LIPITOR) 20 MG tablet TAKE 1 TABLET EVERY DAY 90 tablet 0   cetirizine (ZYRTEC) 10 MG tablet TAKE 1  TABLET EVERY DAY 90 tablet 0   fluticasone (FLONASE) 50 MCG/ACT nasal spray Place 1 spray into both nostrils daily. 16 g 2   gabapentin (NEURONTIN) 300 MG capsule Take 600 mg by mouth at bedtime.  glucose blood (ACCU-CHEK AVIVA PLUS) test strip 1 each by Other route daily. Use as instructed 100 strip 3   meloxicam (MOBIC) 7.5 MG tablet Take 1 tablet (7.5 mg total) by mouth daily. 30 tablet 0   naproxen sodium (ALEVE) 220 MG tablet Take 440 mg by mouth daily as needed (for pain).     Omega-3 Fatty Acids (FISH OIL) 1000 MG CAPS Take 2 capsules by mouth daily.     Oxcarbazepine (TRILEPTAL) 300 MG tablet Take 300 mg by mouth 2 (two) times daily.     polyethylene glycol powder (GLYCOLAX/MIRALAX) 17 GM/SCOOP powder Take 17 g by mouth 2 (two) times daily as needed. 3350 g 1   Semaglutide, 1 MG/DOSE, (OZEMPIC, 1 MG/DOSE,) 4 MG/3ML SOPN INJECT 1 MG UNDER THE SKIN 1 TIME A WEEK 9 mL 1   UNABLE TO FIND Aleve pain relieving lotion-daily     VITAMIN D PO Take 10,000 Units by mouth daily.     amLODipine (NORVASC) 5 MG tablet Take 1 tablet (5 mg total) by mouth daily. 90 tablet 1   propranolol (INDERAL) 60 MG tablet Take 1 tablet (60 mg total) by mouth every morning. 90 tablet 3   pyridostigmine (MESTINON) 60 MG tablet Take 0.5 tablets by mouth 3 (three) times daily.     No facility-administered medications prior to visit.    Allergies  Allergen Reactions   Cymbalta [Duloxetine Hcl] Itching    Nasal itching   Methocarbamol    Propoxyphene N-Acetaminophen Itching   Sulfa Antibiotics     ROS Review of Systems  Constitutional:  Negative for chills and fever.  HENT:  Negative for congestion, sinus pressure and sinus pain.   Eyes:  Negative for pain and discharge.  Respiratory:  Negative for cough and shortness of breath.   Cardiovascular:  Negative for chest pain and palpitations.  Gastrointestinal:  Negative for diarrhea, nausea and vomiting.  Genitourinary:  Negative for dysuria and hematuria.   Musculoskeletal:  Positive for arthralgias, back pain and gait problem. Negative for neck pain and neck stiffness.  Skin:  Negative for rash.  Neurological:  Positive for dizziness and headaches. Negative for weakness.  Psychiatric/Behavioral:  Negative for agitation.       Objective:    Physical Exam Vitals reviewed.  Constitutional:      General: She is not in acute distress.    Appearance: She is obese. She is not diaphoretic.  HENT:     Head: Normocephalic and atraumatic.     Nose: Nose normal.     Mouth/Throat:     Mouth: Mucous membranes are moist.  Eyes:     General: No scleral icterus.    Extraocular Movements: Extraocular movements intact.  Cardiovascular:     Rate and Rhythm: Normal rate and regular rhythm.     Pulses: Normal pulses.     Heart sounds: Normal heart sounds. No murmur heard. Pulmonary:     Breath sounds: Normal breath sounds. No wheezing or rales.  Musculoskeletal:     Cervical back: Neck supple. No tenderness.     Right lower leg: No edema.     Left lower leg: No edema.  Skin:    General: Skin is warm.     Findings: No rash.  Neurological:     General: No focal deficit present.     Mental Status: She is alert and oriented to person, place, and time.     Cranial Nerves: No cranial nerve deficit.     Sensory: No  sensory deficit.     Motor: No weakness.  Psychiatric:        Mood and Affect: Mood normal.        Behavior: Behavior normal.     BP (!) 154/92 (BP Location: Right Arm, Cuff Size: Normal)   Pulse 64   Resp 18   Ht $R'5\' 2"'tG$  (1.575 m)   Wt 191 lb (86.6 kg)   SpO2 98%   BMI 34.93 kg/m  Wt Readings from Last 3 Encounters:  06/09/22 192 lb 3.2 oz (87.2 kg)  06/07/22 191 lb (86.6 kg)  05/12/22 188 lb 9.6 oz (85.5 kg)    Lab Results  Component Value Date   TSH 0.684 02/03/2022   Lab Results  Component Value Date   WBC 7.0 02/03/2022   HGB 15.1 02/03/2022   HCT 40.8 02/03/2022   MCV 89 02/03/2022   PLT 209 02/03/2022   Lab  Results  Component Value Date   NA 142 02/03/2022   K 3.9 02/03/2022   CO2 29 02/03/2022   GLUCOSE 145 (H) 02/03/2022   BUN 12 02/03/2022   CREATININE 0.94 02/03/2022   BILITOT 0.3 02/03/2022   ALKPHOS 162 (H) 02/03/2022   AST 17 02/03/2022   ALT 17 02/03/2022   PROT 6.6 02/03/2022   ALBUMIN 4.4 02/03/2022   CALCIUM 9.6 02/03/2022   ANIONGAP 8 03/25/2021   EGFR 70 02/03/2022   Lab Results  Component Value Date   CHOL 152 02/03/2022   Lab Results  Component Value Date   HDL 47 02/03/2022   Lab Results  Component Value Date   LDLCALC 68 02/03/2022   Lab Results  Component Value Date   TRIG 228 (H) 02/03/2022   Lab Results  Component Value Date   CHOLHDL 3.2 02/03/2022   Lab Results  Component Value Date   HGBA1C 5.9 06/07/2022   HGBA1C 5.9 06/07/2022   HGBA1C 5.9 06/07/2022      Assessment & Plan:   Problem List Items Addressed This Visit       Cardiovascular and Mediastinum   Essential hypertension - Primary    Uncontrolled with Amlodipine and Propranolo Had decreased dose of amlodipine to 5 mg daily as she was having episodes of hypotension, which was likely due to poor hydration, but now has elevated BP Increased dose of Amlodipine to 10 mg QD Counseled for compliance with the medications Advised DASH diet and moderate exercise/walking, at least 150 mins/week      Relevant Medications   amLODipine (NORVASC) 10 MG tablet   Migraine    On Aimovig and propranolol for ppx Followed by neurology      Relevant Medications   amLODipine (NORVASC) 10 MG tablet     Endocrine   Diabetes mellitus ([place])    Lab Results  Component Value Date   HGBA1C 5.9 06/07/2022   HGBA1C 5.9 06/07/2022   HGBA1C 5.9 06/07/2022  Well-controlled now with Ozempic 1 mg qw Advised to follow diabetic diet On statin F/u CMP and lipid panel Diabetic eye exam: Advised to follow up with Ophthalmology for diabetic eye exam      Relevant Orders   POCT glycosylated  hemoglobin (Hb A1C) (Completed)   Hyperlipidemia associated with type 2 diabetes mellitus (HCC)    On statin Check lipid profile      Relevant Medications   amLODipine (NORVASC) 10 MG tablet     Other   History of Chiari malformation    S/p shunt formation Had hydrocephalus according to chart  review Recent MRI brain showed increased size of ventricles, faxed report to neurosurgery-she needs to follow up with neurosurgery      Refused influenza vaccine    Meds ordered this encounter  Medications   amLODipine (NORVASC) 10 MG tablet    Sig: Take 1 tablet (10 mg total) by mouth daily.    Dispense:  90 tablet    Refill:  1    Dose change - 06/07/22    Follow-up: Return in about 2 months (around 08/07/2022) for HTN.    [name], MD

## 2022-06-07 NOTE — Patient Instructions (Signed)
Please take Amlodipine 10 mg instead of 5 mg.  Please continue taking other medications as prescribed.  Please continue to follow low carb diet and ambulate as tolerated.

## 2022-06-09 ENCOUNTER — Ambulatory Visit: Payer: Medicare PPO | Attending: Cardiology | Admitting: Cardiology

## 2022-06-09 ENCOUNTER — Telehealth: Payer: Self-pay | Admitting: Neurology

## 2022-06-09 ENCOUNTER — Encounter: Payer: Self-pay | Admitting: *Deleted

## 2022-06-09 ENCOUNTER — Encounter: Payer: Self-pay | Admitting: Cardiology

## 2022-06-09 VITALS — BP 122/94 | HR 58 | Ht 62.0 in | Wt 192.2 lb

## 2022-06-09 DIAGNOSIS — R079 Chest pain, unspecified: Secondary | ICD-10-CM

## 2022-06-09 DIAGNOSIS — Z01812 Encounter for preprocedural laboratory examination: Secondary | ICD-10-CM | POA: Diagnosis not present

## 2022-06-09 DIAGNOSIS — I10 Essential (primary) hypertension: Secondary | ICD-10-CM | POA: Diagnosis not present

## 2022-06-09 MED ORDER — METOPROLOL TARTRATE 25 MG PO TABS: 25.0000 mg | ORAL_TABLET | Freq: Once | ORAL | 0 refills | Status: DC

## 2022-06-09 NOTE — Progress Notes (Signed)
Clinical Summary Amanda Webster is a 61 y.o.female seen today for follow up of the following medical problems.    1. Chest pain   From prior visit:  - seen in ER 07/18/16 with CP - thought to be chestwall pain from bronchitis, coughing. Given z-pak. Prilosec given for GERD symptoms.   - pain started several months ago. Started a pressure, midchest. 5-6/10 in severity. Can have some diaphoresis. Can have some SOB. Occurs several times a week. Not positional. No relation to food. Symptoms can last up to 1 hour. Can occur at anytime. - can have frequent belching - DOE at 1 block, which is stable.    - CAD risk factors: DM2, previous history of HTN now off meds, ?HL, +tobacco in the past about 18 years total, father MIs late 57s to early 1s - cannot run on treadmill due to chronic back pain.   - normal stress test 08/2016   - pressure left sided at times, often with laying down. Heavier with deep breathing. Can last about 5-10 minutes. Some recent SOB/DOE      2.Palpitaitons - recently symptoms of chest pains, palpitations.  - + palpitations, pressure, +SOB. Typically occurs at night - Lasts 5-10 minutes. Can awake her from sleep - sedentary lifestyle.  - pepsi 16 oz per day, no coffee, no tea, no energy drinks, no EtOH.      10/2020 event monitor: rare ectopy isolated 4 beat run of SVT -mild symptoms.      3. OSA - does not wear cpap due to discomfort       4. HTN - she is compliantwith meds   5. Hyperlipidemia -06/2021 TC 169 TG 160 HDL 51 LDL 90   6. LE edema  Reprots recenty bilateral LE edema, mild DOE   01/2022 echo: LVEF 60-65%, no WMAs, normal diastolic function, normal RV. Mild MR.        On propranolol, history of migraines   Past Medical History:  Diagnosis Date   Allergy    Anxiety    Arnold-Chiari malformation ([place])    Arthritis    Borderline diabetic    Chronic headaches    Colon polyps    nonadenomatous   Depression    Diabetes mellitus  without complication ([place])    Diverticulosis    Esophageal stricture    FROZEN LEFT SHOULDER 04/01/2010   Qualifier: Diagnosis of  By: Aline Brochure MD, Dorothyann Peng     GERD (gastroesophageal reflux disease)    HTN (hypertension)    Hyperlipidemia    IBS (irritable bowel syndrome)    Seizures ([place])    pt not sure when her last seizure was- she says she just spaces out- no shaking but she could not tell me last seizure-  on Oxtellar bid for seizures    Sleep apnea    no cpap now- did use 02 with cpap but has not used either in "years"     Allergies  Allergen Reactions   Cymbalta [Duloxetine Hcl] Itching    Nasal itching   Methocarbamol    Propoxyphene N-Acetaminophen Itching   Sulfa Antibiotics      Current Outpatient Medications  Medication Sig Dispense Refill   Accu-Chek Softclix Lancets lancets 1 each by Other route daily. as directed 100 each 3   AIMOVIG 140 MG/ML SOAJ Inject 140 mg into the skin daily.     amLODipine (NORVASC) 10 MG tablet Take 1 tablet (10 mg total) by mouth daily. 90 tablet 1  aspirin EC 81 MG tablet Take 1 tablet by mouth daily.     atorvastatin (LIPITOR) 20 MG tablet TAKE 1 TABLET EVERY DAY 90 tablet 0   cetirizine (ZYRTEC) 10 MG tablet TAKE 1 TABLET EVERY DAY 90 tablet 0   fluticasone (FLONASE) 50 MCG/ACT nasal spray Place 1 spray into both nostrils daily. 16 g 2   gabapentin (NEURONTIN) 300 MG capsule Take 600 mg by mouth at bedtime.     glucose blood (ACCU-CHEK AVIVA PLUS) test strip 1 each by Other route daily. Use as instructed 100 strip 3   meloxicam (MOBIC) 7.5 MG tablet Take 1 tablet (7.5 mg total) by mouth daily. 30 tablet 0   naproxen sodium (ALEVE) 220 MG tablet Take 440 mg by mouth daily as needed (for pain).     Omega-3 Fatty Acids (FISH OIL) 1000 MG CAPS Take 2 capsules by mouth daily.     Oxcarbazepine (TRILEPTAL) 300 MG tablet Take 300 mg by mouth 2 (two) times daily.     polyethylene glycol powder (GLYCOLAX/MIRALAX) 17 GM/SCOOP powder Take 17  g by mouth 2 (two) times daily as needed. 3350 g 1   propranolol (INDERAL) 60 MG tablet Take 1 tablet (60 mg total) by mouth every morning. 90 tablet 3   Semaglutide, 1 MG/DOSE, (OZEMPIC, 1 MG/DOSE,) 4 MG/3ML SOPN INJECT 1 MG UNDER THE SKIN 1 TIME A WEEK 9 mL 1   UNABLE TO FIND Aleve pain relieving lotion-daily     VITAMIN D PO Take 10,000 Units by mouth daily.     No current facility-administered medications for this visit.     Past Surgical History:  Procedure Laterality Date   BREAST BIOPSY Left    cahri decompression  6 and 03/1999   CHOLECYSTECTOMY     COLONOSCOPY     invasive cervical traction  02/2009   left breast-lumpectomy     POLYPECTOMY     shunt put in on right side of brain     SHUNT REPLACEMENT  12/10   TOTAL ABDOMINAL HYSTERECTOMY  Age 69 yrs   TAH & BSO   TUBAL LIGATION     UPPER GASTROINTESTINAL ENDOSCOPY       Allergies  Allergen Reactions   Cymbalta [Duloxetine Hcl] Itching    Nasal itching   Methocarbamol    Propoxyphene N-Acetaminophen Itching   Sulfa Antibiotics       Family History  Problem Relation Age of Onset   Colon polyps Mother    Breast cancer Mother    Lung cancer Mother    Heart disease Father    Stroke Father    Aortic aneurysm Father    Chiari malformation Daughter    Addison's disease Son    Breast cancer Maternal Aunt    Colon cancer Neg Hx    Esophageal cancer Neg Hx    Rectal cancer Neg Hx    Stomach cancer Neg Hx      Social History Amanda Webster reports that she quit smoking about 31 years ago. Her smoking use included cigarettes. She has a 45.00 pack-year smoking history. She has never used smokeless tobacco. Amanda Webster reports no history of alcohol use.   Review of Systems CONSTITUTIONAL: No weight loss, fever, chills, weakness or fatigue.  HEENT: Eyes: No visual loss, blurred vision, double vision or yellow sclerae.No hearing loss, sneezing, congestion, runny nose or sore throat.  SKIN: No rash or itching.   CARDIOVASCULAR: per hpi RESPIRATORY: per hpi GASTROINTESTINAL: No anorexia, nausea, vomiting or diarrhea.  No abdominal pain or blood.  GENITOURINARY: No burning on urination, no polyuria NEUROLOGICAL: No headache, dizziness, syncope, paralysis, ataxia, numbness or tingling in the extremities. No change in bowel or bladder control.  MUSCULOSKELETAL: No muscle, back pain, joint pain or stiffness.  LYMPHATICS: No enlarged nodes. No history of splenectomy.  PSYCHIATRIC: No history of depression or anxiety.  ENDOCRINOLOGIC: No reports of sweating, cold or heat intolerance. No polyuria or polydipsia.  Marland Kitchen   Physical Examination Today's Vitals   06/09/22 1527  BP: (!) 122/94  Pulse: (!) 58  SpO2: 98%  Weight: 192 lb 3.2 oz (87.2 kg)  Height: '5\' 2"'$  (1.575 m)   Body mass index is 35.15 kg/m.  Gen: resting comfortably, no acute distress HEENT: no scleral icterus, pupils equal round and reactive, no palptable cervical adenopathy,  CV: RRR, no m/rg, no jvd Resp: Clear to auscultation bilaterally GI: abdomen is soft, non-tender, non-distended, normal bowel sounds, no hepatosplenomegaly MSK: extremities are warm, no edema.  Skin: warm, no rash Neuro:  no focal deficits Psych: appropriate affect   Diagnostic Studies  08/2016 Nuclear stress test No diagnostic ST segment changes to indicate ischemia. No significant myocardial perfusion defects to indicate scar or ischemia. This is a low risk study. Nuclear stress EF: 80%.   10/2020 monitor 7 day monitor No reported patient symptoms or triggered events Rare supraventricular and ventricular ectopy. Isolated 4 beat run of SVT     Patch Wear Time:  7 days and 2 hours (2022-02-02T15:09:51-0500 to 2022-02-09T17:54:08-499)   Patient had a min HR of 48 bpm, max HR of 114 bpm, and avg HR of 69 bpm. Predominant underlying rhythm was Sinus Rhythm. 1 run of Supraventricular Tachycardia occurred lasting 4 beats with a max rate of 114 bpm (avg  111 bpm). Isolated SVEs were rare  (<1.0%), SVE Couplets were rare (<1.0%), and SVE Triplets were rare (<1.0%). Isolated VEs were rare (<1.0%), VE Couplets were rare (<1.0%), and no VE Triplets were present.      01/2022 echo  1. Left ventricular ejection fraction, by estimation, is 60 to 65%. The  left ventricle has normal function. The left ventricle has no regional  wall motion abnormalities. Left ventricular diastolic parameters were  normal.   2. Right ventricular systolic function is normal. The right ventricular  size is normal. There is normal pulmonary artery systolic pressure. The  estimated right ventricular systolic pressure is [id number] mmHg.   3. The mitral valve is grossly normal. Mild mitral valve regurgitation.   4. The aortic valve is tricuspid. There is mild calcification of the  aortic valve. Aortic valve regurgitation is not visualized. Aortic valve  sclerosis is present, with no evidence of aortic valve stenosis. Aortic  valve mean gradient measures 2.5 mmHg.   5. The inferior vena cava is normal in size with greater than 50%  respiratory variability, suggesting right atrial pressure of 3 mmHg.   Assessment and Plan  1. Chest pain - plan for coronary CTA   2. HTN - at goal, continue current meds         Arnoldo Lenis, M.D.,

## 2022-06-09 NOTE — Telephone Encounter (Signed)
LVM and sent MyChart msg informing pt of appt change for 09/20/22 appt- MD out.

## 2022-06-09 NOTE — Patient Instructions (Addendum)
Medication Instructions:  Continue all current medications.  Labwork: BMET - order given today Please do just prior to CT   Testing/Procedures: Coronary CTA   Follow-Up: Office will contact with results via phone, letter or mychart.    6 months   Any Other Special Instructions Will Be Listed Below (If Applicable).   If you need a refill on your cardiac medications before your next appointment, please call your pharmacy.

## 2022-06-11 ENCOUNTER — Other Ambulatory Visit: Payer: Self-pay | Admitting: *Deleted

## 2022-06-11 ENCOUNTER — Encounter: Payer: Self-pay | Admitting: Cardiology

## 2022-06-11 ENCOUNTER — Other Ambulatory Visit: Payer: Self-pay | Admitting: Internal Medicine

## 2022-06-11 DIAGNOSIS — J310 Chronic rhinitis: Secondary | ICD-10-CM

## 2022-06-11 MED ORDER — PROPRANOLOL HCL 60 MG PO TABS: 60.0000 mg | ORAL_TABLET | Freq: Every morning | ORAL | 3 refills | Status: DC

## 2022-06-11 NOTE — Assessment & Plan Note (Addendum)
On Aimovig and propranolol for ppx Followed by neurology

## 2022-06-11 NOTE — Assessment & Plan Note (Signed)
On statin Check lipid profile 

## 2022-06-11 NOTE — Assessment & Plan Note (Addendum)
Uncontrolled with Amlodipine and Propranolo Had decreased dose of amlodipine to 5 mg daily as she was having episodes of hypotension, which was likely due to poor hydration, but now has elevated BP Increased dose of Amlodipine to 10 mg QD Counseled for compliance with the medications Advised DASH diet and moderate exercise/walking, at least 150 mins/week

## 2022-06-11 NOTE — Assessment & Plan Note (Signed)
Lab Results  Component Value Date   HGBA1C 5.9 06/07/2022   HGBA1C 5.9 06/07/2022   HGBA1C 5.9 06/07/2022   Well-controlled now with Ozempic 1 mg qw Advised to follow diabetic diet On statin F/u CMP and lipid panel Diabetic eye exam: Advised to follow up with Ophthalmology for diabetic eye exam

## 2022-06-11 NOTE — Telephone Encounter (Signed)
Refilled patient's Propranolol '60mg'$  every morning - # 90 +3 - sent to [facility].  She was confused about her dosing as she thought someone changed her back to '60mg'$  twice a day.  We did not - our dosing was still listed as Propraolol '60mg'$  every morning & '80mg'$  every evening.   She will check with neurology to make sure if he did it.  PCP did not either.   She will notify office after getting ahold of Dr. Merlene Laughter office.

## 2022-06-11 NOTE — Assessment & Plan Note (Signed)
S/p shunt formation Had hydrocephalus according to chart review Recent MRI brain showed increased size of ventricles, faxed report to neurosurgery-she needs to follow up with neurosurgery

## 2022-06-14 ENCOUNTER — Other Ambulatory Visit (INDEPENDENT_AMBULATORY_CARE_PROVIDER_SITE_OTHER): Payer: Self-pay | Admitting: Nurse Practitioner

## 2022-06-16 DIAGNOSIS — G935 Compression of brain: Secondary | ICD-10-CM | POA: Diagnosis not present

## 2022-06-16 DIAGNOSIS — Z982 Presence of cerebrospinal fluid drainage device: Secondary | ICD-10-CM | POA: Diagnosis not present

## 2022-06-18 ENCOUNTER — Telehealth: Payer: Self-pay | Admitting: Podiatry

## 2022-06-18 ENCOUNTER — Encounter: Payer: Self-pay | Admitting: Internal Medicine

## 2022-06-18 NOTE — Telephone Encounter (Signed)
Spoke with pt and informed her that her shoes and inserts are in. CMN has expired and I will be faxing it back over to her PCP for a new signature. Pt will await return call and schedule to pick up shoes and see Dr Sherryle Lis on the same day if possible due to transportation.

## 2022-06-25 ENCOUNTER — Telehealth (HOSPITAL_COMMUNITY): Payer: Self-pay | Admitting: *Deleted

## 2022-06-25 DIAGNOSIS — Z01812 Encounter for preprocedural laboratory examination: Secondary | ICD-10-CM | POA: Diagnosis not present

## 2022-06-25 DIAGNOSIS — I10 Essential (primary) hypertension: Secondary | ICD-10-CM | POA: Diagnosis not present

## 2022-06-25 DIAGNOSIS — R079 Chest pain, unspecified: Secondary | ICD-10-CM | POA: Diagnosis not present

## 2022-06-25 NOTE — Telephone Encounter (Signed)
Reaching out to patient to offer assistance regarding upcoming cardiac imaging study; pt verbalizes understanding of appt date/time, parking situation and where to check in, and verified current allergies; name and call back number provided for further questions should they arise  Gordy Clement RN Navigator Cardiac Imaging [facility] [phone] office [phone] cell  Patient to take daily medications. She is aware to obtain blood work prior to appointment and to arrive at 2:30pm.

## 2022-06-26 LAB — BASIC METABOLIC PANEL
BUN/Creatinine Ratio: 11 (ref 9–23)
BUN: 10 mg/dL (ref 6–24)
CO2: 23 mmol/L (ref 20–29)
Calcium: 9.7 mg/dL (ref 8.7–10.2)
Chloride: 100 mmol/L (ref 96–106)
Creatinine, Ser: 0.87 mg/dL (ref 0.57–1.00)
Glucose: 133 mg/dL — ABNORMAL HIGH (ref 70–99)
Potassium: 3.9 mmol/L (ref 3.5–5.2)
Sodium: 139 mmol/L (ref 134–144)
eGFR: 77 mL/min/{1.73_m2} (ref >59)

## 2022-06-28 ENCOUNTER — Ambulatory Visit (HOSPITAL_COMMUNITY)
Admission: RE | Admit: 2022-06-28 | Discharge: 2022-06-28 | Disposition: A | Discharge status: Home / Self Care | Payer: Medicare PPO | Source: Ambulatory Visit | Attending: Cardiology | Admitting: Cardiology

## 2022-06-28 DIAGNOSIS — R079 Chest pain, unspecified: Secondary | ICD-10-CM | POA: Insufficient documentation

## 2022-06-28 DIAGNOSIS — I2510 Atherosclerotic heart disease of native coronary artery without angina pectoris: Secondary | ICD-10-CM | POA: Diagnosis not present

## 2022-06-28 MED ORDER — NITROGLYCERIN 0.4 MG SL SUBL
0.8000 mg | SUBLINGUAL_TABLET | Freq: Once | SUBLINGUAL | Status: AC
  Administered 2022-06-28: 0.8 mg via SUBLINGUAL

## 2022-06-28 MED ORDER — NITROGLYCERIN 0.4 MG SL SUBL
SUBLINGUAL_TABLET | SUBLINGUAL | Status: AC
  Filled 2022-06-28: qty 2

## 2022-06-28 MED ORDER — IOHEXOL 350 MG/ML SOLN
95.0000 mL | Freq: Once | INTRAVENOUS | Status: AC | PRN
  Administered 2022-06-28: 95 mL via INTRAVENOUS

## 2022-07-02 ENCOUNTER — Other Ambulatory Visit: Payer: Self-pay | Admitting: Internal Medicine

## 2022-07-02 DIAGNOSIS — Z1231 Encounter for screening mammogram for malignant neoplasm of breast: Secondary | ICD-10-CM

## 2022-07-02 DIAGNOSIS — E1169 Type 2 diabetes mellitus with other specified complication: Secondary | ICD-10-CM

## 2022-07-05 DIAGNOSIS — H524 Presbyopia: Secondary | ICD-10-CM | POA: Diagnosis not present

## 2022-07-05 DIAGNOSIS — H16223 Keratoconjunctivitis sicca, not specified as Sjogren's, bilateral: Secondary | ICD-10-CM | POA: Diagnosis not present

## 2022-07-05 DIAGNOSIS — H2513 Age-related nuclear cataract, bilateral: Secondary | ICD-10-CM | POA: Diagnosis not present

## 2022-07-05 LAB — HM DIABETES EYE EXAM

## 2022-07-07 ENCOUNTER — Other Ambulatory Visit: Payer: Self-pay | Admitting: Internal Medicine

## 2022-07-07 DIAGNOSIS — E1169 Type 2 diabetes mellitus with other specified complication: Secondary | ICD-10-CM

## 2022-07-09 ENCOUNTER — Encounter: Payer: Self-pay | Admitting: *Deleted

## 2022-07-12 ENCOUNTER — Ambulatory Visit: Payer: Medicare PPO | Admitting: Podiatry

## 2022-07-12 DIAGNOSIS — M7671 Peroneal tendinitis, right leg: Secondary | ICD-10-CM | POA: Diagnosis not present

## 2022-07-12 DIAGNOSIS — M7661 Achilles tendinitis, right leg: Secondary | ICD-10-CM

## 2022-07-12 NOTE — Patient Instructions (Signed)

## 2022-07-13 NOTE — Progress Notes (Signed)
  Subjective:  Patient ID: Amanda Webster, female    DOB: [birth year],  MRN: [id number]  Chief Complaint  Patient presents with   Tendonitis    Follow up tendonitis right - She was a lot better, but now pain has come back in both feet   Diabetes    Questions about her diabetic shoes - confused about why she has been unable to get them since June 2023    61 y.o. female presents with the above complaint. History confirmed with patient.  Pain was doing much better for a while now it has returned and is in the outside of the foot as well as the back of the heel  Objective:  Physical Exam: warm, good capillary refill, no trophic changes or ulcerative lesions, normal DP and PT pulses, normal monofilament exam, normal sensory exam, and she is a pes cavus foot type, prominent fifth metatarsal base bilateral the right side is much more tender, tailor's bunion noted bilateral as well, mild pain with resisted eversion on the right side, no pain in the retromalleolar groove or instability of the peroneal tendon course, mostly isolated to the distal insertion, she does have pain in the distal Achilles at the insertion as well now.  Radiographs: Multiple views x-ray of both feet: no fracture, dislocation, swelling or degenerative changes noted and pes cavus foot type noted Assessment:   1. Peroneal tendinitis, right   2. Achilles tendinitis, right leg      Plan:  Patient was evaluated and treated and all questions answered.  Discussed the etiology and treatment options for insertional peroneal and Achilles tendinitis including stretching, formal physical therapy with an eccentric exercises therapy plan, supportive shoegears such as a running shoe or sneaker, bracing, topical and oral medications.  We also discussed that I do not routinely perform injections in this area because of the risk of an increased damage or rupture of the tendon.  We also discussed the role of surgical treatment of this for  patients who do not improve after exhausting non-surgical treatment options.  -Recommend she begin physical therapy.  Transportation is difficult and she is going to look into this.  Hopefully she will be able to get transportation through her insurance or local transit for healthcare in [place].  We will be able to send her to physical therapy there.  In the interim work on home therapy plan which I dispensed.  Paperwork was completed by her primary care doctor but had expired under the CMN.  This has been resent to their office and they are working on completing this so hopefully will build to dispense diabetic shoes soon.  Return if symptoms worsen or fail to improve.

## 2022-07-26 ENCOUNTER — Ambulatory Visit (INDEPENDENT_AMBULATORY_CARE_PROVIDER_SITE_OTHER): Payer: Medicare PPO | Admitting: *Deleted

## 2022-07-26 DIAGNOSIS — E1169 Type 2 diabetes mellitus with other specified complication: Secondary | ICD-10-CM

## 2022-07-26 DIAGNOSIS — G935 Compression of brain: Secondary | ICD-10-CM | POA: Diagnosis not present

## 2022-07-26 DIAGNOSIS — M216X1 Other acquired deformities of right foot: Secondary | ICD-10-CM

## 2022-07-26 DIAGNOSIS — G918 Other hydrocephalus: Secondary | ICD-10-CM | POA: Diagnosis not present

## 2022-07-26 DIAGNOSIS — G9389 Other specified disorders of brain: Secondary | ICD-10-CM | POA: Diagnosis not present

## 2022-07-26 DIAGNOSIS — H47092 Other disorders of optic nerve, not elsewhere classified, left eye: Secondary | ICD-10-CM | POA: Diagnosis not present

## 2022-07-26 DIAGNOSIS — M216X2 Other acquired deformities of left foot: Secondary | ICD-10-CM | POA: Diagnosis not present

## 2022-07-26 DIAGNOSIS — G919 Hydrocephalus, unspecified: Secondary | ICD-10-CM | POA: Diagnosis not present

## 2022-07-26 DIAGNOSIS — Z982 Presence of cerebrospinal fluid drainage device: Secondary | ICD-10-CM | POA: Diagnosis not present

## 2022-07-26 NOTE — Progress Notes (Signed)
Patient presents today to pick up diabetic shoes and insoles.  Patient was dispensed 1 pair of diabetic shoes and 3 pairs of foam casted diabetic insoles. Fit was satisfactory. Instructions for break-in and wear was reviewed and a copy was given to the patient.   Re-appointment for regularly scheduled diabetic foot care visits or if they should experience any trouble with the shoes or insoles.  

## 2022-08-06 DIAGNOSIS — E119 Type 2 diabetes mellitus without complications: Secondary | ICD-10-CM | POA: Diagnosis not present

## 2022-08-06 DIAGNOSIS — G935 Compression of brain: Secondary | ICD-10-CM | POA: Diagnosis not present

## 2022-08-06 DIAGNOSIS — G918 Other hydrocephalus: Secondary | ICD-10-CM | POA: Diagnosis not present

## 2022-08-06 DIAGNOSIS — R079 Chest pain, unspecified: Secondary | ICD-10-CM | POA: Diagnosis not present

## 2022-08-06 DIAGNOSIS — Z01812 Encounter for preprocedural laboratory examination: Secondary | ICD-10-CM | POA: Diagnosis not present

## 2022-08-10 ENCOUNTER — Encounter: Payer: Self-pay | Admitting: Podiatry

## 2022-08-10 DIAGNOSIS — G935 Compression of brain: Secondary | ICD-10-CM | POA: Diagnosis not present

## 2022-08-10 DIAGNOSIS — R42 Dizziness and giddiness: Secondary | ICD-10-CM | POA: Diagnosis not present

## 2022-08-10 DIAGNOSIS — E1143 Type 2 diabetes mellitus with diabetic autonomic (poly)neuropathy: Secondary | ICD-10-CM | POA: Diagnosis not present

## 2022-08-10 DIAGNOSIS — I951 Orthostatic hypotension: Secondary | ICD-10-CM | POA: Diagnosis not present

## 2022-08-12 ENCOUNTER — Encounter: Payer: Self-pay | Admitting: Internal Medicine

## 2022-08-12 ENCOUNTER — Ambulatory Visit: Payer: Medicare PPO | Admitting: Internal Medicine

## 2022-08-12 VITALS — BP 123/82 | HR 79 | Ht 62.0 in | Wt 194.0 lb

## 2022-08-12 DIAGNOSIS — Z8669 Personal history of other diseases of the nervous system and sense organs: Secondary | ICD-10-CM

## 2022-08-12 DIAGNOSIS — G919 Hydrocephalus, unspecified: Secondary | ICD-10-CM

## 2022-08-12 DIAGNOSIS — I10 Essential (primary) hypertension: Secondary | ICD-10-CM

## 2022-08-12 DIAGNOSIS — Z982 Presence of cerebrospinal fluid drainage device: Secondary | ICD-10-CM | POA: Diagnosis not present

## 2022-08-12 DIAGNOSIS — E1169 Type 2 diabetes mellitus with other specified complication: Secondary | ICD-10-CM

## 2022-08-12 DIAGNOSIS — G43719 Chronic migraine without aura, intractable, without status migrainosus: Secondary | ICD-10-CM

## 2022-08-12 NOTE — Assessment & Plan Note (Signed)
Has history of Arnold-Chiari malformation VP shunt for hydrocephalus, but had recent increased size of ventricles noted on MRI of brain Referred to neurosurgeon -planned to get a new VP shunt

## 2022-08-12 NOTE — Assessment & Plan Note (Addendum)
Lab Results  Component Value Date   HGBA1C 5.9 06/07/2022   HGBA1C 5.9 06/07/2022   HGBA1C 5.9 06/07/2022   Associated with HTN and HLD Well-controlled now with Ozempic 1 mg qw Advised to follow diabetic diet On statin F/u CMP and lipid panel Diabetic eye exam: Advised to follow up with Ophthalmology for diabetic eye exam

## 2022-08-12 NOTE — Progress Notes (Signed)
Established Patient Office Visit  Subjective:  Patient ID: Amanda Webster, female    DOB: 1961/06/19  Age: 61 y.o. MRN: [id number]  CC:  Chief Complaint  Patient presents with   Follow-up    Follow up Patient will have shunt replaced next week.    HPI Amanda Webster is a 61 y.o. female with past medical history of HTN, type II DM with HLD, polyarthritis, migraine, seizure disorder, allergic rhinitis and morbid obesity who presents for f/u of her chronic medical conditions.  HTN: BP is well-controlled. Takes medications regularly. Patient denies headache, chest pain, dyspnea or palpitations.  She had been having intermittent dizziness, and was found to have orthostatic hypotension in Dr. Freddie Apley office.  Of note, she admits that she has very poor p.o. intake of fluids, but she is trying to improve it.   Type II DM with HLD: Her HbA1C was 5.9 in the last visit.  She takes Ozempic 1 mg every week.  Her blood glucose has been around 150 and less most of the time. She denies any polyuria or polydipsia. She takes Lipitor as well.  She has seen [facility] for h/o seizure disorder and migraine.  She was having repeated episodes of occipital headache, and had MRI of brain, which showed increased size of ventricles.  She has VP shunt in place for history of hydrocephalus. She states that her headache frequency have improved lately. She is planned to get a new VP shunt.  Past Medical History:  Diagnosis Date   Allergy    Anxiety    Arnold-Chiari malformation (HCC)    Arthritis    Borderline diabetic    Chronic headaches    Colon polyps    nonadenomatous   Depression    Diabetes mellitus without complication ([place])    Diverticulosis    Esophageal stricture    FROZEN LEFT SHOULDER 04/01/2010   Qualifier: Diagnosis of  By: Aline Brochure MD, Dorothyann Peng     GERD (gastroesophageal reflux disease)    HTN (hypertension)    Hyperlipidemia    IBS (irritable bowel syndrome)    Seizures  ([place])    pt not sure when her last seizure was- she says she just spaces out- no shaking but she could not tell me last seizure-  on Oxtellar bid for seizures    Sleep apnea    no cpap now- did use 02 with cpap but has not used either in "years"    Past Surgical History:  Procedure Laterality Date   BREAST BIOPSY Left    cahri decompression  8 and 03/1999   CHOLECYSTECTOMY     COLONOSCOPY     invasive cervical traction  02/2009   left breast-lumpectomy     POLYPECTOMY     shunt put in on right side of brain     SHUNT REPLACEMENT  12/10   TOTAL ABDOMINAL HYSTERECTOMY  Age 55 yrs   TAH & BSO   TUBAL LIGATION     UPPER GASTROINTESTINAL ENDOSCOPY      Family History  Problem Relation Age of Onset   Colon polyps Mother    Breast cancer Mother    Lung cancer Mother    Heart disease Father    Stroke Father    Aortic aneurysm Father    Chiari malformation Daughter    Addison's disease Son    Breast cancer Maternal Aunt    Colon cancer Neg Hx    Esophageal cancer Neg Hx    Rectal cancer  Neg Hx    Stomach cancer Neg Hx     Social History   Socioeconomic History   Marital status: Married    Spouse name: Not on file   Number of children: 3   Years of education: Not on file   Highest education level: Not on file  Occupational History   Not on file  Tobacco Use   Smoking status: Former    Packs/day: 1.50    Years: 30.00    Total pack years: 45.00    Types: Cigarettes    Quit date: 09/13/1990    Years since quitting: 31.9    Passive exposure: Never   Smokeless tobacco: Never  Vaping Use   Vaping Use: Never used  Substance and Sexual Activity   Alcohol use: No   Drug use: No   Sexual activity: Not Currently    Birth control/protection: Surgical    Comment: hyst  Other Topics Concern   Not on file  Social History Narrative   Disabled from Chiari malformation/seizures.. Drinks 4-5 cups of tea daily. Does not get regular exercise. Married for 40 years.   Social  Determinants of Health   Financial Resource Strain: Medium Risk (07/09/2020)   Overall Financial Resource Strain (CARDIA)    Difficulty of Paying Living Expenses: Somewhat hard  Food Insecurity: No Food Insecurity (07/09/2020)   Hunger Vital Sign    Worried About Running Out of Food in the Last Year: Never true    Ran Out of Food in the Last Year: Never true  Transportation Needs: No Transportation Needs (07/09/2020)   PRAPARE - Hydrologist (Medical): No    Lack of Transportation (Non-Medical): No  Physical Activity: Sufficiently Active (07/09/2020)   Exercise Vital Sign    Days of Exercise per Week: 7 days    Minutes of Exercise per Session: 30 min  Stress: No Stress Concern Present (07/09/2020)   [place]    Feeling of Stress : Not at all  Social Connections: Moderately Isolated (07/09/2020)   Social Connection and Isolation Panel [NHANES]    Frequency of Communication with Friends and Family: More than three times a week    Frequency of Social Gatherings with Friends and Family: Once a week    Attends Religious Services: Never    Marine scientist or Organizations: No    Attends Archivist Meetings: Never    Marital Status: Married  Human resources officer Violence: Not At Risk (07/09/2020)   Humiliation, Afraid, Rape, and Kick questionnaire    Fear of Current or Ex-Partner: No    Emotionally Abused: No    Physically Abused: No    Sexually Abused: No    Outpatient Medications Prior to Visit  Medication Sig Dispense Refill   Accu-Chek Softclix Lancets lancets 1 each by Other route daily. as directed 100 each 3   AIMOVIG 140 MG/ML SOAJ Inject 140 mg into the skin daily.     amLODipine (NORVASC) 10 MG tablet Take 1 tablet (10 mg total) by mouth daily. 90 tablet 1   aspirin EC 81 MG tablet Take 1 tablet by mouth daily.     atorvastatin (LIPITOR) 20 MG tablet TAKE 1 TABLET  EVERY DAY 90 tablet 10   cetirizine (ZYRTEC) 10 MG tablet TAKE 1 TABLET EVERY DAY 90 tablet 0   fluticasone (FLONASE) 50 MCG/ACT nasal spray Place 1 spray into both nostrils daily. 16 g 2   gabapentin (  NEURONTIN) 300 MG capsule Take 600 mg by mouth at bedtime.     glucose blood (ACCU-CHEK AVIVA PLUS) test strip 1 each by Other route daily. Use as instructed 100 strip 3   meloxicam (MOBIC) 7.5 MG tablet Take 1 tablet (7.5 mg total) by mouth daily. 30 tablet 0   metoprolol tartrate (LOPRESSOR) 25 MG tablet Take 1 tablet (25 mg total) by mouth once for 1 dose. 1 tablet 0   naproxen sodium (ALEVE) 220 MG tablet Take 440 mg by mouth daily as needed (for pain).     Omega-3 Fatty Acids (FISH OIL) 1000 MG CAPS Take 2 capsules by mouth daily.     Oxcarbazepine (TRILEPTAL) 300 MG tablet Take 300 mg by mouth 2 (two) times daily.     polyethylene glycol powder (GLYCOLAX/MIRALAX) 17 GM/SCOOP powder Take 17 g by mouth 2 (two) times daily as needed. 3350 g 1   propranolol (INDERAL) 60 MG tablet Take 1 tablet (60 mg total) by mouth every morning. 90 tablet 3   Semaglutide, 1 MG/DOSE, (OZEMPIC, 1 MG/DOSE,) 4 MG/3ML SOPN INJECT 1MG UNDER THE SKIN ONE TIME WEEKLY 9 mL 10   UNABLE TO FIND Aleve pain relieving lotion-daily     VITAMIN D PO Take 10,000 Units by mouth daily.     No facility-administered medications prior to visit.    Allergies  Allergen Reactions   Cymbalta [Duloxetine Hcl] Itching    Nasal itching   Methocarbamol    Propoxyphene N-Acetaminophen Itching   Sulfa Antibiotics     ROS Review of Systems  Constitutional:  Negative for chills and fever.  HENT:  Negative for congestion, sinus pressure and sinus pain.   Eyes:  Negative for pain and discharge.  Respiratory:  Negative for cough and shortness of breath.   Cardiovascular:  Negative for chest pain and palpitations.  Gastrointestinal:  Negative for diarrhea, nausea and vomiting.  Genitourinary:  Negative for dysuria and hematuria.   Musculoskeletal:  Positive for arthralgias, back pain and gait problem. Negative for neck pain and neck stiffness.  Skin:  Negative for rash.  Neurological:  Positive for dizziness and headaches. Negative for weakness.  Psychiatric/Behavioral:  Negative for agitation.       Objective:    Physical Exam Vitals reviewed.  Constitutional:      General: She is not in acute distress.    Appearance: She is obese. She is not diaphoretic.  HENT:     Head: Normocephalic and atraumatic.     Nose: Nose normal.     Mouth/Throat:     Mouth: Mucous membranes are moist.  Eyes:     General: No scleral icterus.    Extraocular Movements: Extraocular movements intact.  Cardiovascular:     Rate and Rhythm: Normal rate and regular rhythm.     Pulses: Normal pulses.     Heart sounds: Normal heart sounds. No murmur heard. Pulmonary:     Breath sounds: Normal breath sounds. No wheezing or rales.  Musculoskeletal:     Cervical back: Neck supple. No tenderness.     Right lower leg: No edema.     Left lower leg: No edema.  Skin:    General: Skin is warm.     Findings: No rash.  Neurological:     General: No focal deficit present.     Mental Status: She is alert and oriented to person, place, and time.     Sensory: No sensory deficit.     Motor: No weakness.  Psychiatric:  Mood and Affect: Mood normal.        Behavior: Behavior normal.     BP 123/82 (BP Location: Right Arm, Patient Position: Sitting, Cuff Size: Large)   Pulse 79   Ht _0  (1.575 m)   Wt 194 lb (88 kg)   SpO2 90%   BMI 35.48 kg/m  Wt Readings from Last 3 Encounters:  08/12/22 194 lb (88 kg)  06/09/22 192 lb 3.2 oz (87.2 kg)  06/07/22 191 lb (86.6 kg)    Lab Results  Component Value Date   TSH 0.684 02/03/2022   Lab Results  Component Value Date   WBC 7.0 02/03/2022   HGB 15.1 02/03/2022   HCT 40.8 02/03/2022   MCV 89 02/03/2022   PLT 209 02/03/2022   Lab Results  Component Value Date   NA 139  06/25/2022   K 3.9 06/25/2022   CO2 23 06/25/2022   GLUCOSE 133 (H) 06/25/2022   BUN 10 06/25/2022   CREATININE 0.87 06/25/2022   BILITOT 0.3 02/03/2022   ALKPHOS 162 (H) 02/03/2022   AST 17 02/03/2022   ALT 17 02/03/2022   PROT 6.6 02/03/2022   ALBUMIN 4.4 02/03/2022   CALCIUM 9.7 06/25/2022   ANIONGAP 8 03/25/2021   EGFR 77 06/25/2022   Lab Results  Component Value Date   CHOL 152 02/03/2022   Lab Results  Component Value Date   HDL 47 02/03/2022   Lab Results  Component Value Date   LDLCALC 68 02/03/2022   Lab Results  Component Value Date   TRIG 228 (H) 02/03/2022   Lab Results  Component Value Date   CHOLHDL 3.2 02/03/2022   Lab Results  Component Value Date   HGBA1C 5.9 06/07/2022   HGBA1C 5.9 06/07/2022   HGBA1C 5.9 06/07/2022      Assessment & Plan:   Problem List Items Addressed This Visit       Cardiovascular and Mediastinum   Essential hypertension - Primary    Well-controlled with Amlodipine and Propranolol now Increased dose of Amlodipine to 10 mg QD in the last visit Counseled for compliance with the medications Advised DASH diet and moderate exercise/walking, at least 150 mins/week      Migraine    On Aimovig and propranolol for ppx Followed by neurology, going to see Dr. Krista Blue        Endocrine   Diabetes mellitus [facility])    Lab Results  Component Value Date   HGBA1C 5.9 06/07/2022   HGBA1C 5.9 06/07/2022   HGBA1C 5.9 06/07/2022  Associated with HTN and HLD Well-controlled now with Ozempic 1 mg qw Advised to follow diabetic diet On statin F/u CMP and lipid panel Diabetic eye exam: Advised to follow up with Ophthalmology for diabetic eye exam        Nervous and Auditory   Hydrocephalus ([place])    Has history of Arnold-Chiari malformation VP shunt for hydrocephalus, but had recent increased size of ventricles noted on MRI of brain Referred to neurosurgeon -planned to get a new VP shunt        Other   History of Chiari  malformation    S/p VP shunt placement, but had recent increasing size of ventricles Needs VP shunt revision, followed by neurosurgeon      S/P VP shunt    No orders of the defined types were placed in this encounter.   Follow-up: Return in about 3 months (around 11/11/2022) for HTN and DM.    [name], MD

## 2022-08-12 NOTE — Patient Instructions (Signed)
Please continue taking medications as prescribed.  Please continue to follow low carb diet and ambulate as tolerated. 

## 2022-08-12 NOTE — Assessment & Plan Note (Signed)
S/p VP shunt placement, but had recent increasing size of ventricles Needs VP shunt revision, followed by neurosurgeon

## 2022-08-12 NOTE — Assessment & Plan Note (Signed)
Well-controlled with Amlodipine and Propranolol now Increased dose of Amlodipine to 10 mg QD in the last visit Counseled for compliance with the medications Advised DASH diet and moderate exercise/walking, at least 150 mins/week 

## 2022-08-12 NOTE — Assessment & Plan Note (Signed)
On Aimovig and propranolol for ppx Followed by neurology, going to see Dr. Krista Blue

## 2022-08-20 DIAGNOSIS — T8501XA Breakdown (mechanical) of ventricular intracranial (communicating) shunt, initial encounter: Secondary | ICD-10-CM | POA: Diagnosis not present

## 2022-08-20 DIAGNOSIS — J309 Allergic rhinitis, unspecified: Secondary | ICD-10-CM | POA: Diagnosis not present

## 2022-08-20 DIAGNOSIS — Z982 Presence of cerebrospinal fluid drainage device: Secondary | ICD-10-CM | POA: Diagnosis not present

## 2022-08-20 DIAGNOSIS — E669 Obesity, unspecified: Secondary | ICD-10-CM | POA: Diagnosis not present

## 2022-08-20 DIAGNOSIS — E785 Hyperlipidemia, unspecified: Secondary | ICD-10-CM | POA: Diagnosis not present

## 2022-08-20 DIAGNOSIS — T8509XA Other mechanical complication of ventricular intracranial (communicating) shunt, initial encounter: Secondary | ICD-10-CM | POA: Diagnosis not present

## 2022-08-20 DIAGNOSIS — E119 Type 2 diabetes mellitus without complications: Secondary | ICD-10-CM | POA: Diagnosis not present

## 2022-08-20 DIAGNOSIS — G910 Communicating hydrocephalus: Secondary | ICD-10-CM | POA: Diagnosis not present

## 2022-08-20 DIAGNOSIS — I951 Orthostatic hypotension: Secondary | ICD-10-CM | POA: Diagnosis not present

## 2022-08-20 DIAGNOSIS — G4733 Obstructive sleep apnea (adult) (pediatric): Secondary | ICD-10-CM | POA: Diagnosis not present

## 2022-08-20 DIAGNOSIS — I10 Essential (primary) hypertension: Secondary | ICD-10-CM | POA: Diagnosis not present

## 2022-08-23 ENCOUNTER — Encounter: Payer: Self-pay | Admitting: *Deleted

## 2022-08-23 ENCOUNTER — Telehealth: Payer: Self-pay | Admitting: *Deleted

## 2022-08-23 NOTE — Patient Outreach (Signed)
  Care Coordination [facility] Note Transition Care Management Follow-up Telephone Call Date of discharge and from where: [facility] on 08/21/22 How have you been since you were released from the hospital? "I'm doing better. Still having some pain in my stomach. I didn't expect them to change the tube in my stomach" Any questions or concerns? No  Items Reviewed: Did the pt receive and understand the discharge instructions provided? Yes . Patient has instructions and contact number to use if she has any questions or concerns. Medications obtained and verified? Yes  Other? Yes  Discussed splinting abd with pillow for cough/sneezing and to move carefully. Discussed surgical site wound care and monitoring or s/s of infection. Reviewed instructions for cleaning around surgical sites.  Any new allergies since your discharge? No  Dietary orders reviewed? Yes Do you have support at home? Yes   Home Care and Equipment/Supplies: Were home health services ordered? no If so, what is the name of the agency?   Has the agency set up a time to come to the patient's home? not applicable Were any new equipment or medical supplies ordered?  No What is the name of the medical supply agency?  Were you able to get the supplies/equipment? not applicable Do you have any questions related to the use of the equipment or supplies? No  Functional Questionnaire: (I = Independent and D = Dependent) ADLs: I  Bathing/Dressing- I  Meal Prep- I  Eating- I  Maintaining continence- I  Transferring/Ambulation- I  Managing Meds- I  Follow up appointments reviewed:  PCP Hospital f/u appt confirmed?  Not indicated [facility] f/u appt confirmed? Yes  Scheduled to see Alveria Apley, RN (neuro clinical support) on 09/02/22 at 1:00 and Angelene Giovanni MD on 09/20/22 at 8:30  Are transportation arrangements needed? No  If their condition worsens, is the pt aware to call PCP or go to the Emergency Dept.? Yes Was the patient  provided with contact information for the PCP's office or ED? Yes Was to pt encouraged to call back with questions or concerns? Yes  SDOH assessments and interventions completed:   Yes SDOH Interventions Today    Flowsheet Row Most Recent Value  SDOH Interventions   Housing Interventions Intervention Not Indicated  Transportation Interventions Intervention Not Indicated  Financial Strain Interventions Intervention Not Indicated       Care Coordination Interventions:  No Care Coordination interventions needed at this time.   Encounter Outcome:  Pt. Visit Completed    Chong Sicilian, BSN, RN-BC RN Care Coordinator [place] Direct Dial: [phone] Main #: [phone]

## 2022-08-24 ENCOUNTER — Ambulatory Visit: Payer: Medicare PPO

## 2022-09-02 DIAGNOSIS — Z4802 Encounter for removal of sutures: Secondary | ICD-10-CM | POA: Diagnosis not present

## 2022-09-03 ENCOUNTER — Encounter: Payer: Self-pay | Admitting: Internal Medicine

## 2022-09-03 NOTE — Telephone Encounter (Signed)
Left message on voicemail to contact our office to schedule acute visit.

## 2022-09-07 ENCOUNTER — Ambulatory Visit: Payer: Medicare PPO | Admitting: Internal Medicine

## 2022-09-07 ENCOUNTER — Encounter: Payer: Self-pay | Admitting: Internal Medicine

## 2022-09-07 VITALS — BP 143/85 | HR 67 | Resp 16 | Ht 62.0 in | Wt 187.0 lb

## 2022-09-07 DIAGNOSIS — R1030 Lower abdominal pain, unspecified: Secondary | ICD-10-CM

## 2022-09-07 NOTE — Patient Instructions (Signed)
Thank you, Amanda Webster for allowing [place] to provide your care today. Today we discussed lower abdominal pain, increased urination, and pain with urination.    I have ordered the following labs for you:  Lab Orders  No laboratory test(s) ordered today     Tests ordered today:  Urine analysis   Referrals ordered today:   Referral Orders  No referral(s) requested today     I have ordered the following medication/changed the following medications:   Stop the following medications: There are no discontinued medications.   Start the following medications: No orders of the defined types were placed in this encounter.    Follow up:  If pain worsens or does not go away    Your urine study was normal. It is possible you had some inflammation from urinary catheter. Use AZO as needed for a few days.    Amanda Webster, M.D.

## 2022-09-07 NOTE — Progress Notes (Signed)
     HPI:Amanda Webster is a 61 y.o. female who presents for evaluation of lower abdominal pain. For the details of today's visit, please refer to the assessment and plan.  Past Medical History:  Diagnosis Date   Allergy    Anxiety    Arnold-Chiari malformation (HCC)    Arthritis    Borderline diabetic    Chronic headaches    Colon polyps    nonadenomatous   Depression    Diabetes mellitus without complication (HCC)    Diverticulosis    Esophageal stricture    FROZEN LEFT SHOULDER 04/01/2010   Qualifier: Diagnosis of  By: Romeo Apple MD, Duffy Rhody     GERD (gastroesophageal reflux disease)    HTN (hypertension)    Hyperlipidemia    IBS (irritable bowel syndrome)    Seizures (HCC)    pt not sure when her last seizure was- she says she just spaces out- no shaking but she could not tell me last seizure-  on Oxtellar bid for seizures    Sleep apnea    no cpap now- did use 02 with cpap but has not used either in "years"     Physical Exam: Vitals:   09/07/22 1121  BP: (!) 143/85  Pulse: 67  Resp: 16  SpO2: 94%  Weight: 187 lb (84.8 kg)  Height: 5\' 2"  (1.575 m)     Physical Exam Constitutional:      Appearance: She is well-developed and well-groomed. She is obese.  Cardiovascular:     Rate and Rhythm: Normal rate and regular rhythm.  Pulmonary:     Effort: Pulmonary effort is normal.     Breath sounds: Normal breath sounds.  Abdominal:     General: Bowel sounds are normal. There is no distension.     Palpations: Abdomen is soft.     Tenderness: There is no abdominal tenderness. There is no right CVA tenderness, left CVA tenderness, guarding or rebound.      Assessment & Plan:   Lower abdominal pain Patient had shunt revision at [facility] on 12/8. She has had lower abdominal pain for 1 to 2 weeks.Patient complains of dysuria and frequency since Saturday. She has had symptoms for 4 days. Patient denies congestion, cough, fever, and vaginal discharge. Patient does have  a history of recurrent UTI, but not in many years.  Patient does not have a history of pyelonephritis. Not sexually active. Pain has improved with AZO.  Assessment/Plan: UA nl. Patient possibly had catheter placement during procedure causing irritation. I recommended using AZO for only a few days and if pain returns follow up for more evaluation. Also recommend following up with surgeon if pain continues and started after shunt revision.     Milus Banister, MD

## 2022-09-08 ENCOUNTER — Ambulatory Visit
Admission: RE | Admit: 2022-09-08 | Discharge: 2022-09-08 | Disposition: A | Discharge status: Home / Self Care | Payer: Medicare PPO | Source: Ambulatory Visit | Attending: Internal Medicine | Admitting: Internal Medicine

## 2022-09-08 DIAGNOSIS — R1030 Lower abdominal pain, unspecified: Secondary | ICD-10-CM | POA: Insufficient documentation

## 2022-09-08 DIAGNOSIS — Z1231 Encounter for screening mammogram for malignant neoplasm of breast: Secondary | ICD-10-CM | POA: Diagnosis not present

## 2022-09-08 NOTE — Assessment & Plan Note (Addendum)
Patient had shunt revision at [facility] on 12/8. She has had lower abdominal pain for 1 to 2 weeks.Patient complains of dysuria and frequency since Saturday. She has had symptoms for 4 days. Patient denies congestion, cough, fever, and vaginal discharge. Patient does have a history of recurrent UTI, but not in many years.  Patient does not have a history of pyelonephritis. Not sexually active. Pain has improved with AZO.  Assessment/Plan: UA nl. Patient possibly had catheter placement during procedure causing irritation. I recommended using AZO for only a few days and if pain returns follow up for more evaluation. Also recommend following up with surgeon if pain continues and started after shunt revision.

## 2022-09-20 ENCOUNTER — Ambulatory Visit: Payer: Medicare PPO | Admitting: Neurology

## 2022-09-20 DIAGNOSIS — Z4541 Encounter for adjustment and management of cerebrospinal fluid drainage device: Secondary | ICD-10-CM | POA: Diagnosis not present

## 2022-09-21 ENCOUNTER — Encounter: Payer: Self-pay | Admitting: Neurology

## 2022-09-21 ENCOUNTER — Ambulatory Visit: Payer: Medicare PPO | Admitting: Neurology

## 2022-09-21 VITALS — BP 130/82 | HR 76 | Ht 62.0 in | Wt 184.0 lb

## 2022-09-21 DIAGNOSIS — E1142 Type 2 diabetes mellitus with diabetic polyneuropathy: Secondary | ICD-10-CM

## 2022-09-21 DIAGNOSIS — G40909 Epilepsy, unspecified, not intractable, without status epilepticus: Secondary | ICD-10-CM | POA: Diagnosis not present

## 2022-09-21 DIAGNOSIS — R269 Unspecified abnormalities of gait and mobility: Secondary | ICD-10-CM

## 2022-09-21 DIAGNOSIS — G43709 Chronic migraine without aura, not intractable, without status migrainosus: Secondary | ICD-10-CM | POA: Diagnosis not present

## 2022-09-21 DIAGNOSIS — Z982 Presence of cerebrospinal fluid drainage device: Secondary | ICD-10-CM | POA: Diagnosis not present

## 2022-09-21 NOTE — Progress Notes (Unsigned)
Chief Complaint  Patient presents with   New Patient (Initial Visit)    NP internal referral for chronic hedaches and orthostatic hypotension. TOC from Amanda Webster      ASSESSMENT AND PLAN  Amanda Webster is a 62 y.o. female   History of Chiari decompression, placement of VP shunt  Revision by [facility] Dr. Salomon Webster on August 20, 2022,  Her gait seems to improve after revision  Hyperreflexia in the setting of length-dependent sensory changes,  Likely diabetic peripheral neuropathy, EMG nerve conduction study  MRI of the cervical, to rule out superimposed cervical spondylitic myelopathy Reported history of seizure,  Spacing spells,  On Trileptal  EEG  DIAGNOSTIC DATA (LABS, IMAGING, TESTING) - I reviewed patient records, labs, notes, testing and imaging myself where available.   MEDICAL HISTORY:  Amanda Webster, seen in request by   Amanda Spar, MD   I reviewed and summarized the referring note. PMHX HTN HLD DM for more than 10 years. Anxiety Seizure, space out, last one was in Dec 2023, brought on by stress Obstructive sleep apnea, could not tolerate CPAP. VP shunt   Bad headaches, losing strength in her hands in 2000, surgery was helpfu.  She has complications, spinal leakage, keep her from leaking fluid, go back to work, she drove buses, Administrator, arts, she worked for 10 years, around 2010.  She has history of Chiari decompression and placement of Medtronic Strata II V-P shunt. She is status post V-P shunt revision with Dr. Salomon Webster on 12.08.2023. She reports seeing improvement in balance, but the improvement has not been as significant over the past week. Her incision is well-healed. Her last known shunt setting was "1.0".   She seizure, few second space out, really weak and goes to sleep. Started after she had sugery. Only few seconds.  She is abble to walk better,    PHYSICAL EXAM:   Vitals:   09/21/22 1418  BP: 130/82  Pulse: 76  Weight:  184 lb (83.5 kg)  Height: '5\' 2"'$  (1.575 m)   Not recorded     Body mass index is 33.65 kg/m.  PHYSICAL EXAMNIATION:  Gen: NAD, conversant, well nourised, well groomed                     Cardiovascular: Regular rate rhythm, no peripheral edema, warm, nontender. Eyes: Conjunctivae clear without exudates or hemorrhage Neck: Supple, no carotid bruits. Pulmonary: Clear to auscultation bilaterally   NEUROLOGICAL EXAM:  MENTAL STATUS: Speech/cognition: Awake, alert, oriented to history taking and casual conversation CRANIAL NERVES: CN II: Visual fields are full to confrontation. Pupils are round equal and briskly reactive to light. CN III, IV, VI: extraocular movement are normal. No ptosis. CN V: Facial sensation is intact to light touch CN VII: Face is symmetric with normal eye closure  CN VIII: Hearing is normal to causal conversation. CN IX, X: Phonation is normal. CN XI: Head turning and shoulder shrug are intact  MOTOR: There is no pronator drift of out-stretched arms. Muscle bulk and tone are normal. Muscle strength is normal.  REFLEXES: Reflexes are 2+ and symmetric at the biceps, triceps, knees, and trace ankles. Plantar responses are extensor bilaterally  SENSORY: Length-dependent decreased light touch, pinprick to mid shin level  COORDINATION: There is no trunk or limb dysmetria noted.  GAIT/STANCE: Needs push-up to get up from seated position, cautious  REVIEW OF SYSTEMS:  Full 14 system review of systems performed and notable only for as  above All other review of systems were negative.   ALLERGIES: Allergies  Allergen Reactions   Cymbalta [Duloxetine Hcl] Itching    Nasal itching   Methocarbamol    Propoxyphene N-Acetaminophen Itching   Sulfa Antibiotics     HOME MEDICATIONS: Current Outpatient Medications  Medication Sig Dispense Refill   Accu-Chek Softclix Lancets lancets 1 each by Other route daily. as directed 100 each 3   AIMOVIG 140 MG/ML  SOAJ Inject 140 mg into the skin daily.     amLODipine (NORVASC) 10 MG tablet Take 1 tablet (10 mg total) by mouth daily. 90 tablet 1   aspirin EC 81 MG tablet Take 1 tablet by mouth daily.     atorvastatin (LIPITOR) 20 MG tablet TAKE 1 TABLET EVERY DAY 90 tablet 10   cetirizine (ZYRTEC) 10 MG tablet TAKE 1 TABLET EVERY DAY 90 tablet 0   fluticasone (FLONASE) 50 MCG/ACT nasal spray Place 1 spray into both nostrils daily. 16 g 2   gabapentin (NEURONTIN) 300 MG capsule Take 600 mg by mouth at bedtime.     glucose blood (ACCU-CHEK AVIVA PLUS) test strip 1 each by Other route daily. Use as instructed 100 strip 3   naproxen sodium (ALEVE) 220 MG tablet Take 440 mg by mouth daily as needed (for pain).     Omega-3 Fatty Acids (FISH OIL) 1000 MG CAPS Take 2 capsules by mouth daily.     Oxcarbazepine (TRILEPTAL) 300 MG tablet Take 300 mg by mouth 2 (two) times daily.     polyethylene glycol powder (GLYCOLAX/MIRALAX) 17 GM/SCOOP powder Take 17 g by mouth 2 (two) times daily as needed. 3350 g 1   propranolol (INDERAL) 60 MG tablet Take 1 tablet (60 mg total) by mouth every morning. (Patient taking differently: Take 80 mg by mouth every morning.) 90 tablet 3   Semaglutide, 1 MG/DOSE, (OZEMPIC, 1 MG/DOSE,) 4 MG/3ML SOPN INJECT '1MG'$  UNDER THE SKIN ONE TIME WEEKLY 9 mL 10   UNABLE TO FIND Aleve pain relieving lotion-daily     VITAMIN D PO Take 10,000 Units by mouth daily.     No current facility-administered medications for this visit.    PAST MEDICAL HISTORY: Past Medical History:  Diagnosis Date   Allergy    Anxiety    Arnold-Chiari malformation (HCC)    Arthritis    Borderline diabetic    Chronic headaches    Colon polyps    nonadenomatous   Depression    Diabetes mellitus without complication ([place])    Diverticulosis    Esophageal stricture    FROZEN LEFT SHOULDER 04/01/2010   Qualifier: Diagnosis of  By: Aline Brochure MD, Dorothyann Peng     GERD (gastroesophageal reflux disease)    HTN (hypertension)     Hyperlipidemia    IBS (irritable bowel syndrome)    Seizures ([place])    pt not sure when her last seizure was- she says she just spaces out- no shaking but she could not tell me last seizure-  on Oxtellar bid for seizures    Sleep apnea    no cpap now- did use 02 with cpap but has not used either in "years"    PAST SURGICAL HISTORY: Past Surgical History:  Procedure Laterality Date   BREAST BIOPSY Left    cahri decompression  6 and 03/1999   CHOLECYSTECTOMY     COLONOSCOPY     invasive cervical traction  02/2009   left breast-lumpectomy     POLYPECTOMY     shunt put in  on right side of brain     SHUNT REPLACEMENT  12/10   TOTAL ABDOMINAL HYSTERECTOMY  Age 37 yrs   TAH & BSO   TUBAL LIGATION     UPPER GASTROINTESTINAL ENDOSCOPY      FAMILY HISTORY: Family History  Problem Relation Age of Onset   Colon polyps Mother    Breast cancer Mother    Lung cancer Mother    Heart disease Father    Stroke Father    Aortic aneurysm Father    Chiari malformation Daughter    Addison's disease Son    Breast cancer Maternal Aunt    Colon cancer Neg Hx    Esophageal cancer Neg Hx    Rectal cancer Neg Hx    Stomach cancer Neg Hx     SOCIAL HISTORY: Social History   Socioeconomic History   Marital status: Married    Spouse name: Not on file   Number of children: 3   Years of education: Not on file   Highest education level: Not on file  Occupational History   Not on file  Tobacco Use   Smoking status: Former    Packs/day: 1.50    Years: 30.00    Total pack years: 45.00    Types: Cigarettes    Quit date: 09/13/1990    Years since quitting: 32.0    Passive exposure: Never   Smokeless tobacco: Never  Vaping Use   Vaping Use: Never used  Substance and Sexual Activity   Alcohol use: No   Drug use: No   Sexual activity: Not Currently    Birth control/protection: Surgical    Comment: hyst  Other Topics Concern   Not on file  Social History Narrative   Disabled from Chiari  malformation/seizures.. Drinks 4-5 cups of tea daily. Does not get regular exercise. Married for 40 years.   Social Determinants of Health   Financial Resource Strain: Low Risk  (08/23/2022)   Overall Financial Resource Strain (CARDIA)    Difficulty of Paying Living Expenses: Not very hard  Food Insecurity: No Food Insecurity (07/09/2020)   Hunger Vital Sign    Worried About Running Out of Food in the Last Year: Never true    Ran Out of Food in the Last Year: Never true  Transportation Needs: No Transportation Needs (08/23/2022)   PRAPARE - Hydrologist (Medical): No    Lack of Transportation (Non-Medical): No  Physical Activity: Sufficiently Active (07/09/2020)   Exercise Vital Sign    Days of Exercise per Week: 7 days    Minutes of Exercise per Session: 30 min  Stress: No Stress Concern Present (07/09/2020)   [place]    Feeling of Stress : Not at all  Social Connections: Moderately Isolated (07/09/2020)   Social Connection and Isolation Panel [NHANES]    Frequency of Communication with Friends and Family: More than three times a week    Frequency of Social Gatherings with Friends and Family: Once a week    Attends Religious Services: Never    Marine scientist or Organizations: No    Attends Archivist Meetings: Never    Marital Status: Married  Human resources officer Violence: Not At Risk (07/09/2020)   Humiliation, Afraid, Rape, and Kick questionnaire    Fear of Current or Ex-Partner: No    Emotionally Abused: No    Physically Abused: No    Sexually Abused: No  Marcial Pacas, M.D. Ph.D.  [facility] [address] [place], [place] [address] Ph: [phone] Fax: [phone]  CC:  Amanda Spar, MD [address] [place],  [place] [address]  Amanda Spar, MD

## 2022-09-23 ENCOUNTER — Telehealth: Payer: Self-pay | Admitting: Neurology

## 2022-09-23 NOTE — Telephone Encounter (Signed)
Craig Staggers: [id number] exp. 09/23/22-10/23/22 sent to [facility] [phone]

## 2022-10-04 ENCOUNTER — Encounter: Payer: Self-pay | Admitting: Internal Medicine

## 2022-10-04 NOTE — Telephone Encounter (Signed)
Patient scheduled Tuesday in office

## 2022-10-04 NOTE — Telephone Encounter (Signed)
Called patient left voicemail to call office to schedule an appointment and sent a mychart message

## 2022-10-05 ENCOUNTER — Ambulatory Visit: Payer: Medicare PPO | Admitting: Internal Medicine

## 2022-10-05 ENCOUNTER — Ambulatory Visit: Payer: Medicare PPO | Admitting: Neurology

## 2022-10-05 ENCOUNTER — Encounter: Payer: Self-pay | Admitting: Internal Medicine

## 2022-10-05 VITALS — BP 120/78 | HR 83 | Ht 62.0 in | Wt 182.2 lb

## 2022-10-05 DIAGNOSIS — E1142 Type 2 diabetes mellitus with diabetic polyneuropathy: Secondary | ICD-10-CM

## 2022-10-05 DIAGNOSIS — K5904 Chronic idiopathic constipation: Secondary | ICD-10-CM

## 2022-10-05 DIAGNOSIS — K219 Gastro-esophageal reflux disease without esophagitis: Secondary | ICD-10-CM

## 2022-10-05 DIAGNOSIS — R269 Unspecified abnormalities of gait and mobility: Secondary | ICD-10-CM

## 2022-10-05 DIAGNOSIS — N390 Urinary tract infection, site not specified: Secondary | ICD-10-CM

## 2022-10-05 DIAGNOSIS — G43709 Chronic migraine without aura, not intractable, without status migrainosus: Secondary | ICD-10-CM

## 2022-10-05 DIAGNOSIS — Z982 Presence of cerebrospinal fluid drainage device: Secondary | ICD-10-CM

## 2022-10-05 DIAGNOSIS — G40909 Epilepsy, unspecified, not intractable, without status epilepticus: Secondary | ICD-10-CM | POA: Diagnosis not present

## 2022-10-05 LAB — POCT URINALYSIS DIP (CLINITEK)
Bilirubin, UA: NEGATIVE
Blood, UA: NEGATIVE
Glucose, UA: 500 mg/dL — AB
Ketones, POC UA: NEGATIVE mg/dL
Leukocytes, UA: NEGATIVE
Nitrite, UA: NEGATIVE
POC PROTEIN,UA: NEGATIVE
Spec Grav, UA: 1.02 (ref 1.010–1.025)
Urobilinogen, UA: 1 E.U./dL
pH, UA: 5.5 (ref 5.0–8.0)

## 2022-10-05 MED ORDER — NITROFURANTOIN MONOHYD MACRO 100 MG PO CAPS: 100.0000 mg | ORAL_CAPSULE | Freq: Two times a day (BID) | ORAL | 0 refills | Status: DC

## 2022-10-05 MED ORDER — FAMOTIDINE 20 MG PO TABS: 20.0000 mg | ORAL_TABLET | Freq: Every day | ORAL | 3 refills | Status: DC

## 2022-10-05 MED ORDER — SENNA-DOCUSATE SODIUM 8.6-50 MG PO TABS: 1.0000 | ORAL_TABLET | Freq: Every day | ORAL | 2 refills | Status: AC

## 2022-10-05 NOTE — Patient Instructions (Addendum)
Please start taking Macrobid as prescribed.  Please avoid oily and fried food. Please take Senokot-S for constipation. Please take GasX for bloating.

## 2022-10-07 DIAGNOSIS — K219 Gastro-esophageal reflux disease without esophagitis: Secondary | ICD-10-CM | POA: Insufficient documentation

## 2022-10-07 DIAGNOSIS — N390 Urinary tract infection, site not specified: Secondary | ICD-10-CM | POA: Insufficient documentation

## 2022-10-07 NOTE — Assessment & Plan Note (Signed)
Nausea and epigastric pain could be due to Ozempic Needs to follow small, frequent meals Zofran as needed for nausea Added Pepcid for acid reflux

## 2022-10-07 NOTE — Assessment & Plan Note (Signed)
UA reviewed - but considering her symptoms, started empiric Macrobid Check urine culture Advised to maintain adequate hydration

## 2022-10-07 NOTE — Progress Notes (Signed)
Acute Office Visit  Subjective:    Patient ID: Amanda Webster, female    DOB: Feb 27, 1961, 62 y.o.   MRN: [id number]  Chief Complaint  Patient presents with   Urinary Tract Infection    Patient states she is having burning with urination , pain in pelvic region and side that radiates to her back.     HPI Patient is in today for complaint of dysuria and flank pain for the last 1 week.  She has tried taking Azo without much relief.  Denies any hematuria.  Denies any fever or chills recently.  She also reports epigastric and left upper quadrant pain, which is worse with eating.  She also reports chronic constipation and straining while passing stool.  Denies any melena or hematochezia currently.  Past Medical History:  Diagnosis Date   Allergy    Anxiety    Arnold-Chiari malformation (HCC)    Arthritis    Borderline diabetic    Chronic headaches    Colon polyps    nonadenomatous   Depression    Diabetes mellitus without complication ([place])    Diverticulosis    Esophageal stricture    FROZEN LEFT SHOULDER 04/01/2010   Qualifier: Diagnosis of  By: Aline Brochure MD, Dorothyann Peng     GERD (gastroesophageal reflux disease)    HTN (hypertension)    Hyperlipidemia    IBS (irritable bowel syndrome)    Seizures ([place])    pt not sure when her last seizure was- she says she just spaces out- no shaking but she could not tell me last seizure-  on Oxtellar bid for seizures    Sleep apnea    no cpap now- did use 02 with cpap but has not used either in "years"    Past Surgical History:  Procedure Laterality Date   BREAST BIOPSY Left    cahri decompression  6 and 03/1999   CHOLECYSTECTOMY     COLONOSCOPY     invasive cervical traction  02/2009   left breast-lumpectomy     POLYPECTOMY     shunt put in on right side of brain     SHUNT REPLACEMENT  12/10   TOTAL ABDOMINAL HYSTERECTOMY  Age 64 yrs   TAH & BSO   TUBAL LIGATION     UPPER GASTROINTESTINAL ENDOSCOPY      Family History  Problem  Relation Age of Onset   Colon polyps Mother    Breast cancer Mother    Lung cancer Mother    Heart disease Father    Stroke Father    Aortic aneurysm Father    Chiari malformation Daughter    Addison's disease Son    Breast cancer Maternal Aunt    Colon cancer Neg Hx    Esophageal cancer Neg Hx    Rectal cancer Neg Hx    Stomach cancer Neg Hx     Social History   Socioeconomic History   Marital status: Married    Spouse name: Not on file   Number of children: 3   Years of education: Not on file   Highest education level: Not on file  Occupational History   Not on file  Tobacco Use   Smoking status: Former    Packs/day: 1.50    Years: 30.00    Total pack years: 45.00    Types: Cigarettes    Quit date: 09/13/1990    Years since quitting: 32.0    Passive exposure: Never   Smokeless tobacco: Never  Vaping Use  Vaping Use: Never used  Substance and Sexual Activity   Alcohol use: No   Drug use: No   Sexual activity: Not Currently    Birth control/protection: Surgical    Comment: hyst  Other Topics Concern   Not on file  Social History Narrative   Disabled from Chiari malformation/seizures.. Drinks 4-5 cups of tea daily. Does not get regular exercise. Married for 40 years.   Social Determinants of Health   Financial Resource Strain: Low Risk  (08/23/2022)   Overall Financial Resource Strain (CARDIA)    Difficulty of Paying Living Expenses: Not very hard  Food Insecurity: No Food Insecurity (07/09/2020)   Hunger Vital Sign    Worried About Running Out of Food in the Last Year: Never true    Ran Out of Food in the Last Year: Never true  Transportation Needs: No Transportation Needs (08/23/2022)   PRAPARE - Hydrologist (Medical): No    Lack of Transportation (Non-Medical): No  Physical Activity: Sufficiently Active (07/09/2020)   Exercise Vital Sign    Days of Exercise per Week: 7 days    Minutes of Exercise per Session: 30 min   Stress: No Stress Concern Present (07/09/2020)   [place]    Feeling of Stress : Not at all  Social Connections: Moderately Isolated (07/09/2020)   Social Connection and Isolation Panel [NHANES]    Frequency of Communication with Friends and Family: More than three times a week    Frequency of Social Gatherings with Friends and Family: Once a week    Attends Religious Services: Never    Marine scientist or Organizations: No    Attends Archivist Meetings: Never    Marital Status: Married  Human resources officer Violence: Not At Risk (07/09/2020)   Humiliation, Afraid, Rape, and Kick questionnaire    Fear of Current or Ex-Partner: No    Emotionally Abused: No    Physically Abused: No    Sexually Abused: No    Outpatient Medications Prior to Visit  Medication Sig Dispense Refill   ondansetron (ZOFRAN-ODT) 4 MG disintegrating tablet Take by mouth.     famotidine (PEPCID) 20 MG tablet Take by mouth.     Accu-Chek Softclix Lancets lancets 1 each by Other route daily. as directed 100 each 3   AIMOVIG 140 MG/ML SOAJ Inject 140 mg into the skin daily.     amLODipine (NORVASC) 10 MG tablet Take 1 tablet (10 mg total) by mouth daily. 90 tablet 1   aspirin EC 81 MG tablet Take 1 tablet by mouth daily.     atorvastatin (LIPITOR) 20 MG tablet TAKE 1 TABLET EVERY DAY 90 tablet 10   cetirizine (ZYRTEC) 10 MG tablet TAKE 1 TABLET EVERY DAY 90 tablet 0   fluticasone (FLONASE) 50 MCG/ACT nasal spray Place 1 spray into both nostrils daily. 16 g 2   gabapentin (NEURONTIN) 300 MG capsule Take 600 mg by mouth at bedtime.     glucose blood (ACCU-CHEK AVIVA PLUS) test strip 1 each by Other route daily. Use as instructed 100 strip 3   naproxen sodium (ALEVE) 220 MG tablet Take 440 mg by mouth daily as needed (for pain).     Omega-3 Fatty Acids (FISH OIL) 1000 MG CAPS Take 2 capsules by mouth daily.     Oxcarbazepine (TRILEPTAL)  300 MG tablet Take 300 mg by mouth 2 (two) times daily.     polyethylene glycol  powder (GLYCOLAX/MIRALAX) 17 GM/SCOOP powder Take 17 g by mouth 2 (two) times daily as needed. 3350 g 1   propranolol (INDERAL) 60 MG tablet Take 1 tablet (60 mg total) by mouth every morning. (Patient taking differently: Take 80 mg by mouth every morning.) 90 tablet 3   Semaglutide, 1 MG/DOSE, (OZEMPIC, 1 MG/DOSE,) 4 MG/3ML SOPN INJECT '1MG'$  UNDER THE SKIN ONE TIME WEEKLY 9 mL 10   UNABLE TO FIND Aleve pain relieving lotion-daily     VITAMIN D PO Take 10,000 Units by mouth daily.     No facility-administered medications prior to visit.    Allergies  Allergen Reactions   Cymbalta [Duloxetine Hcl] Itching    Nasal itching   Methocarbamol    Propoxyphene N-Acetaminophen Itching   Sulfa Antibiotics     Review of Systems  Constitutional:  Negative for chills and fever.  HENT:  Negative for congestion, sinus pressure and sinus pain.   Eyes:  Negative for pain and discharge.  Respiratory:  Negative for cough and shortness of breath.   Cardiovascular:  Negative for chest pain and palpitations.  Gastrointestinal:  Positive for abdominal pain and constipation. Negative for diarrhea, nausea and vomiting.  Genitourinary:  Positive for dysuria, frequency and pelvic pain. Negative for hematuria.  Musculoskeletal:  Positive for arthralgias, back pain and gait problem. Negative for neck pain and neck stiffness.  Skin:  Negative for rash.  Neurological:  Positive for headaches. Negative for weakness.  Psychiatric/Behavioral:  Negative for agitation.        Objective:    Physical Exam Vitals reviewed.  Constitutional:      General: She is not in acute distress.    Appearance: She is obese. She is not diaphoretic.  HENT:     Head: Normocephalic and atraumatic.     Nose: Nose normal.     Mouth/Throat:     Mouth: Mucous membranes are moist.  Eyes:     General: No scleral icterus.    Extraocular Movements:  Extraocular movements intact.  Cardiovascular:     Rate and Rhythm: Normal rate and regular rhythm.     Pulses: Normal pulses.     Heart sounds: Normal heart sounds. No murmur heard. Pulmonary:     Breath sounds: Normal breath sounds. No wheezing or rales.  Abdominal:     Palpations: Abdomen is soft.     Tenderness: There is abdominal tenderness (Epigastric and suprapubic). There is no right CVA tenderness or left CVA tenderness.  Musculoskeletal:     Cervical back: Neck supple. No tenderness.     Right lower leg: No edema.     Left lower leg: No edema.  Skin:    General: Skin is warm.     Findings: No rash.  Neurological:     General: No focal deficit present.     Mental Status: She is alert and oriented to person, place, and time.     Sensory: No sensory deficit.     Motor: No weakness.  Psychiatric:        Mood and Affect: Mood normal.        Behavior: Behavior normal.     BP 120/78 (BP Location: Left Arm, Patient Position: Sitting, Cuff Size: Large)   Pulse 83   Ht '5\' 2"'$  (1.575 m)   Wt 182 lb 3.2 oz (82.6 kg)   SpO2 94%   BMI 33.32 kg/m  Wt Readings from Last 3 Encounters:  10/05/22 182 lb 3.2 oz (82.6 kg)  09/21/22 184  lb (83.5 kg)  09/07/22 187 lb (84.8 kg)        Assessment & Plan:   Problem List Items Addressed This Visit       Digestive   Chronic idiopathic constipation    Some of her symptoms are overlapping with chronic constipation Could be due to multiple meds Needs to improve hydration Added Senokot-S MiraLAX as needed for persistent constipation Advised to take cranberry juice as needed      Relevant Medications   sennosides-docusate sodium (SENOKOT-S) 8.6-50 MG tablet   Gastroesophageal reflux disease    Nausea and epigastric pain could be due to Ozempic Needs to follow small, frequent meals Zofran as needed for nausea Added Pepcid for acid reflux      Relevant Medications   ondansetron (ZOFRAN-ODT) 4 MG disintegrating tablet    sennosides-docusate sodium (SENOKOT-S) 8.6-50 MG tablet   famotidine (PEPCID) 20 MG tablet     Genitourinary   Urinary tract infection without hematuria - Primary    UA reviewed - but considering her symptoms, started empiric Macrobid Check urine culture Advised to maintain adequate hydration      Relevant Medications   nitrofurantoin, macrocrystal-monohydrate, (MACROBID) 100 MG capsule   Other Relevant Orders   POCT URINALYSIS DIP (CLINITEK) (Completed)   Urine Culture     Meds ordered this encounter  Medications   nitrofurantoin, macrocrystal-monohydrate, (MACROBID) 100 MG capsule    Sig: Take 1 capsule (100 mg total) by mouth 2 (two) times daily.    Dispense:  10 capsule    Refill:  0   sennosides-docusate sodium (SENOKOT-S) 8.6-50 MG tablet    Sig: Take 1 tablet by mouth daily.    Dispense:  30 tablet    Refill:  2   famotidine (PEPCID) 20 MG tablet    Sig: Take 1 tablet (20 mg total) by mouth daily.    Dispense:  30 tablet    Refill:  3     Yemariam Magar Keith Rake, MD

## 2022-10-07 NOTE — Assessment & Plan Note (Signed)
Some of her symptoms are overlapping with chronic constipation Could be due to multiple meds Needs to improve hydration Added Senokot-S MiraLAX as needed for persistent constipation Advised to take cranberry juice as needed

## 2022-10-10 LAB — URINE CULTURE

## 2022-10-13 NOTE — Procedures (Signed)
   HISTORY: 62 year old female with history of seizure  TECHNIQUE:  This is a routine 16 channel EEG recording with one channel devoted to a limited EKG recording.  It was performed during wakefulness, drowsiness and asleep.  Hyperventilation and photic stimulation were performed as activating procedures.  There are minimum muscle and movement artifact noted.  Upon maximum arousal, posterior dominant waking rhythm consistent of rhythmic alpha range activity. Activities are symmetric over the bilateral posterior derivations and attenuated with eye opening.  Hyperventilation produced mild/moderate buildup with higher amplitude and the slower activities noted.  Photic stimulation did not alter the tracing.  During EEG recording, patient developed drowsiness and entered sleep, sleep EEG demonstrated architecture, there were frontal centrally dominant vertex waves and symmetric sleep spindles noted.  During EEG recording, there was no epileptiform discharge noted.  EKG demonstrate normal sinus rhythm.  CONCLUSION: This is a  normal awake and asleep EEG.  There is no electrodiagnostic evidence of epileptiform discharge.  Marcial Pacas, M.D. Ph.D.  [facility] [place], [place] [address] Phone: [phone] Fax:      [phone]

## 2022-10-20 ENCOUNTER — Ambulatory Visit (HOSPITAL_COMMUNITY)
Admission: RE | Admit: 2022-10-20 | Discharge: 2022-10-20 | Disposition: A | Discharge status: Home / Self Care | Payer: Medicare PPO | Source: Ambulatory Visit | Attending: Neurology | Admitting: Neurology

## 2022-10-20 DIAGNOSIS — Z982 Presence of cerebrospinal fluid drainage device: Secondary | ICD-10-CM | POA: Diagnosis not present

## 2022-10-20 DIAGNOSIS — E1142 Type 2 diabetes mellitus with diabetic polyneuropathy: Secondary | ICD-10-CM | POA: Insufficient documentation

## 2022-10-20 DIAGNOSIS — M47812 Spondylosis without myelopathy or radiculopathy, cervical region: Secondary | ICD-10-CM | POA: Diagnosis not present

## 2022-10-20 DIAGNOSIS — M4802 Spinal stenosis, cervical region: Secondary | ICD-10-CM | POA: Diagnosis not present

## 2022-10-20 DIAGNOSIS — D1800 Hemangioma unspecified site: Secondary | ICD-10-CM | POA: Diagnosis not present

## 2022-10-20 DIAGNOSIS — M5021 Other cervical disc displacement,  high cervical region: Secondary | ICD-10-CM | POA: Diagnosis not present

## 2022-10-21 DIAGNOSIS — Z982 Presence of cerebrospinal fluid drainage device: Secondary | ICD-10-CM | POA: Diagnosis not present

## 2022-10-21 DIAGNOSIS — Z4541 Encounter for adjustment and management of cerebrospinal fluid drainage device: Secondary | ICD-10-CM | POA: Diagnosis not present

## 2022-10-25 DIAGNOSIS — G918 Other hydrocephalus: Secondary | ICD-10-CM | POA: Diagnosis not present

## 2022-10-25 DIAGNOSIS — Z982 Presence of cerebrospinal fluid drainage device: Secondary | ICD-10-CM | POA: Diagnosis not present

## 2022-10-25 DIAGNOSIS — G919 Hydrocephalus, unspecified: Secondary | ICD-10-CM | POA: Diagnosis not present

## 2022-10-26 ENCOUNTER — Telehealth: Payer: Self-pay | Admitting: Internal Medicine

## 2022-10-26 NOTE — Telephone Encounter (Signed)
Spoke to patient

## 2022-10-26 NOTE — Telephone Encounter (Signed)
Patient called need speak to provider about Semaglutide, 1 MG/DOSE, (OZEMPIC, 1 MG/DOSE,) 4 MG/3ML SOPN [id number]  Does patient need to do this this week or wait, patient still struggling to eat still and having headaches. Please return Call back # [phone].

## 2022-11-11 ENCOUNTER — Ambulatory Visit: Payer: Medicare PPO | Admitting: Internal Medicine

## 2022-11-11 ENCOUNTER — Encounter: Payer: Self-pay | Admitting: Internal Medicine

## 2022-11-11 VITALS — BP 131/74 | HR 52 | Ht 62.0 in | Wt 184.8 lb

## 2022-11-11 DIAGNOSIS — G919 Hydrocephalus, unspecified: Secondary | ICD-10-CM | POA: Diagnosis not present

## 2022-11-11 DIAGNOSIS — G40109 Localization-related (focal) (partial) symptomatic epilepsy and epileptic syndromes with simple partial seizures, not intractable, without status epilepticus: Secondary | ICD-10-CM | POA: Diagnosis not present

## 2022-11-11 DIAGNOSIS — I10 Essential (primary) hypertension: Secondary | ICD-10-CM | POA: Diagnosis not present

## 2022-11-11 DIAGNOSIS — E1142 Type 2 diabetes mellitus with diabetic polyneuropathy: Secondary | ICD-10-CM

## 2022-11-11 DIAGNOSIS — G43719 Chronic migraine without aura, intractable, without status migrainosus: Secondary | ICD-10-CM | POA: Diagnosis not present

## 2022-11-11 NOTE — Assessment & Plan Note (Signed)
On Aimovig and propranolol for ppx Followed by neurology - Dr. Krista Blue

## 2022-11-11 NOTE — Progress Notes (Signed)
Established Patient Office Visit  Subjective:  Patient ID: Amanda Webster, female    DOB: [birth year]  Age: 62 y.o. MRN: [id number]  CC:  Chief Complaint  Patient presents with   Hypertension    Three month follow up. Patient would like to discuss medication changes and increased bladder.    HPI Amanda Webster is a 62 y.o. female with past medical history of HTN, type II DM with HLD, polyarthritis, migraine, seizure disorder, allergic rhinitis and morbid obesity who presents for f/u of her chronic medical conditions.  She had been having abdominal pain, nausea and vomiting in the last week, and was advised to hold Ozempic.  She reports that her abdominal pain has improved slowly.  She still has decreased appetite.  Denies any fever or chills.  She has had headache and gait imbalance.  She had recent reprogramming of her VP shunt due to enlarged ventricles on CT head.  She also reports increasing urine volume since stopping Ozempic, which is likely due to hyperglycemia.  Denies any dysuria or hematuria.  HTN: BP is well-controlled. Takes medications regularly. Patient denies headache, chest pain, dyspnea or palpitations.   Type II DM with HLD: Her HbA1C was 5.9 in 09/23.  She takes Ozempic 1 mg every week, but had to stop recently due to acute GI symptoms.  Her blood glucose had been around 150 and less most of the time, but have been above 200 recently. She denies any polyuria or polydipsia. She takes Lipitor as well.     Past Medical History:  Diagnosis Date   Allergy    Anxiety    Arnold-Chiari malformation (HCC)    Arthritis    Borderline diabetic    Chronic headaches    Colon polyps    nonadenomatous   Depression    Diabetes mellitus without complication ([place])    Diverticulosis    Esophageal stricture    FROZEN LEFT SHOULDER 04/01/2010   Qualifier: Diagnosis of  By: Aline Brochure MD, Dorothyann Peng     GERD (gastroesophageal reflux disease)    HTN (hypertension)     Hyperlipidemia    IBS (irritable bowel syndrome)    Seizures ([place])    pt not sure when her last seizure was- she says she just spaces out- no shaking but she could not tell me last seizure-  on Oxtellar bid for seizures    Sleep apnea    no cpap now- did use 02 with cpap but has not used either in "years"    Past Surgical History:  Procedure Laterality Date   BREAST BIOPSY Left    cahri decompression  8 and 03/1999   CHOLECYSTECTOMY     COLONOSCOPY     invasive cervical traction  02/2009   left breast-lumpectomy     POLYPECTOMY     shunt put in on right side of brain     SHUNT REPLACEMENT  12/10   TOTAL ABDOMINAL HYSTERECTOMY  Age 69 yrs   TAH & BSO   TUBAL LIGATION     UPPER GASTROINTESTINAL ENDOSCOPY      Family History  Problem Relation Age of Onset   Colon polyps Mother    Breast cancer Mother    Lung cancer Mother    Heart disease Father    Stroke Father    Aortic aneurysm Father    Chiari malformation Daughter    Addison's disease Son    Breast cancer Maternal Aunt    Colon cancer Neg Hx  Esophageal cancer Neg Hx    Rectal cancer Neg Hx    Stomach cancer Neg Hx     Social History   Socioeconomic History   Marital status: Married    Spouse name: Not on file   Number of children: 3   Years of education: Not on file   Highest education level: Not on file  Occupational History   Not on file  Tobacco Use   Smoking status: Former    Packs/day: 1.50    Years: 30.00    Total pack years: 45.00    Types: Cigarettes    Quit date: 09/13/1990    Years since quitting: 32.1    Passive exposure: Never   Smokeless tobacco: Never  Vaping Use   Vaping Use: Never used  Substance and Sexual Activity   Alcohol use: No   Drug use: No   Sexual activity: Not Currently    Birth control/protection: Surgical    Comment: hyst  Other Topics Concern   Not on file  Social History Narrative   Disabled from Chiari malformation/seizures.. Drinks 4-5 cups of tea daily. Does  not get regular exercise. Married for 40 years.   Social Determinants of Health   Financial Resource Strain: Low Risk  (08/23/2022)   Overall Financial Resource Strain (CARDIA)    Difficulty of Paying Living Expenses: Not very hard  Food Insecurity: No Food Insecurity (07/09/2020)   Hunger Vital Sign    Worried About Running Out of Food in the Last Year: Never true    Ran Out of Food in the Last Year: Never true  Transportation Needs: No Transportation Needs (08/23/2022)   PRAPARE - Hydrologist (Medical): No    Lack of Transportation (Non-Medical): No  Physical Activity: Sufficiently Active (07/09/2020)   Exercise Vital Sign    Days of Exercise per Week: 7 days    Minutes of Exercise per Session: 30 min  Stress: No Stress Concern Present (07/09/2020)   [place]    Feeling of Stress : Not at all  Social Connections: Moderately Isolated (07/09/2020)   Social Connection and Isolation Panel [NHANES]    Frequency of Communication with Friends and Family: More than three times a week    Frequency of Social Gatherings with Friends and Family: Once a week    Attends Religious Services: Never    Marine scientist or Organizations: No    Attends Archivist Meetings: Never    Marital Status: Married  Human resources officer Violence: Not At Risk (07/09/2020)   Humiliation, Afraid, Rape, and Kick questionnaire    Fear of Current or Ex-Partner: No    Emotionally Abused: No    Physically Abused: No    Sexually Abused: No    Outpatient Medications Prior to Visit  Medication Sig Dispense Refill   Accu-Chek Softclix Lancets lancets 1 each by Other route daily. as directed 100 each 3   AIMOVIG 140 MG/ML SOAJ Inject 140 mg into the skin daily.     amLODipine (NORVASC) 10 MG tablet Take 1 tablet (10 mg total) by mouth daily. 90 tablet 1   aspirin EC 81 MG tablet Take 1 tablet by mouth  daily.     atorvastatin (LIPITOR) 20 MG tablet TAKE 1 TABLET EVERY DAY 90 tablet 10   cetirizine (ZYRTEC) 10 MG tablet TAKE 1 TABLET EVERY DAY 90 tablet 0   famotidine (PEPCID) 20 MG tablet Take 1 tablet (  20 mg total) by mouth daily. 30 tablet 3   fluticasone (FLONASE) 50 MCG/ACT nasal spray Place 1 spray into both nostrils daily. 16 g 2   gabapentin (NEURONTIN) 300 MG capsule Take 600 mg by mouth at bedtime.     glucose blood (ACCU-CHEK AVIVA PLUS) test strip 1 each by Other route daily. Use as instructed 100 strip 3   naproxen sodium (ALEVE) 220 MG tablet Take 440 mg by mouth daily as needed (for pain).     Omega-3 Fatty Acids (FISH OIL) 1000 MG CAPS Take 2 capsules by mouth daily.     ondansetron (ZOFRAN-ODT) 4 MG disintegrating tablet Take by mouth.     Oxcarbazepine (TRILEPTAL) 300 MG tablet Take 300 mg by mouth 2 (two) times daily.     polyethylene glycol powder (GLYCOLAX/MIRALAX) 17 GM/SCOOP powder Take 17 g by mouth 2 (two) times daily as needed. 3350 g 1   propranolol (INDERAL) 60 MG tablet Take 1 tablet (60 mg total) by mouth every morning. (Patient taking differently: Take 80 mg by mouth every morning.) 90 tablet 3   Semaglutide, 1 MG/DOSE, (OZEMPIC, 1 MG/DOSE,) 4 MG/3ML SOPN INJECT '1MG'$  UNDER THE SKIN ONE TIME WEEKLY 9 mL 10   sennosides-docusate sodium (SENOKOT-S) 8.6-50 MG tablet Take 1 tablet by mouth daily. 30 tablet 2   UNABLE TO FIND Aleve pain relieving lotion-daily     VITAMIN D PO Take 10,000 Units by mouth daily.     nitrofurantoin, macrocrystal-monohydrate, (MACROBID) 100 MG capsule Take 1 capsule (100 mg total) by mouth 2 (two) times daily. 10 capsule 0   No facility-administered medications prior to visit.    Allergies  Allergen Reactions   Cymbalta [Duloxetine Hcl] Itching    Nasal itching   Methocarbamol    Propoxyphene N-Acetaminophen Itching   Sulfa Antibiotics     ROS Review of Systems  Constitutional:  Negative for chills and fever.  HENT:  Negative for  congestion, sinus pressure and sinus pain.   Eyes:  Negative for pain and discharge.  Respiratory:  Negative for cough and shortness of breath.   Cardiovascular:  Negative for chest pain and palpitations.  Gastrointestinal:  Positive for abdominal pain and constipation. Negative for diarrhea, nausea and vomiting.  Genitourinary:  Negative for frequency and hematuria.  Musculoskeletal:  Positive for arthralgias, back pain and gait problem. Negative for neck pain and neck stiffness.  Skin:  Negative for rash.  Neurological:  Positive for headaches. Negative for weakness.  Psychiatric/Behavioral:  Negative for agitation.       Objective:    Physical Exam Vitals reviewed.  Constitutional:      General: She is not in acute distress.    Appearance: She is obese. She is not diaphoretic.  HENT:     Head: Normocephalic and atraumatic.     Nose: Nose normal.     Mouth/Throat:     Mouth: Mucous membranes are moist.  Eyes:     General: No scleral icterus.    Extraocular Movements: Extraocular movements intact.  Cardiovascular:     Rate and Rhythm: Normal rate and regular rhythm.     Pulses: Normal pulses.     Heart sounds: Normal heart sounds. No murmur heard. Pulmonary:     Breath sounds: Normal breath sounds. No wheezing or rales.  Abdominal:     Palpations: Abdomen is soft.     Tenderness: There is no abdominal tenderness. There is no right CVA tenderness or left CVA tenderness.  Musculoskeletal:  Cervical back: Neck supple. No tenderness.     Right lower leg: No edema.     Left lower leg: No edema.  Skin:    General: Skin is warm.     Findings: No rash.  Neurological:     General: No focal deficit present.     Mental Status: She is alert and oriented to person, place, and time.     Sensory: No sensory deficit.     Motor: No weakness.  Psychiatric:        Mood and Affect: Mood normal.        Behavior: Behavior normal.     BP 131/74 (BP Location: Right Arm, Patient  Position: Sitting, Cuff Size: Normal)   Pulse (!) 52   Ht '5\' 2"'$  (1.575 m)   Wt 184 lb 12.8 oz (83.8 kg)   SpO2 95%   BMI 33.80 kg/m  Wt Readings from Last 3 Encounters:  11/11/22 184 lb 12.8 oz (83.8 kg)  10/05/22 182 lb 3.2 oz (82.6 kg)  09/21/22 184 lb (83.5 kg)    Lab Results  Component Value Date   TSH 0.684 02/03/2022   Lab Results  Component Value Date   WBC 7.0 02/03/2022   HGB 15.1 02/03/2022   HCT 40.8 02/03/2022   MCV 89 02/03/2022   PLT 209 02/03/2022   Lab Results  Component Value Date   NA 139 06/25/2022   K 3.9 06/25/2022   CO2 23 06/25/2022   GLUCOSE 133 (H) 06/25/2022   BUN 10 06/25/2022   CREATININE 0.87 06/25/2022   BILITOT 0.3 02/03/2022   ALKPHOS 162 (H) 02/03/2022   AST 17 02/03/2022   ALT 17 02/03/2022   PROT 6.6 02/03/2022   ALBUMIN 4.4 02/03/2022   CALCIUM 9.7 06/25/2022   ANIONGAP 8 03/25/2021   EGFR 77 06/25/2022   Lab Results  Component Value Date   CHOL 152 02/03/2022   Lab Results  Component Value Date   HDL 47 02/03/2022   Lab Results  Component Value Date   LDLCALC 68 02/03/2022   Lab Results  Component Value Date   TRIG 228 (H) 02/03/2022   Lab Results  Component Value Date   CHOLHDL 3.2 02/03/2022   Lab Results  Component Value Date   HGBA1C 5.9 06/07/2022   HGBA1C 5.9 06/07/2022   HGBA1C 5.9 06/07/2022      Assessment & Plan:   Problem List Items Addressed This Visit       Cardiovascular and Mediastinum   Essential hypertension    Well-controlled with Amlodipine and Propranolol now Increased dose of Amlodipine to 10 mg QD in the last visit Counseled for compliance with the medications Advised DASH diet and moderate exercise/walking, at least 150 mins/week      Migraine    On Aimovig and propranolol for ppx Followed by neurology - Dr. Krista Blue        Endocrine   DM type 2 with diabetic peripheral neuropathy ([place]) - Primary    Lab Results  Component Value Date   HGBA1C 5.9 06/07/2022   HGBA1C 5.9  06/07/2022   HGBA1C 5.9 06/07/2022  Associated with HTN and HLD Well-controlled with Ozempic 1 mg qw - recent GI symptoms likely due to acute gastroenteritis, had to hold Ozempic to avoid aggravation of her symptoms, advised to start taking it again Advised to follow diabetic diet On statin F/u CMP and lipid panel Diabetic eye exam: Advised to follow up with Ophthalmology for diabetic eye exam  Relevant Orders   CMP14+EGFR   Hemoglobin A1c     Nervous and Auditory   Localization-related epilepsy ([place])    Takes Trileptal Followed by Neurology      Hydrocephalus [facility])    Has history of Arnold-Chiari malformation VP shunt for hydrocephalus, but had recent increased size of ventricles noted on CT of head - had reprogramming on 10/25/22 Followed by neurosurgeon       No orders of the defined types were placed in this encounter.   Follow-up: Return in about 3 months (around 02/09/2023) for Annual physical.    [name], MD

## 2022-11-11 NOTE — Assessment & Plan Note (Signed)
Well-controlled with Amlodipine and Propranolol now Increased dose of Amlodipine to 10 mg QD in the last visit Counseled for compliance with the medications Advised DASH diet and moderate exercise/walking, at least 150 mins/week

## 2022-11-11 NOTE — Assessment & Plan Note (Signed)
Takes Trileptal Followed by Neurology

## 2022-11-11 NOTE — Patient Instructions (Signed)
Please start taking Ozempic again.  Please continue taking other medications as prescribed.  Please continue to follow low carb diet and ambulate as tolerated.

## 2022-11-11 NOTE — Assessment & Plan Note (Addendum)
Lab Results  Component Value Date   HGBA1C 5.9 06/07/2022   HGBA1C 5.9 06/07/2022   HGBA1C 5.9 06/07/2022   Associated with HTN and HLD Well-controlled with Ozempic 1 mg qw - recent GI symptoms likely due to acute gastroenteritis, had to hold Ozempic to avoid aggravation of her symptoms, advised to start taking it again Advised to follow diabetic diet On statin F/u CMP and lipid panel Diabetic eye exam: Advised to follow up with Ophthalmology for diabetic eye exam

## 2022-11-11 NOTE — Assessment & Plan Note (Signed)
Has history of Arnold-Chiari malformation VP shunt for hydrocephalus, but had recent increased size of ventricles noted on CT of head - had reprogramming on 10/25/22 Followed by neurosurgeon

## 2022-11-12 LAB — CMP14+EGFR
ALT: 21 IU/L (ref 0–32)
AST: 22 IU/L (ref 0–40)
Albumin/Globulin Ratio: 2.6 — ABNORMAL HIGH (ref 1.2–2.2)
Albumin: 4.5 g/dL (ref 3.8–4.9)
Alkaline Phosphatase: 133 IU/L — ABNORMAL HIGH (ref 44–121)
BUN/Creatinine Ratio: 11 (ref 9–23)
BUN: 8 mg/dL (ref 6–24)
Bilirubin Total: 0.3 mg/dL (ref 0.0–1.2)
CO2: 23 mmol/L (ref 20–29)
Calcium: 9.3 mg/dL (ref 8.7–10.2)
Chloride: 98 mmol/L (ref 96–106)
Creatinine, Ser: 0.73 mg/dL (ref 0.57–1.00)
Globulin, Total: 1.7 g/dL (ref 1.5–4.5)
Glucose: 121 mg/dL — ABNORMAL HIGH (ref 70–99)
Potassium: 3.9 mmol/L (ref 3.5–5.2)
Sodium: 138 mmol/L (ref 134–144)
Total Protein: 6.2 g/dL (ref 6.0–8.5)
eGFR: 95 mL/min/{1.73_m2} (ref >59)

## 2022-11-12 LAB — HEMOGLOBIN A1C
Est. average glucose Bld gHb Est-mCnc: 128 mg/dL
Hgb A1c MFr Bld: 6.1 % — ABNORMAL HIGH (ref 4.8–5.6)

## 2022-12-02 ENCOUNTER — Other Ambulatory Visit: Payer: Self-pay | Admitting: Internal Medicine

## 2022-12-02 DIAGNOSIS — I10 Essential (primary) hypertension: Secondary | ICD-10-CM

## 2022-12-09 ENCOUNTER — Encounter: Payer: Self-pay | Admitting: Family Medicine

## 2022-12-09 ENCOUNTER — Ambulatory Visit (INDEPENDENT_AMBULATORY_CARE_PROVIDER_SITE_OTHER): Payer: Medicare PPO | Admitting: Family Medicine

## 2022-12-09 VITALS — Ht 62.0 in | Wt 184.0 lb

## 2022-12-09 DIAGNOSIS — Z122 Encounter for screening for malignant neoplasm of respiratory organs: Secondary | ICD-10-CM

## 2022-12-09 DIAGNOSIS — Z0000 Encounter for general adult medical examination without abnormal findings: Secondary | ICD-10-CM

## 2022-12-09 NOTE — Progress Notes (Signed)
Subjective:   Amanda Webster is a 62 y.o. female who presents for Medicare Annual (Subsequent) preventive examination.   I connected with  Amanda Webster on 12/09/22 by a audio enabled telemedicine application and verified that I am speaking with the correct person using two identifiers.  Patient Location: Home  Provider Location: Office/Clinic  I discussed the limitations of evaluation and management by telemedicine. The patient expressed understanding and agreed to proceed.  Review of Systems    Patient denies pain, fever, chills, chest pain, palpations, shortness of breath, blurred vision,cough, abdominal pain, nausea, vomiting, headache, dizziness. Patient is not feeling nervous or anxious.       Objective:    Today's Vitals   12/09/22 1510  Weight: 184 lb (83.5 kg)  Height: 5\' 2"  (1.575 m)   Body mass index is 33.65 kg/m.     12/09/2022    3:17 PM 12/07/2021    2:39 PM 07/20/2016    3:46 PM 07/18/2016    5:02 AM  Advanced Directives  Does Patient Have a Medical Advance Directive? No No No No  Would patient like information on creating a medical advance directive? No - Patient declined Yes (ED - Information included in AVS)      Current Medications (verified) Outpatient Encounter Medications as of 12/09/2022  Medication Sig   Accu-Chek Softclix Lancets lancets 1 each by Other route daily. as directed   AIMOVIG 140 MG/ML SOAJ Inject 140 mg into the skin daily.   amLODipine (NORVASC) 10 MG tablet TAKE 1 TABLET(10 MG) BY MOUTH DAILY   aspirin EC 81 MG tablet Take 1 tablet by mouth daily.   atorvastatin (LIPITOR) 20 MG tablet TAKE 1 TABLET EVERY DAY   cetirizine (ZYRTEC) 10 MG tablet TAKE 1 TABLET EVERY DAY   famotidine (PEPCID) 20 MG tablet Take 1 tablet (20 mg total) by mouth daily.   fluticasone (FLONASE) 50 MCG/ACT nasal spray Place 1 spray into both nostrils daily.   gabapentin (NEURONTIN) 300 MG capsule Take 600 mg by mouth at bedtime.   glucose blood  (ACCU-CHEK AVIVA PLUS) test strip 1 each by Other route daily. Use as instructed   naproxen sodium (ALEVE) 220 MG tablet Take 440 mg by mouth daily as needed (for pain).   Omega-3 Fatty Acids (FISH OIL) 1000 MG CAPS Take 2 capsules by mouth daily.   ondansetron (ZOFRAN-ODT) 4 MG disintegrating tablet Take by mouth.   Oxcarbazepine (TRILEPTAL) 300 MG tablet Take 300 mg by mouth 2 (two) times daily.   polyethylene glycol powder (GLYCOLAX/MIRALAX) 17 GM/SCOOP powder Take 17 g by mouth 2 (two) times daily as needed.   propranolol (INDERAL) 60 MG tablet Take 1 tablet (60 mg total) by mouth every morning. (Patient taking differently: Take 80 mg by mouth every morning.)   Semaglutide, 1 MG/DOSE, (OZEMPIC, 1 MG/DOSE,) 4 MG/3ML SOPN INJECT 1MG  UNDER THE SKIN ONE TIME WEEKLY   sennosides-docusate sodium (SENOKOT-S) 8.6-50 MG tablet Take 1 tablet by mouth daily.   UNABLE TO FIND Aleve pain relieving lotion-daily   VITAMIN D PO Take 10,000 Units by mouth daily.   No facility-administered encounter medications on file as of 12/09/2022.    Allergies (verified) Cymbalta [duloxetine hcl], Methocarbamol, Propoxyphene n-acetaminophen, and Sulfa antibiotics   History: Past Medical History:  Diagnosis Date   Allergy    Anxiety    Arnold-Chiari malformation (HCC)    Arthritis    Borderline diabetic    Chronic headaches    Colon polyps  nonadenomatous   Depression    Diabetes mellitus without complication ([place])    Diverticulosis    Esophageal stricture    FROZEN LEFT SHOULDER 04/01/2010   Qualifier: Diagnosis of  By: Aline Brochure MD, Dorothyann Peng     GERD (gastroesophageal reflux disease)    HTN (hypertension)    Hyperlipidemia    IBS (irritable bowel syndrome)    Seizures ([place])    pt not sure when her last seizure was- she says she just spaces out- no shaking but she could not tell me last seizure-  on [place] bid for seizures    Sleep apnea    no cpap now- did use 02 with cpap but has not used either  in "years"   Past Surgical History:  Procedure Laterality Date   BREAST BIOPSY Left    cahri decompression  6 and 03/1999   CHOLECYSTECTOMY     COLONOSCOPY     invasive cervical traction  02/2009   left breast-lumpectomy     POLYPECTOMY     shunt put in on right side of brain     SHUNT REPLACEMENT  12/10   TOTAL ABDOMINAL HYSTERECTOMY  Age 63 yrs   TAH & BSO   TUBAL LIGATION     UPPER GASTROINTESTINAL ENDOSCOPY     Family History  Problem Relation Age of Onset   Colon polyps Mother    Breast cancer Mother    Lung cancer Mother    Heart disease Father    Stroke Father    Aortic aneurysm Father    Chiari malformation Daughter    Addison's disease Son    Breast cancer Maternal Aunt    Colon cancer Neg Hx    Esophageal cancer Neg Hx    Rectal cancer Neg Hx    Stomach cancer Neg Hx    Social History   Socioeconomic History   Marital status: Married    Spouse name: Not on file   Number of children: 3   Years of education: Not on file   Highest education level: Not on file  Occupational History   Not on file  Tobacco Use   Smoking status: Former    Packs/day: 1.50    Years: 30.00    Additional pack years: 0.00    Total pack years: 45.00    Types: Cigarettes    Quit date: 09/13/1990    Years since quitting: 32.2    Passive exposure: Never   Smokeless tobacco: Never  Vaping Use   Vaping Use: Never used  Substance and Sexual Activity   Alcohol use: No   Drug use: No   Sexual activity: Not Currently    Birth control/protection: Surgical    Comment: hyst  Other Topics Concern   Not on file  Social History Narrative   Disabled from Chiari malformation/seizures.. Drinks 4-5 cups of tea daily. Does not get regular exercise. Married for 40 years.   Social Determinants of Health   Financial Resource Strain: Low Risk  (12/09/2022)   Overall Financial Resource Strain (CARDIA)    Difficulty of Paying Living Expenses: Not hard at all  Food Insecurity: No Food Insecurity  (12/09/2022)   Hunger Vital Sign    Worried About Running Out of Food in the Last Year: Never true    Ran Out of Food in the Last Year: Never true  Transportation Needs: No Transportation Needs (12/09/2022)   PRAPARE - Hydrologist (Medical): No    Lack of Transportation (  Non-Medical): No  Physical Activity: Insufficiently Active (12/09/2022)   Exercise Vital Sign    Days of Exercise per Week: 1 day    Minutes of Exercise per Session: 60 min  Stress: Stress Concern Present (12/09/2022)   [place]    Feeling of Stress : Rather much  Social Connections: Moderately Integrated (12/09/2022)   Social Connection and Isolation Panel [NHANES]    Frequency of Communication with Friends and Family: More than three times a week    Frequency of Social Gatherings with Friends and Family: More than three times a week    Attends Religious Services: More than 4 times per year    Active Member of [employer] or Organizations: No    Attends Music therapist: Never    Marital Status: Married    Tobacco Counseling Counseling given: Not Answered   Clinical Intake:  Pre-visit preparation completed: No  Pain : 0-10 Pain Location: Back     BMI - recorded: 33.65 Nutritional Status: BMI > 30  Obese Diabetes: Yes CBG done?: No Did pt. bring in CBG monitor from home?: No  How often do you need to have someone help you when you read instructions, pamphlets, or other written materials from your doctor or pharmacy?: 1 - Never  Diabetic? No hemoglobin A1c  Interpreter Needed?: No      Activities of Daily Living    12/09/2022    3:17 PM  In your present state of health, do you have any difficulty performing the following activities:  Hearing? 0  Vision? 0  Difficulty concentrating or making decisions? 0  Walking or climbing stairs? 1  Dressing or bathing? 0  Doing errands, shopping? 1   Preparing Food and eating ? N  Using the Toilet? N  In the past six months, have you accidently leaked urine? N  Do you have problems with loss of bowel control? N  Managing your Medications? N  Managing your Finances? N  Housekeeping or managing your Housekeeping? Y    Patient Care Team: [name], MD as PCP - General (Internal Medicine) Harl Bowie Alphonse Guild, MD as PCP - Cardiology (Cardiology) Atilano Ina, MD as Referring Physician (Neurosurgery)  Indicate any recent Medical Services you may have received from other than [facility] providers in the past year (date may be approximate).     Assessment:   This is a routine wellness examination for Amanda Webster.  Hearing/Vision screen No results found.  Dietary issues and exercise activities discussed:     Goals Addressed   None    Depression Screen    12/09/2022    3:15 PM 11/11/2022    3:29 PM 10/05/2022   11:37 AM 09/07/2022   11:22 AM 08/12/2022    3:54 PM 06/07/2022    1:16 PM 03/22/2022   10:04 AM  PHQ 2/9 Scores  PHQ - 2 Score 0 0 3 2 2  0 0  PHQ- 9 Score  7 8 11 11       Fall Risk    12/09/2022    3:17 PM 11/11/2022    3:29 PM 10/05/2022   11:37 AM 09/07/2022   11:22 AM 08/12/2022    3:54 PM  Fall Risk   Falls in the past year? 1 1 1 1 1   Number falls in past yr: 1 1 1 1 1   Injury with Fall? 1 0 0 0 0  Risk for fall due to :     No Fall  Risks  Follow up     Falls evaluation completed    FALL RISK PREVENTION PERTAINING TO THE HOME:  Any stairs in or around the home? Yes  If so, are there any without handrails? No  Home free of loose throw rugs in walkways, pet beds, electrical cords, etc? Yes  Adequate lighting in your home to reduce risk of falls? Yes   ASSISTIVE DEVICES UTILIZED TO PREVENT FALLS:  Life alert? No  Use of a cane, walker or w/c? Yes  Grab bars in the bathroom? No  Shower chair or bench in shower? No  Elevated toilet seat or a handicapped toilet? Yes    Cognitive Function:         12/09/2022    3:19 PM 12/07/2021    2:43 PM 12/01/2020    9:41 AM  6CIT Screen  What Year? 0 points 0 points 0 points  What month? 0 points 0 points 0 points  What time? 0 points 0 points 0 points  Count back from 20 0 points 0 points 0 points  Months in reverse 0 points 0 points 2 points  Repeat phrase 0 points 0 points 0 points  Total Score 0 points 0 points 2 points    Immunizations Immunization History  Administered Date(s) Administered   Tdap 07/29/2020   Zoster Recombinat (Shingrix) 10/07/2021, 02/10/2022    TDAP status: Up to date  Flu Vaccine status: Up to date  Pneumococcal vaccine status: Up to date  Covid-19 vaccine status: Information provided on how to obtain vaccines.   Qualifies for Shingles Vaccine? Yes   Zostavax completed Yes   Shingrix Completed?: Yes  Screening Tests Health Maintenance  Topic Date Due   Medicare Annual Wellness (AWV)  12/08/2022   INFLUENZA VACCINE  12/12/2022 (Originally 04/13/2022)   COVID-19 Vaccine (1) 12/25/2022 (Originally 01/08/1966)   Diabetic kidney evaluation - Urine ACR  02/04/2023   HEMOGLOBIN A1C  05/12/2023   OPHTHALMOLOGY EXAM  07/06/2023   Diabetic kidney evaluation - eGFR measurement  11/11/2023   FOOT EXAM  11/11/2023   MAMMOGRAM  09/08/2024   COLONOSCOPY (Pts 45-98yrs Insurance coverage will need to be confirmed)  12/26/2026   DTaP/Tdap/Td (2 - Td or Tdap) 07/29/2030   Hepatitis C Screening  Completed   HIV Screening  Completed   Zoster Vaccines- Shingrix  Completed   HPV VACCINES  Aged Out   PAP SMEAR-Modifier  Discontinued    Health Maintenance  Health Maintenance Due  Topic Date Due   Medicare Annual Wellness (AWV)  12/08/2022    Colorectal cancer screening: Type of screening: Colonoscopy. Completed  . Repeat every   years Next due on 12/26/2026  Mammogram status: Completed 12/23. Repeat every year  Bone Density status: Completed 09/2020. Results reflect: Bone density results: NORMAL. Repeat  every   years.  Lung Cancer Screening: (Low Dose CT Chest recommended if Age 56-80 years, 30 pack-year currently smoking OR have quit w/in 15years.) does qualify.   Lung Cancer Screening Referral: Ordered 11/2022  Additional Screening:  Hepatitis C Screening: does not qualify; Completed 2021 Negative  Vision Screening: Recommended annual ophthalmology exams for early detection of glaucoma and other disorders of the eye. Is the patient up to date with their annual eye exam?  Yes  Who is the provider or what is the name of the office in which the patient attends annual eye exams? [facility] If pt is not established with a provider, would they like to be referred to a provider to establish  care? No .   Dental Screening: Recommended annual dental exams for proper oral hygiene  Community Resource Referral / Chronic Care Management: CRR required this visit?  No   CCM required this visit?  No      Plan:     I have personally reviewed and noted the following in the patient's chart:   Medical and social history Use of alcohol, tobacco or illicit drugs  Current medications and supplements including opioid prescriptions. Patient is not currently taking opioid prescriptions. Functional ability and status Nutritional status Physical activity Advanced directives List of other physicians Hospitalizations, surgeries, and ER visits in previous 12 months Vitals Screenings to include cognitive, depression, and falls Referrals and appointments  In addition, I have reviewed and discussed with patient certain preventive protocols, quality metrics, and best practice recommendations. A written personalized care plan for preventive services as well as general preventive health recommendations were provided to patient.     [place], FNP   12/09/2022        Past Surgical History:  Procedure Laterality Date   BREAST BIOPSY Left    cahri decompression  6 and 03/1999    CHOLECYSTECTOMY     COLONOSCOPY     invasive cervical traction  02/2009   left breast-lumpectomy     POLYPECTOMY     shunt put in on right side of brain     SHUNT REPLACEMENT  12/10   TOTAL ABDOMINAL HYSTERECTOMY  Age 80 yrs   TAH & BSO   TUBAL LIGATION     UPPER GASTROINTESTINAL ENDOSCOPY     Family History  Problem Relation Age of Onset   Colon polyps Mother    Breast cancer Mother    Lung cancer Mother    Heart disease Father    Stroke Father    Aortic aneurysm Father    Chiari malformation Daughter    Addison's disease Son    Breast cancer Maternal Aunt    Colon cancer Neg Hx    Esophageal cancer Neg Hx    Rectal cancer Neg Hx    Stomach cancer Neg Hx    Social History   Socioeconomic History   Marital status: Married    Spouse name: Not on file   Number of children: 3   Years of education: Not on file   Highest education level: Not on file  Occupational History   Not on file  Tobacco Use   Smoking status: Former    Packs/day: 1.50    Years: 30.00    Additional pack years: 0.00    Total pack years: 45.00    Types: Cigarettes    Quit date: 09/13/1990    Years since quitting: 32.2    Passive exposure: Never   Smokeless tobacco: Never  Vaping Use   Vaping Use: Never used  Substance and Sexual Activity   Alcohol use: No   Drug use: No   Sexual activity: Not Currently    Birth control/protection: Surgical    Comment: hyst  Other Topics Concern   Not on file  Social History Narrative   Disabled from Chiari malformation/seizures.. Drinks 4-5 cups of tea daily. Does not get regular exercise. Married for 40 years.   Social Determinants of Health   Financial Resource Strain: Low Risk  (12/09/2022)   Overall Financial Resource Strain (CARDIA)    Difficulty of Paying Living Expenses: Not hard at all  Food Insecurity: No Food Insecurity (12/09/2022)   Hunger Vital Sign  Worried About Charity fundraiser in the Last Year: Never true    [place] in the  Last Year: Never true  Transportation Needs: No Transportation Needs (12/09/2022)   PRAPARE - Hydrologist (Medical): No    Lack of Transportation (Non-Medical): No  Physical Activity: Insufficiently Active (12/09/2022)   Exercise Vital Sign    Days of Exercise per Week: 1 day    Minutes of Exercise per Session: 60 min  Stress: Stress Concern Present (12/09/2022)   [place]    Feeling of Stress : Rather much  Social Connections: Moderately Integrated (12/09/2022)   Social Connection and Isolation Panel [NHANES]    Frequency of Communication with Friends and Family: More than three times a week    Frequency of Social Gatherings with Friends and Family: More than three times a week    Attends Religious Services: More than 4 times per year    Active Member of [employer] or Organizations: No    Attends Music therapist: Never    Marital Status: Married    Tobacco Counseling Counseling given: Not Answered   Clinical Intake:  Pre-visit preparation completed: No  Pain : 0-10 Pain Location: Back     BMI - recorded: 33.65 Nutritional Status: BMI > 30  Obese Diabetes: Yes CBG done?: No Did pt. bring in CBG monitor from home?: No  How often do you need to have someone help you when you read instructions, pamphlets, or other written materials from your doctor or pharmacy?: 1 - Never  Diabetic? no  Interpreter Needed?: No      Activities of Daily Living    12/09/2022    3:17 PM  In your present state of health, do you have any difficulty performing the following activities:  Hearing? 0  Vision? 0  Difficulty concentrating or making decisions? 0  Walking or climbing stairs? 1  Dressing or bathing? 0  Doing errands, shopping? 1  Preparing Food and eating ? N  Using the Toilet? N  In the past six months, have you accidently leaked urine? N  Do you have problems with loss  of bowel control? N  Managing your Medications? N  Managing your Finances? N  Housekeeping or managing your Housekeeping? Y    Patient Care Team: [name], MD as PCP - General (Internal Medicine) Harl Bowie Alphonse Guild, MD as PCP - Cardiology (Cardiology) Atilano Ina, MD as Referring Physician (Neurosurgery)  Indicate any recent Medical Services you may have received from other than [facility] providers in the past year (date may be approximate).     Assessment:   This is a routine wellness examination for Amanda Webster.  Hearing/Vision screen No results found.  Dietary issues and exercise activities discussed:   Advise lifestyle modifications follow diet low in saturated fat, reduce dietary salt intake, avoid fatty foods, maintain an exercise routine 3 to 5 days a week for a minimum total of 150 minutes.   Goals Addressed   None    Depression Screen    12/09/2022    3:15 PM 11/11/2022    3:29 PM 10/05/2022   11:37 AM 09/07/2022   11:22 AM 08/12/2022    3:54 PM 06/07/2022    1:16 PM 03/22/2022   10:04 AM  PHQ 2/9 Scores  PHQ - 2 Score 0 0 3 2 2  0 0  PHQ- 9 Score  7 8 11  11  Fall Risk    12/09/2022    3:17 PM 11/11/2022    3:29 PM 10/05/2022   11:37 AM 09/07/2022   11:22 AM 08/12/2022    3:54 PM  Fall Risk   Falls in the past year? 1 1 1 1 1   Number falls in past yr: 1 1 1 1 1   Injury with Fall? 1 0 0 0 0  Risk for fall due to :     No Fall Risks  Follow up     Falls evaluation completed    Amanda Webster:  Any stairs in or around the home? Yes  If so, are there any without handrails? No  Home free of loose throw rugs in walkways, pet beds, electrical cords, etc? Yes  Adequate lighting in your home to reduce risk of falls? Yes   ASSISTIVE DEVICES UTILIZED TO PREVENT FALLS:  Life alert? No  Use of a cane, walker or w/c? Yes  Grab bars in the bathroom? Yes  Shower chair or bench in shower? Yes  Elevated toilet seat or a  handicapped toilet? Yes     Cognitive Function:        12/09/2022    3:19 PM 12/07/2021    2:43 PM 12/01/2020    9:41 AM  6CIT Screen  What Year? 0 points 0 points 0 points  What month? 0 points 0 points 0 points  What time? 0 points 0 points 0 points  Count back from 20 0 points 0 points 0 points  Months in reverse 0 points 0 points 2 points  Repeat phrase 0 points 0 points 0 points  Total Score 0 points 0 points 2 points    Immunizations Immunization History  Administered Date(s) Administered   Tdap 07/29/2020   Zoster Recombinat (Shingrix) 10/07/2021, 02/10/2022    TDAP status: Up to date  Flu Vaccine status: Up to date  Pneumococcal vaccine status: Up to date  Covid-19 vaccine status: Information provided on how to obtain vaccines.   Qualifies for Shingles Vaccine? Yes   Zostavax completed Yes   Shingrix Completed?: Yes  Screening Tests Health Maintenance  Topic Date Due   INFLUENZA VACCINE  12/12/2022 (Originally 04/13/2022)   COVID-19 Vaccine (1) 12/25/2022 (Originally 01/08/1966)   Diabetic kidney evaluation - Urine ACR  02/04/2023   HEMOGLOBIN A1C  05/12/2023   OPHTHALMOLOGY EXAM  07/06/2023   Diabetic kidney evaluation - eGFR measurement  11/11/2023   FOOT EXAM  11/11/2023   Medicare Annual Wellness (AWV)  12/09/2023   MAMMOGRAM  09/08/2024   COLONOSCOPY (Pts 45-59yrs Insurance coverage will need to be confirmed)  12/26/2026   DTaP/Tdap/Td (2 - Td or Tdap) 07/29/2030   Hepatitis C Screening  Completed   HIV Screening  Completed   Zoster Vaccines- Shingrix  Completed   HPV VACCINES  Aged Out   PAP SMEAR-Modifier  Discontinued    Health Maintenance  There are no preventive care reminders to display for this patient.  Colorectal cancer screening: Type of screening: Colonoscopy. Completed 10. Repeat every   years Due one 12/26/2026  Mammogram status: Completed  . Repeat every year  Bone Density status: Completed 2022. Results reflect: Bone density  results: NORMAL. Repeat every   years.  Lung Cancer Screening: (Low Dose CT Chest recommended if Age 59-80 years, 30 pack-year currently smoking OR have quit w/in 15years.) does qualify.   Lung Cancer Screening Referral: Ordered 11/2022  Additional Screening:  Hepatitis C Screening: does not qualify;  Completed 2021 negative  Vision Screening: Recommended annual ophthalmology exams for early detection of glaucoma and other disorders of the eye. Is the patient up to date with their annual eye exam?  Yes  Who is the provider or what is the name of the office in which the patient attends annual eye exams? [facility] If pt is not established with a provider, would they like to be referred to a provider to establish care? No .   Dental Screening: Recommended annual dental exams for proper oral hygiene  Community Resource Referral / Chronic Care Management: CRR required this visit?  No   CCM required this visit?  No      Plan:     I have personally reviewed and noted the following in the patient's chart:   Medical and social history Use of alcohol, tobacco or illicit drugs  Current medications and supplements including opioid prescriptions. Patient is not currently taking opioid prescriptions. Functional ability and status Nutritional status Physical activity Advanced directives List of other physicians Hospitalizations, surgeries, and ER visits in previous 12 months Vitals Screenings to include cognitive, depression, and falls Referrals and appointments  In addition, I have reviewed and discussed with patient certain preventive protocols, quality metrics, and best practice recommendations. A written personalized care plan for preventive services as well as general preventive health recommendations were provided to patient.     [place], [place]   12/09/2022

## 2022-12-15 ENCOUNTER — Ambulatory Visit: Payer: Medicare PPO | Admitting: Neurology

## 2022-12-15 VITALS — BP 136/80 | HR 60 | Ht 62.0 in | Wt 184.0 lb

## 2022-12-15 DIAGNOSIS — I951 Orthostatic hypotension: Secondary | ICD-10-CM | POA: Diagnosis not present

## 2022-12-15 DIAGNOSIS — Z982 Presence of cerebrospinal fluid drainage device: Secondary | ICD-10-CM | POA: Diagnosis not present

## 2022-12-15 DIAGNOSIS — G43709 Chronic migraine without aura, not intractable, without status migrainosus: Secondary | ICD-10-CM | POA: Diagnosis not present

## 2022-12-15 DIAGNOSIS — E1142 Type 2 diabetes mellitus with diabetic polyneuropathy: Secondary | ICD-10-CM | POA: Diagnosis not present

## 2022-12-15 MED ORDER — SUMATRIPTAN SUCCINATE 50 MG PO TABS: ORAL_TABLET | ORAL | 11 refills | Status: DC

## 2022-12-15 NOTE — Procedures (Signed)
Full Name: Amanda Webster Gender: Female MRN #: [id number] Date of Birth: [birth year]    Visit Date: 12/15/2022 10:38 Age: 62 Years Examining Physician: Dr. Marcial Pacas Referring Physician: Dr. Marcial Pacas Height: 5 feet 2 inch History: 62 year old female, with long history of diabetes, bilateral lower extremity paresthesia, frequent orthostatic dizziness, unsteady gait  Summary of the test: Nerve conduction study: Left sural sensory response was absent.   Right sural, bilateral superficial peroneal sensory responses showed significantly decreased snap amplitude  Bilateral tibial, peroneal to EDB motor responses were normal.  Right ulnar sensory and responses were normal.  Right median sensory response were within normal limit.  Right median mixed response was 0.5 ms prolonged compared to ipsilateral ulnar mixed response  Electromyography: Selected needle examinations of right upper, lower extremity muscles, cervical and lumbar paraspinal muscles were normal.  Conclusion: This is a mild abnormal study.  There is electrodiagnostic evidence of mild axonal sensorimotor polyneuropathy, in addition, there is evidence of mild right median neuropathy across the wrist consistent with mild right carpal tunnel syndrome.    ------------------------------- Marcial Pacas M.D. PhD  [facility] [address] [place], [place] [address] Tel: [phone] Fax: [phone]  Verbal informed consent was obtained from the patient, patient was informed of potential risk of procedure, including bruising, bleeding, hematoma formation, infection, muscle weakness, muscle pain, numbness, among others.        [place]    Nerve / Sites Muscle Latency Ref. Amplitude Ref. Rel Amp Segments Distance Velocity Ref. Area    ms ms mV mV %  cm m/s m/s mVms  R Ulnar - ADM     Wrist ADM 2.8 ?3.3 10.6 ?6.0 100 Wrist - ADM 7   32.0     B.Elbow ADM 5.2  10.4  98.4 B.Elbow - Wrist 14 57 ?49  32.0     A.Elbow ADM 7.9  10.1  97.1 A.Elbow - B.Elbow 14.5 54 ?49 31.5  R Peroneal - EDB     Ankle EDB 5.1 ?6.5 4.0 ?2.0 100 Ankle - EDB 9   13.8     Fib head EDB 10.5  3.5  86.7 Fib head - Ankle 24 44 ?44 11.0     Pop fossa EDB 12.2  3.9  111 Pop fossa - Fib head 11 63 ?44 12.5         Pop fossa - Ankle      L Peroneal - EDB     Ankle EDB 5.3 ?6.5 3.5 ?2.0 100 Ankle - EDB 9   12.2     Fib head EDB 11.3  3.8  110 Fib head - Ankle 24.6 41 ?44 14.8     Pop fossa EDB 14.3  3.3  87.4 Pop fossa - Fib head 11.6 39 ?44 14.1         Pop fossa - Ankle      R Tibial - AH     Ankle AH 4.1 ?5.8 12.1 ?4.0 100 Ankle - AH 9   23.7     Pop fossa AH 14.0  8.7  72.3 Pop fossa - Ankle 38 38 ?41 17.9  L Tibial - AH     Ankle AH 3.4 ?5.8 15.8 ?4.0 100 Ankle - AH 9   28.1     Pop fossa AH 15.0  11.3  71.4 Pop fossa - Ankle 38 33 ?41 21.4  [place]    Nerve / Sites Rec. Site Peak Lat Ref.  Amp Ref. Segments Distance Peak Diff Ref.    ms ms V V  cm ms ms  R Sural - Ankle (Calf)     Calf Ankle 3.6 ?4.4 2 ?6 Calf - Ankle 14    L Sural - Ankle (Calf)     Calf Ankle NR ?4.4 NR ?6 Calf - Ankle 14    R Superficial peroneal - Ankle     Lat leg Ankle 4.2 ?4.4 4 ?6 Lat leg - Ankle 14    L Superficial peroneal - Ankle     Lat leg Ankle 2.9 ?4.4 3 ?6 Lat leg - Ankle 14    R Median, Ulnar - Transcarpal comparison     Median Palm Wrist 2.3 ?2.2 31 ?35 Median Palm - Wrist 8       Ulnar Palm Wrist 1.8 ?2.2 19 ?12 Ulnar Palm - Wrist 8          Median Palm - Ulnar Palm  0.5 ?0.4  R Median - Orthodromic (Dig II, Mid palm)     Dig II Wrist 3.1 ?3.4 12 ?10 Dig II - Wrist 13    R Ulnar - Orthodromic, (Dig V, Mid palm)     Dig V Wrist 2.8 ?3.1 10 ?5 Dig V - Wrist 29                     F  Wave    Nerve F Lat Ref.   ms ms  R Tibial - AH 49.7 ?56.0  L Tibial - AH 52.7 ?56.0  R Ulnar - ADM 25.4 ?32.0           EMG Summary Table    Spontaneous MUAP Recruitment  Muscle IA Fib PSW Fasc Other Amp Dur. Poly  Pattern  R. Tibialis anterior Normal None None None _______ Normal Normal Normal Normal  R. Tibialis posterior Normal None None None _______ Normal Normal Normal Normal  R. Peroneus longus Normal None None None _______ Normal Normal Normal Normal  R. Gastrocnemius (Medial head) Normal None None None _______ Normal Normal Normal Normal  R. Vastus lateralis Normal None None None _______ Normal Normal Normal Normal  R. Lumbar paraspinals (low) Normal None None None _______ Normal Normal Normal Normal  R. Lumbar paraspinals (mid) Normal None None None _______ Normal Normal Normal Normal  R. First dorsal interosseous Normal None None None _______ Normal Normal Normal Normal  R. Pronator teres Normal None None None _______ Normal Normal Normal Normal  R. Biceps brachii Normal None None None _______ Normal Normal Normal Normal  R. Deltoid Normal None None None _______ Normal Normal Normal Normal  R. Triceps brachii Normal None None None _______ Normal Normal Normal Normal  R. Cervical paraspinals Normal None None None _______ Normal Normal Normal Normal

## 2022-12-15 NOTE — Progress Notes (Signed)
Chief Complaint  Patient presents with   Procedure    Rm EMG/NCV 4.      ASSESSMENT AND PLAN  Amanda Webster is a 62 y.o. female   History of Chiari decompression, placement of VP shunt  Revision by [facility] Dr. Salomon Fick on August 20, 2022, due to reported slow worsening headache, unsteady gait, presurgical repeat MRI showed worsening ventricular size  Her gait seems to improve after revision  Hyperreflexia in the setting of length-dependent sensory changes,  MRI of the cervical showed ACDF C5-6, C6-7, no evidence of cord compression  EMG nerve conduction study confirmed mild axonal sensorimotor neuropathy, consistent with her diabetic peripheral neuropathy,  Frequent dizziness,  Evidence of orthostatic hypotension today,  Suggesting her increase water intake, tight stocking, optimize diabetic control,  Reported history of seizure,  Spacing spells,  On Trileptal 300mg  bid  EEG was normal in January 2024,  Occasionally headache with migraine features  Imitrex as needed  Return To Clinic With NP In 6 Months, if her symptoms improve with above measures, may consider lower dose of Trileptal 150 mg twice a day, to decrease medication side effect  DIAGNOSTIC DATA (LABS, IMAGING, TESTING) - I reviewed patient records, labs, notes, testing and imaging myself where available.   MEDICAL HISTORY:  Amanda Webster is a 62 year old female, seen in request by her primary care doctor [name], for evaluation of gait abnormality, history of VP shunt, need to establish neurological care, was previously seen by Dr. Merlene Laughter   I reviewed and summarized the referring note. PMHX HTN HLD DM for more than 10 years. Anxiety Seizure, space out, last one was in Dec 2023, brought on by stress Obstructive sleep apnea, could not tolerate CPAP. VP shunt  Patient has a history of posterior decompression surgery for Arnold-Chiari formation in 2000, reported prior to the surgery, she  has frequent headaches, weakness, also bilateral hand muscle weakness, reported surgery was helpful, but she has developed complications," spinal fluid leak?',  Eventually had VP shunt placement,  She was able to go back to to work, then had increased difficulties, constellation of complaints, generalized weakness, unsteady gait, around 2010, went on disability  She also had a history of seizure disorder started shortly after her decompression surgery, initially frequent ajovy triggered by stress, now only has small spells intermittently, spacing out, lasting for few minutes, felt fatigued afterwards, had to go to sleep, is taking Trileptal 300 twice a day  She was seen by [facility] neurosurgeon Dr. Since October 2023 for worsening mental postop, increased headache, also mild unsteady gait, leading to revision with Dr. Salomon Fick August 20, 2022, reported some improvement, walk better,  Personally reviewed MRI of the brain September 2023, prior to most revision, chronic CSF shunt with increased lateral and third ventricular/complex abnormality in 2016, no definite transependymal edema, increased nonspecific signal changes in the left frontal pole,  UPDATE December 15 2022: She complains of frequent dizziness, especially when standing up, risk for fall, limited water intake, orthostatic blood pressure was positive today,  Also complains of intermittent headache, nauseous, lightheaded, improved by sleep, related to weather change, have some migraine features, never tried triptans in the past,  We personally reviewed MRI of cervical spine from February 2024, ACDF C5-6, C6-7, no residual cord or foraminal stenosis, mild degenerative changes at C3-4, evidence of suboccipital craniectomy  Follow-up with neurosurgeon Dr. Buzzy Han in May 2024  PHYSICAL EXAM:   Vitals:   12/15/22 1041  BP: 136/80  Pulse: 60  Weight: 184 lb (83.5 kg)  Height: 5\' 2"  (1.575 m)   Lying down 138/83, HR 61, sitting up 142/84, HR  74, standing up 121/67, HR58. 118/78, 61.   Body mass index is 33.65 kg/m.  PHYSICAL EXAMNIATION:  Gen: NAD, conversant, well nourised, well groomed                     Cardiovascular: Regular rate rhythm, no peripheral edema, warm, nontender. Eyes: Conjunctivae clear without exudates or hemorrhage Neck: Supple, no carotid bruits. Pulmonary: Clear to auscultation bilaterally   NEUROLOGICAL EXAM:  MENTAL STATUS: Speech/cognition: Awake, alert, oriented to history taking and casual conversation CRANIAL NERVES: CN II: Visual fields are full to confrontation. Pupils are round equal and briskly reactive to light. CN III, IV, VI: extraocular movement are normal. No ptosis. CN V: Facial sensation is intact to light touch CN VII: Face is symmetric with normal eye closure  CN VIII: Hearing is normal to causal conversation. CN IX, X: Phonation is normal. CN XI: Head turning and shoulder shrug are intact  MOTOR: Normal bulk and strength  REFLEXES: Reflexes are 2+ and symmetric at the biceps, triceps, knees, and trace ankles. Plantar responses are extensor bilaterally  SENSORY: Length-dependent decreased light touch, pinprick to mid shin level  COORDINATION: There is no trunk or limb dysmetria noted.  GAIT/STANCE: Needs push-up to get up from seated position, cautious  REVIEW OF SYSTEMS:  Full 14 system review of systems performed and notable only for as above All other review of systems were negative.   ALLERGIES: Allergies  Allergen Reactions   Cymbalta [Duloxetine Hcl] Itching    Nasal itching   Methocarbamol    Propoxyphene N-Acetaminophen Itching   Sulfa Antibiotics     HOME MEDICATIONS: Current Outpatient Medications  Medication Sig Dispense Refill   Accu-Chek Softclix Lancets lancets 1 each by Other route daily. as directed 100 each 3   AIMOVIG 140 MG/ML SOAJ Inject 140 mg into the skin daily.     amLODipine (NORVASC) 10 MG tablet TAKE 1 TABLET(10 MG) BY MOUTH  DAILY 90 tablet 1   aspirin EC 81 MG tablet Take 1 tablet by mouth daily.     atorvastatin (LIPITOR) 20 MG tablet TAKE 1 TABLET EVERY DAY 90 tablet 10   cetirizine (ZYRTEC) 10 MG tablet TAKE 1 TABLET EVERY DAY 90 tablet 0   famotidine (PEPCID) 20 MG tablet Take 1 tablet (20 mg total) by mouth daily. 30 tablet 3   fluticasone (FLONASE) 50 MCG/ACT nasal spray Place 1 spray into both nostrils daily. 16 g 2   gabapentin (NEURONTIN) 300 MG capsule Take 600 mg by mouth at bedtime.     glucose blood (ACCU-CHEK AVIVA PLUS) test strip 1 each by Other route daily. Use as instructed 100 strip 3   naproxen sodium (ALEVE) 220 MG tablet Take 440 mg by mouth daily as needed (for pain).     Omega-3 Fatty Acids (FISH OIL) 1000 MG CAPS Take 2 capsules by mouth daily.     ondansetron (ZOFRAN-ODT) 4 MG disintegrating tablet Take by mouth.     Oxcarbazepine (TRILEPTAL) 300 MG tablet Take 300 mg by mouth 2 (two) times daily.     polyethylene glycol powder (GLYCOLAX/MIRALAX) 17 GM/SCOOP powder Take 17 g by mouth 2 (two) times daily as needed. 3350 g 1   propranolol (INDERAL) 60 MG tablet Take 1 tablet (60 mg total) by mouth every morning. (Patient taking differently: Take 80 mg by mouth every morning.) 90  tablet 3   Semaglutide, 1 MG/DOSE, (OZEMPIC, 1 MG/DOSE,) 4 MG/3ML SOPN INJECT 1MG  UNDER THE SKIN ONE TIME WEEKLY 9 mL 10   sennosides-docusate sodium (SENOKOT-S) 8.6-50 MG tablet Take 1 tablet by mouth daily. 30 tablet 2   UNABLE TO FIND Aleve pain relieving lotion-daily     VITAMIN D PO Take 10,000 Units by mouth daily.     No current facility-administered medications for this visit.    PAST MEDICAL HISTORY: Past Medical History:  Diagnosis Date   Allergy    Anxiety    Arnold-Chiari malformation ([place])    Arthritis    Borderline diabetic    Chronic headaches    Colon polyps    nonadenomatous   Depression    Diabetes mellitus without complication ([place])    Diverticulosis    Esophageal stricture     FROZEN LEFT SHOULDER 04/01/2010   Qualifier: Diagnosis of  By: Aline Brochure MD, Dorothyann Peng     GERD (gastroesophageal reflux disease)    HTN (hypertension)    Hyperlipidemia    IBS (irritable bowel syndrome)    Seizures ([place])    pt not sure when her last seizure was- she says she just spaces out- no shaking but she could not tell me last seizure-  on Oxtellar bid for seizures    Sleep apnea    no cpap now- did use 02 with cpap but has not used either in "years"    PAST SURGICAL HISTORY: Past Surgical History:  Procedure Laterality Date   BREAST BIOPSY Left    cahri decompression  6 and 03/1999   CHOLECYSTECTOMY     COLONOSCOPY     invasive cervical traction  02/2009   left breast-lumpectomy     POLYPECTOMY     shunt put in on right side of brain     SHUNT REPLACEMENT  12/10   TOTAL ABDOMINAL HYSTERECTOMY  Age 80 yrs   TAH & BSO   TUBAL LIGATION     UPPER GASTROINTESTINAL ENDOSCOPY      FAMILY HISTORY: Family History  Problem Relation Age of Onset   Colon polyps Mother    Breast cancer Mother    Lung cancer Mother    Heart disease Father    Stroke Father    Aortic aneurysm Father    Chiari malformation Daughter    Addison's disease Son    Breast cancer Maternal Aunt    Colon cancer Neg Hx    Esophageal cancer Neg Hx    Rectal cancer Neg Hx    Stomach cancer Neg Hx     SOCIAL HISTORY: Social History   Socioeconomic History   Marital status: Married    Spouse name: Not on file   Number of children: 3   Years of education: Not on file   Highest education level: Not on file  Occupational History   Not on file  Tobacco Use   Smoking status: Former    Packs/day: 1.50    Years: 30.00    Additional pack years: 0.00    Total pack years: 45.00    Types: Cigarettes    Quit date: 09/13/1990    Years since quitting: 32.2    Passive exposure: Never   Smokeless tobacco: Never  Vaping Use   Vaping Use: Never used  Substance and Sexual Activity   Alcohol use: No   Drug  use: No   Sexual activity: Not Currently    Birth control/protection: Surgical    Comment: hyst  Other Topics  Concern   Not on file  Social History Narrative   Disabled from Chiari malformation/seizures.. Drinks 4-5 cups of tea daily. Does not get regular exercise. Married for 40 years.   Social Determinants of Health   Financial Resource Strain: Low Risk  (12/09/2022)   Overall Financial Resource Strain (CARDIA)    Difficulty of Paying Living Expenses: Not hard at all  Food Insecurity: No Food Insecurity (12/09/2022)   Hunger Vital Sign    Worried About Running Out of Food in the Last Year: Never true    Ran Out of Food in the Last Year: Never true  Transportation Needs: No Transportation Needs (12/09/2022)   PRAPARE - Hydrologist (Medical): No    Lack of Transportation (Non-Medical): No  Physical Activity: Insufficiently Active (12/09/2022)   Exercise Vital Sign    Days of Exercise per Week: 1 day    Minutes of Exercise per Session: 60 min  Stress: Stress Concern Present (12/09/2022)   [place]    Feeling of Stress : Rather much  Social Connections: Moderately Integrated (12/09/2022)   Social Connection and Isolation Panel [NHANES]    Frequency of Communication with Friends and Family: More than three times a week    Frequency of Social Gatherings with Friends and Family: More than three times a week    Attends Religious Services: More than 4 times per year    Active Member of [employer] or Organizations: No    Attends Archivist Meetings: Never    Marital Status: Married  Human resources officer Violence: Not At Risk (12/09/2022)   Humiliation, Afraid, Rape, and Kick questionnaire    Fear of Current or Ex-Partner: No    Emotionally Abused: No    Physically Abused: No    Sexually Abused: No      Marcial Pacas, M.D. Ph.D.  [facility] [address], [place], [place] [address] Ph: [phone] Fax: [phone]  CC:  [name], MD [address] [place],  [place] [address]  [name], MD

## 2022-12-27 ENCOUNTER — Encounter: Payer: Self-pay | Admitting: Internal Medicine

## 2022-12-28 NOTE — Telephone Encounter (Signed)
scheduled

## 2022-12-29 ENCOUNTER — Ambulatory Visit (HOSPITAL_COMMUNITY)
Admission: RE | Admit: 2022-12-29 | Discharge: 2022-12-29 | Disposition: A | Discharge status: Home / Self Care | Payer: Medicare PPO | Source: Ambulatory Visit | Attending: Internal Medicine | Admitting: Internal Medicine

## 2022-12-29 ENCOUNTER — Ambulatory Visit: Payer: Medicare PPO | Admitting: Internal Medicine

## 2022-12-29 ENCOUNTER — Ambulatory Visit (HOSPITAL_BASED_OUTPATIENT_CLINIC_OR_DEPARTMENT_OTHER)
Admission: RE | Admit: 2022-12-29 | Discharge: 2022-12-29 | Disposition: A | Discharge status: Home / Self Care | Payer: Medicare PPO | Source: Ambulatory Visit | Attending: Internal Medicine | Admitting: Internal Medicine

## 2022-12-29 ENCOUNTER — Encounter: Payer: Self-pay | Admitting: Internal Medicine

## 2022-12-29 VITALS — BP 117/78 | HR 72 | Ht 62.0 in | Wt 178.2 lb

## 2022-12-29 DIAGNOSIS — I10 Essential (primary) hypertension: Secondary | ICD-10-CM

## 2022-12-29 DIAGNOSIS — G40109 Localization-related (focal) (partial) symptomatic epilepsy and epileptic syndromes with simple partial seizures, not intractable, without status epilepticus: Secondary | ICD-10-CM

## 2022-12-29 DIAGNOSIS — Z982 Presence of cerebrospinal fluid drainage device: Secondary | ICD-10-CM

## 2022-12-29 DIAGNOSIS — R531 Weakness: Secondary | ICD-10-CM | POA: Diagnosis not present

## 2022-12-29 DIAGNOSIS — G919 Hydrocephalus, unspecified: Secondary | ICD-10-CM | POA: Diagnosis not present

## 2022-12-29 NOTE — Assessment & Plan Note (Signed)
Followed by neurosurgery at [facility] Unclear if her current symptoms are due to hydrocephalus Could be recent TIA - on aspirin and statin currently Check CT head stat

## 2022-12-29 NOTE — Assessment & Plan Note (Signed)
Has history of Arnold-Chiari malformation VP shunt for hydrocephalus, but had recent increased size of ventricles noted on CT of head - had reprogramming on 10/25/22 Followed by neurosurgeon Recurrent weakness of the RUE and b/l LE and visual disturbance with gait imbalance, needs CT head stat Advised to contact neurosurgeon

## 2022-12-29 NOTE — Patient Instructions (Signed)
Please take half tablet of Amlodipine once daily for now.  Please continue to take other medications as prescribed.  Please continue to follow low carb diet and ambulate as tolerated.

## 2022-12-29 NOTE — Progress Notes (Signed)
Established Patient Office Visit  Subjective:  Patient ID: Amanda Webster, female    DOB: [birth year]  Age: 62 y.o. MRN: [id number]  CC:  Chief Complaint  Patient presents with   Extremity Weakness    Patient states her feet and lower legs have been feeling weak. Amanda Webster is feeling unbalanced     HPI Amanda Webster is a 62 y.o. female with past medical history of HTN, type II DM with HLD, polyarthritis, migraine, seizure disorder, allergic rhinitis and morbid obesity who presents for c/o weakness of right UE and b/l LE for the last 1 month.  Amanda Webster reports weakness of the right UE and bilateral LE with visual disturbance, which has been progressive for the last 2 days.  Amanda Webster tried to mow her lawn 2 days ago, and felt extremely fatigued after it.  Amanda Webster had to stand by the lawn more until her father in law had to get inside the home.  Amanda Webster could not get up from the bed for few hours due to extreme tiredness.  Amanda Webster denies any fever, chills, nausea, vomiting or diarrhea.  Amanda Webster reports that the weakness of the extremities and visual disturbance had been going on before this episode.  Amanda Webster has history of hydrocephalus s/p VP shunt placement and recent change in settings (12/23).  Amanda Webster had EEG in 01/24, which was unremarkable.  Amanda Webster had NCS/EMG 2 weeks ago by Dr Terrace Arabia, which showed mild axonal sensorimotor neuropathy.  Amanda Webster is already on gabapentin and amitriptyline.  Her BP is wnl today.  Her blood glucose has been between 100-150 mostly.  Denies skipping meals.  Amanda Webster has history of orthostatic hypotension and has been trying to increase water intake and has wearing tight stockings.  Past Medical History:  Diagnosis Date   Allergy    Anxiety    Arnold-Chiari malformation    Arthritis    Borderline diabetic    Chronic headaches    Colon polyps    nonadenomatous   Depression    Diabetes mellitus without complication    Diverticulosis    Esophageal stricture    FROZEN LEFT SHOULDER 04/01/2010    Qualifier: Diagnosis of  By: Romeo Apple MD, Duffy Rhody     GERD (gastroesophageal reflux disease)    HTN (hypertension)    Hyperlipidemia    IBS (irritable bowel syndrome)    Seizures    pt not sure when her last seizure was- Amanda Webster says Amanda Webster just spaces out- no shaking but Amanda Webster could not tell me last seizure-  on [place] bid for seizures    Sleep apnea    no cpap now- did use 02 with cpap but has not used either in "years"    Past Surgical History:  Procedure Laterality Date   BREAST BIOPSY Left    cahri decompression  8 and 03/1999   CHOLECYSTECTOMY     COLONOSCOPY     invasive cervical traction  02/2009   left breast-lumpectomy     POLYPECTOMY     shunt put in on right side of brain     SHUNT REPLACEMENT  12/10   TOTAL ABDOMINAL HYSTERECTOMY  Age 50 yrs   TAH & BSO   TUBAL LIGATION     UPPER GASTROINTESTINAL ENDOSCOPY      Family History  Problem Relation Age of Onset   Colon polyps Mother    Breast cancer Mother    Lung cancer Mother    Heart disease Father    Stroke Father    Aortic  aneurysm Father    Chiari malformation Daughter    Addison's disease Son    Breast cancer Maternal Aunt    Colon cancer Neg Hx    Esophageal cancer Neg Hx    Rectal cancer Neg Hx    Stomach cancer Neg Hx     Social History   Socioeconomic History   Marital status: Married    Spouse name: Not on file   Number of children: 3   Years of education: Not on file   Highest education level: Not on file  Occupational History   Not on file  Tobacco Use   Smoking status: Former    Packs/day: 1.50    Years: 30.00    Additional pack years: 0.00    Total pack years: 45.00    Types: Cigarettes    Quit date: 09/13/1990    Years since quitting: 32.3    Passive exposure: Never   Smokeless tobacco: Never  Vaping Use   Vaping Use: Never used  Substance and Sexual Activity   Alcohol use: No   Drug use: No   Sexual activity: Not Currently    Birth control/protection: Surgical    Comment: hyst   Other Topics Concern   Not on file  Social History Narrative   Disabled from Chiari malformation/seizures.. Drinks 4-5 cups of tea daily. Does not get regular exercise. Married for 40 years.   Social Determinants of Health   Financial Resource Strain: Low Risk  (12/09/2022)   Overall Financial Resource Strain (CARDIA)    Difficulty of Paying Living Expenses: Not hard at all  Food Insecurity: No Food Insecurity (12/09/2022)   Hunger Vital Sign    Worried About Running Out of Food in the Last Year: Never true    Ran Out of Food in the Last Year: Never true  Transportation Needs: No Transportation Needs (12/09/2022)   PRAPARE - Administrator, Civil Service (Medical): No    Lack of Transportation (Non-Medical): No  Physical Activity: Insufficiently Active (12/09/2022)   Exercise Vital Sign    Days of Exercise per Week: 1 day    Minutes of Exercise per Session: 60 min  Stress: Stress Concern Present (12/09/2022)   [employer] of Occupational Health - Occupational Stress Questionnaire    Feeling of Stress : Rather much  Social Connections: Moderately Integrated (12/09/2022)   Social Connection and Isolation Panel [NHANES]    Frequency of Communication with Friends and Family: More than three times a week    Frequency of Social Gatherings with Friends and Family: More than three times a week    Attends Religious Services: More than 4 times per year    Active Member of [employer] or Organizations: No    Attends Banker Meetings: Never    Marital Status: Married  Catering manager Violence: Not At Risk (12/09/2022)   Humiliation, Afraid, Rape, and Kick questionnaire    Fear of Current or Ex-Partner: No    Emotionally Abused: No    Physically Abused: No    Sexually Abused: No    Outpatient Medications Prior to Visit  Medication Sig Dispense Refill   Accu-Chek Softclix Lancets lancets 1 each by Other route daily. as directed 100 each 3   AIMOVIG 140 MG/ML SOAJ  Inject 140 mg into the skin daily.     amLODipine (NORVASC) 10 MG tablet TAKE 1 TABLET(10 MG) BY MOUTH DAILY 90 tablet 1   aspirin EC 81 MG tablet Take 1 tablet by mouth  daily.     atorvastatin (LIPITOR) 20 MG tablet TAKE 1 TABLET EVERY DAY 90 tablet 10   cetirizine (ZYRTEC) 10 MG tablet TAKE 1 TABLET EVERY DAY 90 tablet 0   famotidine (PEPCID) 20 MG tablet Take 1 tablet (20 mg total) by mouth daily. 30 tablet 3   fluticasone (FLONASE) 50 MCG/ACT nasal spray Place 1 spray into both nostrils daily. 16 g 2   gabapentin (NEURONTIN) 300 MG capsule Take 600 mg by mouth at bedtime.     glucose blood (ACCU-CHEK AVIVA PLUS) test strip 1 each by Other route daily. Use as instructed 100 strip 3   naproxen sodium (ALEVE) 220 MG tablet Take 440 mg by mouth daily as needed (for pain).     Omega-3 Fatty Acids (FISH OIL) 1000 MG CAPS Take 2 capsules by mouth daily.     ondansetron (ZOFRAN-ODT) 4 MG disintegrating tablet Take by mouth.     Oxcarbazepine (TRILEPTAL) 300 MG tablet Take 300 mg by mouth 2 (two) times daily.     polyethylene glycol powder (GLYCOLAX/MIRALAX) 17 GM/SCOOP powder Take 17 g by mouth 2 (two) times daily as needed. 3350 g 1   propranolol (INDERAL) 60 MG tablet Take 1 tablet (60 mg total) by mouth every morning. (Patient taking differently: Take 80 mg by mouth every morning.) 90 tablet 3   Semaglutide, 1 MG/DOSE, (OZEMPIC, 1 MG/DOSE,) 4 MG/3ML SOPN INJECT 1MG  UNDER THE SKIN ONE TIME WEEKLY 9 mL 10   sennosides-docusate sodium (SENOKOT-S) 8.6-50 MG tablet Take 1 tablet by mouth daily. 30 tablet 2   SUMAtriptan (IMITREX) 50 MG tablet May repeat in 2 hours if headache persists or recurs. 12 tablet 11   UNABLE TO FIND Aleve pain relieving lotion-daily     VITAMIN D PO Take 10,000 Units by mouth daily.     No facility-administered medications prior to visit.    Allergies  Allergen Reactions   Cymbalta [Duloxetine Hcl] Itching    Nasal itching   Methocarbamol    Propoxyphene  N-Acetaminophen Itching   Sulfa Antibiotics     ROS Review of Systems  Constitutional:  Positive for fatigue. Negative for chills and fever.  HENT:  Negative for congestion, sinus pressure and sinus pain.   Eyes:  Positive for visual disturbance. Negative for pain and discharge.  Respiratory:  Negative for cough and shortness of breath.   Cardiovascular:  Negative for chest pain and palpitations.  Gastrointestinal:  Positive for constipation. Negative for diarrhea, nausea and vomiting.  Genitourinary:  Negative for frequency and hematuria.  Musculoskeletal:  Positive for arthralgias, back pain and gait problem. Negative for neck pain and neck stiffness.  Skin:  Negative for rash.  Neurological:  Positive for dizziness, weakness and headaches.  Psychiatric/Behavioral:  Negative for agitation.       Objective:    Physical Exam Vitals reviewed.  Constitutional:      General: Amanda Webster is not in acute distress.    Appearance: Amanda Webster is obese. Amanda Webster is not diaphoretic.  HENT:     Head: Normocephalic and atraumatic.     Nose: Nose normal.     Mouth/Throat:     Mouth: Mucous membranes are moist.  Eyes:     General: No scleral icterus.    Extraocular Movements: Extraocular movements intact.  Cardiovascular:     Rate and Rhythm: Normal rate and regular rhythm.     Heart sounds: Normal heart sounds. No murmur heard. Pulmonary:     Breath sounds: Normal breath sounds. No wheezing  or rales.  Musculoskeletal:     Cervical back: Neck supple. No tenderness.     Right lower leg: No edema.     Left lower leg: No edema.  Skin:    General: Skin is warm.     Findings: No rash.  Neurological:     General: No focal deficit present.     Mental Status: Amanda Webster is alert and oriented to person, place, and time.     Sensory: Sensory deficit (b/l LE) present.     Motor: Weakness (RUE - 4/5, b/l LE - 4/5) present.  Psychiatric:        Mood and Affect: Mood normal.        Behavior: Behavior normal.      BP 117/78 (BP Location: Right Arm, Patient Position: Sitting, Cuff Size: Normal)   Pulse 72   Ht  (1.575 m)   Wt 178 lb 3.2 oz (80.8 kg)   SpO2 94%   BMI 32.59 kg/m  Wt Readings from Last 3 Encounters:  12/29/22 178 lb 3.2 oz (80.8 kg)  12/15/22 184 lb (83.5 kg)  12/09/22 184 lb (83.5 kg)    Lab Results  Component Value Date   TSH 0.684 02/03/2022   Lab Results  Component Value Date   WBC 7.0 02/03/2022   HGB 15.1 02/03/2022   HCT 40.8 02/03/2022   MCV 89 02/03/2022   PLT 209 02/03/2022   Lab Results  Component Value Date   NA 138 11/11/2022   K 3.9 11/11/2022   CO2 23 11/11/2022   GLUCOSE 121 (H) 11/11/2022   BUN 8 11/11/2022   CREATININE 0.73 11/11/2022   BILITOT 0.3 11/11/2022   ALKPHOS 133 (H) 11/11/2022   AST 22 11/11/2022   ALT 21 11/11/2022   PROT 6.2 11/11/2022   ALBUMIN 4.5 11/11/2022   CALCIUM 9.3 11/11/2022   ANIONGAP 8 03/25/2021   EGFR 95 11/11/2022   Lab Results  Component Value Date   CHOL 152 02/03/2022   Lab Results  Component Value Date   HDL 47 02/03/2022   Lab Results  Component Value Date   LDLCALC 68 02/03/2022   Lab Results  Component Value Date   TRIG 228 (H) 02/03/2022   Lab Results  Component Value Date   CHOLHDL 3.2 02/03/2022   Lab Results  Component Value Date   HGBA1C 6.1 (H) 11/11/2022      Assessment & Plan:   Problem List Items Addressed This Visit       Cardiovascular and Mediastinum   Essential hypertension    Well-controlled with Amlodipine and Propranolol now Increased dose of Amlodipine to 10 mg QD in the last visit, but due to orthostatic hypotension, advised to decrease dose to half tablet QD for now - needs to improve fluid intake Counseled for compliance with the medications Advised DASH diet and moderate exercise/walking, at least 150 mins/week        Nervous and Auditory   Localization-related epilepsy    Takes Trileptal Followed by Neurology      Hydrocephalus - Primary     Has history of Arnold-Chiari malformation VP shunt for hydrocephalus, but had recent increased size of ventricles noted on CT of head - had reprogramming on 10/25/22 Followed by neurosurgeon Recurrent weakness of the RUE and b/l LE and visual disturbance with gait imbalance, needs CT head stat Advised to contact neurosurgeon      Relevant Orders   CT HEAD WO CONTRAST ( )     Other  S/P VP shunt (Chronic)    Followed by neurosurgery at [facility] Unclear if her current symptoms are due to hydrocephalus Could be recent TIA - on aspirin and statin currently Check CT head stat       No orders of the defined types were placed in this encounter.   Follow-up: Return if symptoms worsen or fail to improve.    Anabel Halon, MD

## 2022-12-29 NOTE — Assessment & Plan Note (Signed)
Well-controlled with Amlodipine and Propranolol now Increased dose of Amlodipine to 10 mg QD in the last visit, but due to orthostatic hypotension, advised to decrease dose to half tablet QD for now - needs to improve fluid intake Counseled for compliance with the medications Advised DASH diet and moderate exercise/walking, at least 150 mins/week

## 2022-12-29 NOTE — Assessment & Plan Note (Signed)
Takes Trileptal Followed by Neurology 

## 2022-12-30 DIAGNOSIS — Z982 Presence of cerebrospinal fluid drainage device: Secondary | ICD-10-CM | POA: Diagnosis not present

## 2022-12-30 DIAGNOSIS — G4733 Obstructive sleep apnea (adult) (pediatric): Secondary | ICD-10-CM | POA: Diagnosis not present

## 2022-12-30 DIAGNOSIS — E119 Type 2 diabetes mellitus without complications: Secondary | ICD-10-CM | POA: Diagnosis not present

## 2022-12-30 DIAGNOSIS — M797 Fibromyalgia: Secondary | ICD-10-CM | POA: Diagnosis not present

## 2022-12-30 DIAGNOSIS — F419 Anxiety disorder, unspecified: Secondary | ICD-10-CM | POA: Diagnosis not present

## 2022-12-30 DIAGNOSIS — T8501XA Breakdown (mechanical) of ventricular intracranial (communicating) shunt, initial encounter: Secondary | ICD-10-CM | POA: Diagnosis not present

## 2022-12-30 DIAGNOSIS — Z981 Arthrodesis status: Secondary | ICD-10-CM | POA: Diagnosis not present

## 2022-12-30 DIAGNOSIS — I10 Essential (primary) hypertension: Secondary | ICD-10-CM | POA: Diagnosis not present

## 2022-12-30 DIAGNOSIS — G910 Communicating hydrocephalus: Secondary | ICD-10-CM | POA: Diagnosis not present

## 2022-12-30 DIAGNOSIS — E785 Hyperlipidemia, unspecified: Secondary | ICD-10-CM | POA: Diagnosis not present

## 2022-12-31 DIAGNOSIS — Z981 Arthrodesis status: Secondary | ICD-10-CM | POA: Diagnosis not present

## 2022-12-31 DIAGNOSIS — G4733 Obstructive sleep apnea (adult) (pediatric): Secondary | ICD-10-CM | POA: Diagnosis not present

## 2022-12-31 DIAGNOSIS — M797 Fibromyalgia: Secondary | ICD-10-CM | POA: Diagnosis not present

## 2022-12-31 DIAGNOSIS — G910 Communicating hydrocephalus: Secondary | ICD-10-CM | POA: Diagnosis not present

## 2022-12-31 DIAGNOSIS — T8509XA Other mechanical complication of ventricular intracranial (communicating) shunt, initial encounter: Secondary | ICD-10-CM | POA: Diagnosis not present

## 2022-12-31 DIAGNOSIS — F419 Anxiety disorder, unspecified: Secondary | ICD-10-CM | POA: Diagnosis not present

## 2022-12-31 DIAGNOSIS — I10 Essential (primary) hypertension: Secondary | ICD-10-CM | POA: Diagnosis not present

## 2022-12-31 DIAGNOSIS — E119 Type 2 diabetes mellitus without complications: Secondary | ICD-10-CM | POA: Diagnosis not present

## 2022-12-31 DIAGNOSIS — E785 Hyperlipidemia, unspecified: Secondary | ICD-10-CM | POA: Diagnosis not present

## 2022-12-31 DIAGNOSIS — T8501XA Breakdown (mechanical) of ventricular intracranial (communicating) shunt, initial encounter: Secondary | ICD-10-CM | POA: Diagnosis not present

## 2023-01-01 DIAGNOSIS — F419 Anxiety disorder, unspecified: Secondary | ICD-10-CM | POA: Diagnosis not present

## 2023-01-01 DIAGNOSIS — E785 Hyperlipidemia, unspecified: Secondary | ICD-10-CM | POA: Diagnosis not present

## 2023-01-01 DIAGNOSIS — M797 Fibromyalgia: Secondary | ICD-10-CM | POA: Diagnosis not present

## 2023-01-01 DIAGNOSIS — Z982 Presence of cerebrospinal fluid drainage device: Secondary | ICD-10-CM | POA: Diagnosis not present

## 2023-01-01 DIAGNOSIS — I10 Essential (primary) hypertension: Secondary | ICD-10-CM | POA: Diagnosis not present

## 2023-01-01 DIAGNOSIS — G4733 Obstructive sleep apnea (adult) (pediatric): Secondary | ICD-10-CM | POA: Diagnosis not present

## 2023-01-01 DIAGNOSIS — G910 Communicating hydrocephalus: Secondary | ICD-10-CM | POA: Diagnosis not present

## 2023-01-01 DIAGNOSIS — Z981 Arthrodesis status: Secondary | ICD-10-CM | POA: Diagnosis not present

## 2023-01-01 DIAGNOSIS — T8501XA Breakdown (mechanical) of ventricular intracranial (communicating) shunt, initial encounter: Secondary | ICD-10-CM | POA: Diagnosis not present

## 2023-01-01 DIAGNOSIS — E119 Type 2 diabetes mellitus without complications: Secondary | ICD-10-CM | POA: Diagnosis not present

## 2023-01-03 ENCOUNTER — Telehealth: Payer: Self-pay | Admitting: *Deleted

## 2023-01-05 NOTE — Transitions of Care (Post Inpatient/ED Visit) (Signed)
   01/03/2023  Name: Amanda Webster MRN: [id number] DOB: 05-Apr-1961  Today's TOC FU Call Status: Today's TOC FU Call Status:: Unsuccessul Call (1st Attempt) Unsuccessful Call (1st Attempt) Date: 01/03/23  Attempted to reach the patient regarding the most recent Inpatient/ED visit.  Follow Up Plan: Additional outreach attempts will be made to reach the patient to complete the Transitions of Care (Post Inpatient/ED visit) call.   Demetrios Loll, BSN, RN-BC RN Care Coordinator [facility]  [facility] Direct Dial: [phone] Main #: [phone]

## 2023-01-06 ENCOUNTER — Telehealth: Payer: Self-pay | Admitting: *Deleted

## 2023-01-06 ENCOUNTER — Encounter: Payer: Self-pay | Admitting: *Deleted

## 2023-01-06 NOTE — Transitions of Care (Post Inpatient/ED Visit) (Signed)
   01/06/2023  Name: Amanda Webster MRN: [id number] DOB: [birth year]  Today's TOC FU Call Status: Today's TOC FU Call Status:: Successful TOC FU Call Competed TOC FU Call Complete Date: 01/06/23  Transition Care Management Follow-up Telephone Call Date of Discharge: 01/01/23 Discharge Facility: Other ([facility]) Name of Other ([facility]) Discharge Facility: [facility] Type of Discharge: Inpatient Admission Primary Inpatient Discharge Diagnosis:: Cervical spondylosis with myelopathy How have you been since you were released from the hospital?: Better Any questions or concerns?: No  Items Reviewed: Did you receive and understand the discharge instructions provided?: Yes Medications obtained and verified?: Yes (Medications Reviewed) Any new allergies since your discharge?: No Dietary orders reviewed?: NA Do you have support at home?: Yes People in Home: spouse Name of Support/Comfort Primary Source: [facility] and Equipment/Supplies: Were Home Health Services Ordered?: No Any new equipment or medical supplies ordered?: No  Functional Questionnaire: Do you need assistance with bathing/showering or dressing?: No Do you need assistance with meal preparation?: No Do you need assistance with eating?: No Do you have difficulty maintaining continence: No Do you need assistance with getting out of bed/getting out of a chair/moving?: No Do you have difficulty managing or taking your medications?: No  Follow up appointments reviewed: PCP Follow-up appointment confirmed?: NA Specialist Hospital Follow-up appointment confirmed?: Yes Date of Specialist follow-up appointment?: 01/14/23 Follow-Up Specialty Provider:: Dr Angelyn Punt (neurosurgeon) Do you need transportation to your follow-up appointment?: No Do you understand care options if your condition(s) worsen?: Yes-patient verbalized understanding  SDOH Interventions Today    Flowsheet Row Most  Recent Value  SDOH Interventions   Transportation Interventions Intervention Not Indicated  Financial Strain Interventions Intervention Not Indicated      TOC Interventions Today    Flowsheet Row Most Recent Value  TOC Interventions   TOC Interventions Discussed/Reviewed TOC Interventions Discussed, TOC Interventions Reviewed, Post discharge activity limitations per provider      Interventions Today    Flowsheet Row Most Recent Value  Chronic Disease   Chronic disease during today's visit Other  [hospitalization for shunt revision for Cervical spondylosis with myelopathy]  General Interventions   General Interventions Discussed/Reviewed Durable Medical Equipment (DME)  Doctor Visits Discussed/Reviewed Doctor Visits Discussed, Doctor Visits Reviewed, PCP, Specialist  [reviewed hospital notes and upcoming provider appts]  Durable Medical Equipment (DME) Other  [cane]  PCP/Specialist Visits Compliance with follow-up visit  Education Interventions   Education Provided Provided Education  Provided Verbal Education On When to see the doctor  Pharmacy Interventions   Pharmacy Dicussed/Reviewed Medications and their functions  Safety Interventions   Safety Discussed/Reviewed Safety Discussed, Home Safety, Fall Risk  Home Safety Assistive Devices      Demetrios Loll, BSN, RN-BC RN Care Coordinator [facility]  [facility] Direct Dial: [phone] Main #: [phone]

## 2023-01-06 NOTE — Progress Notes (Signed)
Back to his neurosurgeon Dr. Brooke Pace visit December 29, 2022, adjustable ventriculoperitoneal shunt, recurrent ventriculomegaly, partial obstruction of the distal shunt tubing, that was replaced,  It is programmed at a setting of 4, (1-8 possible), this could be released if she develop additional headaches, or subdural fluid collection, or lowered if her symptoms does not improve, the device is MRI compatible, however the setting should be checked within 72 hours of MRI,

## 2023-01-12 ENCOUNTER — Ambulatory Visit (INDEPENDENT_AMBULATORY_CARE_PROVIDER_SITE_OTHER): Payer: Medicare PPO

## 2023-01-12 ENCOUNTER — Ambulatory Visit: Payer: Medicare PPO | Admitting: Podiatry

## 2023-01-12 DIAGNOSIS — E119 Type 2 diabetes mellitus without complications: Secondary | ICD-10-CM | POA: Diagnosis not present

## 2023-01-12 DIAGNOSIS — E1142 Type 2 diabetes mellitus with diabetic polyneuropathy: Secondary | ICD-10-CM

## 2023-01-12 DIAGNOSIS — M216X1 Other acquired deformities of right foot: Secondary | ICD-10-CM | POA: Diagnosis not present

## 2023-01-12 DIAGNOSIS — M216X2 Other acquired deformities of left foot: Secondary | ICD-10-CM

## 2023-01-12 NOTE — Progress Notes (Signed)
  Subjective:  Patient ID: Amanda Webster, female    DOB: 03-Dec-1960,  MRN: [id number]  Chief Complaint  Patient presents with   Diabetes    Evaluate for diabetic shoes    62 y.o. female presents with the above complaint. History confirmed with patient.  Overall she is doing well the tendinitis is not bothering her as much.  Her previous diabetic shoes are comfortable.  She is ready for a new pair.  Does have occasional shooting pain in both feet worse on the left recently  Objective:  Physical Exam: warm, good capillary refill, no trophic changes or ulcerative lesions, normal DP and PT pulses, and abnormal sensory exam with loss of protective sensation, she has a pes cavus foot type.  Assessment:   1. Acquired bilateral pes cavus   2. Type 2 diabetes mellitus with other specified complication, without long-term current use of insulin (HCC)   3. Encounter for diabetic foot exam [facility])      Plan:  Patient was evaluated and treated and all questions answered.  Patient educated on diabetes. Discussed proper diabetic foot care and discussed risks and complications of disease. Educated patient in depth on reasons to return to the office immediately should he/she discover anything concerning or new on the feet. All questions answered. Discussed proper shoes as well.     Return in about 1 year (around 01/12/2024) for diabetic foot exam.

## 2023-01-12 NOTE — Progress Notes (Signed)
Patient presents to the office today for diabetic shoe and insole measuring.  ABN signed.   Documentation of medical necessity will be sent to patient's treating diabetic doctor to verify and sign.   Patient's diabetic provider: Anabel Halon, MD   Shoes and insoles will be ordered at that time and patient will be notified for an appointment for fitting when they arrive.   Patient shoe selection-   1st   Shoe choice:   [id number]  Shoe size ordered: 7 W

## 2023-01-14 DIAGNOSIS — Z982 Presence of cerebrospinal fluid drainage device: Secondary | ICD-10-CM | POA: Diagnosis not present

## 2023-01-14 DIAGNOSIS — G918 Other hydrocephalus: Secondary | ICD-10-CM | POA: Diagnosis not present

## 2023-01-27 DIAGNOSIS — Z981 Arthrodesis status: Secondary | ICD-10-CM | POA: Diagnosis not present

## 2023-01-27 DIAGNOSIS — G918 Other hydrocephalus: Secondary | ICD-10-CM | POA: Diagnosis not present

## 2023-01-27 DIAGNOSIS — Z982 Presence of cerebrospinal fluid drainage device: Secondary | ICD-10-CM | POA: Diagnosis not present

## 2023-02-15 ENCOUNTER — Ambulatory Visit (INDEPENDENT_AMBULATORY_CARE_PROVIDER_SITE_OTHER): Payer: Medicare PPO | Admitting: Internal Medicine

## 2023-02-15 ENCOUNTER — Encounter: Payer: Self-pay | Admitting: Internal Medicine

## 2023-02-15 VITALS — BP 107/71 | HR 69 | Ht 62.0 in | Wt 170.2 lb

## 2023-02-15 DIAGNOSIS — E1142 Type 2 diabetes mellitus with diabetic polyneuropathy: Secondary | ICD-10-CM

## 2023-02-15 DIAGNOSIS — H66001 Acute suppurative otitis media without spontaneous rupture of ear drum, right ear: Secondary | ICD-10-CM

## 2023-02-15 DIAGNOSIS — E785 Hyperlipidemia, unspecified: Secondary | ICD-10-CM | POA: Diagnosis not present

## 2023-02-15 DIAGNOSIS — Z0001 Encounter for general adult medical examination with abnormal findings: Secondary | ICD-10-CM | POA: Diagnosis not present

## 2023-02-15 DIAGNOSIS — R5381 Other malaise: Secondary | ICD-10-CM | POA: Insufficient documentation

## 2023-02-15 DIAGNOSIS — G919 Hydrocephalus, unspecified: Secondary | ICD-10-CM

## 2023-02-15 DIAGNOSIS — E559 Vitamin D deficiency, unspecified: Secondary | ICD-10-CM

## 2023-02-15 DIAGNOSIS — E1169 Type 2 diabetes mellitus with other specified complication: Secondary | ICD-10-CM | POA: Diagnosis not present

## 2023-02-15 DIAGNOSIS — I10 Essential (primary) hypertension: Secondary | ICD-10-CM

## 2023-02-15 DIAGNOSIS — G40109 Localization-related (focal) (partial) symptomatic epilepsy and epileptic syndromes with simple partial seizures, not intractable, without status epilepticus: Secondary | ICD-10-CM

## 2023-02-15 DIAGNOSIS — G43719 Chronic migraine without aura, intractable, without status migrainosus: Secondary | ICD-10-CM

## 2023-02-15 DIAGNOSIS — Z114 Encounter for screening for human immunodeficiency virus [HIV]: Secondary | ICD-10-CM | POA: Diagnosis not present

## 2023-02-15 MED ORDER — LANCET DEVICE MISC: 1.0000 | Freq: Three times a day (TID) | 0 refills | Status: AC

## 2023-02-15 MED ORDER — LANCET DEVICE MISC: 1.0000 | Freq: Three times a day (TID) | 0 refills | Status: DC

## 2023-02-15 MED ORDER — LANCETS MISC. MISC: 1.0000 | Freq: Three times a day (TID) | 0 refills | Status: DC

## 2023-02-15 MED ORDER — BLOOD GLUCOSE TEST VI STRP: 1.0000 | ORAL_STRIP | Freq: Three times a day (TID) | 0 refills | Status: DC

## 2023-02-15 MED ORDER — AMLODIPINE BESYLATE 5 MG PO TABS: 5.0000 mg | ORAL_TABLET | Freq: Every day | ORAL | 1 refills | Status: DC

## 2023-02-15 MED ORDER — GLIPIZIDE ER 5 MG PO TB24: 5.0000 mg | ORAL_TABLET | Freq: Every day | ORAL | 3 refills | Status: DC

## 2023-02-15 MED ORDER — OFLOXACIN 0.3 % OT SOLN: 5.0000 [drp] | Freq: Every day | OTIC | 0 refills | Status: DC

## 2023-02-15 MED ORDER — BLOOD GLUCOSE MONITORING SUPPL DEVI: 1.0000 | Freq: Three times a day (TID) | 0 refills | Status: DC

## 2023-02-15 MED ORDER — BLOOD GLUCOSE TEST VI STRP: 1.0000 | ORAL_STRIP | Freq: Three times a day (TID) | 0 refills | Status: AC

## 2023-02-15 MED ORDER — LANCETS MISC. MISC: 1.0000 | Freq: Three times a day (TID) | 0 refills | Status: AC

## 2023-02-15 NOTE — Assessment & Plan Note (Addendum)
Well-controlled with Amlodipine 5 mg QD and Propranolol now Counseled for compliance with the medications Advised DASH diet and moderate exercise/walking, at least 150 mins/week

## 2023-02-15 NOTE — Progress Notes (Signed)
Established Patient Office Visit  Subjective:  Patient ID: Amanda Webster, female    DOB: 12-06-1960  Age: 62 y.o. MRN: [id number]  CC:  Chief Complaint  Patient presents with   Annual Exam    Patient states her right ear feels swollen and has been bleeding. Discuss changing ozempic    HPI [name] is a 62 y.o. female with past medical history of HTN, type II DM with HLD, polyarthritis, migraine, seizure disorder, allergic rhinitis and morbid obesity who presents for annual physical.  Hydrocephalus: She had revision of VP shunt in 04/24 due to recurrence of hydrocephalus.  She is still reports fatigue and weakness.  Her vision has improved now. She has history of hydrocephalus s/p VP shunt placement and recent change in settings (12/23).  She had EEG in 01/24, which was unremarkable.  She had NCS/EMG by Dr Terrace Arabia, which showed mild axonal sensorimotor neuropathy.  She is already on gabapentin and amitriptyline. She has felt progressive leg weakness lately as well.  Type II DM: She takes Ozempic 1 mg qw.  She has been able to lose weight, but has poor appetite and nausea every day.  She is not able to tolerate food due to it.  She has been feeling fatigued due to poor p.o. intake.  She has tried metformin, but had GI discomfort with it as well.  Denies any polyuria or polyphagia currently.  HTN: Her BP is well-controlled.  She takes amlodipine 5 mg QD and propranolol 80 mg (mainly for migraine).  She denies any dizziness, chest pain, dyspnea or palpitations currently.  She complains of right ear fullness and pain for the last 2 weeks and a bump inside her right ear, which she tried to remove with a Q-tip and noticed an episode of bloody discharge from the right ear.  She denies any fever or chills currently.  She has mild hearing difficulty on the right side as well.    Past Medical History:  Diagnosis Date   Allergy    Anxiety    Arnold-Chiari malformation (HCC)    Arthritis     Borderline diabetic    Chronic headaches    Colon polyps    nonadenomatous   Depression    Diabetes mellitus without complication (HCC)    Diverticulosis    Esophageal stricture    FROZEN LEFT SHOULDER 04/01/2010   Qualifier: Diagnosis of  By: Romeo Apple MD, Duffy Rhody     GERD (gastroesophageal reflux disease)    HTN (hypertension)    Hyperlipidemia    IBS (irritable bowel syndrome)    Seizures (HCC)    pt not sure when her last seizure was- she says she just spaces out- no shaking but she could not tell me last seizure-  on Oxtellar bid for seizures    Sleep apnea    no cpap now- did use 02 with cpap but has not used either in "years"    Past Surgical History:  Procedure Laterality Date   BREAST BIOPSY Left    cahri decompression  8 and 03/1999   CHOLECYSTECTOMY     COLONOSCOPY     invasive cervical traction  02/2009   left breast-lumpectomy     POLYPECTOMY     shunt put in on right side of brain     SHUNT REPLACEMENT  12/10   TOTAL ABDOMINAL HYSTERECTOMY  Age 48 yrs   TAH & BSO   TUBAL LIGATION     UPPER GASTROINTESTINAL ENDOSCOPY  Family History  Problem Relation Age of Onset   Colon polyps Mother    Breast cancer Mother    Lung cancer Mother    Heart disease Father    Stroke Father    Aortic aneurysm Father    Chiari malformation Daughter    Addison's disease Son    Breast cancer Maternal Aunt    Colon cancer Neg Hx    Esophageal cancer Neg Hx    Rectal cancer Neg Hx    Stomach cancer Neg Hx     Social History   Socioeconomic History   Marital status: Married    Spouse name: Not on file   Number of children: 3   Years of education: Not on file   Highest education level: Not on file  Occupational History   Not on file  Tobacco Use   Smoking status: Former    Packs/day: 1.50    Years: 30.00    Additional pack years: 0.00    Total pack years: 45.00    Types: Cigarettes    Quit date: 09/13/1990    Years since quitting: 32.4    Passive exposure:  Never   Smokeless tobacco: Never  Vaping Use   Vaping Use: Never used  Substance and Sexual Activity   Alcohol use: No   Drug use: No   Sexual activity: Not Currently    Birth control/protection: Surgical    Comment: hyst  Other Topics Concern   Not on file  Social History Narrative   Disabled from Chiari malformation/seizures.. Drinks 4-5 cups of tea daily. Does not get regular exercise. Married for 40 years.   Social Determinants of Health   Financial Resource Strain: Low Risk  (01/06/2023)   Overall Financial Resource Strain (CARDIA)    Difficulty of Paying Living Expenses: Not hard at all  Food Insecurity: No Food Insecurity (12/09/2022)   Hunger Vital Sign    Worried About Running Out of Food in the Last Year: Never true    Ran Out of Food in the Last Year: Never true  Transportation Needs: No Transportation Needs (01/06/2023)   PRAPARE - Administrator, Civil Service (Medical): No    Lack of Transportation (Non-Medical): No  Physical Activity: Insufficiently Active (12/09/2022)   Exercise Vital Sign    Days of Exercise per Week: 1 day    Minutes of Exercise per Session: 60 min  Stress: Stress Concern Present (12/09/2022)   [employer] of Occupational Health - Occupational Stress Questionnaire    Feeling of Stress : Rather much  Social Connections: Moderately Integrated (12/09/2022)   Social Connection and Isolation Panel [NHANES]    Frequency of Communication with Friends and Family: More than three times a week    Frequency of Social Gatherings with Friends and Family: More than three times a week    Attends Religious Services: More than 4 times per year    Active Member of [employer] or Organizations: No    Attends Banker Meetings: Never    Marital Status: Married  Catering manager Violence: Not At Risk (12/09/2022)   Humiliation, Afraid, Rape, and Kick questionnaire    Fear of Current or Ex-Partner: No    Emotionally Abused: No    Physically  Abused: No    Sexually Abused: No    Outpatient Medications Prior to Visit  Medication Sig Dispense Refill   Accu-Chek Softclix Lancets lancets 1 each by Other route daily. as directed 100 each 3   AIMOVIG 140  MG/ML SOAJ Inject 140 mg into the skin daily.     aspirin EC 81 MG tablet Take 1 tablet by mouth daily.     atorvastatin (LIPITOR) 20 MG tablet TAKE 1 TABLET EVERY DAY 90 tablet 10   cetirizine (ZYRTEC) 10 MG tablet TAKE 1 TABLET EVERY DAY 90 tablet 0   famotidine (PEPCID) 20 MG tablet Take 1 tablet (20 mg total) by mouth daily. 30 tablet 3   fluticasone (FLONASE) 50 MCG/ACT nasal spray Place 1 spray into both nostrils daily. 16 g 2   gabapentin (NEURONTIN) 300 MG capsule Take 600 mg by mouth at bedtime.     glucose blood (ACCU-CHEK AVIVA PLUS) test strip 1 each by Other route daily. Use as instructed 100 strip 3   naproxen sodium (ALEVE) 220 MG tablet Take 440 mg by mouth daily as needed (for pain).     Omega-3 Fatty Acids (FISH OIL) 1000 MG CAPS Take 2 capsules by mouth daily.     ondansetron (ZOFRAN-ODT) 4 MG disintegrating tablet Take by mouth.     Oxcarbazepine (TRILEPTAL) 300 MG tablet Take 300 mg by mouth 2 (two) times daily.     polyethylene glycol powder (GLYCOLAX/MIRALAX) 17 GM/SCOOP powder Take 17 g by mouth 2 (two) times daily as needed. 3350 g 1   propranolol (INDERAL) 60 MG tablet Take 1 tablet (60 mg total) by mouth every morning. (Patient taking differently: Take 80 mg by mouth every morning.) 90 tablet 3   sennosides-docusate sodium (SENOKOT-S) 8.6-50 MG tablet Take 1 tablet by mouth daily. 30 tablet 2   SUMAtriptan (IMITREX) 50 MG tablet May repeat in 2 hours if headache persists or recurs. 12 tablet 11   UNABLE TO FIND Aleve pain relieving lotion-daily     VITAMIN D PO Take 10,000 Units by mouth daily.     amLODipine (NORVASC) 10 MG tablet TAKE 1 TABLET(10 MG) BY MOUTH DAILY 90 tablet 1   Semaglutide, 1 MG/DOSE, (OZEMPIC, 1 MG/DOSE,) 4 MG/3ML SOPN INJECT 1MG  UNDER  THE SKIN ONE TIME WEEKLY 9 mL 10   No facility-administered medications prior to visit.    Allergies  Allergen Reactions   Cymbalta [Duloxetine Hcl] Itching    Nasal itching   Methocarbamol    Propoxyphene N-Acetaminophen Itching   Sulfa Antibiotics     ROS Review of Systems  Constitutional:  Positive for fatigue. Negative for chills and fever.  HENT:  Positive for ear pain. Negative for congestion, sinus pressure and sinus pain.   Eyes:  Negative for pain and discharge.  Respiratory:  Negative for cough and shortness of breath.   Cardiovascular:  Negative for chest pain and palpitations.  Gastrointestinal:  Positive for constipation and nausea. Negative for diarrhea and vomiting.  Genitourinary:  Negative for frequency and hematuria.  Musculoskeletal:  Positive for arthralgias, back pain and gait problem. Negative for neck pain and neck stiffness.  Skin:  Negative for rash.  Neurological:  Positive for dizziness, weakness and headaches.  Psychiatric/Behavioral:  Negative for agitation.       Objective:    Physical Exam Vitals reviewed.  Constitutional:      General: She is not in acute distress.    Appearance: She is obese. She is not diaphoretic.  HENT:     Head: Normocephalic and atraumatic.     Right Ear: Tenderness present. A middle ear effusion is present.     Nose: Nose normal.     Mouth/Throat:     Mouth: Mucous membranes are moist.  Eyes:     General: No scleral icterus.    Extraocular Movements: Extraocular movements intact.  Cardiovascular:     Rate and Rhythm: Normal rate and regular rhythm.     Heart sounds: Normal heart sounds. No murmur heard. Pulmonary:     Breath sounds: Normal breath sounds. No wheezing or rales.  Abdominal:     Palpations: Abdomen is soft.     Tenderness: There is no abdominal tenderness.  Musculoskeletal:     Cervical back: Neck supple. No tenderness.     Right lower leg: No edema.     Left lower leg: No edema.  Skin:     General: Skin is warm.     Findings: No rash.  Neurological:     General: No focal deficit present.     Mental Status: She is alert and oriented to person, place, and time.     Sensory: Sensory deficit (b/l LE) present.     Motor: Weakness (RUE - 4/5, b/l LE - 4/5) present.  Psychiatric:        Mood and Affect: Mood normal.        Behavior: Behavior normal.     BP 107/71 (BP Location: Right Arm, Patient Position: Sitting, Cuff Size: Normal)   Pulse 69   Ht 5\' 2"  (1.575 m)   Wt 170 lb 3.2 oz (77.2 kg)   SpO2 94%   BMI 31.13 kg/m  Wt Readings from Last 3 Encounters:  02/15/23 170 lb 3.2 oz (77.2 kg)  12/29/22 178 lb 3.2 oz (80.8 kg)  12/15/22 184 lb (83.5 kg)    Lab Results  Component Value Date   TSH 0.684 02/03/2022   Lab Results  Component Value Date   WBC 7.0 02/03/2022   HGB 15.1 02/03/2022   HCT 40.8 02/03/2022   MCV 89 02/03/2022   PLT 209 02/03/2022   Lab Results  Component Value Date   NA 138 11/11/2022   K 3.9 11/11/2022   CO2 23 11/11/2022   GLUCOSE 121 (H) 11/11/2022   BUN 8 11/11/2022   CREATININE 0.73 11/11/2022   BILITOT 0.3 11/11/2022   ALKPHOS 133 (H) 11/11/2022   AST 22 11/11/2022   ALT 21 11/11/2022   PROT 6.2 11/11/2022   ALBUMIN 4.5 11/11/2022   CALCIUM 9.3 11/11/2022   ANIONGAP 8 03/25/2021   EGFR 95 11/11/2022   Lab Results  Component Value Date   CHOL 152 02/03/2022   Lab Results  Component Value Date   HDL 47 02/03/2022   Lab Results  Component Value Date   LDLCALC 68 02/03/2022   Lab Results  Component Value Date   TRIG 228 (H) 02/03/2022   Lab Results  Component Value Date   CHOLHDL 3.2 02/03/2022   Lab Results  Component Value Date   HGBA1C 6.1 (H) 11/11/2022      Assessment & Plan:   Problem List Items Addressed This Visit       Cardiovascular and Mediastinum   Essential hypertension    Well-controlled with Amlodipine 5 mg QD and Propranolol now Counseled for compliance with the medications Advised  DASH diet and moderate exercise/walking, at least 150 mins/week      Relevant Medications   amLODipine (NORVASC) 5 MG tablet   Other Relevant Orders   TSH   CMP14+EGFR   CBC with Differential/Platelet   Migraine    On Aimovig and propranolol for ppx Followed by neurology - Dr. Terrace Arabia      Relevant Medications  amLODipine (NORVASC) 5 MG tablet     Endocrine   DM type 2 with diabetic peripheral neuropathy (HCC)    Lab Results  Component Value Date   HGBA1C 6.1 (H) 11/11/2022  Associated with HTN and HLD Well-controlled with Ozempic 1 mg qw -has persistent nausea and poor appetite, discontinue Ozempic and switch to glipizide Has chronic fatigue, likely due to poor appetite-needs to have a small, frequent meals and maintain adequate hydration Advised to follow diabetic diet On statin F/u CMP and lipid panel Diabetic eye exam: Advised to follow up with Ophthalmology for diabetic eye exam  On gabapentin and Elavil for neuropathy      Relevant Medications   glipiZIDE (GLUCOTROL XL) 5 MG 24 hr tablet   Other Relevant Orders   TSH   Hemoglobin A1c   CMP14+EGFR   CBC with Differential/Platelet   Urine Microalbumin w/creat. ratio   Hyperlipidemia associated with type 2 diabetes mellitus (HCC)    On statin Check lipid profile      Relevant Medications   glipiZIDE (GLUCOTROL XL) 5 MG 24 hr tablet   amLODipine (NORVASC) 5 MG tablet   Other Relevant Orders   Lipid panel     Nervous and Auditory   Localization-related epilepsy (HCC)    Takes Trileptal Followed by Neurology      Hydrocephalus [facility])    Has history of Arnold-Chiari malformation VP shunt for hydrocephalus, but had recent increased size of ventricles noted on CT of head - had reprogramming on 10/25/22, but had revision shunt placement in 04/24 as she has recurrent hydrocephalus Followed by neurosurgeon      Non-recurrent acute suppurative otitis media of right ear without spontaneous rupture of tympanic  membrane    Right ear erythema and tenderness Started ofloxacin otic drops      Relevant Medications   ofloxacin (FLOXIN) 0.3 % OTIC solution     Other   Encounter for general adult medical examination with abnormal findings - Primary   Physical deconditioning    Has chronic leg weakness and gait disturbance, could be related to peripheral neuropathy and history of hydrocephalus Has progressive leg weakness, referred to home health for home PT      Relevant Orders   Ambulatory referral to Home Health   TSH   CMP14+EGFR   CBC with Differential/Platelet   Other Visit Diagnoses     Screening for HIV (human immunodeficiency virus)       Relevant Orders   HIV antibody (with reflex)   Vitamin D deficiency       Relevant Orders   VITAMIN D 25 Hydroxy (Vit-D Deficiency, Fractures)       Meds ordered this encounter  Medications   glipiZIDE (GLUCOTROL XL) 5 MG 24 hr tablet    Sig: Take 1 tablet (5 mg total) by mouth daily with breakfast.    Dispense:  30 tablet    Refill:  3   amLODipine (NORVASC) 5 MG tablet    Sig: Take 1 tablet (5 mg total) by mouth daily.    Dispense:  90 tablet    Refill:  1   ofloxacin (FLOXIN) 0.3 % OTIC solution    Sig: Place 5 drops into the right ear daily.    Dispense:  5 mL    Refill:  0   DISCONTD: Blood Glucose Monitoring Suppl DEVI    Sig: 1 each by Does not apply route in the morning, at noon, and at bedtime. May substitute to any manufacturer covered by  patient's insurance.    Dispense:  1 each    Refill:  0   DISCONTD: Glucose Blood (BLOOD GLUCOSE TEST STRIPS) STRP    Sig: 1 each by In Vitro route in the morning, at noon, and at bedtime. May substitute to any manufacturer covered by patient's insurance.    Dispense:  100 strip    Refill:  0   DISCONTD: Lancet Device MISC    Sig: 1 each by Does not apply route in the morning, at noon, and at bedtime. May substitute to any manufacturer covered by patient's insurance.    Dispense:  1 each     Refill:  0   DISCONTD: Lancets Misc. MISC    Sig: 1 each by Does not apply route in the morning, at noon, and at bedtime. May substitute to any manufacturer covered by patient's insurance.    Dispense:  100 each    Refill:  0   Blood Glucose Monitoring Suppl DEVI    Sig: 1 each by Does not apply route in the morning, at noon, and at bedtime. May substitute to any manufacturer covered by patient's insurance.    Dispense:  1 each    Refill:  0   Glucose Blood (BLOOD GLUCOSE TEST STRIPS) STRP    Sig: 1 each by In Vitro route in the morning, at noon, and at bedtime. May substitute to any manufacturer covered by patient's insurance.    Dispense:  100 strip    Refill:  0   Lancet Device MISC    Sig: 1 each by Does not apply route in the morning, at noon, and at bedtime. May substitute to any manufacturer covered by patient's insurance.    Dispense:  1 each    Refill:  0   Lancets Misc. MISC    Sig: 1 each by Does not apply route in the morning, at noon, and at bedtime. May substitute to any manufacturer covered by patient's insurance.    Dispense:  100 each    Refill:  0    Follow-up: Return in about 3 months (around 05/18/2023) for DM and HTN.    Anabel Halon, MD

## 2023-02-15 NOTE — Assessment & Plan Note (Signed)
Right ear erythema and tenderness Started ofloxacin otic drops

## 2023-02-15 NOTE — Assessment & Plan Note (Signed)
Has chronic leg weakness and gait disturbance, could be related to peripheral neuropathy and history of hydrocephalus Has progressive leg weakness, referred to home health for home PT

## 2023-02-15 NOTE — Assessment & Plan Note (Signed)
On Aimovig and propranolol for ppx Followed by neurology - Dr. Yan 

## 2023-02-15 NOTE — Patient Instructions (Addendum)
Please use Ofloxacin ear drops for 1 week as prescribed.  Please avoid using any sharp objects for cleaning purposes.  Please stop taking Ozempic and start taking Glipizide as prescribed.  Please continue to take other medications as prescribed.  Please continue to follow low carb diet and perform moderate exercise/walking at least 150 mins/week.

## 2023-02-15 NOTE — Assessment & Plan Note (Signed)
Takes Trileptal Followed by Neurology 

## 2023-02-15 NOTE — Assessment & Plan Note (Signed)
Has history of Arnold-Chiari malformation VP shunt for hydrocephalus, but had recent increased size of ventricles noted on CT of head - had reprogramming on 10/25/22, but had revision shunt placement in 04/24 as she has recurrent hydrocephalus Followed by neurosurgeon

## 2023-02-15 NOTE — Assessment & Plan Note (Addendum)
Lab Results  Component Value Date   HGBA1C 6.1 (H) 11/11/2022   Associated with HTN and HLD Well-controlled with Ozempic 1 mg qw -has persistent nausea and poor appetite, discontinue Ozempic and switch to glipizide Has chronic fatigue, likely due to poor appetite-needs to have a small, frequent meals and maintain adequate hydration Advised to follow diabetic diet On statin F/u CMP and lipid panel Diabetic eye exam: Advised to follow up with Ophthalmology for diabetic eye exam  On gabapentin and Elavil for neuropathy

## 2023-02-15 NOTE — Assessment & Plan Note (Signed)
On statin Check lipid profile 

## 2023-02-17 LAB — CMP14+EGFR
ALT: 16 IU/L (ref 0–32)
AST: 16 IU/L (ref 0–40)
Albumin/Globulin Ratio: 2.1 (ref 1.2–2.2)
Albumin: 4.6 g/dL (ref 3.8–4.9)
Alkaline Phosphatase: 135 IU/L — ABNORMAL HIGH (ref 44–121)
BUN/Creatinine Ratio: 6 — ABNORMAL LOW (ref 12–28)
BUN: 7 mg/dL — ABNORMAL LOW (ref 8–27)
Bilirubin Total: 0.3 mg/dL (ref 0.0–1.2)
CO2: 27 mmol/L (ref 20–29)
Calcium: 10 mg/dL (ref 8.7–10.3)
Chloride: 103 mmol/L (ref 96–106)
Creatinine, Ser: 1.21 mg/dL — ABNORMAL HIGH (ref 0.57–1.00)
Globulin, Total: 2.2 g/dL (ref 1.5–4.5)
Glucose: 144 mg/dL — ABNORMAL HIGH (ref 70–99)
Potassium: 3.5 mmol/L (ref 3.5–5.2)
Sodium: 146 mmol/L — ABNORMAL HIGH (ref 134–144)
Total Protein: 6.8 g/dL (ref 6.0–8.5)
eGFR: 51 mL/min/{1.73_m2} — ABNORMAL LOW (ref >59)

## 2023-02-17 LAB — HIV ANTIBODY (ROUTINE TESTING W REFLEX): HIV Screen 4th Generation wRfx: NONREACTIVE

## 2023-02-17 LAB — LIPID PANEL
Chol/HDL Ratio: 3.2 ratio (ref 0.0–4.4)
Cholesterol, Total: 179 mg/dL (ref 100–199)
HDL: 56 mg/dL (ref >39)
LDL Chol Calc (NIH): 97 mg/dL (ref 0–99)
Triglycerides: 147 mg/dL (ref 0–149)
VLDL Cholesterol Cal: 26 mg/dL (ref 5–40)

## 2023-02-17 LAB — TSH: TSH: 0.634 u[IU]/mL (ref 0.450–4.500)

## 2023-02-17 LAB — MICROALBUMIN / CREATININE URINE RATIO
Creatinine, Urine: 573.8 mg/dL
Microalb/Creat Ratio: 117 mg/g creat — ABNORMAL HIGH (ref 0–29)
Microalbumin, Urine: 670.3 ug/mL

## 2023-02-17 LAB — CBC WITH DIFFERENTIAL/PLATELET
Basophils Absolute: 0 10*3/uL (ref 0.0–0.2)
Basos: 0 %
EOS (ABSOLUTE): 0.1 10*3/uL (ref 0.0–0.4)
Eos: 1 %
Hematocrit: 43.3 % (ref 34.0–46.6)
Hemoglobin: 14.7 g/dL (ref 11.1–15.9)
Immature Grans (Abs): 0 10*3/uL (ref 0.0–0.1)
Immature Granulocytes: 0 %
Lymphocytes Absolute: 1.7 10*3/uL (ref 0.7–3.1)
Lymphs: 22 %
MCH: 30.5 pg (ref 26.6–33.0)
MCHC: 33.9 g/dL (ref 31.5–35.7)
MCV: 90 fL (ref 79–97)
Monocytes Absolute: 0.6 10*3/uL (ref 0.1–0.9)
Monocytes: 7 %
Neutrophils Absolute: 5.5 10*3/uL (ref 1.4–7.0)
Neutrophils: 70 %
Platelets: 229 10*3/uL (ref 150–450)
RBC: 4.82 x10E6/uL (ref 3.77–5.28)
RDW: 13 % (ref 11.7–15.4)
WBC: 7.9 10*3/uL (ref 3.4–10.8)

## 2023-02-17 LAB — HEMOGLOBIN A1C
Est. average glucose Bld gHb Est-mCnc: 120 mg/dL
Hgb A1c MFr Bld: 5.8 % — ABNORMAL HIGH (ref 4.8–5.6)

## 2023-02-17 LAB — VITAMIN D 25 HYDROXY (VIT D DEFICIENCY, FRACTURES): Vit D, 25-Hydroxy: 64.6 ng/mL (ref 30.0–100.0)

## 2023-02-21 ENCOUNTER — Other Ambulatory Visit: Payer: Self-pay | Admitting: Cardiology

## 2023-02-22 ENCOUNTER — Telehealth: Payer: Self-pay | Admitting: Cardiology

## 2023-02-22 MED ORDER — PROPRANOLOL HCL 60 MG PO TABS: 60.0000 mg | ORAL_TABLET | Freq: Every morning | ORAL | 0 refills | Status: DC

## 2023-02-22 NOTE — Telephone Encounter (Signed)
*  STAT* If patient is at the pharmacy, call can be transferred to refill team.   1. Which medications need to be refilled? (please list name of each medication and dose if known) propranolol (INDERAL) 60 MG tablet propranolol (INDERAL) 80 MG tablet  2. Which pharmacy/location (including street and city if local pharmacy) is medication to be sent to? [facility] - [place], [place] - [address]  3. Do they need a 30 day or 90 day supply?  90 day supply

## 2023-02-22 NOTE — Telephone Encounter (Signed)
Advised that she needed a follow up appointment and also needed to clarify propranolol dose and directions. Reports taking propranolol 60 mg in the morning and 80 mg in the evening. Scheduled visit to see Philis Nettle 02/24/2023 @2 :00 pm.

## 2023-02-24 ENCOUNTER — Ambulatory Visit: Payer: Medicare PPO | Attending: Nurse Practitioner | Admitting: Nurse Practitioner

## 2023-02-24 ENCOUNTER — Encounter: Payer: Self-pay | Admitting: Nurse Practitioner

## 2023-02-24 VITALS — BP 128/82 | HR 64 | Ht 62.0 in | Wt 173.0 lb

## 2023-02-24 DIAGNOSIS — F439 Reaction to severe stress, unspecified: Secondary | ICD-10-CM | POA: Diagnosis not present

## 2023-02-24 DIAGNOSIS — R911 Solitary pulmonary nodule: Secondary | ICD-10-CM

## 2023-02-24 DIAGNOSIS — R002 Palpitations: Secondary | ICD-10-CM | POA: Diagnosis not present

## 2023-02-24 DIAGNOSIS — I2510 Atherosclerotic heart disease of native coronary artery without angina pectoris: Secondary | ICD-10-CM

## 2023-02-24 MED ORDER — PROPRANOLOL HCL 60 MG PO TABS: 60.0000 mg | ORAL_TABLET | Freq: Every morning | ORAL | 3 refills | Status: DC

## 2023-02-24 NOTE — Patient Instructions (Addendum)
Medication Instructions:  Your physician recommends that you continue on your current medications as directed. Please refer to the Current Medication list given to you today.   Labwork: none  Testing/Procedures: none  Follow-Up: Your physician recommends that you schedule a follow-up appointment in: 6 Months with Dr.Branch  Any Other Special Instructions Will Be Listed Below (If Applicable). You have been referred to [facility] [place]    If you need a refill on your cardiac medications before your next appointment, please call your pharmacy.

## 2023-02-24 NOTE — Progress Notes (Signed)
Office Visit    Patient Name: Amanda Webster Date of Encounter: 02/24/2023  PCP:  Anabel Halon, MD   [facility]  Cardiologist:  Dina Rich, MD  Advanced Practice Provider:  No care team member to display Electrophysiologist:  None   Chief Complaint    Amanda Webster is a 62 y.o. female with a hx of CAD, chest pain, HTN, T2DM, palpitations, OSA (noncompliant with CPAP), pulmonary nodule, hydrocephalus, s/p VP shunt and leg edema, who presents today for 1 year follow-up.    Past Medical History    Past Medical History:  Diagnosis Date   Allergy    Anxiety    Arnold-Chiari malformation (HCC)    Arthritis    Borderline diabetic    Chronic headaches    Colon polyps    nonadenomatous   Depression    Diabetes mellitus without complication (HCC)    Diverticulosis    Esophageal stricture    FROZEN LEFT SHOULDER 04/01/2010   Qualifier: Diagnosis of  By: Romeo Apple MD, Duffy Rhody     GERD (gastroesophageal reflux disease)    HTN (hypertension)    Hyperlipidemia    IBS (irritable bowel syndrome)    Seizures (HCC)    pt not sure when her last seizure was- she says she just spaces out- no shaking but she could not tell me last seizure-  on Oxtellar bid for seizures    Sleep apnea    no cpap now- did use 02 with cpap but has not used either in "years"   Past Surgical History:  Procedure Laterality Date   BREAST BIOPSY Left    cahri decompression  8 and 03/1999   CHOLECYSTECTOMY     COLONOSCOPY     invasive cervical traction  02/2009   left breast-lumpectomy     POLYPECTOMY     shunt put in on right side of brain     SHUNT REPLACEMENT  12/10   TOTAL ABDOMINAL HYSTERECTOMY  Age 49 yrs   TAH & BSO   TUBAL LIGATION     UPPER GASTROINTESTINAL ENDOSCOPY      Allergies  Allergies  Allergen Reactions   Cymbalta [Duloxetine Hcl] Itching    Nasal itching   Methocarbamol    Propoxyphene N-Acetaminophen Itching   Sulfa Antibiotics      History of Present Illness    Amanda Webster is a 61 y.o. female with a PMH as mentioned above.   Normal NST in 2017. Event monitor in 2022 showed rare ectopy, isolated run of 4 beat SVT. Echocardiogram in 2023 was overall unremarkable, mild MR.   Last seen by Dr. Dina Rich on June 09, 2022. She did note some CP, CCTA was planned - revealed mild nonobstructive CAD, 5 mm left upper lobe pulmonary nodule noted.   Today she presents for scheduled follow-up. She states she had surgery for her shunt in December and went back to have surgery for her shunt again April 2024. She describes symptoms that she experiences when her hydrocephalus gets worse, has some staggering symptoms with walking, admits to some falls, denies any acute injuries. Admits to stress with this. Doing well from a cardiac perspective. Denies any chest pain, shortness of breath, palpitations, syncope, presyncope, dizziness, orthopnea, PND, swelling or significant weight changes, acute bleeding, or claudication.  EKGs/Labs/Other Studies Reviewed:   The following studies were reviewed today:   EKG:  EKG is not ordered today.     CCTA 06/2022:  IMPRESSION: 1.  Coronary artery calcium score 3.36 Agatston units. This places the patient in the 69th percentile for age and gender, suggesting intermediate risk for future cardiac events.   2.  Mild nonobstructive CAD.  1. There is a 5 mm left upper lobe pulmonary nodule. No follow-up needed if patient is low-risk.This recommendation follows the consensus statement: Guidelines for Management of Incidental Pulmonary Nodules Detected on CT Images: From the Fleischner Society 2017; Radiology 2017; 284:228-243. 2. Additional mild patchy ground-glass opacities within the right perihilar region and within the superior lingula. This is compatible with mild subsegmental atelectasis versus mild/early pneumonitis.  Echo 01/2022:  1. Left ventricular ejection fraction,  by estimation, is 60 to 65%. The  left ventricle has normal function. The left ventricle has no regional  wall motion abnormalities. Left ventricular diastolic parameters were  normal.   2. Right ventricular systolic function is normal. The right ventricular  size is normal. There is normal pulmonary artery systolic pressure. The  estimated right ventricular systolic pressure is 26.0 mmHg.   3. The mitral valve is grossly normal. Mild mitral valve regurgitation.   4. The aortic valve is tricuspid. There is mild calcification of the  aortic valve. Aortic valve regurgitation is not visualized. Aortic valve  sclerosis is present, with no evidence of aortic valve stenosis. Aortic  valve mean gradient measures 2.5 mmHg.   5. The inferior vena cava is normal in size with greater than 50%  respiratory variability, suggesting right atrial pressure of 3 mmHg.   Comparison(s): No prior Echocardiogram. Recent Labs: 02/15/2023: ALT 16; BUN 7; Creatinine, Ser 1.21; Hemoglobin 14.7; Platelets 229; Potassium 3.5; Sodium 146; TSH 0.634  Recent Lipid Panel    Component Value Date/Time   CHOL 179 02/15/2023 1445   TRIG 147 02/15/2023 1445   HDL 56 02/15/2023 1445   CHOLHDL 3.2 02/15/2023 1445   CHOLHDL 4.0 03/04/2020 1337   LDLCALC 97 02/15/2023 1445   LDLCALC 96 03/04/2020 1337    Risk Assessment/Calculations:   The 10-year ASCVD risk score (Arnett DK, et al., 2019) is: 7.6%   Values used to calculate the score:     Age: 5 years     Sex: Female     Is Non-Hispanic African American: No     Diabetic: Yes     Tobacco smoker: No     Systolic Blood Pressure: 128 mmHg     Is BP treated: Yes     HDL Cholesterol: 56 mg/dL     Total Cholesterol: 179 mg/dL   Home Medications   Current Meds  Medication Sig   Accu-Chek Softclix Lancets lancets 1 each by Other route daily. as directed   AIMOVIG 140 MG/ML SOAJ Inject 140 mg into the skin daily.   amLODipine (NORVASC) 5 MG tablet Take 1 tablet (5  mg total) by mouth daily.   aspirin EC 81 MG tablet Take 1 tablet by mouth daily.   atorvastatin (LIPITOR) 20 MG tablet TAKE 1 TABLET EVERY DAY   Blood Glucose Monitoring Suppl DEVI 1 each by Does not apply route in the morning, at noon, and at bedtime. May substitute to any manufacturer covered by patient's insurance.   cetirizine (ZYRTEC) 10 MG tablet TAKE 1 TABLET EVERY DAY   famotidine (PEPCID) 20 MG tablet Take 1 tablet (20 mg total) by mouth daily.   gabapentin (NEURONTIN) 300 MG capsule Take 600 mg by mouth at bedtime.   glipiZIDE (GLUCOTROL XL) 5 MG 24 hr tablet Take 1 tablet (5 mg total) by  mouth daily with breakfast.   glucose blood (ACCU-CHEK AVIVA PLUS) test strip 1 each by Other route daily. Use as instructed   Glucose Blood (BLOOD GLUCOSE TEST STRIPS) STRP 1 each by In Vitro route in the morning, at noon, and at bedtime. May substitute to any manufacturer covered by patient's insurance.   Lancet Device MISC 1 each by Does not apply route in the morning, at noon, and at bedtime. May substitute to any manufacturer covered by patient's insurance.   Lancets Misc. MISC 1 each by Does not apply route in the morning, at noon, and at bedtime. May substitute to any manufacturer covered by patient's insurance.   naproxen sodium (ALEVE) 220 MG tablet Take 440 mg by mouth daily as needed (for pain).   ofloxacin (FLOXIN) 0.3 % OTIC solution Place 5 drops into the right ear daily.   Oxcarbazepine (TRILEPTAL) 300 MG tablet Take 300 mg by mouth 2 (two) times daily.   polyethylene glycol powder (GLYCOLAX/MIRALAX) 17 GM/SCOOP powder Take 17 g by mouth 2 (two) times daily as needed.   sennosides-docusate sodium (SENOKOT-S) 8.6-50 MG tablet Take 1 tablet by mouth daily.   UNABLE TO FIND Aleve pain relieving lotion-daily   VITAMIN D PO Take 10,000 Units by mouth daily.   [DISCONTINUED] propranolol (INDERAL) 60 MG tablet Take 1 tablet (60 mg total) by mouth every morning. & 80 mg in the evening      Review of Systems    All other systems reviewed and are otherwise negative except as noted above.  Physical Exam    VS:  BP 128/82   Pulse 64   Ht 5\' 2"  (1.575 m)   Wt 173 lb (78.5 kg)   SpO2 97%   BMI 31.64 kg/m  , BMI Body mass index is 31.64 kg/m.  Wt Readings from Last 3 Encounters:  02/24/23 173 lb (78.5 kg)  02/15/23 170 lb 3.2 oz (77.2 kg)  12/29/22 178 lb 3.2 oz (80.8 kg)     GEN: Well nourished, well developed, in no acute distress. HEENT: normal. Neck: Supple, no JVD, carotid bruits, or masses. Cardiac: S1/S2, RRR, no murmurs, rubs, or gallops. No clubbing, cyanosis, edema.  Radials/PT 2+ and equal bilaterally.  Respiratory:  Respirations regular and unlabored, clear to auscultation bilaterally. GI: Soft, nontender, nondistended. MS: No deformity or atrophy. Skin: Warm and dry, no rash. Neuro:  Strength and sensation are intact. Psych: Normal affect, tearful at times  Assessment & Plan    CAD Stable with no anginal symptoms. No indication for ischemic evaluation. Continue Aspirin, atorvastatin, and propranolol. Heart healthy diet and regular cardiovascular exercise encouraged.   2. Palpitations Denies any recent palpitations. Continue propranolol, will refill. Heart healthy diet and regular cardiovascular exercise encouraged.   3. HTN BP stable and well controlled. Discussed to monitor BP at home at least 2 hours after medications and sitting for 5-10 minutes. No medication changes at this time.   4. Pulmonary nodule 5 mm left upper lobe pulmonary nodule noted on CT scan last year. Was recommended that no follow-up was needed if patient was low risk. Continue to follow with PCP.  5. Stress Tearful at times during interview. Previously saw therapist and is requesting referral, will provide referral.   Disposition: Follow up in 6 month(s) with Dina Rich, MD or APP.  Signed, Sharlene Dory, NP 02/27/2023, 9:28 PM [facility]

## 2023-02-25 ENCOUNTER — Telehealth: Payer: Self-pay | Admitting: Podiatry

## 2023-02-25 NOTE — Telephone Encounter (Signed)
Lmom for patient to schedule picking up diabetic shoes

## 2023-02-28 ENCOUNTER — Encounter: Payer: Self-pay | Admitting: Internal Medicine

## 2023-03-01 ENCOUNTER — Other Ambulatory Visit: Payer: Self-pay

## 2023-03-01 DIAGNOSIS — I10 Essential (primary) hypertension: Secondary | ICD-10-CM

## 2023-03-01 DIAGNOSIS — E1142 Type 2 diabetes mellitus with diabetic polyneuropathy: Secondary | ICD-10-CM

## 2023-03-01 DIAGNOSIS — E119 Type 2 diabetes mellitus without complications: Secondary | ICD-10-CM

## 2023-03-01 MED ORDER — GLIPIZIDE ER 5 MG PO TB24
5.0000 mg | ORAL_TABLET | Freq: Every day | ORAL | 3 refills | Status: DC
Start: 2023-03-01 — End: 2023-03-03

## 2023-03-01 MED ORDER — ACCU-CHEK AVIVA PLUS VI STRP
1.0000 | ORAL_STRIP | Freq: Every day | 3 refills | Status: DC
Start: 2023-03-01 — End: 2023-03-03

## 2023-03-01 MED ORDER — ACCU-CHEK SOFTCLIX LANCETS MISC
1.0000 | Freq: Every day | 3 refills | Status: DC
Start: 2023-03-01 — End: 2023-03-03

## 2023-03-01 MED ORDER — AMLODIPINE BESYLATE 5 MG PO TABS
5.0000 mg | ORAL_TABLET | Freq: Every day | ORAL | 1 refills | Status: DC
Start: 2023-03-01 — End: 2023-03-03

## 2023-03-02 ENCOUNTER — Ambulatory Visit (INDEPENDENT_AMBULATORY_CARE_PROVIDER_SITE_OTHER): Payer: Medicare PPO | Admitting: Podiatry

## 2023-03-02 DIAGNOSIS — M216X1 Other acquired deformities of right foot: Secondary | ICD-10-CM

## 2023-03-02 DIAGNOSIS — E119 Type 2 diabetes mellitus without complications: Secondary | ICD-10-CM

## 2023-03-02 DIAGNOSIS — M216X2 Other acquired deformities of left foot: Secondary | ICD-10-CM

## 2023-03-02 DIAGNOSIS — E1142 Type 2 diabetes mellitus with diabetic polyneuropathy: Secondary | ICD-10-CM

## 2023-03-02 NOTE — Progress Notes (Signed)
Patient presents today to pick up diabetic shoes and insoles.  Patient was dispensed 1 pair of diabetic shoes and 3 pairs of foam casted diabetic insoles. She tried on the shoes with the insoles and the fit was not satisfactory.   Shoes too tight in the mid arch. Ordered as 7W and she normally wears 8W  (last pair ordered was 7W but in a Men's boot)  I recommended we upsize to 8W women's.    We will send back: the 7W [id number]    Insoles were kept   to check fit with reordered shoes.  Patient will be contacted for a fitting appointment once the reordered shoes arrive in office.

## 2023-03-03 ENCOUNTER — Telehealth: Payer: Self-pay | Admitting: Internal Medicine

## 2023-03-03 ENCOUNTER — Other Ambulatory Visit: Payer: Self-pay

## 2023-03-03 ENCOUNTER — Telehealth: Payer: Self-pay | Admitting: Cardiology

## 2023-03-03 DIAGNOSIS — E119 Type 2 diabetes mellitus without complications: Secondary | ICD-10-CM

## 2023-03-03 DIAGNOSIS — E1142 Type 2 diabetes mellitus with diabetic polyneuropathy: Secondary | ICD-10-CM

## 2023-03-03 DIAGNOSIS — I10 Essential (primary) hypertension: Secondary | ICD-10-CM

## 2023-03-03 MED ORDER — ACCU-CHEK AVIVA PLUS VI STRP
1.0000 | ORAL_STRIP | Freq: Every day | 3 refills | Status: AC
Start: 2023-03-03 — End: ?

## 2023-03-03 MED ORDER — ACCU-CHEK SOFTCLIX LANCETS MISC
1.0000 | Freq: Every day | 3 refills | Status: AC
Start: 2023-03-03 — End: ?
  Filled 2024-02-23 (×2): qty 100, 100d supply, fill #0

## 2023-03-03 MED ORDER — AMLODIPINE BESYLATE 5 MG PO TABS
5.0000 mg | ORAL_TABLET | Freq: Every day | ORAL | 1 refills | Status: DC
Start: 2023-03-03 — End: 2023-10-31

## 2023-03-03 MED ORDER — GLIPIZIDE ER 5 MG PO TB24
5.0000 mg | ORAL_TABLET | Freq: Every day | ORAL | 3 refills | Status: DC
Start: 2023-03-03 — End: 2023-05-18

## 2023-03-03 NOTE — Telephone Encounter (Signed)
Spoke to [name], verbalized instructions for medication. Amanda Webster informed me that pt needs a prescription for 80 mg tablets as well as the 60 mg tablets to get the correct dosing.   Please advise.

## 2023-03-03 NOTE — Telephone Encounter (Signed)
Resent all medications and glucose supplies , originally sent in on 6/18

## 2023-03-03 NOTE — Telephone Encounter (Signed)
PT needs ALL new meds and refills sent in to [facility] mail in pharm   Pt needs all; glucose testing supplies sent in , states that center well reached out to her and has not received

## 2023-03-03 NOTE — Telephone Encounter (Signed)
Sig: Take 1 tablet (60 mg total) by mouth every morning. & 80 mg in the evening

## 2023-03-03 NOTE — Telephone Encounter (Signed)
Pt c/o medication issue:  1. Name of Medication:  propranolol (INDERAL) 60 MG tablet   2. How are you currently taking this medication (dosage and times per day)?   3. Are you having a reaction (difficulty breathing--STAT)?   4. What is your medication issue?   Caller wants call back to clarify dosage and instructions for this medication.

## 2023-03-04 DIAGNOSIS — I10 Essential (primary) hypertension: Secondary | ICD-10-CM | POA: Diagnosis not present

## 2023-03-04 DIAGNOSIS — G43909 Migraine, unspecified, not intractable, without status migrainosus: Secondary | ICD-10-CM | POA: Diagnosis not present

## 2023-03-04 DIAGNOSIS — G40909 Epilepsy, unspecified, not intractable, without status epilepticus: Secondary | ICD-10-CM | POA: Diagnosis not present

## 2023-03-04 DIAGNOSIS — M159 Polyosteoarthritis, unspecified: Secondary | ICD-10-CM | POA: Diagnosis not present

## 2023-03-04 DIAGNOSIS — E7849 Other hyperlipidemia: Secondary | ICD-10-CM | POA: Diagnosis not present

## 2023-03-04 DIAGNOSIS — E1142 Type 2 diabetes mellitus with diabetic polyneuropathy: Secondary | ICD-10-CM | POA: Diagnosis not present

## 2023-03-04 DIAGNOSIS — E1169 Type 2 diabetes mellitus with other specified complication: Secondary | ICD-10-CM | POA: Diagnosis not present

## 2023-03-04 DIAGNOSIS — G4730 Sleep apnea, unspecified: Secondary | ICD-10-CM | POA: Diagnosis not present

## 2023-03-04 DIAGNOSIS — G919 Hydrocephalus, unspecified: Secondary | ICD-10-CM | POA: Diagnosis not present

## 2023-03-07 MED ORDER — PROPRANOLOL HCL 80 MG PO TABS: ORAL_TABLET | ORAL | 3 refills | Status: DC

## 2023-03-07 NOTE — Telephone Encounter (Signed)
Inderall 80 mg tablet sent to pharmacy to be taken in addition to 60 mg tablets.

## 2023-03-07 NOTE — Telephone Encounter (Signed)
Per Shawnie Dapper, NP:  Just dug through her chart. Looks like we are comanaging propranolol with Neuro. Originally on for migraines, but also helping her hx of palpitations. Dr. Wyline Mood previously increased evening dose back from visit in 2023. Okay to refill with 60 in AM, 80 mg in PM

## 2023-03-10 ENCOUNTER — Ambulatory Visit (INDEPENDENT_AMBULATORY_CARE_PROVIDER_SITE_OTHER): Payer: Medicare PPO | Admitting: Podiatry

## 2023-03-10 DIAGNOSIS — M7661 Achilles tendinitis, right leg: Secondary | ICD-10-CM

## 2023-03-10 DIAGNOSIS — E1142 Type 2 diabetes mellitus with diabetic polyneuropathy: Secondary | ICD-10-CM | POA: Diagnosis not present

## 2023-03-10 DIAGNOSIS — M216X1 Other acquired deformities of right foot: Secondary | ICD-10-CM

## 2023-03-10 DIAGNOSIS — M216X2 Other acquired deformities of left foot: Secondary | ICD-10-CM | POA: Diagnosis not present

## 2023-03-10 DIAGNOSIS — M7671 Peroneal tendinitis, right leg: Secondary | ICD-10-CM

## 2023-03-10 NOTE — Progress Notes (Signed)
Patient presents today to pick up diabetic shoes and insoles.  Patient was dispensed 1 pair of diabetic shoes and 3 pairs of foam casted diabetic insoles. Fit was satisfactory. Instructions for break-in and wear was reviewed and a copy was given to the patient.   Re-appointment for regularly scheduled diabetic foot care visits or if they should experience any trouble with the shoes or insoles.  

## 2023-03-15 DIAGNOSIS — E1142 Type 2 diabetes mellitus with diabetic polyneuropathy: Secondary | ICD-10-CM | POA: Diagnosis not present

## 2023-03-15 DIAGNOSIS — G919 Hydrocephalus, unspecified: Secondary | ICD-10-CM | POA: Diagnosis not present

## 2023-03-15 DIAGNOSIS — G43909 Migraine, unspecified, not intractable, without status migrainosus: Secondary | ICD-10-CM | POA: Diagnosis not present

## 2023-03-15 DIAGNOSIS — I10 Essential (primary) hypertension: Secondary | ICD-10-CM | POA: Diagnosis not present

## 2023-03-15 DIAGNOSIS — G4730 Sleep apnea, unspecified: Secondary | ICD-10-CM | POA: Diagnosis not present

## 2023-03-15 DIAGNOSIS — G40909 Epilepsy, unspecified, not intractable, without status epilepticus: Secondary | ICD-10-CM | POA: Diagnosis not present

## 2023-03-15 DIAGNOSIS — E7849 Other hyperlipidemia: Secondary | ICD-10-CM | POA: Diagnosis not present

## 2023-03-15 DIAGNOSIS — E1169 Type 2 diabetes mellitus with other specified complication: Secondary | ICD-10-CM | POA: Diagnosis not present

## 2023-03-15 DIAGNOSIS — M159 Polyosteoarthritis, unspecified: Secondary | ICD-10-CM | POA: Diagnosis not present

## 2023-03-22 DIAGNOSIS — G4730 Sleep apnea, unspecified: Secondary | ICD-10-CM | POA: Diagnosis not present

## 2023-03-22 DIAGNOSIS — E7849 Other hyperlipidemia: Secondary | ICD-10-CM | POA: Diagnosis not present

## 2023-03-22 DIAGNOSIS — I10 Essential (primary) hypertension: Secondary | ICD-10-CM | POA: Diagnosis not present

## 2023-03-22 DIAGNOSIS — G919 Hydrocephalus, unspecified: Secondary | ICD-10-CM | POA: Diagnosis not present

## 2023-03-22 DIAGNOSIS — G40909 Epilepsy, unspecified, not intractable, without status epilepticus: Secondary | ICD-10-CM | POA: Diagnosis not present

## 2023-03-22 DIAGNOSIS — G43909 Migraine, unspecified, not intractable, without status migrainosus: Secondary | ICD-10-CM | POA: Diagnosis not present

## 2023-03-22 DIAGNOSIS — M159 Polyosteoarthritis, unspecified: Secondary | ICD-10-CM | POA: Diagnosis not present

## 2023-03-22 DIAGNOSIS — E1142 Type 2 diabetes mellitus with diabetic polyneuropathy: Secondary | ICD-10-CM | POA: Diagnosis not present

## 2023-03-22 DIAGNOSIS — E1169 Type 2 diabetes mellitus with other specified complication: Secondary | ICD-10-CM | POA: Diagnosis not present

## 2023-03-24 DIAGNOSIS — G918 Other hydrocephalus: Secondary | ICD-10-CM | POA: Diagnosis not present

## 2023-03-24 DIAGNOSIS — Z4541 Encounter for adjustment and management of cerebrospinal fluid drainage device: Secondary | ICD-10-CM | POA: Diagnosis not present

## 2023-03-24 DIAGNOSIS — Z982 Presence of cerebrospinal fluid drainage device: Secondary | ICD-10-CM | POA: Diagnosis not present

## 2023-03-29 DIAGNOSIS — E1142 Type 2 diabetes mellitus with diabetic polyneuropathy: Secondary | ICD-10-CM | POA: Diagnosis not present

## 2023-03-29 DIAGNOSIS — M159 Polyosteoarthritis, unspecified: Secondary | ICD-10-CM | POA: Diagnosis not present

## 2023-03-29 DIAGNOSIS — G40909 Epilepsy, unspecified, not intractable, without status epilepticus: Secondary | ICD-10-CM | POA: Diagnosis not present

## 2023-03-29 DIAGNOSIS — E7849 Other hyperlipidemia: Secondary | ICD-10-CM | POA: Diagnosis not present

## 2023-03-29 DIAGNOSIS — G919 Hydrocephalus, unspecified: Secondary | ICD-10-CM | POA: Diagnosis not present

## 2023-03-29 DIAGNOSIS — G4730 Sleep apnea, unspecified: Secondary | ICD-10-CM | POA: Diagnosis not present

## 2023-03-29 DIAGNOSIS — E1169 Type 2 diabetes mellitus with other specified complication: Secondary | ICD-10-CM | POA: Diagnosis not present

## 2023-03-29 DIAGNOSIS — I10 Essential (primary) hypertension: Secondary | ICD-10-CM | POA: Diagnosis not present

## 2023-03-29 DIAGNOSIS — G43909 Migraine, unspecified, not intractable, without status migrainosus: Secondary | ICD-10-CM | POA: Diagnosis not present

## 2023-03-30 ENCOUNTER — Other Ambulatory Visit: Payer: Self-pay | Admitting: Cardiology

## 2023-04-12 DIAGNOSIS — G40909 Epilepsy, unspecified, not intractable, without status epilepticus: Secondary | ICD-10-CM | POA: Diagnosis not present

## 2023-04-12 DIAGNOSIS — E1169 Type 2 diabetes mellitus with other specified complication: Secondary | ICD-10-CM | POA: Diagnosis not present

## 2023-04-12 DIAGNOSIS — E7849 Other hyperlipidemia: Secondary | ICD-10-CM | POA: Diagnosis not present

## 2023-04-12 DIAGNOSIS — M159 Polyosteoarthritis, unspecified: Secondary | ICD-10-CM | POA: Diagnosis not present

## 2023-04-12 DIAGNOSIS — G4730 Sleep apnea, unspecified: Secondary | ICD-10-CM | POA: Diagnosis not present

## 2023-04-12 DIAGNOSIS — E1142 Type 2 diabetes mellitus with diabetic polyneuropathy: Secondary | ICD-10-CM | POA: Diagnosis not present

## 2023-04-12 DIAGNOSIS — G919 Hydrocephalus, unspecified: Secondary | ICD-10-CM | POA: Diagnosis not present

## 2023-04-12 DIAGNOSIS — G43909 Migraine, unspecified, not intractable, without status migrainosus: Secondary | ICD-10-CM | POA: Diagnosis not present

## 2023-04-12 DIAGNOSIS — I10 Essential (primary) hypertension: Secondary | ICD-10-CM | POA: Diagnosis not present

## 2023-04-19 DIAGNOSIS — G919 Hydrocephalus, unspecified: Secondary | ICD-10-CM | POA: Diagnosis not present

## 2023-04-19 DIAGNOSIS — E7849 Other hyperlipidemia: Secondary | ICD-10-CM | POA: Diagnosis not present

## 2023-04-19 DIAGNOSIS — E1169 Type 2 diabetes mellitus with other specified complication: Secondary | ICD-10-CM | POA: Diagnosis not present

## 2023-04-19 DIAGNOSIS — G40909 Epilepsy, unspecified, not intractable, without status epilepticus: Secondary | ICD-10-CM | POA: Diagnosis not present

## 2023-04-19 DIAGNOSIS — M159 Polyosteoarthritis, unspecified: Secondary | ICD-10-CM | POA: Diagnosis not present

## 2023-04-19 DIAGNOSIS — G4730 Sleep apnea, unspecified: Secondary | ICD-10-CM | POA: Diagnosis not present

## 2023-04-19 DIAGNOSIS — E1142 Type 2 diabetes mellitus with diabetic polyneuropathy: Secondary | ICD-10-CM | POA: Diagnosis not present

## 2023-04-19 DIAGNOSIS — G43909 Migraine, unspecified, not intractable, without status migrainosus: Secondary | ICD-10-CM | POA: Diagnosis not present

## 2023-04-19 DIAGNOSIS — I10 Essential (primary) hypertension: Secondary | ICD-10-CM | POA: Diagnosis not present

## 2023-04-22 ENCOUNTER — Encounter: Payer: Self-pay | Admitting: Emergency Medicine

## 2023-04-25 DIAGNOSIS — G919 Hydrocephalus, unspecified: Secondary | ICD-10-CM | POA: Diagnosis not present

## 2023-04-25 DIAGNOSIS — G40909 Epilepsy, unspecified, not intractable, without status epilepticus: Secondary | ICD-10-CM | POA: Diagnosis not present

## 2023-04-25 DIAGNOSIS — E1142 Type 2 diabetes mellitus with diabetic polyneuropathy: Secondary | ICD-10-CM | POA: Diagnosis not present

## 2023-04-25 DIAGNOSIS — G4730 Sleep apnea, unspecified: Secondary | ICD-10-CM | POA: Diagnosis not present

## 2023-04-25 DIAGNOSIS — G43909 Migraine, unspecified, not intractable, without status migrainosus: Secondary | ICD-10-CM | POA: Diagnosis not present

## 2023-04-25 DIAGNOSIS — E7849 Other hyperlipidemia: Secondary | ICD-10-CM | POA: Diagnosis not present

## 2023-04-25 DIAGNOSIS — I10 Essential (primary) hypertension: Secondary | ICD-10-CM | POA: Diagnosis not present

## 2023-04-25 DIAGNOSIS — M159 Polyosteoarthritis, unspecified: Secondary | ICD-10-CM | POA: Diagnosis not present

## 2023-04-25 DIAGNOSIS — E1169 Type 2 diabetes mellitus with other specified complication: Secondary | ICD-10-CM | POA: Diagnosis not present

## 2023-04-29 ENCOUNTER — Ambulatory Visit
Admission: EM | Admit: 2023-04-29 | Discharge: 2023-04-29 | Disposition: A | Discharge status: Home / Self Care | Payer: Medicare PPO | Attending: Nurse Practitioner | Admitting: Nurse Practitioner

## 2023-04-29 ENCOUNTER — Telehealth: Payer: Self-pay | Admitting: Internal Medicine

## 2023-04-29 ENCOUNTER — Encounter: Payer: Self-pay | Admitting: Internal Medicine

## 2023-04-29 DIAGNOSIS — L282 Other prurigo: Secondary | ICD-10-CM

## 2023-04-29 MED ORDER — PREDNISONE 20 MG PO TABS: 40.0000 mg | ORAL_TABLET | Freq: Every day | ORAL | 0 refills | Status: AC

## 2023-04-29 MED ORDER — DEXAMETHASONE SODIUM PHOSPHATE 10 MG/ML IJ SOLN
10.0000 mg | INTRAMUSCULAR | Status: AC
  Administered 2023-04-29: 10 mg via INTRAMUSCULAR

## 2023-04-29 MED ORDER — TRIAMCINOLONE ACETONIDE 0.1 % EX CREA: 1.0000 | TOPICAL_CREAM | Freq: Two times a day (BID) | CUTANEOUS | 0 refills | Status: DC

## 2023-04-29 NOTE — ED Provider Notes (Signed)
[facility]    CSN: [id number] Arrival date & time: 04/29/23  1455      History   Chief Complaint No chief complaint on file.   HPI Amanda Webster is a 62 y.o. female.   The history is provided by the patient.   Patient presents for swelling, redness, and warmth in the left upper arm this been present for the past 3 days.  Patient states she was stung by a bee.  She states that the area is also itchy.  She denies fever, chills, chest pain, abdominal pain, bleeding, drainage, or oozing from the site.  Patient reports she has used several over-the-counter medications with minimal relief.  Patient reports that she is diabetic.  Most recent hemoglobin A1c was 5.8 on 6//24.  Past Medical History:  Diagnosis Date   Allergy    Anxiety    Arnold-Chiari malformation (HCC)    Arthritis    Borderline diabetic    Chronic headaches    Colon polyps    nonadenomatous   Depression    Diabetes mellitus without complication (HCC)    Diverticulosis    Esophageal stricture    FROZEN LEFT SHOULDER 04/01/2010   Qualifier: Diagnosis of  By: Romeo Apple MD, Duffy Rhody     GERD (gastroesophageal reflux disease)    HTN (hypertension)    Hyperlipidemia    IBS (irritable bowel syndrome)    Seizures (HCC)    pt not sure when her last seizure was- she says she just spaces out- no shaking but she could not tell me last seizure-  on Oxtellar bid for seizures    Sleep apnea    no cpap now- did use 02 with cpap but has not used either in "years"    Patient Active Problem List   Diagnosis Date Noted   Non-recurrent acute suppurative otitis media of right ear without spontaneous rupture of tympanic membrane 02/15/2023   Physical deconditioning 02/15/2023   Urinary tract infection without hematuria 10/07/2022   Gastroesophageal reflux disease 10/07/2022   Gait abnormality 09/21/2022   Chronic migraine w/o aura w/o status migrainosus, not intractable 09/21/2022   Seizure disorder (HCC)  09/21/2022   Lower abdominal pain 09/08/2022   Refused influenza vaccine 06/07/2022   Occipital headache 03/22/2022   Encounter for general adult medical examination with abnormal findings 02/03/2022   Chronic idiopathic constipation 10/06/2021   Morbid obesity (HCC) 07/10/2021   Bilateral carpal tunnel syndrome 07/06/2021   Orthostatic hypotension 07/06/2021   Hair loss 07/06/2021   Polyarthritis 07/06/2021   Localization-related epilepsy (HCC) 08/26/2020   History of Chiari malformation 08/12/2020   Primary osteoarthritis of both hands 08/05/2020   Primary osteoarthritis of both knees 08/05/2020   DDD (degenerative disc disease), lumbar 08/05/2020   Mixed stress and urge urinary incontinence 07/09/2020   Insomnia 04/21/2020   Hyperlipidemia associated with type 2 diabetes mellitus (HCC) 11/22/2019   Seborrheic keratoses 11/22/2019   Essential hypertension 08/27/2019   DM type 2 with diabetic peripheral neuropathy (HCC) 08/27/2019   Migraine 08/27/2019   Bulging of thoracic intervertebral disc without myelopathy 10/01/2015   Leg weakness, bilateral 09/12/2015   S/P VP shunt 03/15/2015   S/P cervical spinal fusion 03/15/2013   Hydrocephalus (HCC) 12/12/2012   Tinnitus 02/14/2012   Cervical spondylosis with myelopathy 01/15/2012   Chest pain at rest 03/08/2011   Sleep apnea 04/01/2010    Past Surgical History:  Procedure Laterality Date   BREAST BIOPSY Left    cahri decompression  8 and 03/1999  CHOLECYSTECTOMY     COLONOSCOPY     invasive cervical traction  02/2009   left breast-lumpectomy     POLYPECTOMY     shunt put in on right side of brain     SHUNT REPLACEMENT  12/10   TOTAL ABDOMINAL HYSTERECTOMY  Age 49 yrs   TAH & BSO   TUBAL LIGATION     UPPER GASTROINTESTINAL ENDOSCOPY      OB History     Gravida  3   Para  3   Term  3   Preterm      AB      Living  3      SAB      IAB      Ectopic      Multiple      Live Births  3             Home Medications    Prior to Admission medications   Medication Sig Start Date End Date Taking? Authorizing Provider  predniSONE (DELTASONE) 20 MG tablet Take 2 tablets (40 mg total) by mouth daily with breakfast for 5 days. 04/29/23 05/04/23 Yes Anquan Azzarello-Warren, Sadie Haber, NP  triamcinolone cream (KENALOG) 0.1 % Apply 1 Application topically 2 (two) times daily. 04/29/23  Yes Shadavia Dampier-Warren, Sadie Haber, NP  Accu-Chek Softclix Lancets lancets 1 each by Other route daily. as directed 03/03/23   Anabel Halon, MD  AIMOVIG 140 MG/ML SOAJ Inject 140 mg into the skin daily. 04/28/22   [provider]  amLODipine (NORVASC) 5 MG tablet Take 1 tablet (5 mg total) by mouth daily. 03/03/23   Anabel Halon, MD  aspirin EC 81 MG tablet Take 1 tablet by mouth daily.    [provider]  atorvastatin (LIPITOR) 20 MG tablet TAKE 1 TABLET EVERY DAY 07/02/22   Anabel Halon, MD  Blood Glucose Monitoring Suppl DEVI 1 each by Does not apply route in the morning, at noon, and at bedtime. May substitute to any manufacturer covered by patient's insurance. 02/15/23   Anabel Halon, MD  cetirizine (ZYRTEC) 10 MG tablet TAKE 1 TABLET EVERY DAY 06/11/22   Anabel Halon, MD  famotidine (PEPCID) 20 MG tablet Take 1 tablet (20 mg total) by mouth daily. 10/05/22   Anabel Halon, MD  gabapentin (NEURONTIN) 300 MG capsule Take 600 mg by mouth at bedtime.    [provider]  glipiZIDE (GLUCOTROL XL) 5 MG 24 hr tablet Take 1 tablet (5 mg total) by mouth daily with breakfast. 03/03/23   Anabel Halon, MD  glucose blood (ACCU-CHEK AVIVA PLUS) test strip 1 each by Other route daily. Use as instructed 03/03/23   Anabel Halon, MD  naproxen sodium (ALEVE) 220 MG tablet Take 440 mg by mouth daily as needed (for pain).    [provider]  ofloxacin (FLOXIN) 0.3 % OTIC solution Place 5 drops into the right ear daily. 02/15/23   Anabel Halon, MD  Omega-3 Fatty Acids (FISH OIL) 1000 MG CAPS Take 2  capsules by mouth daily. Patient not taking: Reported on 02/24/2023    [provider]  Oxcarbazepine (TRILEPTAL) 300 MG tablet Take 300 mg by mouth 2 (two) times daily.    [provider]  polyethylene glycol powder (GLYCOLAX/MIRALAX) 17 GM/SCOOP powder Take 17 g by mouth 2 (two) times daily as needed. 10/06/21   Anabel Halon, MD  propranolol (INDERAL) 60 MG tablet TAKE 1 TABLET EVERY MORNING 03/30/23  Sharlene Dory, NP  propranolol (INDERAL) 80 MG tablet Take 1 tablet by mouth nightly. To be taken in addition to 60 mg every morning. 03/07/23   Sharlene Dory, NP  sennosides-docusate sodium (SENOKOT-S) 8.6-50 MG tablet Take 1 tablet by mouth daily. 10/05/22   Anabel Halon, MD  UNABLE TO FIND Aleve pain relieving lotion-daily    [provider]  VITAMIN D PO Take 10,000 Units by mouth daily.    [provider]    Family History Family History  Problem Relation Age of Onset   Colon polyps Mother    Breast cancer Mother    Lung cancer Mother    Heart disease Father    Stroke Father    Aortic aneurysm Father    Chiari malformation Daughter    Addison's disease Son    Breast cancer Maternal Aunt    Colon cancer Neg Hx    Esophageal cancer Neg Hx    Rectal cancer Neg Hx    Stomach cancer Neg Hx     Social History Social History   Tobacco Use   Smoking status: Former    Current packs/day: 0.00    Average packs/day: 1.5 packs/day for 30.0 years (45.0 ttl pk-yrs)    Types: Cigarettes    Start date: 09/13/1960    Quit date: 09/13/1990    Years since quitting: 32.6    Passive exposure: Never   Smokeless tobacco: Never  Vaping Use   Vaping status: Never Used  Substance Use Topics   Alcohol use: No   Drug use: No     Allergies   Cymbalta [duloxetine hcl], Methocarbamol, Propoxyphene n-acetaminophen, and Sulfa antibiotics   Review of Systems Review of Systems Per HPI  Physical Exam Triage Vital Signs ED Triage Vitals  Encounter  Vitals Group     BP 04/29/23 1500 135/85     Systolic BP Percentile --      Diastolic BP Percentile --      Pulse Rate 04/29/23 1500 (!) 52     Resp 04/29/23 1500 20     Temp 04/29/23 1500 97.6 F (36.4 C)     Temp Source 04/29/23 1500 Oral     SpO2 04/29/23 1500 95 %     Weight --      Height --      Head Circumference --      Peak Flow --      Pain Score 04/29/23 1502 9     Pain Loc --      Pain Education --      Exclude from Growth Chart --    No data found.  Updated Vital Signs BP 135/85 (BP Location: Right Arm)   Pulse (!) 52   Temp 97.6 F (36.4 C) (Oral)   Resp 20   SpO2 95%   Visual Acuity Right Eye Distance:   Left Eye Distance:   Bilateral Distance:    Right Eye Near:   Left Eye Near:    Bilateral Near:     Physical Exam Vitals and nursing note reviewed.  Constitutional:      General: She is not in acute distress.    Appearance: Normal appearance.  HENT:     Head: Normocephalic.  Eyes:     Extraocular Movements: Extraocular movements intact.     Pupils: Pupils are equal, round, and reactive to light.  Musculoskeletal:     Left upper arm: Swelling present.       Arms:  Cervical back: Normal range of motion.  Skin:    General: Skin is warm and dry.  Neurological:     General: No focal deficit present.     Mental Status: She is alert and oriented to person, place, and time.  Psychiatric:        Mood and Affect: Mood normal.        Behavior: Behavior normal.      UC Treatments / Results  Labs (all labs ordered are listed, but only abnormal results are displayed) Labs Reviewed - No data to display  EKG   Radiology No results found.  Procedures Procedures (including critical care time)  Medications Ordered in UC Medications  dexamethasone (DECADRON) injection 10 mg (10 mg Intramuscular Given 04/29/23 1529)    Initial Impression / Assessment and Plan / UC Course  I have reviewed the triage vital signs and the nursing  notes.  Pertinent labs & imaging results that were available during my care of the patient were reviewed by me and considered in my medical decision making (see chart for details).  The patient is well-appearing, she is in no acute distress, vital signs are stable.  Will treat patient for papular urticaria after a bee sting.  Decadron 10 mg IM administered.  Patient was started on prednisone 40 mg for the next 5 days and triamcinolone 0.1% cream for itching.  Supportive care recommendations were provided and discussed with the patient to include use of cool compresses, over-the-counter antihistamines, and avoidance of hot baths or showers.  Patient was advised to monitor her blood glucose levels while taking the prednisone.  Patient advised to stop medication if blood glucose exceeds 300.  Patient was in agreement with this plan of care and verbalizes understanding.  All questions were answered.  Patient stable for discharge.   Final Clinical Impressions(s) / UC Diagnoses   Final diagnoses:  Papular urticaria     Discharge Instructions      Take medication as prescribed. May also take over-the-counter Zyrtec to help with itching. Avoid hot baths or showers while symptoms persist.  Recommend taking lukewarm baths. May apply cool cloths to the area to help with itching or discomfort. Avoid scratching, rubbing, or manipulating the areas while symptoms persist. Recommend Aveeno colloidal oatmeal bath to use to help with drying and itching. Follow up if symptoms do not improve.      ED Prescriptions     Medication Sig Dispense Auth. Provider   predniSONE (DELTASONE) 20 MG tablet Take 2 tablets (40 mg total) by mouth daily with breakfast for 5 days. 10 tablet Taj Arteaga-Warren, Sadie Haber, NP   triamcinolone cream (KENALOG) 0.1 % Apply 1 Application topically 2 (two) times daily. 30 g Cimberly Stoffel-Warren, Sadie Haber, NP      PDMP not reviewed this encounter.   Abran Cantor,  NP 04/29/23 1732

## 2023-04-29 NOTE — ED Triage Notes (Signed)
Pt reports she has gotten stung by a bee on her left arm x 3 days. The area is red, warm, and slightly swollen

## 2023-04-29 NOTE — Telephone Encounter (Signed)
Patient is calling says she got a bee sting a few days ago- says it's still a little swollen, itchy, and red. Not sure what to do, please advise. Thank you

## 2023-04-29 NOTE — Discharge Instructions (Addendum)
Take medication as prescribed. May also take over-the-counter Zyrtec to help with itching. Avoid hot baths or showers while symptoms persist.  Recommend taking lukewarm baths. May apply cool cloths to the area to help with itching or discomfort. Avoid scratching, rubbing, or manipulating the areas while symptoms persist. Recommend Aveeno colloidal oatmeal bath to use to help with drying and itching. Follow up if symptoms do not improve.  

## 2023-04-29 NOTE — Telephone Encounter (Signed)
Patient sent mychart message, fwd message to dr Allena Katz

## 2023-05-17 ENCOUNTER — Telehealth (HOSPITAL_COMMUNITY): Payer: Self-pay

## 2023-05-17 NOTE — Telephone Encounter (Signed)
05/19/23 appt confirmed

## 2023-05-18 ENCOUNTER — Encounter: Payer: Self-pay | Admitting: Internal Medicine

## 2023-05-18 ENCOUNTER — Ambulatory Visit: Payer: Medicare PPO | Admitting: Internal Medicine

## 2023-05-18 VITALS — BP 146/81 | HR 60 | Ht 62.0 in | Wt 193.4 lb

## 2023-05-18 DIAGNOSIS — I10 Essential (primary) hypertension: Secondary | ICD-10-CM

## 2023-05-18 DIAGNOSIS — E1142 Type 2 diabetes mellitus with diabetic polyneuropathy: Secondary | ICD-10-CM

## 2023-05-18 DIAGNOSIS — G919 Hydrocephalus, unspecified: Secondary | ICD-10-CM | POA: Diagnosis not present

## 2023-05-18 DIAGNOSIS — M170 Bilateral primary osteoarthritis of knee: Secondary | ICD-10-CM | POA: Diagnosis not present

## 2023-05-18 DIAGNOSIS — Z7984 Long term (current) use of oral hypoglycemic drugs: Secondary | ICD-10-CM

## 2023-05-18 DIAGNOSIS — E1169 Type 2 diabetes mellitus with other specified complication: Secondary | ICD-10-CM | POA: Diagnosis not present

## 2023-05-18 DIAGNOSIS — R110 Nausea: Secondary | ICD-10-CM

## 2023-05-18 DIAGNOSIS — E785 Hyperlipidemia, unspecified: Secondary | ICD-10-CM

## 2023-05-18 DIAGNOSIS — Z2821 Immunization not carried out because of patient refusal: Secondary | ICD-10-CM

## 2023-05-18 DIAGNOSIS — R809 Proteinuria, unspecified: Secondary | ICD-10-CM

## 2023-05-18 DIAGNOSIS — Z982 Presence of cerebrospinal fluid drainage device: Secondary | ICD-10-CM | POA: Diagnosis not present

## 2023-05-18 MED ORDER — SEMAGLUTIDE (1 MG/DOSE) 4 MG/3ML ~~LOC~~ SOPN: 1.0000 mg | PEN_INJECTOR | SUBCUTANEOUS | 3 refills | Status: DC

## 2023-05-18 MED ORDER — ONDANSETRON HCL 4 MG PO TABS: 4.0000 mg | ORAL_TABLET | Freq: Three times a day (TID) | ORAL | 0 refills | Status: DC | PRN

## 2023-05-18 MED ORDER — LOSARTAN POTASSIUM 25 MG PO TABS
25.0000 mg | ORAL_TABLET | Freq: Every day | ORAL | 0 refills | Status: DC
Start: 2023-05-18 — End: 2023-06-29

## 2023-05-18 NOTE — Assessment & Plan Note (Signed)
Followed by neurosurgery at [facility] Unclear if her current symptoms are due to hydrocephalus Could be recent TIA - on aspirin and statin currently

## 2023-05-18 NOTE — Patient Instructions (Addendum)
Please start taking Losartan as prescribed. Continue taking Amlodipine.  Please start taking Ozempic as prescribed. Stop taking Glipizide.  Please continue to take other medications as prescribed.  Please continue to follow low carb diet and ambulate as tolerated.  Please get blood tests done before the next visit.

## 2023-05-18 NOTE — Assessment & Plan Note (Signed)
Lab Results  Component Value Date   HGBA1C 5.8 (H) 02/15/2023   Associated with HTN and HLD Well-controlled with glipizide Due to recent weight gain, she prefers to start Ozempic again - agrees to follow low carb diet and small, frequent meals to avoid nausea; Zofran PRN for nausea Advised to follow diabetic diet On statin F/u CMP and lipid panel Diabetic eye exam: Advised to follow up with Ophthalmology for diabetic eye exam  On gabapentin 600 mg qHS for neuropathy

## 2023-05-18 NOTE — Progress Notes (Unsigned)
Established Patient Office Visit  Subjective:  Patient ID: Amanda Webster, female    DOB: 06-12-1961  Age: 62 y.o. MRN: [id number]  CC:  Chief Complaint  Patient presents with   Diabetes    Three month follow up    Hypertension    Three month follow up     HPI Amanda Webster is a 62 y.o. female with past medical history of HTN, type II DM with HLD, polyarthritis, migraine, seizure disorder, allergic rhinitis and morbid obesity who presents for f/u of her chronic medical conditions.  Hydrocephalus: She had revision of VP shunt in 04/24 due to recurrence of hydrocephalus.  She still reports fatigue and weakness.  Her vision has improved now. She had EEG in 01/24, which was unremarkable.  She had NCS/EMG by Dr Terrace Arabia, which showed mild axonal sensorimotor neuropathy.  She is already on gabapentin 600 mh qHS. She has felt progressive leg weakness lately as well, which is also due to recent weight gain and OA of knee.   Type II DM: She is taking Glipizide currently. Her last HbA1c was 5.8 in 06/24. She has gained about 23 lbs since stopping Ozempic 1 mg qw.  She had been able to lose weight, but had poor appetite and nausea every day. She has tried metformin, but had GI discomfort with it as well.  Denies any polyuria or polyphagia currently.   HTN: Her BP is well-controlled.  She takes amlodipine 5 mg QD and propranolol 80 mg QAM and 60 mg QPM (mainly for migraine).  She denies any dizziness, chest pain, dyspnea or palpitations currently.  Past Medical History:  Diagnosis Date   Allergy    Anxiety    Arnold-Chiari malformation (HCC)    Arthritis    Borderline diabetic    Chronic headaches    Colon polyps    nonadenomatous   Depression    Diabetes mellitus without complication (HCC)    Diverticulosis    Esophageal stricture    FROZEN LEFT SHOULDER 04/01/2010   Qualifier: Diagnosis of  By: Romeo Apple MD, Duffy Rhody     GERD (gastroesophageal reflux disease)    HTN (hypertension)     Hyperlipidemia    IBS (irritable bowel syndrome)    Seizures (HCC)    pt not sure when her last seizure was- she says she just spaces out- no shaking but she could not tell me last seizure-  on Oxtellar bid for seizures    Sleep apnea    no cpap now- did use 02 with cpap but has not used either in "years"    Past Surgical History:  Procedure Laterality Date   BREAST BIOPSY Left    cahri decompression  8 and 03/1999   CHOLECYSTECTOMY     COLONOSCOPY     invasive cervical traction  02/2009   left breast-lumpectomy     POLYPECTOMY     shunt put in on right side of brain     SHUNT REPLACEMENT  12/10   TOTAL ABDOMINAL HYSTERECTOMY  Age 67 yrs   TAH & BSO   TUBAL LIGATION     UPPER GASTROINTESTINAL ENDOSCOPY      Family History  Problem Relation Age of Onset   Colon polyps Mother    Breast cancer Mother    Lung cancer Mother    Heart disease Father    Stroke Father    Aortic aneurysm Father    Chiari malformation Daughter    Addison's disease Son  Breast cancer Maternal Aunt    Colon cancer Neg Hx    Esophageal cancer Neg Hx    Rectal cancer Neg Hx    Stomach cancer Neg Hx     Social History   Socioeconomic History   Marital status: Married    Spouse name: Not on file   Number of children: 3   Years of education: Not on file   Highest education level: Some college, no degree  Occupational History   Not on file  Tobacco Use   Smoking status: Former    Current packs/day: 0.00    Types: Cigarettes    Quit date: 09/13/1990    Years since quitting: 32.7    Passive exposure: Never   Smokeless tobacco: Never  Vaping Use   Vaping status: Never Used  Substance and Sexual Activity   Alcohol use: No   Drug use: No   Sexual activity: Not Currently    Birth control/protection: Surgical    Comment: hyst  Other Topics Concern   Not on file  Social History Narrative   Disabled from Chiari malformation/seizures.. Drinks 4-5 cups of tea daily. Does not get regular  exercise. Married for 40 years.   Social Determinants of Health   Financial Resource Strain: Medium Risk (05/17/2023)   Overall Financial Resource Strain (CARDIA)    Difficulty of Paying Living Expenses: Somewhat hard  Food Insecurity: Food Insecurity Present (05/17/2023)   Hunger Vital Sign    Worried About Running Out of Food in the Last Year: Sometimes true    Ran Out of Food in the Last Year: Sometimes true  Transportation Needs: Unmet Transportation Needs (05/17/2023)   PRAPARE - Administrator, Civil Service (Medical): No    Lack of Transportation (Non-Medical): Yes  Physical Activity: Insufficiently Active (05/17/2023)   Exercise Vital Sign    Days of Exercise per Week: 4 days    Minutes of Exercise per Session: 30 min  Stress: Stress Concern Present (05/17/2023)   [employer] of Occupational Health - Occupational Stress Questionnaire    Feeling of Stress : To some extent  Social Connections: Moderately Integrated (05/17/2023)   Social Connection and Isolation Panel [NHANES]    Frequency of Communication with Friends and Family: Three times a week    Frequency of Social Gatherings with Friends and Family: More than three times a week    Attends Religious Services: 1 to 4 times per year    Active Member of [employer] or Organizations: No    Attends Banker Meetings: Never    Marital Status: Married  Catering manager Violence: Not At Risk (12/09/2022)   Humiliation, Afraid, Rape, and Kick questionnaire    Fear of Current or Ex-Partner: No    Emotionally Abused: No    Physically Abused: No    Sexually Abused: No    Outpatient Medications Prior to Visit  Medication Sig Dispense Refill   Accu-Chek Softclix Lancets lancets 1 each by Other route daily. as directed 100 each 3   AIMOVIG 140 MG/ML SOAJ Inject 140 mg into the skin daily.     amLODipine (NORVASC) 5 MG tablet Take 1 tablet (5 mg total) by mouth daily. 90 tablet 1   aspirin EC 81 MG tablet Take 1  tablet by mouth daily.     atorvastatin (LIPITOR) 20 MG tablet TAKE 1 TABLET EVERY DAY 90 tablet 10   Blood Glucose Monitoring Suppl DEVI 1 each by Does not apply route in the morning, at  noon, and at bedtime. May substitute to any manufacturer covered by patient's insurance. 1 each 0   cetirizine (ZYRTEC) 10 MG tablet TAKE 1 TABLET EVERY DAY 90 tablet 0   famotidine (PEPCID) 20 MG tablet Take 1 tablet (20 mg total) by mouth daily. 30 tablet 3   gabapentin (NEURONTIN) 300 MG capsule Take 600 mg by mouth at bedtime.     glucose blood (ACCU-CHEK AVIVA PLUS) test strip 1 each by Other route daily. Use as instructed 100 strip 3   naproxen sodium (ALEVE) 220 MG tablet Take 440 mg by mouth daily as needed (for pain).     Omega-3 Fatty Acids (FISH OIL) 1000 MG CAPS Take 2 capsules by mouth daily. (Patient not taking: Reported on 02/24/2023)     Oxcarbazepine (TRILEPTAL) 300 MG tablet Take 300 mg by mouth 2 (two) times daily.     polyethylene glycol powder (GLYCOLAX/MIRALAX) 17 GM/SCOOP powder Take 17 g by mouth 2 (two) times daily as needed. 3350 g 1   propranolol (INDERAL) 60 MG tablet TAKE 1 TABLET EVERY MORNING 90 tablet 3   propranolol (INDERAL) 80 MG tablet Take 1 tablet by mouth nightly. To be taken in addition to 60 mg every morning. 90 tablet 3   sennosides-docusate sodium (SENOKOT-S) 8.6-50 MG tablet Take 1 tablet by mouth daily. 30 tablet 2   triamcinolone cream (KENALOG) 0.1 % Apply 1 Application topically 2 (two) times daily. 30 g 0   UNABLE TO FIND Aleve pain relieving lotion-daily     VITAMIN D PO Take 10,000 Units by mouth daily.     glipiZIDE (GLUCOTROL XL) 5 MG 24 hr tablet Take 1 tablet (5 mg total) by mouth daily with breakfast. 30 tablet 3   ofloxacin (FLOXIN) 0.3 % OTIC solution Place 5 drops into the right ear daily. 5 mL 0   No facility-administered medications prior to visit.    Allergies  Allergen Reactions   Cymbalta [Duloxetine Hcl] Itching    Nasal itching    Methocarbamol    Propoxyphene N-Acetaminophen Itching   Sulfa Antibiotics     ROS Review of Systems  Constitutional:  Positive for fatigue. Negative for chills and fever.  HENT:  Negative for congestion, sinus pressure and sinus pain.   Eyes:  Negative for pain and discharge.  Respiratory:  Negative for cough and shortness of breath.   Cardiovascular:  Negative for chest pain and palpitations.  Gastrointestinal:  Positive for constipation. Negative for diarrhea, nausea and vomiting.  Genitourinary:  Negative for frequency and hematuria.  Musculoskeletal:  Positive for arthralgias, back pain and gait problem. Negative for neck pain and neck stiffness.  Skin:  Negative for rash.  Neurological:  Positive for weakness and headaches.  Psychiatric/Behavioral:  Negative for agitation and behavioral problems.       Objective:    Physical Exam Vitals reviewed.  Constitutional:      General: She is not in acute distress.    Appearance: She is obese. She is not diaphoretic.  HENT:     Head: Normocephalic and atraumatic.     Nose: Nose normal.     Mouth/Throat:     Mouth: Mucous membranes are moist.  Eyes:     General: No scleral icterus.    Extraocular Movements: Extraocular movements intact.  Cardiovascular:     Rate and Rhythm: Normal rate and regular rhythm.     Heart sounds: Normal heart sounds. No murmur heard. Pulmonary:     Breath sounds: Normal breath sounds. No  wheezing or rales.  Abdominal:     Palpations: Abdomen is soft.     Tenderness: There is no abdominal tenderness.  Musculoskeletal:     Cervical back: Neck supple. No tenderness.     Right lower leg: No edema.     Left lower leg: No edema.  Skin:    General: Skin is warm.     Findings: No rash.  Neurological:     General: No focal deficit present.     Mental Status: She is alert and oriented to person, place, and time.     Sensory: Sensory deficit (b/l LE) present.     Motor: Weakness (RUE - 4/5, b/l LE -  4/5) present.  Psychiatric:        Mood and Affect: Mood normal.        Behavior: Behavior normal.     BP (!) 146/81 (BP Location: Right Arm, Patient Position: Sitting, Cuff Size: Normal)   Pulse 60   Ht 5\' 2"  (1.575 m)   Wt 193 lb 6.4 oz (87.7 kg)   SpO2 95%   BMI 35.37 kg/m  Wt Readings from Last 3 Encounters:  05/18/23 193 lb 6.4 oz (87.7 kg)  02/24/23 173 lb (78.5 kg)  02/15/23 170 lb 3.2 oz (77.2 kg)    Lab Results  Component Value Date   TSH 0.634 02/15/2023   Lab Results  Component Value Date   WBC 7.9 02/15/2023   HGB 14.7 02/15/2023   HCT 43.3 02/15/2023   MCV 90 02/15/2023   PLT 229 02/15/2023   Lab Results  Component Value Date   NA 146 (H) 02/15/2023   K 3.5 02/15/2023   CO2 27 02/15/2023   GLUCOSE 144 (H) 02/15/2023   BUN 7 (L) 02/15/2023   CREATININE 1.21 (H) 02/15/2023   BILITOT 0.3 02/15/2023   ALKPHOS 135 (H) 02/15/2023   AST 16 02/15/2023   ALT 16 02/15/2023   PROT 6.8 02/15/2023   ALBUMIN 4.6 02/15/2023   CALCIUM 10.0 02/15/2023   ANIONGAP 8 03/25/2021   EGFR 51 (L) 02/15/2023   Lab Results  Component Value Date   CHOL 179 02/15/2023   Lab Results  Component Value Date   HDL 56 02/15/2023   Lab Results  Component Value Date   LDLCALC 97 02/15/2023   Lab Results  Component Value Date   TRIG 147 02/15/2023   Lab Results  Component Value Date   CHOLHDL 3.2 02/15/2023   Lab Results  Component Value Date   HGBA1C 5.8 (H) 02/15/2023      Assessment & Plan:   Problem List Items Addressed This Visit       Cardiovascular and Mediastinum   Essential hypertension - Primary    BP: (!) 146/81   Uncontrolled with Amlodipine 5 mg QD and Propranolol now Added Losartan 25 mg QD Check BMP after 2 weeks Counseled for compliance with the medications Advised DASH diet and moderate exercise/walking, at least 150 mins/week      Relevant Medications   losartan (COZAAR) 25 MG tablet   Other Relevant Orders   CBC with  Differential/Platelet   Basic Metabolic Panel (BMET)     Endocrine   DM type 2 with diabetic peripheral neuropathy (HCC)    Lab Results  Component Value Date   HGBA1C 5.8 (H) 02/15/2023   Associated with HTN and HLD Well-controlled with glipizide Due to recent weight gain, she prefers to start Ozempic again - agrees to follow low carb diet and small,  frequent meals to avoid nausea; Zofran PRN for nausea Advised to follow diabetic diet On statin F/u CMP and lipid panel Diabetic eye exam: Advised to follow up with Ophthalmology for diabetic eye exam  On gabapentin 600 mg qHS for neuropathy      Relevant Medications   Semaglutide, 1 MG/DOSE, 4 MG/3ML SOPN   losartan (COZAAR) 25 MG tablet   Other Relevant Orders   Basic Metabolic Panel (BMET)   Urine Microalbumin w/creat. ratio   Hemoglobin A1c   Hyperlipidemia associated with type 2 diabetes mellitus (HCC)    On statin Check lipid profile      Relevant Medications   Semaglutide, 1 MG/DOSE, 4 MG/3ML SOPN   losartan (COZAAR) 25 MG tablet     Nervous and Auditory   Hydrocephalus (HCC)    Has history of Arnold-Chiari malformation VP shunt for hydrocephalus, but had recent increased size of ventricles noted on CT of head - had reprogramming on 10/25/22, and had revision shunt placement in 04/24 as she has recurrent hydrocephalus Followed by neurosurgeon        Musculoskeletal and Integument   Primary osteoarthritis of both knees    Takes Naproxen PRN Can try Voltaren gel PRN to avoid oral NSAIDs        Other   S/P VP shunt (Chronic)    Followed by neurosurgery at [facility] Unclear if her current symptoms are due to hydrocephalus Could be recent TIA - on aspirin and statin currently      Refused influenza vaccine   Proteinuria    Noted on urine microalbumin/creatinine ratio Likely due to type 2 DM Added Losartan      Other Visit Diagnoses     Nausea       Relevant Medications   ondansetron (ZOFRAN) 4 MG  tablet       Meds ordered this encounter  Medications   Semaglutide, 1 MG/DOSE, 4 MG/3ML SOPN    Sig: Inject 1 mg as directed once a week.    Dispense:  3 mL    Refill:  3   ondansetron (ZOFRAN) 4 MG tablet    Sig: Take 1 tablet (4 mg total) by mouth every 8 (eight) hours as needed for nausea or vomiting.    Dispense:  20 tablet    Refill:  0   losartan (COZAAR) 25 MG tablet    Sig: Take 1 tablet (25 mg total) by mouth daily.    Dispense:  45 tablet    Refill:  0    Follow-up: Return in about 6 weeks (around 06/29/2023) for HTN and DM.    Anabel Halon, MD

## 2023-05-18 NOTE — Assessment & Plan Note (Signed)
BP: (!) 146/81   Uncontrolled with Amlodipine 5 mg QD and Propranolol now Counseled for compliance with the medications Advised DASH diet and moderate exercise/walking, at least 150 mins/week

## 2023-05-19 ENCOUNTER — Encounter (HOSPITAL_COMMUNITY): Payer: Self-pay

## 2023-05-19 ENCOUNTER — Ambulatory Visit (INDEPENDENT_AMBULATORY_CARE_PROVIDER_SITE_OTHER): Payer: Medicare PPO | Admitting: Clinical

## 2023-05-19 DIAGNOSIS — F332 Major depressive disorder, recurrent severe without psychotic features: Secondary | ICD-10-CM

## 2023-05-19 DIAGNOSIS — R809 Proteinuria, unspecified: Secondary | ICD-10-CM | POA: Insufficient documentation

## 2023-05-19 NOTE — Assessment & Plan Note (Signed)
Has history of Arnold-Chiari malformation VP shunt for hydrocephalus, but had recent increased size of ventricles noted on CT of head - had reprogramming on 10/25/22, and had revision shunt placement in 04/24 as she has recurrent hydrocephalus Followed by neurosurgeon

## 2023-05-19 NOTE — Assessment & Plan Note (Signed)
On statin Check lipid profile 

## 2023-05-19 NOTE — Assessment & Plan Note (Addendum)
Noted on urine microalbumin/creatinine ratio Likely due to type 2 DM Added Losartan

## 2023-05-19 NOTE — Assessment & Plan Note (Signed)
Takes Naproxen PRN Can try Voltaren gel PRN to avoid oral NSAIDs
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
## 2023-05-24 ENCOUNTER — Telehealth: Payer: Self-pay | Admitting: Internal Medicine

## 2023-05-24 ENCOUNTER — Other Ambulatory Visit: Payer: Self-pay | Admitting: Internal Medicine

## 2023-05-24 DIAGNOSIS — Z792 Long term (current) use of antibiotics: Secondary | ICD-10-CM | POA: Insufficient documentation

## 2023-05-24 MED ORDER — AMOXICILLIN 500 MG PO CAPS: 2000.0000 mg | ORAL_CAPSULE | Freq: Once | ORAL | 0 refills | Status: AC

## 2023-05-24 NOTE — Telephone Encounter (Signed)
Patient came by office dropped off dental procedure form was incomplete dentist told patient needed to be medicated with antibiotic hour before the procedure and needs to be done by her pcp provider and noted on the form.

## 2023-05-24 NOTE — Telephone Encounter (Signed)
Dental work clearance Copied noted Sleeved  Call patient when ready

## 2023-05-24 NOTE — Telephone Encounter (Signed)
Called patient forms are ready for pick up at the front desk.

## 2023-05-25 NOTE — Telephone Encounter (Signed)
Patient picked up forms.

## 2023-06-08 ENCOUNTER — Telehealth (HOSPITAL_COMMUNITY): Payer: Self-pay | Admitting: *Deleted

## 2023-06-08 NOTE — Telephone Encounter (Signed)
Patient LVM concerning Therapy on Tuesday Amanda Webster. Stated she receive  Appt notice

## 2023-06-08 NOTE — Telephone Encounter (Signed)
Spoke with pt advised appt is 06/23/23 at 11 with terry

## 2023-06-14 ENCOUNTER — Other Ambulatory Visit: Payer: Self-pay

## 2023-06-14 DIAGNOSIS — E119 Type 2 diabetes mellitus without complications: Secondary | ICD-10-CM

## 2023-06-14 MED ORDER — ACCU-CHEK AVIVA PLUS VI STRP: 1.0000 | ORAL_STRIP | Freq: Every day | 3 refills | Status: DC

## 2023-06-15 ENCOUNTER — Ambulatory Visit (INDEPENDENT_AMBULATORY_CARE_PROVIDER_SITE_OTHER): Payer: Medicare PPO | Admitting: Adult Health

## 2023-06-15 ENCOUNTER — Encounter: Payer: Self-pay | Admitting: Adult Health

## 2023-06-15 VITALS — BP 109/87 | HR 70 | Ht 62.0 in | Wt 184.4 lb

## 2023-06-15 DIAGNOSIS — Z982 Presence of cerebrospinal fluid drainage device: Secondary | ICD-10-CM

## 2023-06-15 DIAGNOSIS — G43709 Chronic migraine without aura, not intractable, without status migrainosus: Secondary | ICD-10-CM

## 2023-06-15 DIAGNOSIS — G40909 Epilepsy, unspecified, not intractable, without status epilepticus: Secondary | ICD-10-CM

## 2023-06-15 MED ORDER — OXCARBAZEPINE 300 MG PO TABS: 300.0000 mg | ORAL_TABLET | Freq: Two times a day (BID) | ORAL | 3 refills | Status: DC

## 2023-06-15 MED ORDER — AIMOVIG 140 MG/ML ~~LOC~~ SOAJ: 140.0000 mg | SUBCUTANEOUS | 11 refills | Status: DC

## 2023-06-15 NOTE — Patient Instructions (Addendum)
Your Plan:  Continue Trileptal 300mg  twice daily for seizure prevention  Please call with any recurrent seizures - can be caused by increased stress but if these should continue to occur and especially without any further stress, may need to consider increasing dosage   Continue Aimovig for migraine prevention    Follow up in 6 months or call earlier if needed     Thank you for coming to see [place] at [facility]. I hope we have been able to provide you high quality care today.  You may receive a patient satisfaction survey over the next few weeks. We would appreciate your feedback and comments so that we may continue to improve ourselves and the health of our patients.

## 2023-06-15 NOTE — Progress Notes (Signed)
Chief Complaint  Patient presents with   Follow-up    Patient in room #3 and alone. Patient states she was doing great but she been going through some teeth procedure done and taking pain pills.      ASSESSMENT AND PLAN  Amanda Webster is a 62 y.o. female   History of Chiari decompression, placement of VP shunt  Followed by Dr. Angelyn Punt neurosurgery  Reports past month reoccurrence of gait impairment, intermittent posterior headaches, intermittent blurry vision and intermittent B/B incontinence - will send note to neurosurgery today but also encouraged patient to reach out to neurosurgery office for further recommendations             Shunt failure requiring revision on 12/30/2022 with replacement of programmable shunt Revision by [facility] Dr. Angelyn Punt on August 20, 2022, due to reported slow worsening headache, unsteady gait, presurgical repeat MRI showed worsening ventricular size     Diabetic neuropathy             EMG nerve conduction study confirmed mild axonal sensorimotor neuropathy, consistent with her diabetic peripheral neuropathy  On gabapentin 600mg  at bedtime managed by PCP  Recently started on Ozempic for DM management    Reported history of seizure,             Spacing spells, a couple recent episodes felt to be provoked in setting of significantly increased stress  Discussed either increasing Trileptal or keeping the same for now, she has been trying to work on stress levels as she knows this will trigger seizure. Would like to hold off on adjustment at this time. She was advised to call with persistent recurrent episodes and will likely need to adjust dosage at that time             On Trileptal 300mg  bid             EEG was normal in January 2024,   Occasionally headache with migraine features             No recent migraine type headaches  On Aimovig monthly injection - refill provided  No benefit with botox    Follow-up in 6 months or call earlier if  needed      DIAGNOSTIC DATA (LABS, IMAGING, TESTING) - I reviewed patient records, labs, notes, testing and imaging myself where available.   MEDICAL HISTORY:   Update 06/15/2023 JM:  Reports having a couple seizures since prior visit, typical episode with starring, usually occurs with increased stress which she has been experiencing more of caring for her mother with Alzheimer's dementia.  Reports compliance on Trileptal 300 mg twice daily, denies any specific side effects.  Denies any recent migraine headaches, continues on Aimovig monthly injection.  Continues to be followed by [facility] neurosurgery Dr. Angelyn Punt who noted shunt failure requiring revision 4/18 with replacement to [employer] programmable shunt set to 4. Settings changed to 2 in May d/t c/o dizziness and headaches.  Recent f/u with neurosurgery 7/11 noted improvement of symptoms after setting change.  Called neurosurgery on 9/16 reporting different type of chronic dizziness, blurred vision, posterior headache, B/B incontinence and recent fall. Per telephone note, NP recommended patient proceed to ED for urgent scan and to check for shunt failure but patient reports never being called back with recommendations. Reports symptoms still persistent, present over the past month. Reports B/B incontinence, blurry vision and posterior headaches intermittent. Feels like she walks like she is drunk. She plans on calling neurosurgery  again to follow up.       UPDATE December 15 2022 Dr. Terrace Arabia: She complains of frequent dizziness, especially when standing up, risk for fall, limited water intake, orthostatic blood pressure was positive today,   Also complains of intermittent headache, nauseous, lightheaded, improved by sleep, related to weather change, have some migraine features, never tried triptans in the past,   We personally reviewed MRI of cervical spine from February 2024, ACDF C5-6, C6-7, no residual cord or foraminal stenosis,  mild degenerative changes at C3-4, evidence of suboccipital craniectomy   Follow-up with neurosurgeon Dr. Angelyn Punt in May 2024   Consult visit 09/21/2022 Dr. Terrace Arabia: Amanda Webster is a 62 year old female, seen in request by her primary care doctor Anabel Halon, for evaluation of gait abnormality, history of VP shunt, need to establish neurological care, was previously seen by Dr. Gerilyn Pilgrim   I reviewed and summarized the referring note. PMHX HTN HLD DM for more than 10 years. Anxiety Seizure, space out, last one was in Dec 2023, brought on by stress Obstructive sleep apnea, could not tolerate CPAP. VP shunt  Patient has a history of posterior decompression surgery for Arnold-Chiari formation in 2000, reported prior to the surgery, she has frequent headaches, weakness, also bilateral hand muscle weakness, reported surgery was helpful, but she has developed complications," spinal fluid leak?',  Eventually had VP shunt placement,  She was able to go back to to work, then had increased difficulties, constellation of complaints, generalized weakness, unsteady gait, around 2010, went on disability  She also had a history of seizure disorder started shortly after her decompression surgery, initially frequent ajovy triggered by stress, now only has small spells intermittently, spacing out, lasting for few minutes, felt fatigued afterwards, had to go to sleep, is taking Trileptal 300 twice a day  She was seen by [facility] neurosurgeon Dr. Since October 2023 for worsening mental postop, increased headache, also mild unsteady gait, leading to revision with Dr. Regino Bellow August 20, 2022, reported some improvement, walk better,  Personally reviewed MRI of the brain September 2023, prior to most revision, chronic CSF shunt with increased lateral and third ventricular/complex abnormality in 2016, no definite transependymal edema, increased nonspecific signal changes in the left frontal pole,    PHYSICAL  EXAM:   Vitals:   06/15/23 1506  BP: 109/87  Pulse: 70  Weight: 184 lb 6.4 oz (83.6 kg)  Height: 5\' 2"  (1.575 m)   Body mass index is 33.73 kg/m.  PHYSICAL EXAMNIATION:  Gen: NAD, very pleasant middle-age Caucasian female, conversant, well nourised, well groomed                     Cardiovascular: Regular rate rhythm, no peripheral edema, warm, nontender. Eyes: Conjunctivae clear without exudates or hemorrhage Neck: Supple, no carotid bruits. Pulmonary: Clear to auscultation bilaterally   NEUROLOGICAL EXAM:  MENTAL STATUS: Speech/cognition: Awake, alert, oriented to history taking and casual conversation CRANIAL NERVES: CN II: Visual fields are full to confrontation. Pupils are round equal and briskly reactive to light. CN III, IV, VI: extraocular movement are normal. No ptosis. CN V: Facial sensation is intact to light touch CN VII: Face is symmetric with normal eye closure  CN VIII: Hearing is normal to causal conversation. CN IX, X: Phonation is normal. CN XI: Head turning and shoulder shrug are intact  MOTOR: There is no pronator drift of out-stretched arms. Muscle bulk and tone are normal. Muscle strength is normal.  REFLEXES: Reflexes are 2+  and symmetric at the biceps, triceps, knees, and trace ankles. Plantar responses are extensor bilaterally  SENSORY: Length-dependent decreased light touch, pinprick to mid shin level  COORDINATION: There is no trunk or limb dysmetria noted.  GAIT/STANCE: Needs push-up to get up from seated position, unsteady gait greater returns, no use of AD, tandem walk and heel toe not attempted  REVIEW OF SYSTEMS:  Full 14 system review of systems performed and notable only for as above All other review of systems were negative.   ALLERGIES: Allergies  Allergen Reactions   Cymbalta [Duloxetine Hcl] Itching    Nasal itching   Methocarbamol    Propoxyphene N-Acetaminophen Itching   Sulfa Antibiotics     HOME  MEDICATIONS: Current Outpatient Medications  Medication Sig Dispense Refill   Accu-Chek Softclix Lancets lancets 1 each by Other route daily. as directed 100 each 3   AIMOVIG 140 MG/ML SOAJ Inject 140 mg into the skin daily.     amLODipine (NORVASC) 5 MG tablet Take 1 tablet (5 mg total) by mouth daily. 90 tablet 1   aspirin EC 81 MG tablet Take 1 tablet by mouth daily.     atorvastatin (LIPITOR) 20 MG tablet TAKE 1 TABLET EVERY DAY 90 tablet 10   Blood Glucose Monitoring Suppl DEVI 1 each by Does not apply route in the morning, at noon, and at bedtime. May substitute to any manufacturer covered by patient's insurance. 1 each 0   cetirizine (ZYRTEC) 10 MG tablet TAKE 1 TABLET EVERY DAY 90 tablet 0   famotidine (PEPCID) 20 MG tablet Take 1 tablet (20 mg total) by mouth daily. 30 tablet 3   gabapentin (NEURONTIN) 300 MG capsule Take 600 mg by mouth at bedtime.     glucose blood (ACCU-CHEK AVIVA PLUS) test strip 1 each by Other route daily. Use as instructed 100 strip 3   losartan (COZAAR) 25 MG tablet Take 1 tablet (25 mg total) by mouth daily. 45 tablet 0   naproxen sodium (ALEVE) 220 MG tablet Take 440 mg by mouth daily as needed (for pain).     ondansetron (ZOFRAN) 4 MG tablet Take 1 tablet (4 mg total) by mouth every 8 (eight) hours as needed for nausea or vomiting. 20 tablet 0   Oxcarbazepine (TRILEPTAL) 300 MG tablet Take 300 mg by mouth 2 (two) times daily.     polyethylene glycol powder (GLYCOLAX/MIRALAX) 17 GM/SCOOP powder Take 17 g by mouth 2 (two) times daily as needed. 3350 g 1   propranolol (INDERAL) 60 MG tablet TAKE 1 TABLET EVERY MORNING 90 tablet 3   propranolol (INDERAL) 80 MG tablet Take 1 tablet by mouth nightly. To be taken in addition to 60 mg every morning. 90 tablet 3   Semaglutide, 1 MG/DOSE, 4 MG/3ML SOPN Inject 1 mg as directed once a week. 3 mL 3   sennosides-docusate sodium (SENOKOT-S) 8.6-50 MG tablet Take 1 tablet by mouth daily. 30 tablet 2   triamcinolone cream  (KENALOG) 0.1 % Apply 1 Application topically 2 (two) times daily. 30 g 0   UNABLE TO FIND Aleve pain relieving lotion-daily     VITAMIN D PO Take 10,000 Units by mouth daily.     Omega-3 Fatty Acids (FISH OIL) 1000 MG CAPS Take 2 capsules by mouth daily. (Patient not taking: Reported on 02/24/2023)     No current facility-administered medications for this visit.    PAST MEDICAL HISTORY: Past Medical History:  Diagnosis Date   Allergy    Anxiety  Arnold-Chiari malformation (HCC)    Arthritis    Borderline diabetic    Chronic headaches    Colon polyps    nonadenomatous   Depression    Diabetes mellitus without complication (HCC)    Diverticulosis    Esophageal stricture    FROZEN LEFT SHOULDER 04/01/2010   Qualifier: Diagnosis of  By: Romeo Apple MD, Duffy Rhody     GERD (gastroesophageal reflux disease)    HTN (hypertension)    Hyperlipidemia    IBS (irritable bowel syndrome)    Seizures (HCC)    pt not sure when her last seizure was- she says she just spaces out- no shaking but she could not tell me last seizure-  on Oxtellar bid for seizures    Sleep apnea    no cpap now- did use 02 with cpap but has not used either in "years"    PAST SURGICAL HISTORY: Past Surgical History:  Procedure Laterality Date   BREAST BIOPSY Left    cahri decompression  6 and 03/1999   CHOLECYSTECTOMY     COLONOSCOPY     invasive cervical traction  02/2009   left breast-lumpectomy     POLYPECTOMY     shunt put in on right side of brain     SHUNT REPLACEMENT  12/10   TOTAL ABDOMINAL HYSTERECTOMY  Age 60 yrs   TAH & BSO   TUBAL LIGATION     UPPER GASTROINTESTINAL ENDOSCOPY      FAMILY HISTORY: Family History  Problem Relation Age of Onset   Colon polyps Mother    Breast cancer Mother    Lung cancer Mother    Heart disease Father    Stroke Father    Aortic aneurysm Father    Chiari malformation Daughter    Addison's disease Son    Breast cancer Maternal Aunt    Colon cancer Neg Hx     Esophageal cancer Neg Hx    Rectal cancer Neg Hx    Stomach cancer Neg Hx     SOCIAL HISTORY: Social History   Socioeconomic History   Marital status: Married    Spouse name: Not on file   Number of children: 3   Years of education: Not on file   Highest education level: Some college, no degree  Occupational History   Not on file  Tobacco Use   Smoking status: Former    Current packs/day: 0.00    Types: Cigarettes    Quit date: 09/13/1990    Years since quitting: 32.7    Passive exposure: Never   Smokeless tobacco: Never  Vaping Use   Vaping status: Never Used  Substance and Sexual Activity   Alcohol use: No   Drug use: No   Sexual activity: Not Currently    Birth control/protection: Surgical    Comment: hyst  Other Topics Concern   Not on file  Social History Narrative   Disabled from Chiari malformation/seizures.. Drinks 4-5 cups of tea daily. Does not get regular exercise. Married for 40 years.   Social Determinants of Health   Financial Resource Strain: Medium Risk (05/17/2023)   Overall Financial Resource Strain (CARDIA)    Difficulty of Paying Living Expenses: Somewhat hard  Food Insecurity: Food Insecurity Present (05/17/2023)   Hunger Vital Sign    Worried About Running Out of Food in the Last Year: Sometimes true    Ran Out of Food in the Last Year: Sometimes true  Transportation Needs: Unmet Transportation Needs (05/17/2023)   PRAPARE - Transportation  Lack of Transportation (Medical): No    Lack of Transportation (Non-Medical): Yes  Physical Activity: Insufficiently Active (05/17/2023)   Exercise Vital Sign    Days of Exercise per Week: 4 days    Minutes of Exercise per Session: 30 min  Stress: Stress Concern Present (05/17/2023)   [employer] of Occupational Health - Occupational Stress Questionnaire    Feeling of Stress : To some extent  Social Connections: Moderately Integrated (05/17/2023)   Social Connection and Isolation Panel [NHANES]     Frequency of Communication with Friends and Family: Three times a week    Frequency of Social Gatherings with Friends and Family: More than three times a week    Attends Religious Services: 1 to 4 times per year    Active Member of [employer] or Organizations: No    Attends Banker Meetings: Never    Marital Status: Married  Catering manager Violence: Not At Risk (12/09/2022)   Humiliation, Afraid, Rape, and Kick questionnaire    Fear of Current or Ex-Partner: No    Emotionally Abused: No    Physically Abused: No    Sexually Abused: No      I spent 30 minutes of face-to-face and non-face-to-face time with patient.  This included previsit chart review, lab review, study review, order entry, electronic health record documentation, patient education and discussion regarding above diagnoses and treatment plan and answered all other questions to patient's satisfaction  Ihor Austin, [facility]  [facility] [address] [place], [place] [address]  Phone [phone] Fax [phone] Note: This document was prepared with digital dictation and possible smart phrase technology. Any transcriptional errors that result from this process are unintentional.

## 2023-06-23 ENCOUNTER — Ambulatory Visit (INDEPENDENT_AMBULATORY_CARE_PROVIDER_SITE_OTHER): Payer: Medicare PPO | Admitting: Clinical

## 2023-06-23 DIAGNOSIS — I10 Essential (primary) hypertension: Secondary | ICD-10-CM | POA: Diagnosis not present

## 2023-06-23 DIAGNOSIS — F332 Major depressive disorder, recurrent severe without psychotic features: Secondary | ICD-10-CM | POA: Diagnosis not present

## 2023-06-23 DIAGNOSIS — E1142 Type 2 diabetes mellitus with diabetic polyneuropathy: Secondary | ICD-10-CM | POA: Diagnosis not present

## 2023-06-23 NOTE — Progress Notes (Signed)
IN PERSON    I connected with Amanda Webster Cardell on 06/23/23 at  11:00 AM ESTin person  and verified that I am speaking with the correct person using two identifiers.   Location: Patient: Office  Provider: Office   I discussed the limitations of evaluation and management by telemedicine and the availability of in person appointments. The patient expressed understanding and agreed to proceed. ( IN PERSON)     THERAPIST PROGRESS NOTE   Session Time: 11:00AM-11:45AM   Participation Level: Active   Behavioral Response: CasualAlertDepressed   Type of Therapy: Individual Therapy   Treatment Goals addressed: Coping   Interventions: CBT, Motivational Interviewing and Strength-based   Summary: Amanda Webster is a 62 y.o. female who presents with Depression. The OPT therapist utilized Motivational Interviewing to assist in continuing therapeutic repore. The patient in the session was engaged and work in collaboration giving feedback about her triggers and symptoms over the past few weeks. The patient spoke about her largest adjustment in getting use to wearing dentures post recent dental extraction work. Additionally the patient spoke about the impact of caregiving for her elderly Mother who struggles with Dementia. The OPT therapist utilized Cognitive Behavioral Therapy through cognitive restructuring as well as worked with the patient on coping strategies to assist in management of her mental health symptoms. The patient noted that she is still working to stay active within her health limitations. The OPT therapist overviewed self checkin and adding positive affirmations.   Suicidal/Homicidal: Nowithout intent/plan   Therapist Response: The OPT therapist worked with the patient for the patients session. The patient was engaged in her session and gave feedback in relation to triggers, symptoms, and behavior responses over the past few weeks. The patient is recovering post recent dental surgery  and getting use to having dentures. The patient overviewed working on her awareness of the impact of stress from caregiving for her elderly Mother who suffers from Dementia. The OPT therapist worked with the patient utilizing an in session Cognitive Behavioral Therapy exercise. The patient was responsive in the session and acknowledged her work in implementing coping skills/ distractions as well as utilizing positive thinking and staying active within her physical health limits and indicated looking forward to being active over the next few days. The OPT therapist gauged the patients mood over the course of the past week including her interactions with family and spoke about the upcoming Holiday's. The OPT therapist will continue treatment work with the patient in her next scheduled session   Plan: Follow-up in 2/3 weeks   Diagnosis:      Axis I:Major depressive disorder, recurrent severe without psychotic features                           Axis II: No diagnosis   I discussed the assessment and treatment plan with the patient. The patient was provided an opportunity to ask questions and all were answered. The patient agreed with the plan and demonstrated an understanding of the instructions.   The patient was advised to call back or seek an in-person evaluation if the symptoms worsen or if the condition fails to improve as anticipated.   I provided 45 minutes of face-to-face time during this encounter   Winfred Burn, LCSW  06/23/2023

## 2023-06-25 LAB — CBC WITH DIFFERENTIAL/PLATELET
Basophils Absolute: 0 10*3/uL (ref 0.0–0.2)
Basos: 0 %
EOS (ABSOLUTE): 0.1 10*3/uL (ref 0.0–0.4)
Eos: 2 %
Hematocrit: 39.6 % (ref 34.0–46.6)
Hemoglobin: 13.5 g/dL (ref 11.1–15.9)
Immature Grans (Abs): 0 10*3/uL (ref 0.0–0.1)
Immature Granulocytes: 1 %
Lymphocytes Absolute: 1.5 10*3/uL (ref 0.7–3.1)
Lymphs: 32 %
MCH: 31.3 pg (ref 26.6–33.0)
MCHC: 34.1 g/dL (ref 31.5–35.7)
MCV: 92 fL (ref 79–97)
Monocytes Absolute: 0.4 10*3/uL (ref 0.1–0.9)
Monocytes: 8 %
Neutrophils Absolute: 2.5 10*3/uL (ref 1.4–7.0)
Neutrophils: 57 %
Platelets: 223 10*3/uL (ref 150–450)
RBC: 4.32 x10E6/uL (ref 3.77–5.28)
RDW: 12 % (ref 11.7–15.4)
WBC: 4.5 10*3/uL (ref 3.4–10.8)

## 2023-06-25 LAB — BASIC METABOLIC PANEL
BUN/Creatinine Ratio: 6 — ABNORMAL LOW (ref 12–28)
BUN: 5 mg/dL — ABNORMAL LOW (ref 8–27)
CO2: 26 mmol/L (ref 20–29)
Calcium: 9.6 mg/dL (ref 8.7–10.3)
Chloride: 93 mmol/L — ABNORMAL LOW (ref 96–106)
Creatinine, Ser: 0.78 mg/dL (ref 0.57–1.00)
Glucose: 135 mg/dL — ABNORMAL HIGH (ref 70–99)
Potassium: 4 mmol/L (ref 3.5–5.2)
Sodium: 137 mmol/L (ref 134–144)
eGFR: 87 mL/min/{1.73_m2} (ref >59)

## 2023-06-25 LAB — MICROALBUMIN / CREATININE URINE RATIO
Creatinine, Urine: 46.5 mg/dL
Microalb/Creat Ratio: <6 mg/g{creat} (ref 0–29)
Microalbumin, Urine: <3 ug/mL

## 2023-06-25 LAB — HEMOGLOBIN A1C
Est. average glucose Bld gHb Est-mCnc: 154 mg/dL
Hgb A1c MFr Bld: 7 % — ABNORMAL HIGH (ref 4.8–5.6)

## 2023-06-29 ENCOUNTER — Ambulatory Visit (INDEPENDENT_AMBULATORY_CARE_PROVIDER_SITE_OTHER): Payer: Medicare PPO | Admitting: Internal Medicine

## 2023-06-29 ENCOUNTER — Encounter: Payer: Self-pay | Admitting: Internal Medicine

## 2023-06-29 VITALS — BP 138/82 | HR 62 | Ht 62.0 in | Wt 185.2 lb

## 2023-06-29 DIAGNOSIS — I10 Essential (primary) hypertension: Secondary | ICD-10-CM | POA: Diagnosis not present

## 2023-06-29 DIAGNOSIS — E1169 Type 2 diabetes mellitus with other specified complication: Secondary | ICD-10-CM

## 2023-06-29 DIAGNOSIS — E785 Hyperlipidemia, unspecified: Secondary | ICD-10-CM

## 2023-06-29 DIAGNOSIS — E1142 Type 2 diabetes mellitus with diabetic polyneuropathy: Secondary | ICD-10-CM

## 2023-06-29 DIAGNOSIS — Z7985 Long-term (current) use of injectable non-insulin antidiabetic drugs: Secondary | ICD-10-CM | POA: Diagnosis not present

## 2023-06-29 DIAGNOSIS — Z982 Presence of cerebrospinal fluid drainage device: Secondary | ICD-10-CM

## 2023-06-29 MED ORDER — LOSARTAN POTASSIUM 25 MG PO TABS
25.0000 mg | ORAL_TABLET | Freq: Every day | ORAL | 1 refills | Status: DC
Start: 2023-06-29 — End: 2023-07-08

## 2023-06-29 MED ORDER — ATORVASTATIN CALCIUM 20 MG PO TABS
20.0000 mg | ORAL_TABLET | Freq: Every day | ORAL | 3 refills | Status: DC
Start: 2023-06-29 — End: 2024-07-01
  Filled 2024-02-23 (×2): qty 90, 90d supply, fill #0

## 2023-06-29 NOTE — Progress Notes (Signed)
Established Patient Office Visit  Subjective:  Patient ID: Amanda Webster, female    DOB: Jan 12, 1961  Age: 61 y.o. MRN: [id number]  CC:  Chief Complaint  Patient presents with   Hypertension    Six week follow up    Diabetes    Six week follow up     HPI Amanda Webster is a 62 y.o. female with past medical history of HTN, type II DM with HLD, polyarthritis, migraine, seizure disorder, allergic rhinitis and morbid obesity who presents for f/u of her chronic medical conditions.   Type II DM: She is taking Ozempic 1 mg QW currently. Her last HbA1c is 7.0 in 10/24. She has lost about 9 lbs since starting Ozempic 1 mg qw in 09/24.  Her blood glucose has been around 100-140 lately.  She has tried metformin, but had GI discomfort with it as well.  Denies any polyuria or polyphagia currently.  HTN: Her BP is well-controlled.  She takes amlodipine 5 mg QD, losartan 25 mg QD and propranolol 80 mg QAM and 60 mg QPM (mainly for migraine).  She denies any dizziness, chest pain, dyspnea or palpitations currently.  Hydrocephalus: She had revision of VP shunt in 04/24 due to recurrence of hydrocephalus.  She still reports fatigue and weakness.  Her vision has improved now. She had EEG in 01/24, which was unremarkable.  She had NCS/EMG by Dr Terrace Arabia, which showed mild axonal sensorimotor neuropathy.  She is already on gabapentin 600 mh qHS. She has felt progressive leg weakness lately as well, which was also due to recent weight gain and OA of knee.  Past Medical History:  Diagnosis Date   Allergy    Anxiety    Arnold-Chiari malformation (HCC)    Arthritis    Borderline diabetic    Chronic headaches    Colon polyps    nonadenomatous   Depression    Diabetes mellitus without complication (HCC)    Diverticulosis    Esophageal stricture    FROZEN LEFT SHOULDER 04/01/2010   Qualifier: Diagnosis of  By: Romeo Apple MD, Duffy Rhody     GERD (gastroesophageal reflux disease)    HTN (hypertension)     Hyperlipidemia    IBS (irritable bowel syndrome)    Seizures (HCC)    pt not sure when her last seizure was- she says she just spaces out- no shaking but she could not tell me last seizure-  on Oxtellar bid for seizures    Sleep apnea    no cpap now- did use 02 with cpap but has not used either in "years"    Past Surgical History:  Procedure Laterality Date   BREAST BIOPSY Left    cahri decompression  8 and 03/1999   CHOLECYSTECTOMY     COLONOSCOPY     invasive cervical traction  02/2009   left breast-lumpectomy     POLYPECTOMY     shunt put in on right side of brain     SHUNT REPLACEMENT  12/10   TOTAL ABDOMINAL HYSTERECTOMY  Age 21 yrs   TAH & BSO   TUBAL LIGATION     UPPER GASTROINTESTINAL ENDOSCOPY      Family History  Problem Relation Age of Onset   Colon polyps Mother    Breast cancer Mother    Lung cancer Mother    Heart disease Father    Stroke Father    Aortic aneurysm Father    Chiari malformation Daughter    Addison's disease Son  Breast cancer Maternal Aunt    Colon cancer Neg Hx    Esophageal cancer Neg Hx    Rectal cancer Neg Hx    Stomach cancer Neg Hx     Social History   Socioeconomic History   Marital status: Married    Spouse name: Not on file   Number of children: 3   Years of education: Not on file   Highest education level: Some college, no degree  Occupational History   Not on file  Tobacco Use   Smoking status: Former    Current packs/day: 0.00    Types: Cigarettes    Quit date: 09/13/1990    Years since quitting: 32.8    Passive exposure: Never   Smokeless tobacco: Never  Vaping Use   Vaping status: Never Used  Substance and Sexual Activity   Alcohol use: No   Drug use: No   Sexual activity: Not Currently    Birth control/protection: Surgical    Comment: hyst  Other Topics Concern   Not on file  Social History Narrative   Disabled from Chiari malformation/seizures.. Drinks 4-5 cups of tea daily. Does not get regular  exercise. Married for 40 years.   Social Determinants of Health   Financial Resource Strain: Medium Risk (05/17/2023)   Overall Financial Resource Strain (CARDIA)    Difficulty of Paying Living Expenses: Somewhat hard  Food Insecurity: Food Insecurity Present (05/17/2023)   Hunger Vital Sign    Worried About Running Out of Food in the Last Year: Sometimes true    Ran Out of Food in the Last Year: Sometimes true  Transportation Needs: Unmet Transportation Needs (05/17/2023)   PRAPARE - Administrator, Civil Service (Medical): No    Lack of Transportation (Non-Medical): Yes  Physical Activity: Insufficiently Active (05/17/2023)   Exercise Vital Sign    Days of Exercise per Week: 4 days    Minutes of Exercise per Session: 30 min  Stress: Stress Concern Present (05/17/2023)   [employer] of Occupational Health - Occupational Stress Questionnaire    Feeling of Stress : To some extent  Social Connections: Moderately Integrated (05/17/2023)   Social Connection and Isolation Panel [NHANES]    Frequency of Communication with Friends and Family: Three times a week    Frequency of Social Gatherings with Friends and Family: More than three times a week    Attends Religious Services: 1 to 4 times per year    Active Member of [employer] or Organizations: No    Attends Banker Meetings: Never    Marital Status: Married  Catering manager Violence: Not At Risk (12/09/2022)   Humiliation, Afraid, Rape, and Kick questionnaire    Fear of Current or Ex-Partner: No    Emotionally Abused: No    Physically Abused: No    Sexually Abused: No    Outpatient Medications Prior to Visit  Medication Sig Dispense Refill   Accu-Chek Softclix Lancets lancets 1 each by Other route daily. as directed 100 each 3   AIMOVIG 140 MG/ML SOAJ Inject 140 mg into the skin every 30 (thirty) days. 1 mL 11   amLODipine (NORVASC) 5 MG tablet Take 1 tablet (5 mg total) by mouth daily. 90 tablet 1   aspirin EC 81  MG tablet Take 1 tablet by mouth daily.     Blood Glucose Monitoring Suppl DEVI 1 each by Does not apply route in the morning, at noon, and at bedtime. May substitute to any manufacturer covered by  patient's insurance. 1 each 0   cetirizine (ZYRTEC) 10 MG tablet TAKE 1 TABLET EVERY DAY 90 tablet 0   famotidine (PEPCID) 20 MG tablet Take 1 tablet (20 mg total) by mouth daily. 30 tablet 3   gabapentin (NEURONTIN) 300 MG capsule Take 600 mg by mouth at bedtime.     glucose blood (ACCU-CHEK AVIVA PLUS) test strip 1 each by Other route daily. Use as instructed 100 strip 3   naproxen sodium (ALEVE) 220 MG tablet Take 440 mg by mouth daily as needed (for pain).     Omega-3 Fatty Acids (FISH OIL) 1000 MG CAPS Take 2 capsules by mouth daily. (Patient not taking: Reported on 02/24/2023)     ondansetron (ZOFRAN) 4 MG tablet Take 1 tablet (4 mg total) by mouth every 8 (eight) hours as needed for nausea or vomiting. 20 tablet 0   Oxcarbazepine (TRILEPTAL) 300 MG tablet Take 1 tablet (300 mg total) by mouth 2 (two) times daily. 180 tablet 3   polyethylene glycol powder (GLYCOLAX/MIRALAX) 17 GM/SCOOP powder Take 17 g by mouth 2 (two) times daily as needed. 3350 g 1   propranolol (INDERAL) 60 MG tablet TAKE 1 TABLET EVERY MORNING 90 tablet 3   propranolol (INDERAL) 80 MG tablet Take 1 tablet by mouth nightly. To be taken in addition to 60 mg every morning. 90 tablet 3   Semaglutide, 1 MG/DOSE, 4 MG/3ML SOPN Inject 1 mg as directed once a week. 3 mL 3   sennosides-docusate sodium (SENOKOT-S) 8.6-50 MG tablet Take 1 tablet by mouth daily. 30 tablet 2   triamcinolone cream (KENALOG) 0.1 % Apply 1 Application topically 2 (two) times daily. 30 g 0   UNABLE TO FIND Aleve pain relieving lotion-daily     VITAMIN D PO Take 10,000 Units by mouth daily.     atorvastatin (LIPITOR) 20 MG tablet TAKE 1 TABLET EVERY DAY 90 tablet 10   losartan (COZAAR) 25 MG tablet Take 1 tablet (25 mg total) by mouth daily. 45 tablet 0   No  facility-administered medications prior to visit.    Allergies  Allergen Reactions   Cymbalta [Duloxetine Hcl] Itching    Nasal itching   Methocarbamol    Propoxyphene N-Acetaminophen Itching   Sulfa Antibiotics     ROS Review of Systems  Constitutional:  Positive for fatigue. Negative for chills and fever.  HENT:  Negative for congestion, sinus pressure and sinus pain.   Eyes:  Negative for pain and discharge.  Respiratory:  Negative for cough and shortness of breath.   Cardiovascular:  Negative for chest pain and palpitations.  Gastrointestinal:  Positive for constipation. Negative for diarrhea, nausea and vomiting.  Genitourinary:  Negative for frequency and hematuria.  Musculoskeletal:  Positive for arthralgias, back pain and gait problem. Negative for neck pain and neck stiffness.  Skin:  Negative for rash.  Neurological:  Positive for weakness and headaches (Chronic).  Psychiatric/Behavioral:  Negative for agitation and behavioral problems.       Objective:    Physical Exam Vitals reviewed.  Constitutional:      General: She is not in acute distress.    Appearance: She is obese. She is not diaphoretic.  HENT:     Head: Normocephalic and atraumatic.     Nose: Nose normal.     Mouth/Throat:     Mouth: Mucous membranes are moist.  Eyes:     General: No scleral icterus.    Extraocular Movements: Extraocular movements intact.  Cardiovascular:  Rate and Rhythm: Normal rate and regular rhythm.     Heart sounds: Normal heart sounds. No murmur heard. Pulmonary:     Breath sounds: Normal breath sounds. No wheezing or rales.  Musculoskeletal:     Cervical back: Neck supple. No tenderness.     Right lower leg: No edema.     Left lower leg: No edema.  Skin:    General: Skin is warm.     Findings: No rash.  Neurological:     General: No focal deficit present.     Mental Status: She is alert and oriented to person, place, and time.     Sensory: Sensory deficit (b/l  LE) present.     Motor: Weakness (RUE - 4/5, b/l LE - 4/5) present.  Psychiatric:        Mood and Affect: Mood normal.        Behavior: Behavior normal.     BP 138/82 (BP Location: Right Arm, Patient Position: Sitting, Cuff Size: Large)   Pulse 62   Ht 5\' 2"  (1.575 m)   Wt 185 lb 3.2 oz (84 kg)   SpO2 98%   BMI 33.87 kg/m  Wt Readings from Last 3 Encounters:  06/29/23 185 lb 3.2 oz (84 kg)  06/15/23 184 lb 6.4 oz (83.6 kg)  05/18/23 193 lb 6.4 oz (87.7 kg)    Lab Results  Component Value Date   TSH 0.634 02/15/2023   Lab Results  Component Value Date   WBC 4.5 06/23/2023   HGB 13.5 06/23/2023   HCT 39.6 06/23/2023   MCV 92 06/23/2023   PLT 223 06/23/2023   Lab Results  Component Value Date   NA 137 06/23/2023   K 4.0 06/23/2023   CO2 26 06/23/2023   GLUCOSE 135 (H) 06/23/2023   BUN 5 (L) 06/23/2023   CREATININE 0.78 06/23/2023   BILITOT 0.3 02/15/2023   ALKPHOS 135 (H) 02/15/2023   AST 16 02/15/2023   ALT 16 02/15/2023   PROT 6.8 02/15/2023   ALBUMIN 4.6 02/15/2023   CALCIUM 9.6 06/23/2023   ANIONGAP 8 03/25/2021   EGFR 87 06/23/2023   Lab Results  Component Value Date   CHOL 179 02/15/2023   Lab Results  Component Value Date   HDL 56 02/15/2023   Lab Results  Component Value Date   LDLCALC 97 02/15/2023   Lab Results  Component Value Date   TRIG 147 02/15/2023   Lab Results  Component Value Date   CHOLHDL 3.2 02/15/2023   Lab Results  Component Value Date   HGBA1C 7.0 (H) 06/23/2023      Assessment & Plan:   Problem List Items Addressed This Visit       Cardiovascular and Mediastinum   Essential hypertension - Primary    BP: 138/82   Well-controlled with Amlodipine 5 mg once daily,  Losartan 25 mg QD and Propranolol 80 mg + 60 mg QD now Counseled for compliance with the medications Advised DASH diet and moderate exercise/walking, at least 150 mins/week      Relevant Medications   atorvastatin (LIPITOR) 20 MG tablet    losartan (COZAAR) 25 MG tablet     Endocrine   DM type 2 with diabetic peripheral neuropathy (HCC)    Lab Results  Component Value Date   HGBA1C 7.0 (H) 06/23/2023   Associated with HTN and HLD Uncontrolled, but glycemic profile improving since starting Ozempic in 09/24 - agrees to follow low carb diet and small, frequent meals to  avoid nausea; Zofran PRN for nausea Plan to increase dose of Ozempic later Advised to follow diabetic diet On statin F/u CMP and lipid panel Diabetic eye exam: Advised to follow up with Ophthalmology for diabetic eye exam  On gabapentin 600 mg qHS for neuropathy      Relevant Medications   atorvastatin (LIPITOR) 20 MG tablet   losartan (COZAAR) 25 MG tablet   Other Relevant Orders   CMP14+EGFR   Hemoglobin A1c   Hyperlipidemia associated with type 2 diabetes mellitus (HCC)    On statin Check lipid profile      Relevant Medications   atorvastatin (LIPITOR) 20 MG tablet   losartan (COZAAR) 25 MG tablet   Other Relevant Orders   Lipid Profile     Other   S/P VP shunt (Chronic)    Followed by neurosurgery at [facility] Unclear if her current symptoms are due to hydrocephalus      Morbid obesity (HCC)    BMI Readings from Last 3 Encounters:  06/29/23 33.87 kg/m  06/15/23 33.73 kg/m  05/18/23 35.37 kg/m   Associated with type II DM, HLD and HTN Diet modification and moderate exercise/walking as tolerated On GLP-1 agonist therapy for type II DM - has lost 9 lbs in 6 weeks        Meds ordered this encounter  Medications   atorvastatin (LIPITOR) 20 MG tablet    Sig: Take 1 tablet (20 mg total) by mouth daily.    Dispense:  90 tablet    Refill:  3   losartan (COZAAR) 25 MG tablet    Sig: Take 1 tablet (25 mg total) by mouth daily.    Dispense:  90 tablet    Refill:  1    Follow-up: Return in about 4 months (around 10/30/2023) for DM and HTN.    Anabel Halon, MD

## 2023-06-29 NOTE — Assessment & Plan Note (Signed)
On statin Check lipid profile

## 2023-06-29 NOTE — Assessment & Plan Note (Signed)
Followed by neurosurgery at [facility] Unclear if her current symptoms are due to hydrocephalus

## 2023-06-29 NOTE — Assessment & Plan Note (Deleted)

## 2023-06-29 NOTE — Assessment & Plan Note (Addendum)
BP: 138/82   Well-controlled with Amlodipine 5 mg once daily,  Losartan 25 mg QD and Propranolol 80 mg + 60 mg QD now Counseled for compliance with the medications Advised DASH diet and moderate exercise/walking, at least 150 mins/week

## 2023-06-29 NOTE — Assessment & Plan Note (Addendum)
Lab Results  Component Value Date   HGBA1C 7.0 (H) 06/23/2023   Associated with HTN and HLD Uncontrolled, but glycemic profile improving since starting Ozempic in 09/24 - agrees to follow low carb diet and small, frequent meals to avoid nausea; Zofran PRN for nausea Plan to increase dose of Ozempic later Advised to follow diabetic diet On statin F/u CMP and lipid panel Diabetic eye exam: Advised to follow up with Ophthalmology for diabetic eye exam  On gabapentin 600 mg qHS for neuropathy

## 2023-06-29 NOTE — Assessment & Plan Note (Signed)
BMI Readings from Last 3 Encounters:  06/29/23 33.87 kg/m  06/15/23 33.73 kg/m  05/18/23 35.37 kg/m   Associated with type II DM, HLD and HTN Diet modification and moderate exercise/walking as tolerated On GLP-1 agonist therapy for type II DM - has lost 9 lbs in 6 weeks

## 2023-06-29 NOTE — Patient Instructions (Signed)
Please continue to take medications as prescribed. ? ?Please continue to follow low carb diet and perform moderate exercise/walking at least 150 mins/week. ?

## 2023-07-01 ENCOUNTER — Encounter: Payer: Self-pay | Admitting: Cardiology

## 2023-07-06 ENCOUNTER — Other Ambulatory Visit: Payer: Self-pay

## 2023-07-06 ENCOUNTER — Telehealth: Payer: Self-pay | Admitting: Internal Medicine

## 2023-07-06 DIAGNOSIS — E1142 Type 2 diabetes mellitus with diabetic polyneuropathy: Secondary | ICD-10-CM

## 2023-07-06 MED ORDER — SEMAGLUTIDE (1 MG/DOSE) 4 MG/3ML ~~LOC~~ SOPN
1.0000 mg | PEN_INJECTOR | SUBCUTANEOUS | 3 refills | Status: DC
Start: 2023-07-06 — End: 2023-12-19

## 2023-07-06 NOTE — Telephone Encounter (Signed)
[facility] calling needing a new order for pt Ozempic 1mg . Ref # [id number] call back # [phone] Thanks

## 2023-07-06 NOTE — Telephone Encounter (Signed)
Refill sent.

## 2023-07-08 ENCOUNTER — Other Ambulatory Visit: Payer: Self-pay | Admitting: Internal Medicine

## 2023-07-08 DIAGNOSIS — I10 Essential (primary) hypertension: Secondary | ICD-10-CM

## 2023-07-11 ENCOUNTER — Telehealth: Payer: Self-pay | Admitting: Cardiology

## 2023-07-11 NOTE — Telephone Encounter (Signed)
Advised that medication refills sent to pharmacy from our office according to her medication profile is to take propranolol 60 mg in the morning and 80 mg at night. Spoke with patient and she read instructions on her propranolol bottles that says to take propranolol 60 mg in the morning and 80 mg at night. Says she apologize that she didn't read it correctly. Advised that its okay and we wanted to be sure she had the correct instructions. Verbalized understanding.

## 2023-07-11 NOTE — Telephone Encounter (Signed)
*  STAT* If patient is at the pharmacy, call can be transferred to refill team.   1. Which medications need to be refilled? (please list name of each medication and dose if known)   propranolol (INDERAL) 60 MG tablet   2. Would you like to learn more about the convenience, safety, & potential cost savings by using the [facility]?   3. Are you open to using the [facility] (Type [facility]. ).  4. Which pharmacy/location (including street and city if local pharmacy) is medication to be sent to?  [facility] - [place], [place] - [address]   5. Do they need a 30 day or 90 day supply?   90 day  Patient stated she still has some of this medication left.  Patient stated she is prescribed this medication to be taken 1 tablet every morning.  Patient noted she is also prescribed propranolol (INDERAL) 80 MG tablet to be taken 1 tablet by mouth at night.

## 2023-07-14 NOTE — Progress Notes (Signed)
Established Patient Office Visit   Subjective  Patient ID: Amanda Webster, female    DOB: November 26, 1960  Age: 62 y.o. MRN: [id number]  Chief Complaint  Patient presents with   Follow-up    Check for vertigo    She  has a past medical history of Allergy, Anxiety, Arnold-Chiari malformation (HCC), Arthritis, Borderline diabetic, Chronic headaches, Colon polyps, Depression, Diabetes mellitus without complication (HCC), Diverticulosis, Esophageal stricture, FROZEN LEFT SHOULDER (04/01/2010), GERD (gastroesophageal reflux disease), HTN (hypertension), Hyperlipidemia, IBS (irritable bowel syndrome), Seizures (HCC), and Sleep apnea.  Dizziness This is a recurring issue, with the current episode beginning over a year ago and occurring intermittently. Recently, the symptoms have been worsening more rapidly. Associated symptoms include chills, sweating, fatigue, headaches, vertigo, visual changes, and weakness. Notably, there is no abdominal pain, chest pain, fever, or numbness. Additional symptoms reported are tinnitus and nausea. Symptoms worsen with activities such as walking, standing, and exertion. She has attempted to alleviate symptoms through relaxation, rest, and lying down, which provided only mild relief.    Review of Systems  Constitutional:  Negative for chills and fever.  HENT:  Positive for tinnitus.   Respiratory:  Negative for shortness of breath.   Cardiovascular:  Negative for chest pain.  Gastrointestinal:  Negative for abdominal pain.  Neurological:  Positive for dizziness and headaches.      Objective:     BP 116/74   Pulse 69   Ht 5\' 2"  (1.575 m)   Wt 182 lb (82.6 kg)   SpO2 92%   BMI 33.29 kg/m  BP Readings from Last 3 Encounters:  07/15/23 116/74  06/29/23 138/82  06/15/23 109/87      Physical Exam Vitals reviewed.  Constitutional:      General: She is not in acute distress.    Appearance: Normal appearance. She is not ill-appearing, toxic-appearing or  diaphoretic.  HENT:     Head: Normocephalic.     Right Ear: Tympanic membrane normal.     Left Ear: Tympanic membrane normal.  Eyes:     General:        Right eye: No discharge.        Left eye: No discharge.     Conjunctiva/sclera: Conjunctivae normal.     Pupils: Pupils are equal, round, and reactive to light.  Cardiovascular:     Pulses: Normal pulses.     Heart sounds: Normal heart sounds.  Pulmonary:     Effort: Pulmonary effort is normal. No respiratory distress.     Breath sounds: Normal breath sounds.  Skin:    General: Skin is warm and dry.     Capillary Refill: Capillary refill takes less than 2 seconds.  Neurological:     Mental Status: She is alert.     Coordination: Coordination normal.     Gait: Gait normal.  Psychiatric:        Mood and Affect: Mood normal.      No results found for any visits on 07/15/23.  The 10-year ASCVD risk score (Arnett DK, et al., 2019) is: 6.3%    Assessment & Plan:  Dizziness -     Vitamin B12 -     CBC with Differential/Platelet -     BMP8+eGFR -     Meclizine HCl; Take 1 tablet (12.5 mg total) by mouth 2 (two) times daily as needed for dizziness.  Dispense: 30 tablet; Refill: 0 -     Iron, TIBC and Ferritin Panel -  Ambulatory referral to Audiology  Vertigo Assessment & Plan: Trial on meclizine12.5 PRN Labs ordered to rule out deficiency, BMP, CBC, Thyroid panel, Iron panel, Vit B12, Vit D Referral placed to Audiology Testing to evaluate inner ear function, as many vertigo cases are related to vestibular issues. Discussed , sleep with your head slightly elevated and rise slowly from bed in the morning to prevent sudden dizziness. Limiting salt, caffeine, and alcohol may also reduce fluid buildup in the inner ear, which can lessen vertigo episodes. Avoid positions or activities that trigger symptoms, such as bending over or quickly turning your head. Additionally, focus on slow, steady movements        Return if  symptoms worsen or fail to improve.   Cruzita Lederer Newman Nip, FNP

## 2023-07-14 NOTE — Patient Instructions (Signed)

## 2023-07-15 ENCOUNTER — Encounter: Payer: Self-pay | Admitting: Family Medicine

## 2023-07-15 ENCOUNTER — Ambulatory Visit: Payer: Medicare PPO | Admitting: Family Medicine

## 2023-07-15 VITALS — BP 116/74 | HR 69 | Ht 62.0 in | Wt 182.0 lb

## 2023-07-15 DIAGNOSIS — R42 Dizziness and giddiness: Secondary | ICD-10-CM | POA: Diagnosis not present

## 2023-07-15 MED ORDER — MECLIZINE HCL 12.5 MG PO TABS: 12.5000 mg | ORAL_TABLET | Freq: Two times a day (BID) | ORAL | 0 refills | Status: DC | PRN

## 2023-07-15 NOTE — Assessment & Plan Note (Addendum)
Trial on meclizine12.5 PRN Labs ordered to rule out deficiency, BMP, CBC, Thyroid panel, Iron panel, Vit B12, Vit D Referral placed to Audiology Testing to evaluate inner ear function, as many vertigo cases are related to vestibular issues. Discussed , sleep with your head slightly elevated and rise slowly from bed in the morning to prevent sudden dizziness. Limiting salt, caffeine, and alcohol may also reduce fluid buildup in the inner ear, which can lessen vertigo episodes. Avoid positions or activities that trigger symptoms, such as bending over or quickly turning your head. Additionally, focus on slow, steady movements

## 2023-07-18 DIAGNOSIS — R42 Dizziness and giddiness: Secondary | ICD-10-CM | POA: Diagnosis not present

## 2023-07-19 ENCOUNTER — Other Ambulatory Visit: Payer: Self-pay | Admitting: Family Medicine

## 2023-07-19 LAB — CBC WITH DIFFERENTIAL/PLATELET
Basophils Absolute: 0 10*3/uL (ref 0.0–0.2)
Basos: 1 %
EOS (ABSOLUTE): 0.1 10*3/uL (ref 0.0–0.4)
Eos: 2 %
Hematocrit: 40.9 % (ref 34.0–46.6)
Hemoglobin: 14.2 g/dL (ref 11.1–15.9)
Immature Grans (Abs): 0 10*3/uL (ref 0.0–0.1)
Immature Granulocytes: 0 %
Lymphocytes Absolute: 1.6 10*3/uL (ref 0.7–3.1)
Lymphs: 30 %
MCH: 31.5 pg (ref 26.6–33.0)
MCHC: 34.7 g/dL (ref 31.5–35.7)
MCV: 91 fL (ref 79–97)
Monocytes Absolute: 0.5 10*3/uL (ref 0.1–0.9)
Monocytes: 10 %
Neutrophils Absolute: 3.1 10*3/uL (ref 1.4–7.0)
Neutrophils: 57 %
Platelets: 208 10*3/uL (ref 150–450)
RBC: 4.51 x10E6/uL (ref 3.77–5.28)
RDW: 12.2 % (ref 11.7–15.4)
WBC: 5.4 10*3/uL (ref 3.4–10.8)

## 2023-07-19 LAB — BMP8+EGFR
BUN/Creatinine Ratio: 10 — ABNORMAL LOW (ref 12–28)
BUN: 8 mg/dL (ref 8–27)
CO2: 27 mmol/L (ref 20–29)
Calcium: 9.3 mg/dL (ref 8.7–10.3)
Chloride: 97 mmol/L (ref 96–106)
Creatinine, Ser: 0.79 mg/dL (ref 0.57–1.00)
Glucose: 124 mg/dL — ABNORMAL HIGH (ref 70–99)
Potassium: 3.8 mmol/L (ref 3.5–5.2)
Sodium: 140 mmol/L (ref 134–144)
eGFR: 86 mL/min/{1.73_m2} (ref >59)

## 2023-07-19 LAB — IRON,TIBC AND FERRITIN PANEL
Ferritin: 165 ng/mL — ABNORMAL HIGH (ref 15–150)
Iron Saturation: 29 % (ref 15–55)
Iron: 72 ug/dL (ref 27–159)
Total Iron Binding Capacity: 248 ug/dL — ABNORMAL LOW (ref 250–450)
UIBC: 176 ug/dL (ref 131–425)

## 2023-07-19 LAB — VITAMIN B12: Vitamin B-12: 178 pg/mL — ABNORMAL LOW (ref 232–1245)

## 2023-07-19 MED ORDER — CYANOCOBALAMIN 1000 MCG/ML IJ SOLN: 1000.0000 ug | INTRAMUSCULAR | 0 refills | Status: DC

## 2023-07-22 ENCOUNTER — Telehealth: Payer: Self-pay | Admitting: Internal Medicine

## 2023-07-22 NOTE — Telephone Encounter (Signed)
Copied from CRM [id number]. Topic: Clinical - Prescription Issue >> Jul 22, 2023  3:38 PM Ros[place] Nickel wrote:  Reason for CRM: Patient regarding Cyanocobalamin was order patient needs instruction on how to take the medicine, and also does not have any syringe or needles contact # [phone]

## 2023-07-24 ENCOUNTER — Encounter: Payer: Self-pay | Admitting: Family Medicine

## 2023-07-25 NOTE — Telephone Encounter (Signed)
Spoke to patient

## 2023-08-10 ENCOUNTER — Ambulatory Visit (INDEPENDENT_AMBULATORY_CARE_PROVIDER_SITE_OTHER): Payer: Medicare PPO | Admitting: Clinical

## 2023-08-10 DIAGNOSIS — F332 Major depressive disorder, recurrent severe without psychotic features: Secondary | ICD-10-CM | POA: Diagnosis not present

## 2023-08-10 NOTE — Progress Notes (Signed)
IN PERSON    I connected with Amanda Webster on 08/10/23 at  11:00 AM ESTin person  and verified that I am speaking with the correct person using two identifiers.   Location: Patient: Office  Provider: Office   I discussed the limitations of evaluation and management by telemedicine and the availability of in person appointments. The patient expressed understanding and agreed to proceed. ( IN PERSON)      THERAPIST PROGRESS NOTE   Session Time: 11:00AM-11:45AM   Participation Level: Active   Behavioral Response: CasualAlertDepressed   Type of Therapy: Individual Therapy   Treatment Goals addressed: Coping   Interventions: CBT, Motivational Interviewing and Strength-based   Summary: Amanda Webster is a 62 y.o. female who presents with Depression. The OPT therapist utilized Motivational Interviewing to assist in continuing therapeutic repore. The patient in the session was engaged and work in collaboration giving feedback about her triggers and symptoms over the past few weeks. The patient spoke about her recently seeing her PA at her Dr. Isidore Moos and starting a medication injection for B12.  The patient spoke about the impact of caregiving for her elderly Mother who struggles with Dementia. The OPT therapist utilized Cognitive Behavioral Therapy through cognitive restructuring as well as worked with the patient on coping strategies to assist in management of her mental health symptoms. The patient noted that she is still working to stay active within her health limitations. The patient spoke about plans for upcoming Thanksgiving holiday and will be involved with family in doing a walk run event tomorrow morning. The OPT therapist overviewed self checkin and adding positive affirmations. The patient will continue to utilize her existing supports and maintain compliance with her health directives.   Suicidal/Homicidal: Nowithout intent/plan   Therapist Response: The OPT therapist worked  with the patient for the patients session. The patient was engaged in her session and gave feedback in relation to triggers, symptoms, and behavior responses over the past few weeks. The patient overviewed working on her awareness of the impact of stress from caregiving for her elderly Mother who suffers from Dementia and using coping. The OPT therapist worked with the patient utilizing an in session Cognitive Behavioral Therapy exercise. The patient was responsive in the session and acknowledged her work in implementing coping skills/ distractions as well as utilizing positive thinking and staying active within her physical health limits and indicated looking forward to being active over the next few days with involvement with her family during the Thanksgiving holiday in a run/walk event tomorrow morning. The OPT therapist will continue treatment work with the patient in her next scheduled session   Plan: Follow-up in 2/3 weeks   Diagnosis:      Axis I:Major depressive disorder, recurrent severe without psychotic features                           Axis II: No diagnosis   I discussed the assessment and treatment plan with the patient. The patient was provided an opportunity to ask questions and all were answered. The patient agreed with the plan and demonstrated an understanding of the instructions.   The patient was advised to call back or seek an in-person evaluation if the symptoms worsen or if the condition fails to improve as anticipated.   I provided 45 minutes of face-to-face time during this encounter   Winfred Burn, [place]   08/10/2023

## 2023-08-22 ENCOUNTER — Ambulatory Visit: Payer: Medicare PPO | Attending: Family Medicine | Admitting: Audiologist

## 2023-08-22 ENCOUNTER — Ambulatory Visit: Payer: Medicare PPO | Attending: Cardiology | Admitting: Cardiology

## 2023-08-22 ENCOUNTER — Encounter: Payer: Self-pay | Admitting: Cardiology

## 2023-08-22 VITALS — BP 132/84 | HR 70 | Ht 62.0 in | Wt 176.0 lb

## 2023-08-22 DIAGNOSIS — R42 Dizziness and giddiness: Secondary | ICD-10-CM | POA: Insufficient documentation

## 2023-08-22 DIAGNOSIS — E782 Mixed hyperlipidemia: Secondary | ICD-10-CM | POA: Diagnosis not present

## 2023-08-22 DIAGNOSIS — I10 Essential (primary) hypertension: Secondary | ICD-10-CM

## 2023-08-22 DIAGNOSIS — I2510 Atherosclerotic heart disease of native coronary artery without angina pectoris: Secondary | ICD-10-CM

## 2023-08-22 DIAGNOSIS — H903 Sensorineural hearing loss, bilateral: Secondary | ICD-10-CM | POA: Diagnosis not present

## 2023-08-22 DIAGNOSIS — R0789 Other chest pain: Secondary | ICD-10-CM

## 2023-08-22 DIAGNOSIS — R002 Palpitations: Secondary | ICD-10-CM

## 2023-08-22 MED ORDER — PANTOPRAZOLE SODIUM 40 MG PO TBEC: 40.0000 mg | DELAYED_RELEASE_TABLET | Freq: Every day | ORAL | 6 refills | Status: DC

## 2023-08-22 NOTE — Patient Instructions (Signed)
Medication Instructions:   Stop Pepcid (Famotidine) Begin Protonix 40mg  daily  Continue all other medications.     Labwork:  none  Testing/Procedures:  none  Follow-Up:  6 months   Any Other Special Instructions Will Be Listed Below (If Applicable).   If you need a refill on your cardiac medications before your next appointment, please call your pharmacy.

## 2023-08-22 NOTE — Progress Notes (Signed)
Clinical Summary Ms. Lincks is a 62 y.o.female seen today for follow up of the following medical problems.    1. Chest pain   - normal stress test 08/2016   06/2022: coronary Ca score 3, 69th percentile, mild nonobstructive CAD - some pressing like feeling in midchest, only happens when laying flat at night      2.Palpitaitons 10/2020 event monitor: rare ectopy isolated 4 beat run of SVT - no recent symptoms - compliant with propranolol, which she is also on for migraines.      3. OSA - does not wear cpap due to discomfort       4. HTN - she is compliantwith meds   5. Hyperlipidemia -06/2021 TC 169 TG 160 HDL 51 LDL 90 - 02/2023 TC 409 TG 811 HDL 56 LDL 97   6. LE edema  Reprots recenty bilateral LE edema, mild DOE  01/2022 echo: LVEF 60-65%, no WMAs, normal diastolic function, normal RV. Mild MR.         Past Medical History:  Diagnosis Date   Allergy    Anxiety    Arnold-Chiari malformation (HCC)    Arthritis    Borderline diabetic    Chronic headaches    Colon polyps    nonadenomatous   Depression    Diabetes mellitus without complication (HCC)    Diverticulosis    Esophageal stricture    FROZEN LEFT SHOULDER 04/01/2010   Qualifier: Diagnosis of  By: Romeo Apple MD, Duffy Rhody     GERD (gastroesophageal reflux disease)    HTN (hypertension)    Hyperlipidemia    IBS (irritable bowel syndrome)    Seizures (HCC)    pt not sure when her last seizure was- she says she just spaces out- no shaking but she could not tell me last seizure-  on Oxtellar bid for seizures    Sleep apnea    no cpap now- did use 02 with cpap but has not used either in "years"     Allergies  Allergen Reactions   Cymbalta [Duloxetine Hcl] Itching    Nasal itching   Methocarbamol    Propoxyphene N-Acetaminophen Itching   Sulfa Antibiotics      Current Outpatient Medications  Medication Sig Dispense Refill   Accu-Chek Softclix Lancets lancets 1 each by Other route daily. as  directed 100 each 3   AIMOVIG 140 MG/ML SOAJ Inject 140 mg into the skin every 30 (thirty) days. 1 mL 11   amLODipine (NORVASC) 5 MG tablet Take 1 tablet (5 mg total) by mouth daily. 90 tablet 1   aspirin EC 81 MG tablet Take 1 tablet by mouth daily.     atorvastatin (LIPITOR) 20 MG tablet Take 1 tablet (20 mg total) by mouth daily. 90 tablet 3   Blood Glucose Monitoring Suppl DEVI 1 each by Does not apply route in the morning, at noon, and at bedtime. May substitute to any manufacturer covered by patient's insurance. 1 each 0   cetirizine (ZYRTEC) 10 MG tablet TAKE 1 TABLET EVERY DAY 90 tablet 0   cyanocobalamin (VITAMIN B12) 1000 MCG/ML injection Inject 1 mL (1,000 mcg total) into the skin once a week. 10 mL 0   famotidine (PEPCID) 20 MG tablet Take 1 tablet (20 mg total) by mouth daily. 30 tablet 3   gabapentin (NEURONTIN) 300 MG capsule Take 600 mg by mouth at bedtime.     glucose blood (ACCU-CHEK AVIVA PLUS) test strip 1 each by Other route daily.  Use as instructed 100 strip 3   losartan (COZAAR) 25 MG tablet TAKE 1 TABLET(25 MG) BY MOUTH DAILY 45 tablet 0   meclizine (ANTIVERT) 12.5 MG tablet Take 1 tablet (12.5 mg total) by mouth 2 (two) times daily as needed for dizziness. 30 tablet 0   naproxen sodium (ALEVE) 220 MG tablet Take 440 mg by mouth daily as needed (for pain).     Omega-3 Fatty Acids (FISH OIL) 1000 MG CAPS Take 2 capsules by mouth daily.     ondansetron (ZOFRAN) 4 MG tablet Take 1 tablet (4 mg total) by mouth every 8 (eight) hours as needed for nausea or vomiting. 20 tablet 0   Oxcarbazepine (TRILEPTAL) 300 MG tablet Take 1 tablet (300 mg total) by mouth 2 (two) times daily. 180 tablet 3   polyethylene glycol powder (GLYCOLAX/MIRALAX) 17 GM/SCOOP powder Take 17 g by mouth 2 (two) times daily as needed. 3350 g 1   propranolol (INDERAL) 60 MG tablet TAKE 1 TABLET EVERY MORNING 90 tablet 3   propranolol (INDERAL) 80 MG tablet Take 1 tablet by mouth nightly. To be taken in  addition to 60 mg every morning. 90 tablet 3   Semaglutide, 1 MG/DOSE, 4 MG/3ML SOPN Inject 1 mg as directed once a week. 3 mL 3   sennosides-docusate sodium (SENOKOT-S) 8.6-50 MG tablet Take 1 tablet by mouth daily. 30 tablet 2   triamcinolone cream (KENALOG) 0.1 % Apply 1 Application topically 2 (two) times daily. 30 g 0   UNABLE TO FIND Aleve pain relieving lotion-daily     VITAMIN D PO Take 10,000 Units by mouth daily.     No current facility-administered medications for this visit.     Past Surgical History:  Procedure Laterality Date   BREAST BIOPSY Left    cahri decompression  8 and 03/1999   CHOLECYSTECTOMY     COLONOSCOPY     invasive cervical traction  02/2009   left breast-lumpectomy     POLYPECTOMY     shunt put in on right side of brain     SHUNT REPLACEMENT  12/10   TOTAL ABDOMINAL HYSTERECTOMY  Age 31 yrs   TAH & BSO   TUBAL LIGATION     UPPER GASTROINTESTINAL ENDOSCOPY       Allergies  Allergen Reactions   Cymbalta [Duloxetine Hcl] Itching    Nasal itching   Methocarbamol    Propoxyphene N-Acetaminophen Itching   Sulfa Antibiotics       Family History  Problem Relation Age of Onset   Colon polyps Mother    Breast cancer Mother    Lung cancer Mother    Heart disease Father    Stroke Father    Aortic aneurysm Father    Chiari malformation Daughter    Addison's disease Son    Breast cancer Maternal Aunt    Colon cancer Neg Hx    Esophageal cancer Neg Hx    Rectal cancer Neg Hx    Stomach cancer Neg Hx      Social History Ms. Picchi reports that she quit smoking about 32 years ago. Her smoking use included cigarettes. She has never been exposed to tobacco smoke. She has never used smokeless tobacco. Ms. Kocinski reports no history of alcohol use.   Review of Systems CONSTITUTIONAL: No weight loss, fever, chills, weakness or fatigue.  HEENT: Eyes: No visual loss, blurred vision, double vision or yellow sclerae.No hearing loss, sneezing,  congestion, runny nose or sore throat.  SKIN: No rash  or itching.  CARDIOVASCULAR: per hpi RESPIRATORY: No shortness of breath, cough or sputum.  GASTROINTESTINAL: No anorexia, nausea, vomiting or diarrhea. No abdominal pain or blood.  GENITOURINARY: No burning on urination, no polyuria NEUROLOGICAL: No headache, dizziness, syncope, paralysis, ataxia, numbness or tingling in the extremities. No change in bowel or bladder control.  MUSCULOSKELETAL: No muscle, back pain, joint pain or stiffness.  LYMPHATICS: No enlarged nodes. No history of splenectomy.  PSYCHIATRIC: No history of depression or anxiety.  ENDOCRINOLOGIC: No reports of sweating, cold or heat intolerance. No polyuria or polydipsia.  Marland Kitchen   Physical Examination Today's Vitals   08/22/23 1153  BP: 132/84  Pulse: 70  SpO2: 98%  Weight: 176 lb (79.8 kg)  Height: 5\' 2"  (1.575 m)   Body mass index is 32.19 kg/m.  Gen: resting comfortably, no acute distress HEENT: no scleral icterus, pupils equal round and reactive, no palptable cervical adenopathy,  CV: RRR, no m/r,g no jvd Resp: Clear to auscultation bilaterally GI: abdomen is soft, non-tender, non-distended, normal bowel sounds, no hepatosplenomegaly MSK: extremities are warm, no edema.  Skin: warm, no rash Neuro:  no focal deficits Psych: appropriate affect   Diagnostic Studies  08/2016 Nuclear stress test No diagnostic ST segment changes to indicate ischemia. No significant myocardial perfusion defects to indicate scar or ischemia. This is a low risk study. Nuclear stress EF: 80%.   10/2020 monitor 7 day monitor No reported patient symptoms or triggered events Rare supraventricular and ventricular ectopy. Isolated 4 beat run of SVT     Patch Wear Time:  7 days and 2 hours (2022-02-02T15:09:51-0500 to 2022-02-09T17:54:08-499)   Patient had a min HR of 48 bpm, max HR of 114 bpm, and avg HR of 69 bpm. Predominant underlying rhythm was Sinus Rhythm. 1 run of  Supraventricular Tachycardia occurred lasting 4 beats with a max rate of 114 bpm (avg 111 bpm). Isolated SVEs were rare  (<1.0%), SVE Couplets were rare (<1.0%), and SVE Triplets were rare (<1.0%). Isolated VEs were rare (<1.0%), VE Couplets were rare (<1.0%), and no VE Triplets were present.      01/2022 echo  1. Left ventricular ejection fraction, by estimation, is 60 to 65%. The  left ventricle has normal function. The left ventricle has no regional  wall motion abnormalities. Left ventricular diastolic parameters were  normal.   2. Right ventricular systolic function is normal. The right ventricular  size is normal. There is normal pulmonary artery systolic pressure. The  estimated right ventricular systolic pressure is 26.0 mmHg.   3. The mitral valve is grossly normal. Mild mitral valve regurgitation.   4. The aortic valve is tricuspid. There is mild calcification of the  aortic valve. Aortic valve regurgitation is not visualized. Aortic valve  sclerosis is present, with no evidence of aortic valve stenosis. Aortic  valve mean gradient measures 2.5 mmHg.   5. The inferior vena cava is normal in size with greater than 50%  respiratory variability, suggesting right atrial pressure of 3 mmHg.     06/2022 coronary CTA IMPRESSION: 1. Coronary artery calcium score 3.36 Agatston units. This places the patient in the 69th percentile for age and gender, suggesting intermediate risk for future cardiac events.   2.  Mild nonobstructive CAD.  Assessment and Plan   1. Chest pain - noncardiac rest pain while laying in bed at night, chronic indigestion - try changing pepcid to protonix 40mg  daily - recent coronary CTA without significant coronary disease.    2. HTN -  she is at goal, continue current meds  3. Palpitations - controlled with propanolol which she is also on for history of migraines  4. HLD - at goal, continue current meds    Antoine Poche, M.D.,

## 2023-08-22 NOTE — Procedures (Signed)
  Outpatient Audiology and [facility] [address] [place], [place]  [address] [phone]  AUDIOLOGICAL  EVALUATION  NAME: Amanda Webster     DOB:   02/15/1961      MRN: [id number]                                                                                     DATE: 08/22/2023     REFERENT: Anabel Halon, MD STATUS: Outpatient DIAGNOSIS: Dizziness, Sensorineural Hearing Loss   History: Amanda Webster was seen for an audiological evaluation due to vertigo and hearing loss. She feels unstable on her feet and has been using a cane. She 'walks like she is drunk' all day. She was under the impression today's test was for vestibular testing. [name] has tried hearing aids in the past. She is aware she has hearing loss.  Amanda Webster denies pain or pressure. She has a static sounding tinnitus in both ears which is bothersome.  Amanda Webster no history of hazardous noise exposure.  Medical history shows no other risk for hearing loss.    Evaluation:  Otoscopy showed a clear view of the tympanic membranes, bilaterally Tympanometry results were consistent with normal middle ear function, bilaterally   Audiometric testing was completed using Conventional Audiometry techniques with insert earphones and supraural headphones. Test results are consistent with moderate to moderately severe sloping sensorineural  hearing loss bilaterally. Speech Recognition Thresholds were obtained at  45dB HL in the right ear and at 40dB HL in the left ear. Word Recognition Testing was completed at  40dB SL and Amanda Webster scored 100% in each ear.    Results:  The test results were reviewed with Amanda Webster. Amanda Webster needs hearing aids. She has a moderate to moderately severe sloping sensorineural hearing loss. The loss is symmetric. There is no evidence from testing today of a middle ear disorder or retrocochlear pathology. Still recommend she be seen by a vestibular audiologist at the [facility] ENT office. That office also  dispenses hearing aids. Audiogram printed and provided to Amanda Webster.    Recommendations: Hearing aids recommended for both ears. Patient given list of local hearing aid providers.  Annual audiometric testing recommended to monitor hearing loss for progression.  Recommend referral to [facility] ENT for vestibular evaluation and hearing aid consultation.    32 minutes spent testing and counseling on results.   If you have any questions please feel free to contact me at [phone].  Ammie Ferrier Au.D.  Audiologist   08/22/2023  2:25 PM  Cc: Anabel Halon, MD

## 2023-08-23 ENCOUNTER — Ambulatory Visit: Payer: Medicare PPO | Admitting: Cardiology

## 2023-08-24 ENCOUNTER — Other Ambulatory Visit: Payer: Self-pay | Admitting: Internal Medicine

## 2023-08-24 DIAGNOSIS — E119 Type 2 diabetes mellitus without complications: Secondary | ICD-10-CM

## 2023-08-28 ENCOUNTER — Other Ambulatory Visit: Payer: Self-pay | Admitting: Internal Medicine

## 2023-08-28 DIAGNOSIS — H906 Mixed conductive and sensorineural hearing loss, bilateral: Secondary | ICD-10-CM

## 2023-08-31 DIAGNOSIS — E119 Type 2 diabetes mellitus without complications: Secondary | ICD-10-CM | POA: Diagnosis not present

## 2023-08-31 LAB — HM DIABETES EYE EXAM

## 2023-09-27 ENCOUNTER — Other Ambulatory Visit: Payer: Self-pay | Admitting: Family Medicine

## 2023-09-28 ENCOUNTER — Ambulatory Visit (INDEPENDENT_AMBULATORY_CARE_PROVIDER_SITE_OTHER): Payer: Medicare PPO | Admitting: Clinical

## 2023-09-28 DIAGNOSIS — F332 Major depressive disorder, recurrent severe without psychotic features: Secondary | ICD-10-CM

## 2023-09-28 NOTE — Progress Notes (Signed)
 IN PERSON    I connected with Amanda Webster on 09/28/23 at  11:00 AM ESTin person  and verified that I am speaking with the correct person using two identifiers.   Location: Patient: Office  Provider: Office   I discussed the limitations of evaluation and management by telemedicine and the availability of in person appointments. The patient expressed understanding and agreed to proceed. ( IN PERSON)      THERAPIST PROGRESS NOTE   Session Time: 11:00AM-11:45AM   Participation Level: Active   Behavioral Response: CasualAlertDepressed   Type of Therapy: Individual Therapy   Treatment Goals addressed: Coping   Interventions: CBT, Motivational Interviewing and Strength-based   Summary: Amanda Webster is a 63 y.o. female who presents with Depression. The OPT therapist utilized Motivational Interviewing to assist in continuing therapeutic repore. The patient in the session was engaged and work in collaboration giving feedback about her triggers and symptoms over the past few weeks through the holidays and into the new year. The patient spoke about recent weather events and enjoying the snow in the local area. The patient indicated that the B12 shots have been helping.  The patient spoke about the impact of caregiving for her elderly Mother who struggles with Dementia. The OPT therapist utilized Cognitive Behavioral Therapy through cognitive restructuring as well as worked with the patient on coping strategies to assist in management of her mental health symptoms. The patient noted that she is still working to stay active within her health limitations. The patient spoke about looking forward to the Spring and being able to work outside more. The OPT therapist overviewed self checkin and adding positive affirmations. The patient will continue to utilize her existing supports and maintain compliance with her health directives.   Suicidal/Homicidal: Nowithout intent/plan   Therapist Response:  The OPT therapist worked with the patient for the patients session. The patient was engaged in her session and gave feedback in relation to triggers, symptoms, and behavior responses over the past few weeks. The patient overviewed working on her awareness of the impact of stress from caregiving for her elderly Mother who suffers from Dementia and using coping. The OPT therapist worked with the patient utilizing an in session Cognitive Behavioral Therapy exercise. The patient was responsive in the session and acknowledged her work in implementing coping skills/ distractions as well as utilizing positive thinking and staying active within her physical health limits and indicated looking forward to being active over the next few days with involvement with her family during through the  holidays and into the  new year. The patient spoke about her work to remain as active as possible through the Winter but looking forward to the Spring season and getting out of the home more frequently and working outside in the yard. The patient spoke about her B12 shots and has a follow up with her PCP mid February. The OPT therapist will continue treatment work with the patient in her next scheduled session   Plan: Follow-up in 2/3 weeks   Diagnosis:      Axis I:Major depressive disorder, recurrent severe without psychotic features                           Axis II: No diagnosis   I discussed the assessment and treatment plan with the patient. The patient was provided an opportunity to ask questions and all were answered. The patient agreed with the plan and demonstrated an understanding  of the instructions.   The patient was advised to call back or seek an in-person evaluation if the symptoms worsen or if the condition fails to improve as anticipated.   I provided 45 minutes of face-to-face time during this encounter   Lea Primmer, Buzz Cass   09/28/2023

## 2023-10-29 ENCOUNTER — Other Ambulatory Visit: Payer: Self-pay | Admitting: Nurse Practitioner

## 2023-10-29 ENCOUNTER — Other Ambulatory Visit: Payer: Self-pay | Admitting: Adult Health

## 2023-10-31 ENCOUNTER — Encounter: Payer: Self-pay | Admitting: Internal Medicine

## 2023-10-31 ENCOUNTER — Ambulatory Visit: Payer: Medicare PPO | Admitting: Internal Medicine

## 2023-10-31 VITALS — BP 134/76 | HR 67 | Ht 62.0 in | Wt 175.0 lb

## 2023-10-31 DIAGNOSIS — G40109 Localization-related (focal) (partial) symptomatic epilepsy and epileptic syndromes with simple partial seizures, not intractable, without status epilepticus: Secondary | ICD-10-CM

## 2023-10-31 DIAGNOSIS — G43719 Chronic migraine without aura, intractable, without status migrainosus: Secondary | ICD-10-CM

## 2023-10-31 DIAGNOSIS — G919 Hydrocephalus, unspecified: Secondary | ICD-10-CM | POA: Diagnosis not present

## 2023-10-31 DIAGNOSIS — E1142 Type 2 diabetes mellitus with diabetic polyneuropathy: Secondary | ICD-10-CM

## 2023-10-31 DIAGNOSIS — I10 Essential (primary) hypertension: Secondary | ICD-10-CM | POA: Diagnosis not present

## 2023-10-31 DIAGNOSIS — G4730 Sleep apnea, unspecified: Secondary | ICD-10-CM

## 2023-10-31 DIAGNOSIS — E785 Hyperlipidemia, unspecified: Secondary | ICD-10-CM | POA: Diagnosis not present

## 2023-10-31 DIAGNOSIS — E1169 Type 2 diabetes mellitus with other specified complication: Secondary | ICD-10-CM | POA: Diagnosis not present

## 2023-10-31 MED ORDER — AMLODIPINE BESYLATE 5 MG PO TABS
5.0000 mg | ORAL_TABLET | Freq: Every day | ORAL | 3 refills | Status: DC
Start: 2023-10-31 — End: 2024-01-19

## 2023-10-31 MED ORDER — LOSARTAN POTASSIUM 25 MG PO TABS
25.0000 mg | ORAL_TABLET | Freq: Every day | ORAL | 3 refills | Status: DC
Start: 2023-10-31 — End: 2024-01-19

## 2023-10-31 NOTE — Assessment & Plan Note (Signed)
 Her insomnia and nighttime dyspnea could be due to OSA Referred to [facility] neurology for further evaluation

## 2023-10-31 NOTE — Progress Notes (Signed)
 Established Patient Office Visit  Subjective:  Patient ID: Amanda Webster, female    DOB: 1960-11-23  Age: 63 y.o. MRN: [id number]  CC:  Chief Complaint  Patient presents with   Hypertension    Four month follow up    Diabetes    Four month follow up    Sleep Apnea    Follow up     HPI Amanda Webster is a 63 y.o. female with past medical history of HTN, type II DM with HLD, polyarthritis, migraine, seizure disorder, allergic rhinitis and morbid obesity who presents for f/u of her chronic medical conditions.   Type II DM: She is taking Ozempic 1 mg QW currently. Her last HbA1c is 7.0 in 10/24. She has lost about 19 lbs since starting Ozempic 1 mg qw in 09/24.  Her blood glucose has been around 100-130 lately.  She has tried metformin, but had GI discomfort with it as well.  Denies any polyuria or polyphagia currently.  HTN: Her BP is well-controlled.  She takes amlodipine 5 mg QD, losartan 25 mg QD and propranolol 80 mg QAM and 60 mg QPM (mainly for migraine).  She denies any dizziness, chest pain, dyspnea or palpitations currently.  Hydrocephalus: She had revision of VP shunt in 04/24 due to recurrence of hydrocephalus.  She still reports fatigue and weakness.  Her vision has improved now. She had EEG in 01/24, which was unremarkable.  She had NCS/EMG by Dr Terrace Arabia, which showed mild axonal sensorimotor neuropathy.  She is already on gabapentin 600 mg qHS. She has felt progressive leg weakness lately as well, which was also due to recent weight gain and OA of knee.  OSA: She used to use CPAP device for it, but did not tolerate different masks.  She had sleep study done in the past, was ordered by Dr. Gerilyn Pilgrim.  As she was not tolerating CPAP device, she was using oxygen via nasal cannula at nighttime for nocturnal hypoxia, but has stopped using it for a long time now.  She reports dyspnea at nighttime recently.  Denies chest pain or palpitations.  Past Medical History:  Diagnosis Date    Allergy    Anxiety    Arnold-Chiari malformation (HCC)    Arthritis    Borderline diabetic    Chronic headaches    Colon polyps    nonadenomatous   Depression    Diabetes mellitus without complication (HCC)    Diverticulosis    Esophageal stricture    FROZEN LEFT SHOULDER 04/01/2010   Qualifier: Diagnosis of  By: Romeo Apple MD, Duffy Rhody     GERD (gastroesophageal reflux disease)    HTN (hypertension)    Hyperlipidemia    IBS (irritable bowel syndrome)    Seizures (HCC)    pt not sure when her last seizure was- she says she just spaces out- no shaking but she could not tell me last seizure-  on Oxtellar bid for seizures    Sleep apnea    no cpap now- did use 02 with cpap but has not used either in "years"    Past Surgical History:  Procedure Laterality Date   BREAST BIOPSY Left    cahri decompression  8 and 03/1999   CHOLECYSTECTOMY     COLONOSCOPY     invasive cervical traction  02/2009   left breast-lumpectomy     POLYPECTOMY     shunt put in on right side of brain     SHUNT REPLACEMENT  12/10  TOTAL ABDOMINAL HYSTERECTOMY  Age 89 yrs   TAH & BSO   TUBAL LIGATION     UPPER GASTROINTESTINAL ENDOSCOPY      Family History  Problem Relation Age of Onset   Colon polyps Mother    Breast cancer Mother    Lung cancer Mother    Heart disease Father    Stroke Father    Aortic aneurysm Father    Chiari malformation Daughter    Addison's disease Son    Breast cancer Maternal Aunt    Colon cancer Neg Hx    Esophageal cancer Neg Hx    Rectal cancer Neg Hx    Stomach cancer Neg Hx     Social History   Socioeconomic History   Marital status: Married    Spouse name: Not on file   Number of children: 3   Years of education: Not on file   Highest education level: Some college, no degree  Occupational History   Not on file  Tobacco Use   Smoking status: Former    Current packs/day: 0.00    Types: Cigarettes    Quit date: 09/13/1990    Years since quitting: 33.1     Passive exposure: Never   Smokeless tobacco: Never  Vaping Use   Vaping status: Never Used  Substance and Sexual Activity   Alcohol use: No   Drug use: No   Sexual activity: Not Currently    Birth control/protection: Surgical    Comment: hyst  Other Topics Concern   Not on file  Social History Narrative   Disabled from Chiari malformation/seizures.. Drinks 4-5 cups of tea daily. Does not get regular exercise. Married for 40 years.   Social Drivers of Health   Financial Resource Strain: Medium Risk (05/17/2023)   Overall Financial Resource Strain (CARDIA)    Difficulty of Paying Living Expenses: Somewhat hard  Food Insecurity: Food Insecurity Present (05/17/2023)   Hunger Vital Sign    Worried About Running Out of Food in the Last Year: Sometimes true    Ran Out of Food in the Last Year: Sometimes true  Transportation Needs: Unmet Transportation Needs (05/17/2023)   PRAPARE - Administrator, Civil Service (Medical): No    Lack of Transportation (Non-Medical): Yes  Physical Activity: Insufficiently Active (05/17/2023)   Exercise Vital Sign    Days of Exercise per Week: 4 days    Minutes of Exercise per Session: 30 min  Stress: Stress Concern Present (05/17/2023)   [employer] of Occupational Health - Occupational Stress Questionnaire    Feeling of Stress : To some extent  Social Connections: Moderately Integrated (05/17/2023)   Social Connection and Isolation Panel [NHANES]    Frequency of Communication with Friends and Family: Three times a week    Frequency of Social Gatherings with Friends and Family: More than three times a week    Attends Religious Services: 1 to 4 times per year    Active Member of [employer] or Organizations: No    Attends Banker Meetings: Never    Marital Status: Married  Catering manager Violence: Not At Risk (12/09/2022)   Humiliation, Afraid, Rape, and Kick questionnaire    Fear of Current or Ex-Partner: No    Emotionally Abused: No     Physically Abused: No    Sexually Abused: No    Outpatient Medications Prior to Visit  Medication Sig Dispense Refill   ACCU-CHEK GUIDE TEST test strip USE TO TEST BLOOD GLUCOSE THREE TIMES  DAILY 100 strip 3   Accu-Chek Softclix Lancets lancets 1 each by Other route daily. as directed 100 each 3   AIMOVIG 140 MG/ML SOAJ INJECT 140 MG INTO THE SKIN EVERY 30 (THIRTY) DAYS. 3 mL 0   aspirin EC 81 MG tablet Take 1 tablet by mouth daily.     atorvastatin (LIPITOR) 20 MG tablet Take 1 tablet (20 mg total) by mouth daily. 90 tablet 3   Blood Glucose Monitoring Suppl DEVI 1 each by Does not apply route in the morning, at noon, and at bedtime. May substitute to any manufacturer covered by patient's insurance. 1 each 0   cetirizine (ZYRTEC) 10 MG tablet TAKE 1 TABLET EVERY DAY 90 tablet 0   cyanocobalamin (VITAMIN B12) 1000 MCG/ML injection ADMINISTER 1 ML(1000 MCG) UNDER THE SKIN 1 TIME A WEEK 10 mL 0   gabapentin (NEURONTIN) 300 MG capsule Take 600 mg by mouth at bedtime.     Omega-3 Fatty Acids (FISH OIL) 1000 MG CAPS Take 2 capsules by mouth daily. (Patient not taking: Reported on 08/22/2023)     ondansetron (ZOFRAN) 4 MG tablet Take 1 tablet (4 mg total) by mouth every 8 (eight) hours as needed for nausea or vomiting. 20 tablet 0   Oxcarbazepine (TRILEPTAL) 300 MG tablet Take 1 tablet (300 mg total) by mouth 2 (two) times daily. 180 tablet 3   pantoprazole (PROTONIX) 40 MG tablet Take 1 tablet (40 mg total) by mouth daily. 30 tablet 6   polyethylene glycol powder (GLYCOLAX/MIRALAX) 17 GM/SCOOP powder Take 17 g by mouth 2 (two) times daily as needed. 3350 g 1   propranolol (INDERAL) 60 MG tablet TAKE 1 TABLET EVERY MORNING 90 tablet 3   propranolol (INDERAL) 80 MG tablet Take 1 tablet by mouth nightly. To be taken in addition to 60 mg every morning. 90 tablet 3   Semaglutide, 1 MG/DOSE, 4 MG/3ML SOPN Inject 1 mg as directed once a week. 3 mL 3   sennosides-docusate sodium (SENOKOT-S) 8.6-50 MG  tablet Take 1 tablet by mouth daily. 30 tablet 2   UNABLE TO FIND Aleve pain relieving lotion-daily     VITAMIN D PO Take 10,000 Units by mouth daily.     amLODipine (NORVASC) 5 MG tablet Take 1 tablet (5 mg total) by mouth daily. 90 tablet 1   losartan (COZAAR) 25 MG tablet TAKE 1 TABLET(25 MG) BY MOUTH DAILY (Patient taking differently: Take 25 mg by mouth daily.) 45 tablet 0   No facility-administered medications prior to visit.    Allergies  Allergen Reactions   Cymbalta [Duloxetine Hcl] Itching    Nasal itching   Methocarbamol    Propoxyphene N-Acetaminophen Itching   Sulfa Antibiotics     ROS Review of Systems  Constitutional:  Positive for fatigue. Negative for chills and fever.  HENT:  Negative for congestion, sinus pressure and sinus pain.   Eyes:  Negative for pain and discharge.  Respiratory:  Positive for shortness of breath (Intermittent). Negative for cough.   Cardiovascular:  Negative for chest pain and palpitations.  Gastrointestinal:  Positive for constipation. Negative for diarrhea, nausea and vomiting.  Genitourinary:  Negative for frequency and hematuria.  Musculoskeletal:  Positive for arthralgias, back pain and gait problem. Negative for neck pain and neck stiffness.  Skin:  Negative for rash.  Neurological:  Positive for weakness and headaches (Chronic).  Psychiatric/Behavioral:  Negative for agitation and behavioral problems.       Objective:    Physical Exam Vitals reviewed.  Constitutional:      General: She is not in acute distress.    Appearance: She is obese. She is not diaphoretic.  HENT:     Head: Normocephalic and atraumatic.     Nose: Nose normal.     Mouth/Throat:     Mouth: Mucous membranes are moist.  Eyes:     General: No scleral icterus.    Extraocular Movements: Extraocular movements intact.  Cardiovascular:     Rate and Rhythm: Normal rate and regular rhythm.     Heart sounds: Normal heart sounds. No murmur heard. Pulmonary:      Breath sounds: Normal breath sounds. No wheezing or rales.  Musculoskeletal:     Cervical back: Neck supple. No tenderness.     Right lower leg: No edema.     Left lower leg: No edema.  Skin:    General: Skin is warm.     Findings: No rash.  Neurological:     General: No focal deficit present.     Mental Status: She is alert and oriented to person, place, and time.     Sensory: Sensory deficit (b/l LE) present.     Motor: Weakness (RUE - 4/5, b/l LE - 4/5) present.  Psychiatric:        Mood and Affect: Mood normal.        Behavior: Behavior normal.     BP 134/76 (BP Location: Left Arm)   Pulse 67   Ht 5\' 2"  (1.575 m)   Wt 175 lb (79.4 kg)   SpO2 94%   BMI 32.01 kg/m  Wt Readings from Last 3 Encounters:  10/31/23 175 lb (79.4 kg)  08/22/23 176 lb (79.8 kg)  07/15/23 182 lb (82.6 kg)    Lab Results  Component Value Date   TSH 0.634 02/15/2023   Lab Results  Component Value Date   WBC 5.4 07/18/2023   HGB 14.2 07/18/2023   HCT 40.9 07/18/2023   MCV 91 07/18/2023   PLT 208 07/18/2023   Lab Results  Component Value Date   NA 140 07/18/2023   K 3.8 07/18/2023   CO2 27 07/18/2023   GLUCOSE 124 (H) 07/18/2023   BUN 8 07/18/2023   CREATININE 0.79 07/18/2023   BILITOT 0.3 02/15/2023   ALKPHOS 135 (H) 02/15/2023   AST 16 02/15/2023   ALT 16 02/15/2023   PROT 6.8 02/15/2023   ALBUMIN 4.6 02/15/2023   CALCIUM 9.3 07/18/2023   ANIONGAP 8 03/25/2021   EGFR 86 07/18/2023   Lab Results  Component Value Date   CHOL 179 02/15/2023   Lab Results  Component Value Date   HDL 56 02/15/2023   Lab Results  Component Value Date   LDLCALC 97 02/15/2023   Lab Results  Component Value Date   TRIG 147 02/15/2023   Lab Results  Component Value Date   CHOLHDL 3.2 02/15/2023   Lab Results  Component Value Date   HGBA1C 7.0 (H) 06/23/2023      Assessment & Plan:   Problem List Items Addressed This Visit       Cardiovascular and Mediastinum   Essential  hypertension   BP: 134/76   Well-controlled with Amlodipine 5 mg QD,  Losartan 25 mg QD and Propranolol 80 mg + 60 mg QD now Counseled for compliance with the medications Advised DASH diet and moderate exercise/walking, at least 150 mins/week      Relevant Medications   losartan (COZAAR) 25 MG tablet   amLODipine (NORVASC) 5 MG  tablet   Migraine   On Aimovig and propranolol for ppx Followed by neurology - Dr. Terrace Arabia      Relevant Medications   losartan (COZAAR) 25 MG tablet   amLODipine (NORVASC) 5 MG tablet     Respiratory   Sleep apnea   Her insomnia and nighttime dyspnea could be due to OSA Referred to [facility] neurology for further evaluation      Relevant Orders   Ambulatory referral to Neurology     Endocrine   DM type 2 with diabetic peripheral neuropathy [facility]) - Primary   Lab Results  Component Value Date   HGBA1C 7.0 (H) 06/23/2023   Associated with HTN and HLD Uncontrolled, but glycemic profile improving since starting Ozempic in 09/24 - agrees to follow low carb diet and small, frequent meals to avoid nausea; Zofran PRN for nausea Check HbA1c Advised to follow diabetic diet On statin F/u CMP and lipid panel Diabetic eye exam: Advised to follow up with Ophthalmology for diabetic eye exam  On gabapentin 600 mg qHS for neuropathy      Relevant Medications   losartan (COZAAR) 25 MG tablet     Nervous and Auditory   Localization-related epilepsy (HCC)   Takes Trileptal Followed by Neurology      Hydrocephalus [facility])   Has history of Arnold-Chiari malformation VP shunt for hydrocephalus, but had recent increased size of ventricles noted on CT of head - had reprogramming on 10/25/22, and had revision shunt placement in 04/24 as she has recurrent hydrocephalus Followed by neurosurgeon        Meds ordered this encounter  Medications   losartan (COZAAR) 25 MG tablet    Sig: Take 1 tablet (25 mg total) by mouth daily.    Dispense:  90 tablet    Refill:   3   amLODipine (NORVASC) 5 MG tablet    Sig: Take 1 tablet (5 mg total) by mouth daily.    Dispense:  90 tablet    Refill:  3    Follow-up: Return in about 4 months (around 02/28/2024) for Annual physical.    Anabel Halon, MD

## 2023-10-31 NOTE — Telephone Encounter (Signed)
 Last seen on 06/15/23 Follow up scheduled on 12/29/23

## 2023-10-31 NOTE — Assessment & Plan Note (Signed)
On Aimovig and propranolol for ppx Followed by neurology - Dr. Yan 

## 2023-10-31 NOTE — Assessment & Plan Note (Addendum)
 Lab Results  Component Value Date   HGBA1C 7.0 (H) 06/23/2023   Associated with HTN and HLD Uncontrolled, but glycemic profile improving since starting Ozempic in 09/24 - agrees to follow low carb diet and small, frequent meals to avoid nausea; Zofran PRN for nausea Check HbA1c Advised to follow diabetic diet On statin F/u CMP and lipid panel Diabetic eye exam: Advised to follow up with Ophthalmology for diabetic eye exam  On gabapentin 600 mg qHS for neuropathy

## 2023-10-31 NOTE — Assessment & Plan Note (Signed)
Takes Trileptal Followed by Neurology 

## 2023-10-31 NOTE — Assessment & Plan Note (Addendum)
 BP: 134/76   Well-controlled with Amlodipine 5 mg QD,  Losartan 25 mg QD and Propranolol 80 mg + 60 mg QD now Counseled for compliance with the medications Advised DASH diet and moderate exercise/walking, at least 150 mins/week

## 2023-10-31 NOTE — Patient Instructions (Signed)
 Please continue to take medications as prescribed.  Please continue to follow low carb diet and perform moderate exercise/walking at least 150 mins/week.

## 2023-10-31 NOTE — Assessment & Plan Note (Signed)
Has history of Arnold-Chiari malformation VP shunt for hydrocephalus, but had recent increased size of ventricles noted on CT of head - had reprogramming on 10/25/22, and had revision shunt placement in 04/24 as she has recurrent hydrocephalus Followed by neurosurgeon

## 2023-11-01 LAB — LIPID PANEL
Chol/HDL Ratio: 3.1 {ratio} (ref 0.0–4.4)
Cholesterol, Total: 171 mg/dL (ref 100–199)
HDL: 55 mg/dL (ref >39)
LDL Chol Calc (NIH): 99 mg/dL (ref 0–99)
Triglycerides: 93 mg/dL (ref 0–149)
VLDL Cholesterol Cal: 17 mg/dL (ref 5–40)

## 2023-11-01 LAB — CMP14+EGFR
ALT: 24 [IU]/L (ref 0–32)
AST: 21 [IU]/L (ref 0–40)
Albumin: 4.8 g/dL (ref 3.8–4.9)
Alkaline Phosphatase: 151 [IU]/L — ABNORMAL HIGH (ref 44–121)
BUN/Creatinine Ratio: 11 — ABNORMAL LOW (ref 12–28)
BUN: 10 mg/dL (ref 8–27)
Bilirubin Total: 0.4 mg/dL (ref 0.0–1.2)
CO2: 25 mmol/L (ref 20–29)
Calcium: 9.5 mg/dL (ref 8.7–10.3)
Chloride: 91 mmol/L — ABNORMAL LOW (ref 96–106)
Creatinine, Ser: 0.93 mg/dL (ref 0.57–1.00)
Globulin, Total: 2.3 g/dL (ref 1.5–4.5)
Glucose: 96 mg/dL (ref 70–99)
Potassium: 5 mmol/L (ref 3.5–5.2)
Sodium: 132 mmol/L — ABNORMAL LOW (ref 134–144)
Total Protein: 7.1 g/dL (ref 6.0–8.5)
eGFR: 70 mL/min/{1.73_m2} (ref >59)

## 2023-11-01 LAB — HEMOGLOBIN A1C
Est. average glucose Bld gHb Est-mCnc: 123 mg/dL
Hgb A1c MFr Bld: 5.9 % — ABNORMAL HIGH (ref 4.8–5.6)

## 2023-11-03 ENCOUNTER — Ambulatory Visit (HOSPITAL_COMMUNITY): Payer: Medicare PPO | Admitting: Clinical

## 2023-11-03 ENCOUNTER — Telehealth: Payer: Self-pay | Admitting: Internal Medicine

## 2023-11-03 NOTE — Telephone Encounter (Signed)
 Placard  Noted Copied Sleeved (put in provider box)

## 2023-11-08 NOTE — Telephone Encounter (Signed)
 Patient picked up form

## 2023-11-28 ENCOUNTER — Telehealth: Payer: Self-pay

## 2023-11-28 NOTE — Telephone Encounter (Signed)
 Called pt to bring her CPAP machine with power, LVM to give [place] a call back.

## 2023-11-29 ENCOUNTER — Encounter: Payer: Self-pay | Admitting: Neurology

## 2023-11-29 ENCOUNTER — Ambulatory Visit (INDEPENDENT_AMBULATORY_CARE_PROVIDER_SITE_OTHER): Admitting: Neurology

## 2023-11-29 VITALS — BP 110/72 | HR 68 | Ht 62.0 in | Wt 174.4 lb

## 2023-11-29 DIAGNOSIS — E66811 Obesity, class 1: Secondary | ICD-10-CM

## 2023-11-29 DIAGNOSIS — R351 Nocturia: Secondary | ICD-10-CM | POA: Diagnosis not present

## 2023-11-29 DIAGNOSIS — R519 Headache, unspecified: Secondary | ICD-10-CM

## 2023-11-29 DIAGNOSIS — R634 Abnormal weight loss: Secondary | ICD-10-CM

## 2023-11-29 DIAGNOSIS — G4733 Obstructive sleep apnea (adult) (pediatric): Secondary | ICD-10-CM

## 2023-11-29 DIAGNOSIS — G4719 Other hypersomnia: Secondary | ICD-10-CM | POA: Diagnosis not present

## 2023-11-29 NOTE — Progress Notes (Signed)
 Subjective:    Patient ID: Amanda Webster is a 63 y.o. female.  HPI    Huston Foley, MD, PhD [facility] [address], [address] [address] [place], [place] [address]  Dear Dr. Allena Katz,  I saw your patient, Amanda Webster, upon your kind request in my sleep clinic today for initial consultation of her sleep disorder, in particular, evaluation of her prior diagnosis of OSA.  The patient is unaccompanied today.  As you know, [name] is a 63 year old female with an underlying complex medical history of diabetes, neuropathy, hypertension, migraine headaches, epilepsy  (followed by my colleagues, Dr. Terrace Arabia and Ihor Austin, NP), Chiari malformation with status post decompression surgery in 2000, hydrocephalus with status post VP shunt placement in 2000 and recent revision, and obesity, who was previously diagnosed with obstructive sleep apnea and placed on PAP therapy.  Her Epworth sleepiness score is 15 out of 24 severity score is 54 out of 63.  She is not sure if she was diagnosed with mild or moderate or severe sleep apnea at the time.  She has not used her CPAP machine in over 5 years at least and no longer has the machine, in fact.  She had trouble tolerating the mask at the time which was a fullface mask.  She does report having had quite a bit of weight loss since her original diagnosis of sleep apnea, she estimates that she lost at least 20 pounds, if not closer to 30 lb.  Prior sleep study results are not available for my review today.  Prior testing was through her previous neurologist, Dr. Beryle Beams.  I reviewed your office note from 10/31/2023. She reported occasional headaches in the mornings.  She reports nocturia about once or twice per average night.  She is not aware of any family history of sleep apnea.  She received full dentures within the past year, has had her dentures for the past few months.  She goes to bed between 7 and 8 and rise time is between 3  and 5, but she typically is awake in bed till 7 or 8 AM.  She does fall asleep with the TV on but has not on a sleep timer.  She drinks caffeine in the form of soda, 2-3 bottles per day.  She does not drink any alcohol, she quit smoking in the 90s.  Her Past Medical History Is Significant For: Past Medical History:  Diagnosis Date   Allergy    Anxiety    Arnold-Chiari malformation (HCC)    Arthritis    Borderline diabetic    Chronic headaches    Colon polyps    nonadenomatous   Depression    Diabetes mellitus without complication (HCC)    Diverticulosis    Esophageal stricture    FROZEN LEFT SHOULDER 04/01/2010   Qualifier: Diagnosis of  By: Romeo Apple MD, Duffy Rhody     GERD (gastroesophageal reflux disease)    HTN (hypertension)    Hyperlipidemia    IBS (irritable bowel syndrome)    Seizures (HCC)    pt not sure when her last seizure was- she says she just spaces out- no shaking but she could not tell me last seizure-  on [place] bid for seizures    Sleep apnea    no cpap now- did use 02 with cpap but has not used either in "years"    Her Past Surgical History Is Significant For: Past Surgical History:  Procedure Laterality Date   BREAST  BIOPSY Left    cahri decompression  6 and 03/1999   CHOLECYSTECTOMY     COLONOSCOPY     invasive cervical traction  02/2009   left breast-lumpectomy     POLYPECTOMY     shunt put in on right side of brain     SHUNT REPLACEMENT  12/10   TOTAL ABDOMINAL HYSTERECTOMY  Age 61 yrs   TAH & BSO   TUBAL LIGATION     UPPER GASTROINTESTINAL ENDOSCOPY      Her Family History Is Significant For: Family History  Problem Relation Age of Onset   Colon polyps Mother    Breast cancer Mother    Lung cancer Mother    Heart disease Father    Stroke Father    Aortic aneurysm Father    Chiari malformation Daughter    Addison's disease Son    Breast cancer Maternal Aunt    Colon cancer Neg Hx    Esophageal cancer Neg Hx    Rectal cancer Neg Hx     Stomach cancer Neg Hx     Her Social History Is Significant For: Social History   Socioeconomic History   Marital status: Married    Spouse name: Not on file   Number of children: 3   Years of education: Not on file   Highest education level: Some college, no degree  Occupational History   Not on file  Tobacco Use   Smoking status: Former    Current packs/day: 0.00    Types: Cigarettes    Quit date: 09/13/1990    Years since quitting: 33.2    Passive exposure: Never   Smokeless tobacco: Never  Vaping Use   Vaping status: Never Used  Substance and Sexual Activity   Alcohol use: No   Drug use: No   Sexual activity: Not Currently    Birth control/protection: Surgical    Comment: hyst  Other Topics Concern   Not on file  Social History Narrative   Disabled from Chiari malformation/seizures.. Drinks 4-5 cups of pepsi daily. Does not get regular exercise. Married for 40 years.   Lives with one pet, and husband.    Social Drivers of Health   Financial Resource Strain: Medium Risk (05/17/2023)   Overall Financial Resource Strain (CARDIA)    Difficulty of Paying Living Expenses: Somewhat hard  Food Insecurity: Food Insecurity Present (05/17/2023)   Hunger Vital Sign    Worried About Running Out of Food in the Last Year: Sometimes true    Ran Out of Food in the Last Year: Sometimes true  Transportation Needs: Unmet Transportation Needs (05/17/2023)   PRAPARE - Administrator, Civil Service (Medical): No    Lack of Transportation (Non-Medical): Yes  Physical Activity: Insufficiently Active (05/17/2023)   Exercise Vital Sign    Days of Exercise per Week: 4 days    Minutes of Exercise per Session: 30 min  Stress: Stress Concern Present (05/17/2023)   [employer] of Occupational Health - Occupational Stress Questionnaire    Feeling of Stress : To some extent  Social Connections: Moderately Integrated (05/17/2023)   Social Connection and Isolation Panel [NHANES]     Frequency of Communication with Friends and Family: Three times a week    Frequency of Social Gatherings with Friends and Family: More than three times a week    Attends Religious Services: 1 to 4 times per year    Active Member of Clubs or Organizations: No    Attends [employer]  or Organization Meetings: Never    Marital Status: Married    Her Allergies Are:  Allergies  Allergen Reactions   Cymbalta [Duloxetine Hcl] Itching    Nasal itching   Methocarbamol    Propoxyphene N-Acetaminophen Itching   Sulfa Antibiotics   :   Her Current Medications Are:  Outpatient Encounter Medications as of 11/29/2023  Medication Sig   ACCU-CHEK GUIDE TEST test strip USE TO TEST BLOOD GLUCOSE THREE TIMES DAILY   Accu-Chek Softclix Lancets lancets 1 each by Other route daily. as directed   AIMOVIG 140 MG/ML SOAJ INJECT 140 MG INTO THE SKIN EVERY 30 (THIRTY) DAYS.   amLODipine (NORVASC) 5 MG tablet Take 1 tablet (5 mg total) by mouth daily.   aspirin EC 81 MG tablet Take 1 tablet by mouth daily.   atorvastatin (LIPITOR) 20 MG tablet Take 1 tablet (20 mg total) by mouth daily.   Blood Glucose Monitoring Suppl DEVI 1 each by Does not apply route in the morning, at noon, and at bedtime. May substitute to any manufacturer covered by patient's insurance.   cetirizine (ZYRTEC) 10 MG tablet TAKE 1 TABLET EVERY DAY   cyanocobalamin (VITAMIN B12) 1000 MCG/ML injection ADMINISTER 1 ML(1000 MCG) UNDER THE SKIN 1 TIME A WEEK   gabapentin (NEURONTIN) 300 MG capsule Take 600 mg by mouth at bedtime.   losartan (COZAAR) 25 MG tablet Take 1 tablet (25 mg total) by mouth daily.   Oxcarbazepine (TRILEPTAL) 300 MG tablet Take 1 tablet (300 mg total) by mouth 2 (two) times daily.   polyethylene glycol powder (GLYCOLAX/MIRALAX) 17 GM/SCOOP powder Take 17 g by mouth 2 (two) times daily as needed.   propranolol (INDERAL) 60 MG tablet TAKE 1 TABLET EVERY MORNING   propranolol (INDERAL) 80 MG tablet Take 1 tablet by mouth nightly. To  be taken in addition to 60 mg every morning.   Semaglutide, 1 MG/DOSE, 4 MG/3ML SOPN Inject 1 mg as directed once a week.   sennosides-docusate sodium (SENOKOT-S) 8.6-50 MG tablet Take 1 tablet by mouth daily.   UNABLE TO FIND Aleve pain relieving lotion-daily   VITAMIN D PO Take 10,000 Units by mouth daily.   [DISCONTINUED] Omega-3 Fatty Acids (FISH OIL) 1000 MG CAPS Take 2 capsules by mouth daily. (Patient not taking: Reported on 08/22/2023)   [DISCONTINUED] ondansetron (ZOFRAN) 4 MG tablet Take 1 tablet (4 mg total) by mouth every 8 (eight) hours as needed for nausea or vomiting.   [DISCONTINUED] pantoprazole (PROTONIX) 40 MG tablet Take 1 tablet (40 mg total) by mouth daily.   No facility-administered encounter medications on file as of 11/29/2023.  :   Review of Systems:  Out of a complete 14 point review of systems, all are reviewed and negative with the exception of these symptoms as listed below:  Review of Systems  Neurological:        Hx OSA not using cpap with O2 could not find a good mask fit - insomnia and nighttime dyspnea possibly due to untreated OSA / PATEL, RUTWIK K - Last SS more than 5 maybe close to 10 with [place], [place], [place]   ESS 15, FSS 54.     Objective:  Neurological Exam  Physical Exam Physical Examination:   Vitals:   11/29/23 1356  BP: 110/72  Pulse: 68  SpO2: 95%    General Examination: The patient is a very pleasant 63 y.o. female in no acute distress. She appears well-developed and well-nourished and well groomed.   HEENT: Shunt site  unremarkable on palpation right parietal scalp.  Shunt tubing palpable right lateral neck.  Pupils are reactive to light, corrective eyeglasses in place, hearing grossly intact.  Face is symmetric with normal facial animation. Speech is clear with no dysarthria noted. There is no hypophonia. There is no lip, neck/head, jaw or voice tremor. Neck is supple with full range of passive and active motion. There are no  carotid bruits on auscultation. Oropharynx exam reveals: Mild to moderate mouth dryness, full dentures in place, moderate airway crowding, tonsils and tip of uvula not fully visualized, Mallampati class IV.  Neck circumference 15-1/4 inches.  Tongue protrudes centrally.   Chest: Clear to auscultation without wheezing, rhonchi or crackles noted.  Heart: S1+S2+0, regular and normal without murmurs, rubs or gallops noted.   Abdomen: Soft, non-tender and non-distended.  Extremities: There is no pitting edema in the distal lower extremities bilaterally.   Skin: Warm and dry without trophic changes noted.   Musculoskeletal: exam reveals no obvious joint deformities.   Neurologically:  Mental status: The patient is awake, alert and oriented in all 4 spheres. Her immediate and remote memory, attention, language skills and fund of knowledge are appropriate. There is no evidence of aphasia, agnosia, apraxia or anomia. Speech is clear with normal prosody and enunciation. Thought process is linear. Mood is normal and affect is normal.  Cranial nerves II - XII are as described above under HEENT exam.  Motor exam: Normal bulk, strength and tone is noted. There is no obvious action or resting tremor.  Fine motor skills and coordination: grossly intact.  Cerebellar testing: No dysmetria or intention tremor. There is no truncal or gait ataxia.  Sensory exam: intact to light touch in the upper and lower extremities.  Gait, station and balance: She stands easily. No veering to one side is noted. No leaning to one side is noted. Posture is age-appropriate and stance is narrow based. Gait shows normal stride length and normal pace. No problems turning are noted.   Assessment and Plan:  In summary, MCKENNA GAMM is a very pleasant 63 y.o.-year old female with an underlying complex medical history of diabetes, neuropathy, hypertension, migraine headaches, epilepsy  (followed by my colleagues, Dr. Terrace Arabia and  Ihor Austin, NP), Chiari malformation with status post decompression surgery in 2000, hydrocephalus with status post VP shunt placement in 2000 and recent revision, and obesity, who presents for evaluation of her obstructive sleep apnea.  She was diagnosed years ago and could not tolerate CPAP therapy at the time.  She would be willing to get reevaluated and consider treatment again.  Of note, she does indicate significant daytime somnolence.  She does not drive.   While a laboratory attended sleep study is typically considered "gold standard" for evaluation of sleep disordered breathing, we mutually agreed to proceed with a home sleep test at this time for reevaluation purposes.   I had a long chat with the patient about my findings and the diagnosis of sleep apnea, particularly OSA, its prognosis and treatment options. We talked about medical/conservative treatments, surgical interventions and non-pharmacological approaches for symptom control. I explained, in particular, the risks and ramifications of untreated moderate to severe OSA, especially with respect to developing cardiovascular disease down the road, including congestive heart failure (CHF), difficult to treat hypertension, cardiac arrhythmias (particularly A-fib), neurovascular complications including TIA, stroke and dementia. Even type 2 diabetes has, in part, been linked to untreated OSA. Symptoms of untreated OSA may include (but may not be limited to)  daytime sleepiness, nocturia (i.e. frequent nighttime urination), memory problems, mood irritability and suboptimally controlled or worsening mood disorder such as depression and/or anxiety, lack of energy, lack of motivation, physical discomfort, as well as recurrent headaches, especially morning or nocturnal headaches. We talked about the importance of maintaining a healthy lifestyle and striving for healthy weight. In addition, we talked about the importance of striving for and maintaining good  sleep hygiene. I recommended a sleep study at this time. I outlined the differences between a laboratory attended sleep study which is considered more comprehensive and accurate over the option of a home sleep test (HST); the latter may lead to underestimation of sleep disordered breathing in some instances and does not help with diagnosing upper airway resistance syndrome and is not accurate enough to diagnose primary central sleep apnea typically. I outlined possible surgical and non-surgical treatment options of OSA, including the use of a positive airway pressure (PAP) device (i.e. CPAP, AutoPAP/APAP or BiPAP in certain circumstances), a custom-made dental device (aka oral appliance, which would require a referral to a specialist dentist or orthodontist typically, and is generally speaking not considered for patients with full dentures or edentulous state), upper airway surgical options, such as traditional UPPP (which is not considered a first-line treatment) or the Inspire device (hypoglossal nerve stimulator, which would involve a referral for consultation with an ENT surgeon, after careful selection, following inclusion criteria - also not first-line treatment). I explained the PAP treatment option to the patient in detail, as this is generally considered first-line treatment.  The patient indicated that she would be willing to try PAP therapy, if the need arises. I explained the importance of being compliant with PAP treatment, not only for insurance purposes but primarily to improve patient's symptoms symptoms, and for the patient's long term health benefit, including to reduce Her cardiovascular risks longer-term.    We will pick up our discussion about the next steps and treatment options after testing.  We will keep her posted as to the test results by phone call and/or MyChart messaging where possible.  We will plan to follow-up in sleep clinic accordingly as well.  I answered all her questions  today and the patient was in agreement.   I encouraged her to call with any interim questions, concerns, problems or updates or email [place] through MyChart.  Generally speaking, sleep test authorizations may take up to 2 weeks, sometimes less, sometimes longer, the patient is encouraged to get in touch with [place] if they do not hear back from the sleep lab staff directly within the next 2 weeks.  Thank you very much for allowing me to participate in the care of this nice patient. If I can be of any further assistance to you please do not hesitate to call me at [phone].  Sincerely,   Huston Foley, MD, PhD

## 2023-11-29 NOTE — Patient Instructions (Signed)

## 2023-12-02 ENCOUNTER — Ambulatory Visit (INDEPENDENT_AMBULATORY_CARE_PROVIDER_SITE_OTHER): Admitting: Neurology

## 2023-12-02 DIAGNOSIS — R351 Nocturia: Secondary | ICD-10-CM

## 2023-12-02 DIAGNOSIS — G4734 Idiopathic sleep related nonobstructive alveolar hypoventilation: Secondary | ICD-10-CM

## 2023-12-02 DIAGNOSIS — E66811 Obesity, class 1: Secondary | ICD-10-CM

## 2023-12-02 DIAGNOSIS — R634 Abnormal weight loss: Secondary | ICD-10-CM

## 2023-12-02 DIAGNOSIS — G4733 Obstructive sleep apnea (adult) (pediatric): Secondary | ICD-10-CM | POA: Diagnosis not present

## 2023-12-02 DIAGNOSIS — G4719 Other hypersomnia: Secondary | ICD-10-CM

## 2023-12-02 DIAGNOSIS — R519 Headache, unspecified: Secondary | ICD-10-CM

## 2023-12-11 ENCOUNTER — Other Ambulatory Visit: Payer: Self-pay | Admitting: Internal Medicine

## 2023-12-15 DIAGNOSIS — G918 Other hydrocephalus: Secondary | ICD-10-CM | POA: Diagnosis not present

## 2023-12-15 DIAGNOSIS — Z982 Presence of cerebrospinal fluid drainage device: Secondary | ICD-10-CM | POA: Diagnosis not present

## 2023-12-15 DIAGNOSIS — Z981 Arthrodesis status: Secondary | ICD-10-CM | POA: Diagnosis not present

## 2023-12-18 ENCOUNTER — Other Ambulatory Visit: Payer: Self-pay | Admitting: Internal Medicine

## 2023-12-18 DIAGNOSIS — E1142 Type 2 diabetes mellitus with diabetic polyneuropathy: Secondary | ICD-10-CM

## 2023-12-20 NOTE — Progress Notes (Unsigned)
 Marland Kitchen

## 2023-12-21 NOTE — Procedures (Signed)
 [facility]  HOME SLEEP TEST (SANSA) REPORT (Mail-Out Device):   STUDY DATE: 12/12/2023  DOB: 1961-07-17  MRN: [id number]  ORDERING CLINICIAN: Huston Foley, MD, PhD   REFERRING CLINICIAN: Anabel Halon, MD   CLINICAL INFORMATION/HISTORY: 64 year old female with an underlying complex medical history of diabetes, neuropathy, hypertension, migraine headaches, epilepsy  (followed by my colleagues, Dr. Terrace Arabia and Ihor Austin, NP), Chiari malformation with status post decompression surgery in 2000, hydrocephalus with status post VP shunt placement in 2000 and recent revision, and obesity, who was previously diagnosed with obstructive sleep apnea and placed on PAP therapy.  She is no longer on PAP therapy and presents for reevaluation.  PATIENT'S LAST REPORTED EPWORTH SLEEPINESS SCORE (ESS): 15/24.  BMI (at the time of sleep clinic visit and/or test date): 31.9 kg/m  FINDINGS:   Study Protocol:    The SANSA single-point-of-skin-contact chest-worn sensor - an FDA cleared and DOT approved type 4 home sleep test device - measures eight physiological channels,  including blood oxygen saturation (measured via PPG [photoplethysmography]), EKG-derived heart rate, respiratory effort, chest movement (measured via accelerometer), snoring, body position, and actigraphy. The device is designed to be worn for up to 10 hours per study.   Sleep Summary:   Total Recording Time (hours, min): 9 hours, 57 min  Total Effective Sleep Time (hours, min):  8 hours, 48 min  Sleep Efficiency (%):    88%   Respiratory Indices:   Calculated sAHI (per hour):  8.2/hour (utilizing the 4% desaturation criteria for obstructive hypopneas per Medicare guidelines)         Oxygen Saturation Statistics:    Oxygen Saturation (%) Mean: 90.1%   Minimum oxygen saturation (%):                 76.8%   O2 Saturation Range (%): 76.8-96.7%   Time below or at 88% saturation: 57 min   Pulse Rate  Statistics:   Pulse Mean (bpm):    58/min    Pulse Range (51-105/min)   Snoring: Mild to moderate  IMPRESSION/DIAGNOSES:   OSA (obstructive sleep apnea)  Nocturnal Hypoxemia  RECOMMENDATIONS:   This home sleep test demonstrates overall mild obstructive sleep apnea by AHI criteria but significant oxygen desaturations during sleep with time below or at 88% saturation of nearly 1 hour, indicating nocturnal hypoxemia. Given the patient's medical history and sleep related complaints, therapy with a positive airway pressure device is recommended. Treatment can be achieved in the form of autoPAP trial/titration at home for now. A full night, in-lab PAP titration study may aid in improving proper treatment settings and with mask fit, if needed, down the road. Alternative treatments may include weight loss (where appropriate) along with avoidance of the supine sleep position (if possible), or an oral appliance in appropriate candidates.   Please note that untreated obstructive sleep apnea may carry additional perioperative morbidity. Patients with significant obstructive sleep apnea should receive perioperative PAP therapy and the surgeons and particularly the anesthesiologist should be informed of the diagnosis and the severity of the sleep disordered breathing. The patient should be cautioned not to drive, work at heights, or operate dangerous or heavy equipment when tired or sleepy. Review and reiteration of good sleep hygiene measures should be pursued with any patient. Other causes of the patient's symptoms, including circadian rhythm disturbances, an underlying mood disorder, medication effect and/or an underlying medical problem cannot be ruled out based on this test. Clinical correlation is recommended.  The patient and her  referring provider will be notified of the test results. The patient will be seen in follow up in sleep clinic at [facility], as necessary.  I certify that I have reviewed the raw  data recording prior to the issuance of this report in accordance with the standards of the American Academy of Sleep Medicine (AASM).  INTERPRETING PHYSICIAN:   Huston Foley, MD, PhD Medical Director, [facility]) Diplomat, ABPN (Neurology and Sleep)   [facility] [address], [address] [place], [place] [address] [phone]

## 2023-12-21 NOTE — Addendum Note (Signed)
 Addended by: Huston Foley on: 12/21/2023 05:49 PM   Modules accepted: Orders

## 2023-12-25 ENCOUNTER — Other Ambulatory Visit: Payer: Self-pay | Admitting: Nurse Practitioner

## 2023-12-27 ENCOUNTER — Telehealth: Payer: Self-pay

## 2023-12-27 NOTE — Telephone Encounter (Signed)
 Called pt and got her scheduled for her 3 month Initial CPAP Appointment for 02/26/24 @ 10:45am with Dr. Omar Bibber.

## 2023-12-27 NOTE — Progress Notes (Signed)
 Sent Community message to [employer]

## 2023-12-27 NOTE — Telephone Encounter (Addendum)
 Called pt and relayed the below Results per Dr. Omar Bibber.   ----- Message from Debbra Fairy sent at 12/21/2023  5:49 PM EDT ----- Patient referred by PCP, seen by me on 11/29/2023, patient had HST on 12/12/2023.    Please call and notify the patient that the recent home sleep test showed overall mild obstructive sleep apnea but significant oxygen desaturation while asleep and I would recommend that we trial her on home AutoPap therapy.  We don't have to bring her in for a sleep study with CPAP, but will let her try an autoPAP machine at home, through a DME company (of her choice, or as per insurance requirement). The DME representative will educate her on how to use the machine, how to put the mask on, etc. I have placed an order in the chart. Please send referral, talk to patient, send report to referring MD. We will need a FU in sleep clinic in about 2.-3 months post-PAP set up (which is usually an insurance-mandated appointment to monitor compliance), please arrange that with me or one of our NPs. Please also go over the need for compliance with treatment (including the insurance-imposed minimum compliance percentage). Thanks,   Debbra Fairy, MD, PhD [facility])

## 2023-12-29 ENCOUNTER — Ambulatory Visit: Payer: Medicare PPO | Admitting: Adult Health

## 2023-12-29 ENCOUNTER — Encounter: Payer: Self-pay | Admitting: Adult Health

## 2023-12-29 VITALS — BP 132/80 | HR 67 | Ht 62.0 in | Wt 169.0 lb

## 2023-12-29 DIAGNOSIS — G43709 Chronic migraine without aura, not intractable, without status migrainosus: Secondary | ICD-10-CM

## 2023-12-29 DIAGNOSIS — Z7985 Long-term (current) use of injectable non-insulin antidiabetic drugs: Secondary | ICD-10-CM

## 2023-12-29 DIAGNOSIS — Z982 Presence of cerebrospinal fluid drainage device: Secondary | ICD-10-CM | POA: Diagnosis not present

## 2023-12-29 DIAGNOSIS — E1142 Type 2 diabetes mellitus with diabetic polyneuropathy: Secondary | ICD-10-CM

## 2023-12-29 DIAGNOSIS — G4733 Obstructive sleep apnea (adult) (pediatric): Secondary | ICD-10-CM | POA: Diagnosis not present

## 2023-12-29 DIAGNOSIS — G40909 Epilepsy, unspecified, not intractable, without status epilepticus: Secondary | ICD-10-CM

## 2023-12-29 MED ORDER — GABAPENTIN 300 MG PO CAPS: 600.0000 mg | ORAL_CAPSULE | Freq: Every day | ORAL | 11 refills | Status: DC

## 2023-12-29 MED ORDER — AIMOVIG 140 MG/ML ~~LOC~~ SOAJ
140.0000 mg | SUBCUTANEOUS | 3 refills | Status: DC
  Filled 2024-02-23 – 2024-07-01 (×3): qty 3, 90d supply, fill #0
  Filled 2024-07-25: qty 3, 90d supply, fill #1

## 2023-12-29 MED ORDER — OXCARBAZEPINE 300 MG PO TABS
300.0000 mg | ORAL_TABLET | Freq: Two times a day (BID) | ORAL | 3 refills | Status: DC
  Filled 2024-02-23 – 2024-06-04 (×5): qty 180, 90d supply, fill #0
  Filled 2024-07-25: qty 180, 90d supply, fill #1

## 2023-12-29 NOTE — Patient Instructions (Addendum)
 Your Plan:  Continue oxcarbazepine - would recommend taking 300mg  in the morning and at night  Please call if episodes persist   Restart gabapentin 600mg  nightly for headaches and neuropathy   Continue Aimovig monthly for migraine prevention   Start autoPAP for treatment of sleep apnea and follow up with Dr. Omar Bibber in July as scheduled       Follow up in 6 months or call earlier if needed      Thank you for coming to see us  at [facility]. I hope we have been able to provide you high quality care today.  You may receive a patient satisfaction survey over the next few weeks. We would appreciate your feedback and comments so that we may continue to improve ourselves and the health of our patients.

## 2023-12-29 NOTE — Progress Notes (Signed)
 Chief Complaint  Patient presents with   Headache    Rm 3 alone Pt is well, reports she has been off of gabapentin and headaches have increase. Has about 5-7 headaches a week.   She also reports she has been spacing out more recently. Questions if she is having more seizures.      ASSESSMENT AND PLAN  Amanda Webster is a 63 y.o. female   History of Chiari decompression, placement of VP shunt  Followed by Dr. Angelyn Punt neurosurgery  [facility] 12/2023 adequate working shunt             Shunt failure requiring revision on 12/30/2022 with replacement of programmable shunt Revision by [facility] Dr. Angelyn Punt on August 20, 2022, due to reported slow worsening headache, unsteady gait, presurgical repeat MRI showed worsening ventricular size     Diabetic neuropathy             EMG nerve conduction study confirmed mild axonal sensorimotor neuropathy, consistent with her diabetic peripheral neuropathy  Restart gabapentin 600 mg nightly  Recent A1c 5.9, down from 7.0    Reported history of seizure,             Spacing spells, worsening over the past several weeks (seems to correlate since running out of gabapentin)  Recommend continuation of Trileptal but to take 300 mg twice daily (currently taking 600 mg nightly)  Lab work 10/2023 Na 132, down from 140 although no adjustments made to Trileptal dosage, has f/u visit with PCP in June for repeat labs, advised to continue to monitor sodium level  Restart gabapentin 600 mg nightly  Advised to call if symptoms persist, may need to consider ambulatory EEG to further characterize episodes             EEG was normal in January 2024,    Occasionally headache with migraine features             Reports worsening after discontinuing gabapentin - about 5-7 per week  Restart gabapentin 600 mg daily  On Aimovig monthly injection - refill provided  No benefit with botox previously   OSA  Recent eval by Dr. Frances Furbish 11/29/23  Prior dx of apnea on CPAP  therapy, prior use over 5 years ago  Sleep study 12/2023 mild apnea but significant oxygen desaturation  Currently waiting to be set up with CPAP, initial CPAP compliance visit in July with Dr. Frances Furbish     Follow-up in 6 months or call earlier if needed      DIAGNOSTIC DATA (LABS, IMAGING, TESTING) - I reviewed patient records, labs, notes, testing and imaging myself where available.   MEDICAL HISTORY:   Update 12/29/2023 JM:   Returns today for follow-up visit.  Reports she ran out of gabapentin several weeks ago due to issues obtaining a refill (previously prescribed by Dr. Gerilyn Pilgrim) and has been experiencing increase headache frequency.  She does remain on Aimovig monthly injections.  She has also been experiencing episodes in the morning shortly after awakening where she feels "spaced out" and then has extreme fatigue after.  This has been happening more frequently over the past several weeks.  Does check her glucose levels at this time which have been normal.  Reports compliance on oxcarbazepine but recently realized she has been taking 600 mg nightly instead of 300 mg twice daily.  She feels overall her neuropathy has been stable without progression.  She continues to have intermittent blurry vision and feels like she is "  drunk walking".  Was seen by neurosurgery and was advised that symptoms are likely due to her shunt, reports PCP concerning possibly cardiac related, has follow-up with Dr. Wyline Mood in May.  She continues to experience excessive daytime fatigue, has not improved since running out of gabapentin, she was diagnosed with mild sleep apnea but significant oxygen desaturation, awaiting to be set up with CPAP.    Update 06/15/2023 JM:  Reports having a couple seizures since prior visit, typical episode with starring, usually occurs with increased stress which she has been experiencing more of caring for her mother with Alzheimer's dementia.  Reports compliance on Trileptal 300 mg  twice daily, denies any specific side effects.  Denies any recent migraine headaches, continues on Aimovig monthly injection.  Continues to be followed by [facility] neurosurgery Dr. Angelyn Punt who noted shunt failure requiring revision 4/18 with replacement to [employer] programmable shunt set to 4. Settings changed to 2 in May d/t c/o dizziness and headaches.  Recent f/u with neurosurgery 7/11 noted improvement of symptoms after setting change.  Called neurosurgery on 9/16 reporting different type of chronic dizziness, blurred vision, posterior headache, B/B incontinence and recent fall. Per telephone note, NP recommended patient proceed to ED for urgent scan and to check for shunt failure but patient reports never being called back with recommendations. Reports symptoms still persistent, present over the past month. Reports B/B incontinence, blurry vision and posterior headaches intermittent. Feels like she walks like she is drunk. She plans on calling neurosurgery again to follow up.   UPDATE December 15 2022 Dr. Terrace Arabia: She complains of frequent dizziness, especially when standing up, risk for fall, limited water intake, orthostatic blood pressure was positive today,   Also complains of intermittent headache, nauseous, lightheaded, improved by sleep, related to weather change, have some migraine features, never tried triptans in the past,   We personally reviewed MRI of cervical spine from February 2024, ACDF C5-6, C6-7, no residual cord or foraminal stenosis, mild degenerative changes at C3-4, evidence of suboccipital craniectomy   Follow-up with neurosurgeon Dr. Angelyn Punt in May 2024   Consult visit 09/21/2022 Dr. Terrace Arabia: DHANVI BOESEN is a 63 year old female, seen in request by her primary care doctor Anabel Halon, for evaluation of gait abnormality, history of VP shunt, need to establish neurological care, was previously seen by Dr. Gerilyn Pilgrim   I reviewed and summarized the referring note.  PMHX HTN HLD DM for more than 10 years. Anxiety Seizure, space out, last one was in Dec 2023, brought on by stress Obstructive sleep apnea, could not tolerate CPAP. VP shunt  Patient has a history of posterior decompression surgery for Arnold-Chiari formation in 2000, reported prior to the surgery, she has frequent headaches, weakness, also bilateral hand muscle weakness, reported surgery was helpful, but she has developed complications," spinal fluid leak?',  Eventually had VP shunt placement,  She was able to go back to to work, then had increased difficulties, constellation of complaints, generalized weakness, unsteady gait, around 2010, went on disability  She also had a history of seizure disorder started shortly after her decompression surgery, initially frequent ajovy triggered by stress, now only has small spells intermittently, spacing out, lasting for few minutes, felt fatigued afterwards, had to go to sleep, is taking Trileptal 300 twice a day  She was seen by [facility] neurosurgeon Dr. Since October 2023 for worsening mental postop, increased headache, also mild unsteady gait, leading to revision with Dr. Regino Bellow August 20, 2022, reported some improvement, walk  better,  Personally reviewed MRI of the brain September 2023, prior to most revision, chronic CSF shunt with increased lateral and third ventricular/complex abnormality in 2016, no definite transependymal edema, increased nonspecific signal changes in the left frontal pole,    PHYSICAL EXAM:   Vitals:   12/29/23 1510  BP: 132/80  Pulse: 67  Weight: 169 lb (76.7 kg)  Height: 5\' 2"  (1.575 m)   Body mass index is 30.91 kg/m.  PHYSICAL EXAMNIATION:  Gen: NAD, very pleasant middle-age Caucasian female, conversant, well nourised, well groomed                     Cardiovascular: Regular rate rhythm, no peripheral edema, warm, nontender. Eyes: Conjunctivae clear without exudates or hemorrhage Neck: Supple, no carotid  bruits. Pulmonary: Clear to auscultation bilaterally   NEUROLOGICAL EXAM:  MENTAL STATUS: Speech/cognition: Awake, alert, oriented to history taking and casual conversation CRANIAL NERVES: CN II: Visual fields are full to confrontation. Pupils are round equal and briskly reactive to light. CN III, IV, VI: extraocular movement are normal. No ptosis. CN V: Facial sensation is intact to light touch CN VII: Face is symmetric with normal eye closure  CN VIII: Hearing is normal to causal conversation. CN IX, X: Phonation is normal. CN XI: Head turning and shoulder shrug are intact  MOTOR: There is no pronator drift of out-stretched arms. Muscle bulk and tone are normal. Muscle strength is normal.  REFLEXES: Reflexes are 2+ and symmetric at the biceps, triceps, knees, and trace ankles. Plantar responses are extensor bilaterally  SENSORY: Length-dependent decreased light touch, pinprick to mid shin level  COORDINATION: There is no trunk or limb dysmetria noted.  GAIT/STANCE: Needs push-up to get up from seated position, unsteady gait greater returns, no use of AD, tandem walk and heel toe not attempted  REVIEW OF SYSTEMS:  Full 14 system review of systems performed and notable only for as above All other review of systems were negative.   ALLERGIES: Allergies  Allergen Reactions   Cymbalta [Duloxetine Hcl] Itching    Nasal itching   Methocarbamol    Propoxyphene N-Acetaminophen Itching   Sulfa Antibiotics     HOME MEDICATIONS: Current Outpatient Medications  Medication Sig Dispense Refill   ACCU-CHEK GUIDE TEST test strip USE TO TEST BLOOD GLUCOSE THREE TIMES DAILY 100 strip 3   Accu-Chek Softclix Lancets lancets 1 each by Other route daily. as directed 100 each 3   amLODipine (NORVASC) 5 MG tablet Take 1 tablet (5 mg total) by mouth daily. 90 tablet 3   aspirin EC 81 MG tablet Take 2 tablets by mouth daily.     atorvastatin (LIPITOR) 20 MG tablet Take 1 tablet (20 mg  total) by mouth daily. 90 tablet 3   Blood Glucose Monitoring Suppl DEVI 1 each by Does not apply route in the morning, at noon, and at bedtime. May substitute to any manufacturer covered by patient's insurance. 1 each 0   cetirizine (ZYRTEC) 10 MG tablet TAKE 1 TABLET EVERY DAY 90 tablet 0   cyanocobalamin (VITAMIN B12) 1000 MCG/ML injection ADMINISTER 1 ML(1000 MCG) UNDER THE SKIN 1 TIME A WEEK 10 mL 0   losartan (COZAAR) 25 MG tablet Take 1 tablet (25 mg total) by mouth daily. 90 tablet 3   polyethylene glycol powder (GLYCOLAX/MIRALAX) 17 GM/SCOOP powder Take 17 g by mouth 2 (two) times daily as needed. 3350 g 1   propranolol (INDERAL) 60 MG tablet TAKE 1 TABLET EVERY MORNING 90 tablet  3   propranolol (INDERAL) 80 MG tablet TAKE 1 TABLET NIGHTLY TO BE TAKEN IN ADDITION TO 60 MG EVERY MORNING. 90 tablet 0   Semaglutide, 1 MG/DOSE, (OZEMPIC, 1 MG/DOSE,) 4 MG/3ML SOPN INJECT 1 MG AS DIRECTED ONCE A WEEK. 3 mL 3   sennosides-docusate sodium (SENOKOT-S) 8.6-50 MG tablet Take 1 tablet by mouth daily. 30 tablet 2   UNABLE TO FIND Aleve pain relieving lotion-daily     VITAMIN D PO Take 10,000 Units by mouth daily.     AIMOVIG 140 MG/ML SOAJ Inject 140 mg into the skin every 30 (thirty) days. 3 mL 3   gabapentin (NEURONTIN) 300 MG capsule Take 2 capsules (600 mg total) by mouth at bedtime. 60 capsule 11   Oxcarbazepine (TRILEPTAL) 300 MG tablet Take 1 tablet (300 mg total) by mouth 2 (two) times daily. 180 tablet 3   No current facility-administered medications for this visit.    PAST MEDICAL HISTORY: Past Medical History:  Diagnosis Date   Allergy    Anxiety    Arnold-Chiari malformation (HCC)    Arthritis    Borderline diabetic    Chronic headaches    Colon polyps    nonadenomatous   Depression    Diabetes mellitus without complication (HCC)    Diverticulosis    Esophageal stricture    FROZEN LEFT SHOULDER 04/01/2010   Qualifier: Diagnosis of  By: Romeo Apple MD, Duffy Rhody     GERD  (gastroesophageal reflux disease)    HTN (hypertension)    Hyperlipidemia    IBS (irritable bowel syndrome)    Seizures (HCC)    pt not sure when her last seizure was- she says she just spaces out- no shaking but she could not tell me last seizure-  on Oxtellar bid for seizures    Sleep apnea    no cpap now- did use 02 with cpap but has not used either in "years"    PAST SURGICAL HISTORY: Past Surgical History:  Procedure Laterality Date   BREAST BIOPSY Left    cahri decompression  8 and 03/1999   CHOLECYSTECTOMY     COLONOSCOPY     invasive cervical traction  02/2009   left breast-lumpectomy     POLYPECTOMY     shunt put in on right side of brain     SHUNT REPLACEMENT  12/10   TOTAL ABDOMINAL HYSTERECTOMY  Age 75 yrs   TAH & BSO   TUBAL LIGATION     UPPER GASTROINTESTINAL ENDOSCOPY      FAMILY HISTORY: Family History  Problem Relation Age of Onset   Colon polyps Mother    Breast cancer Mother    Lung cancer Mother    Heart disease Father    Stroke Father    Aortic aneurysm Father    Chiari malformation Daughter    Addison's disease Son    Breast cancer Maternal Aunt    Colon cancer Neg Hx    Esophageal cancer Neg Hx    Rectal cancer Neg Hx    Stomach cancer Neg Hx     SOCIAL HISTORY: Social History   Socioeconomic History   Marital status: Married    Spouse name: Not on file   Number of children: 3   Years of education: Not on file   Highest education level: Some college, no degree  Occupational History   Not on file  Tobacco Use   Smoking status: Former    Current packs/day: 0.00    Types: Cigarettes  Quit date: 09/13/1990    Years since quitting: 33.3    Passive exposure: Never   Smokeless tobacco: Never  Vaping Use   Vaping status: Never Used  Substance and Sexual Activity   Alcohol use: No   Drug use: No   Sexual activity: Not Currently    Birth control/protection: Surgical    Comment: hyst  Other Topics Concern   Not on file  Social  History Narrative   Disabled from Chiari malformation/seizures.. Drinks 4-5 cups of pepsi daily. Does not get regular exercise. Married for 40 years.   Lives with one pet, and husband.    Social Drivers of Health   Financial Resource Strain: Medium Risk (05/17/2023)   Overall Financial Resource Strain (CARDIA)    Difficulty of Paying Living Expenses: Somewhat hard  Food Insecurity: Food Insecurity Present (05/17/2023)   Hunger Vital Sign    Worried About Running Out of Food in the Last Year: Sometimes true    Ran Out of Food in the Last Year: Sometimes true  Transportation Needs: Unmet Transportation Needs (05/17/2023)   PRAPARE - Administrator, Civil Service (Medical): No    Lack of Transportation (Non-Medical): Yes  Physical Activity: Insufficiently Active (05/17/2023)   Exercise Vital Sign    Days of Exercise per Week: 4 days    Minutes of Exercise per Session: 30 min  Stress: Stress Concern Present (05/17/2023)   [employer] of Occupational Health - Occupational Stress Questionnaire    Feeling of Stress : To some extent  Social Connections: Moderately Integrated (05/17/2023)   Social Connection and Isolation Panel [NHANES]    Frequency of Communication with Friends and Family: Three times a week    Frequency of Social Gatherings with Friends and Family: More than three times a week    Attends Religious Services: 1 to 4 times per year    Active Member of [employer] or Organizations: No    Attends Banker Meetings: Not on file    Marital Status: Married  Catering manager Violence: Not At Risk (12/09/2022)   Humiliation, Afraid, Rape, and Kick questionnaire    Fear of Current or Ex-Partner: No    Emotionally Abused: No    Physically Abused: No    Sexually Abused: No      I spent 45 minutes of face-to-face and non-face-to-face time with patient.  This included previsit chart review, lab review, study review, order entry, electronic health record documentation,  patient education and discussion regarding above diagnoses and treatment plan and answered all other questions to patient's satisfaction  Johny Nap, [facility]  [facility] [address] [place], [place] [address]  Phone [phone] Fax [phone] Note: This document was prepared with digital dictation and possible smart phrase technology. Any transcriptional errors that result from this process are unintentional.

## 2024-01-09 DIAGNOSIS — H4710 Unspecified papilledema: Secondary | ICD-10-CM | POA: Diagnosis not present

## 2024-01-09 DIAGNOSIS — Z982 Presence of cerebrospinal fluid drainage device: Secondary | ICD-10-CM | POA: Diagnosis not present

## 2024-01-09 DIAGNOSIS — G918 Other hydrocephalus: Secondary | ICD-10-CM | POA: Diagnosis not present

## 2024-01-10 ENCOUNTER — Other Ambulatory Visit (HOSPITAL_COMMUNITY): Payer: Self-pay

## 2024-01-10 ENCOUNTER — Telehealth: Payer: Self-pay

## 2024-01-10 NOTE — Telephone Encounter (Signed)
 Received a Fax requesting add info-faxed form and clinicals to [facility] at [phone]

## 2024-01-10 NOTE — Telephone Encounter (Signed)
 Pharmacy Patient Advocate Encounter   Received notification from CoverMyMeds that prior authorization for Aimovig  140MG /ML auto-injectors is required/requested.   Insurance verification completed.   The patient is insured through [place] .   Per test claim: PA required; PA submitted to above mentioned insurance via CoverMyMeds Key/confirmation #/EOC [id number] Status is pending

## 2024-01-13 NOTE — Telephone Encounter (Signed)
 Pharmacy Patient Advocate Encounter  Received notification from [employer] that Prior Authorization for Aimovig  140MG /ML auto-injectors  has been DENIED.  Full denial letter will be uploaded to the media tab. See denial reason below.   PA #/Case ID/Reference #: PA Case ID #: [id number]

## 2024-01-16 NOTE — Telephone Encounter (Signed)
 Please clarify with patient if she has ever tried [employer] before as she was previously treated by Dr. Joleen Navy.  If she has never tried, can switch to [employer].  If she has tried before, we will need to try [place] per insurance requirements.

## 2024-01-16 NOTE — Telephone Encounter (Signed)
 Spoke w/Pt regarding medication. Pt states to her recollection he has never taken [place] or [place]. Pt is asking if the denial can please be appealed. She has been on Aimovig  for a while and it is working and she is not understanding why they are denying now as she has had [employer] in the past. Informed Pt we will reach out to the PA team and ask for an appeal to be sent. Pt stated understanding and thankful for the call.

## 2024-01-17 ENCOUNTER — Telehealth: Payer: Self-pay | Admitting: Pharmacist

## 2024-01-17 NOTE — Telephone Encounter (Signed)
 Appeal has been submitted for Aimovig . Will advise when response is received, please be advised that most companies may take 30 days to make a decision. Appeal letter and supporting documentation have been faxed to  [phone] on 01/17/2024 @10 :28 am.  Thank you, Dene Fines, PharmD Clinical Pharmacist  Center  Direct Dial: [phone]

## 2024-01-17 NOTE — Telephone Encounter (Signed)
 I will forward to our pharmacist for an appeals review.

## 2024-01-19 ENCOUNTER — Encounter: Payer: Self-pay | Admitting: Cardiology

## 2024-01-19 ENCOUNTER — Ambulatory Visit: Attending: Cardiology | Admitting: Cardiology

## 2024-01-19 VITALS — BP 128/70 | HR 67 | Ht 62.0 in | Wt 172.0 lb

## 2024-01-19 DIAGNOSIS — E782 Mixed hyperlipidemia: Secondary | ICD-10-CM | POA: Diagnosis not present

## 2024-01-19 DIAGNOSIS — Z79899 Other long term (current) drug therapy: Secondary | ICD-10-CM | POA: Diagnosis not present

## 2024-01-19 DIAGNOSIS — R002 Palpitations: Secondary | ICD-10-CM | POA: Diagnosis not present

## 2024-01-19 DIAGNOSIS — I10 Essential (primary) hypertension: Secondary | ICD-10-CM | POA: Diagnosis not present

## 2024-01-19 DIAGNOSIS — R0789 Other chest pain: Secondary | ICD-10-CM | POA: Diagnosis not present

## 2024-01-19 MED ORDER — LOSARTAN POTASSIUM 50 MG PO TABS
50.0000 mg | ORAL_TABLET | Freq: Every day | ORAL | 6 refills | Status: AC
Start: 2024-01-19 — End: ?

## 2024-01-19 NOTE — Patient Instructions (Signed)
 Medication Instructions:   Stop Amlodipine  (Norvasc ) Increase Losartan  to 50mg  daily  Continue all other medications.     Labwork:  BMET - order given Please do in 2 weeks  Office will contact with results via phone, letter or mychart.     Testing/Procedures:  none  Follow-Up:  6 months   Any Other Special Instructions Will Be Listed Below (If Applicable).   If you need a refill on your cardiac medications before your next appointment, please call your pharmacy.

## 2024-01-19 NOTE — Progress Notes (Signed)
 Clinical Summary Ms. Biagioni is a 63 y.o.female seen today for follow up of the following medical problems.    1. Chest pain   - normal stress test 08/2016   06/2022: coronary Ca score 3, 69th percentile, mild nonobstructive CAD - no specific chest pains recently     2.Palpitaitons 10/2020 event monitor: rare ectopy isolated 4 beat run of SVT - compliant with propranolol , which she is also on for migraines.   - no recent palpitations    3. OSA - awaiting cpap       4. HTN - compliant with meds   5. Hyperlipidemia -06/2021 TC 169 TG 160 HDL 51 LDL 90 - 02/2023 TC 782 TG 956 HDL 56 LDL 97 - 10/2023 TC 213 TG 93 HDL 55 LDL 99   6. LE edema  Reprots recenty bilateral LE edema, mild DOE  01/2022 echo: LVEF 60-65%, no WMAs, normal diastolic function, normal RV. Mild MR.  - some LE edema at times.   7. Arnold-Chiari malformation, s/p cervical fusion, with hydrocephalus  - has VP shunt followed by neurosurg   Past Medical History:  Diagnosis Date   Allergy    Anxiety    Arnold-Chiari malformation (HCC)    Arthritis    Borderline diabetic    Chronic headaches    Colon polyps    nonadenomatous   Depression    Diabetes mellitus without complication (HCC)    Diverticulosis    Esophageal stricture    FROZEN LEFT SHOULDER 04/01/2010   Qualifier: Diagnosis of  By: Phyllis Breeze MD, Arvel Lather     GERD (gastroesophageal reflux disease)    HTN (hypertension)    Hyperlipidemia    IBS (irritable bowel syndrome)    Seizures (HCC)    pt not sure when her last seizure was- she says she just spaces out- no shaking but she could not tell me last seizure-  on Oxtellar bid for seizures    Sleep apnea    no cpap now- did use 02 with cpap but has not used either in "years"     Allergies  Allergen Reactions   Cymbalta [Duloxetine Hcl] Itching    Nasal itching   Methocarbamol     Propoxyphene N-Acetaminophen Itching   Sulfa Antibiotics      Current Outpatient Medications   Medication Sig Dispense Refill   ACCU-CHEK GUIDE TEST test strip USE TO TEST BLOOD GLUCOSE THREE TIMES DAILY 100 strip 3   Accu-Chek Softclix Lancets lancets 1 each by Other route daily. as directed 100 each 3   AIMOVIG  140 MG/ML SOAJ Inject 140 mg into the skin every 30 (thirty) days. 3 mL 3   amLODipine  (NORVASC ) 5 MG tablet Take 1 tablet (5 mg total) by mouth daily. 90 tablet 3   aspirin EC 81 MG tablet Take 2 tablets by mouth daily.     atorvastatin  (LIPITOR) 20 MG tablet Take 1 tablet (20 mg total) by mouth daily. 90 tablet 3   Blood Glucose Monitoring Suppl DEVI 1 each by Does not apply route in the morning, at noon, and at bedtime. May substitute to any manufacturer covered by patient's insurance. 1 each 0   cetirizine  (ZYRTEC ) 10 MG tablet TAKE 1 TABLET EVERY DAY 90 tablet 0   cyanocobalamin  (VITAMIN B12) 1000 MCG/ML injection ADMINISTER 1 ML(1000 MCG) UNDER THE SKIN 1 TIME A WEEK 10 mL 0   gabapentin  (NEURONTIN ) 300 MG capsule Take 2 capsules (600 mg total) by mouth at bedtime. 60 capsule  11   losartan  (COZAAR ) 25 MG tablet Take 1 tablet (25 mg total) by mouth daily. 90 tablet 3   Oxcarbazepine  (TRILEPTAL ) 300 MG tablet Take 1 tablet (300 mg total) by mouth 2 (two) times daily. 180 tablet 3   polyethylene glycol powder (GLYCOLAX /MIRALAX ) 17 GM/SCOOP powder Take 17 g by mouth 2 (two) times daily as needed. 3350 g 1   propranolol  (INDERAL ) 60 MG tablet TAKE 1 TABLET EVERY MORNING 90 tablet 3   propranolol  (INDERAL ) 80 MG tablet TAKE 1 TABLET NIGHTLY TO BE TAKEN IN ADDITION TO 60 MG EVERY MORNING. 90 tablet 0   Semaglutide , 1 MG/DOSE, (OZEMPIC , 1 MG/DOSE,) 4 MG/3ML SOPN INJECT 1 MG AS DIRECTED ONCE A WEEK. 3 mL 3   sennosides-docusate sodium  (SENOKOT-S) 8.6-50 MG tablet Take 1 tablet by mouth daily. 30 tablet 2   UNABLE TO FIND Aleve  pain relieving lotion-daily     VITAMIN D  PO Take 10,000 Units by mouth daily.     No current facility-administered medications for this visit.     Past  Surgical History:  Procedure Laterality Date   BREAST BIOPSY Left    cahri decompression  6 and 03/1999   CHOLECYSTECTOMY     COLONOSCOPY     invasive cervical traction  02/2009   left breast-lumpectomy     POLYPECTOMY     shunt put in on right side of brain     SHUNT REPLACEMENT  12/10   TOTAL ABDOMINAL HYSTERECTOMY  Age 64 yrs   TAH & BSO   TUBAL LIGATION     UPPER GASTROINTESTINAL ENDOSCOPY       Allergies  Allergen Reactions   Cymbalta [Duloxetine Hcl] Itching    Nasal itching   Methocarbamol     Propoxyphene N-Acetaminophen Itching   Sulfa Antibiotics       Family History  Problem Relation Age of Onset   Colon polyps Mother    Breast cancer Mother    Lung cancer Mother    Heart disease Father    Stroke Father    Aortic aneurysm Father    Chiari malformation Daughter    Addison's disease Son    Breast cancer Maternal Aunt    Colon cancer Neg Hx    Esophageal cancer Neg Hx    Rectal cancer Neg Hx    Stomach cancer Neg Hx      Social History Ms. Braley reports that she quit smoking about 33 years ago. Her smoking use included cigarettes. She has never been exposed to tobacco smoke. She has never used smokeless tobacco. Ms. Amedeo reports no history of alcohol use.    Physical Examination Today's Vitals   01/19/24 1258  BP: 128/70  Pulse: 67  SpO2: 96%  Weight: 172 lb (78 kg)  Height: 5\' 2"  (1.575 m)   Body mass index is 31.46 kg/m.  Gen: resting comfortably, no acute distress HEENT: no scleral icterus, pupils equal round and reactive, no palptable cervical adenopathy,  CV: RRR, no m/r,g no jvd Resp: Clear to auscultation bilaterally GI: abdomen is soft, non-tender, non-distended, normal bowel sounds, no hepatosplenomegaly MSK: extremities are warm, no edema.  Skin: warm, no rash Neuro:  no focal deficits Psych: appropriate affect   Diagnostic Studies  08/2016 Nuclear stress test No diagnostic ST segment changes to indicate ischemia. No  significant myocardial perfusion defects to indicate scar or ischemia. This is a low risk study. Nuclear stress EF: 80%.   10/2020 monitor 7 day monitor No reported patient symptoms or triggered  events Rare supraventricular and ventricular ectopy. Isolated 4 beat run of SVT     Patch Wear Time:  7 days and 2 hours (2022-02-02T15:09:51-0500 to 2022-02-09T17:54:08-499)   Patient had a min HR of 48 bpm, max HR of 114 bpm, and avg HR of 69 bpm. Predominant underlying rhythm was Sinus Rhythm. 1 run of Supraventricular Tachycardia occurred lasting 4 beats with a max rate of 114 bpm (avg 111 bpm). Isolated SVEs were rare  (<1.0%), SVE Couplets were rare (<1.0%), and SVE Triplets were rare (<1.0%). Isolated VEs were rare (<1.0%), VE Couplets were rare (<1.0%), and no VE Triplets were present.      01/2022 echo  1. Left ventricular ejection fraction, by estimation, is 60 to 65%. The  left ventricle has normal function. The left ventricle has no regional  wall motion abnormalities. Left ventricular diastolic parameters were  normal.   2. Right ventricular systolic function is normal. The right ventricular  size is normal. There is normal pulmonary artery systolic pressure. The  estimated right ventricular systolic pressure is 26.0 mmHg.   3. The mitral valve is grossly normal. Mild mitral valve regurgitation.   4. The aortic valve is tricuspid. There is mild calcification of the  aortic valve. Aortic valve regurgitation is not visualized. Aortic valve  sclerosis is present, with no evidence of aortic valve stenosis. Aortic  valve mean gradient measures 2.5 mmHg.   5. The inferior vena cava is normal in size with greater than 50%  respiratory variability, suggesting right atrial pressure of 3 mmHg.        06/2022 coronary CTA IMPRESSION: 1. Coronary artery calcium  score 3.36 Agatston units. This places the patient in the 69th percentile for age and gender, suggesting intermediate risk for  future cardiac events.   2.  Mild nonobstructive CAD.   Assessment and Plan  1. Chest pain - recent coronary CTA without significant coronary disease.  - no recent symptoms, continue to monitor   2. HTN - at goal, due to some LE edema will d/c norvasc  and increase losartan  to see if edema improves.    3. Palpitations - controlled with propanolol which she is also on for history of migraines - no symptoms, continue current meds   4. HLD - lipids are at goal, continue current meds  F/u 6months, if doing well at that time then f/u 1 year.      Laurann Pollock, M.D.

## 2024-01-23 NOTE — Telephone Encounter (Signed)
 The appeal for Aimovig  has been approved by the insurance:    Thank you, Dene Fines, PharmD Clinical Pharmacist  [facility]  Direct Dial: [phone]

## 2024-01-25 DIAGNOSIS — G4733 Obstructive sleep apnea (adult) (pediatric): Secondary | ICD-10-CM | POA: Diagnosis not present

## 2024-01-31 DIAGNOSIS — H5713 Ocular pain, bilateral: Secondary | ICD-10-CM | POA: Diagnosis not present

## 2024-01-31 DIAGNOSIS — G935 Compression of brain: Secondary | ICD-10-CM | POA: Diagnosis not present

## 2024-01-31 DIAGNOSIS — D320 Benign neoplasm of cerebral meninges: Secondary | ICD-10-CM | POA: Diagnosis not present

## 2024-01-31 DIAGNOSIS — H538 Other visual disturbances: Secondary | ICD-10-CM | POA: Diagnosis not present

## 2024-02-13 ENCOUNTER — Ambulatory Visit
Admission: EM | Admit: 2024-02-13 | Discharge: 2024-02-13 | Disposition: A | Discharge status: Home / Self Care | Attending: Nurse Practitioner | Admitting: Nurse Practitioner

## 2024-02-13 ENCOUNTER — Other Ambulatory Visit: Payer: Self-pay | Admitting: Internal Medicine

## 2024-02-13 DIAGNOSIS — Z87440 Personal history of urinary (tract) infections: Secondary | ICD-10-CM | POA: Diagnosis not present

## 2024-02-13 DIAGNOSIS — N3000 Acute cystitis without hematuria: Secondary | ICD-10-CM | POA: Diagnosis not present

## 2024-02-13 LAB — POCT URINALYSIS DIP (MANUAL ENTRY)
Bilirubin, UA: NEGATIVE
Blood, UA: NEGATIVE
Glucose, UA: NEGATIVE mg/dL
Ketones, POC UA: NEGATIVE mg/dL
Nitrite, UA: POSITIVE — AB
Protein Ur, POC: 30 mg/dL — AB
Spec Grav, UA: 1.025
Urobilinogen, UA: 1 U/dL
pH, UA: 7

## 2024-02-13 MED ORDER — CEPHALEXIN 250 MG PO CAPS: 250.0000 mg | ORAL_CAPSULE | Freq: Four times a day (QID) | ORAL | 0 refills | Status: DC

## 2024-02-13 NOTE — ED Provider Notes (Signed)
 [facility]    CSN: [id number] Arrival date & time: 02/13/24  1302      History   Chief Complaint Chief Complaint  Patient presents with   Urinary Frequency    HPI MEMORI SAMMON is a 63 y.o. female.   The history is provided by the patient.   Patient presents with a 2-week history of urinary frequency, burning with urination, malodorous and dark urine, low back pain, and a fruity smell to her urine.  Patient reports that she did have a fever around 100.5 earlier this morning.  Patient denies chills, abdominal pain, flank pain, decreased urine stream, hematuria or vaginal symptoms.  Patient reports prior history of recurrent urinary tract infections.  States she has not taken any medication for her symptoms.  Past Medical History:  Diagnosis Date   Allergy    Anxiety    Arnold-Chiari malformation (HCC)    Arthritis    Borderline diabetic    Chronic headaches    Colon polyps    nonadenomatous   Depression    Diabetes mellitus without complication (HCC)    Diverticulosis    Esophageal stricture    FROZEN LEFT SHOULDER 04/01/2010   Qualifier: Diagnosis of  By: Phyllis Breeze MD, Arvel Lather     GERD (gastroesophageal reflux disease)    HTN (hypertension)    Hyperlipidemia    IBS (irritable bowel syndrome)    Seizures (HCC)    pt not sure when her last seizure was- she says she just spaces out- no shaking but she could not tell me last seizure-  on Oxtellar bid for seizures    Sleep apnea    no cpap now- did use 02 with cpap but has not used either in "years"    Patient Active Problem List   Diagnosis Date Noted   Vertigo 07/15/2023   Need for antibiotic prophylaxis for dental procedure 05/24/2023   Proteinuria 05/19/2023   Non-recurrent acute suppurative otitis media of right ear without spontaneous rupture of tympanic membrane 02/15/2023   Physical deconditioning 02/15/2023   Urinary tract infection without hematuria 10/07/2022   Gastroesophageal reflux  disease 10/07/2022   Gait abnormality 09/21/2022   Refused influenza vaccine 06/07/2022   Encounter for general adult medical examination with abnormal findings 02/03/2022   Chronic idiopathic constipation 10/06/2021   Morbid obesity (HCC) 07/10/2021   Bilateral carpal tunnel syndrome 07/06/2021   Polyarthritis 07/06/2021   Localization-related epilepsy (HCC) 08/26/2020   History of Chiari malformation 08/12/2020   Primary osteoarthritis of both hands 08/05/2020   DDD (degenerative disc disease), lumbar 08/05/2020   Mixed stress and urge urinary incontinence 07/09/2020   Insomnia 04/21/2020   Hyperlipidemia associated with type 2 diabetes mellitus (HCC) 11/22/2019   Seborrheic keratoses 11/22/2019   Essential hypertension 08/27/2019   DM type 2 with diabetic peripheral neuropathy (HCC) 08/27/2019   Migraine 08/27/2019   Bulging of thoracic intervertebral disc without myelopathy 10/01/2015   S/P VP shunt 03/15/2015   S/P cervical spinal fusion 03/15/2013   Hydrocephalus (HCC) 12/12/2012   Tinnitus 02/14/2012   Cervical spondylosis with myelopathy 01/15/2012   Sleep apnea 04/01/2010    Past Surgical History:  Procedure Laterality Date   BREAST BIOPSY Left    cahri decompression  6 and 03/1999   CHOLECYSTECTOMY     COLONOSCOPY     invasive cervical traction  02/2009   left breast-lumpectomy     POLYPECTOMY     shunt put in on right side of brain  SHUNT REPLACEMENT  12/10   TOTAL ABDOMINAL HYSTERECTOMY  Age 37 yrs   TAH & BSO   TUBAL LIGATION     UPPER GASTROINTESTINAL ENDOSCOPY      OB History     Gravida  3   Para  3   Term  3   Preterm      AB      Living  3      SAB      IAB      Ectopic      Multiple      Live Births  3            Home Medications    Prior to Admission medications   Medication Sig Start Date End Date Taking? Authorizing Provider  AIMOVIG  140 MG/ML SOAJ Inject 140 mg into the skin every 30 (thirty) days. 12/29/23  Yes  Johny Nap, NP  aspirin EC 81 MG tablet Take 2 tablets by mouth daily.   Yes [provider]  atorvastatin  (LIPITOR) 20 MG tablet Take 1 tablet (20 mg total) by mouth daily. 06/29/23  Yes Meldon Sport, MD  Blood Glucose Monitoring Suppl DEVI 1 each by Does not apply route in the morning, at noon, and at bedtime. May substitute to any manufacturer covered by patient's insurance. 02/15/23  Yes Meldon Sport, MD  cephALEXin (KEFLEX) 250 MG capsule Take 1 capsule (250 mg total) by mouth 4 (four) times daily. 02/13/24  Yes Leath-Warren, Belen Bowers, NP  cyanocobalamin  (VITAMIN B12) 1000 MCG/ML injection ADMINISTER 1 ML(1000 MCG) UNDER THE SKIN 1 TIME A WEEK 12/12/23  Yes Meldon Sport, MD  gabapentin  (NEURONTIN ) 300 MG capsule Take 2 capsules (600 mg total) by mouth at bedtime. 12/29/23  Yes McCue, Camilo Cella, NP  losartan  (COZAAR ) 50 MG tablet Take 1 tablet (50 mg total) by mouth daily. 01/19/24  Yes Branch, Joyceann No, MD  Oxcarbazepine  (TRILEPTAL ) 300 MG tablet Take 1 tablet (300 mg total) by mouth 2 (two) times daily. 12/29/23  Yes McCue, Camilo Cella, NP  pantoprazole  (PROTONIX ) 40 MG tablet Take 40 mg by mouth daily. 12/28/23  Yes [provider]  propranolol  (INDERAL ) 60 MG tablet TAKE 1 TABLET EVERY MORNING 10/31/23  Yes Lasalle Pointer, NP  Semaglutide , 1 MG/DOSE, (OZEMPIC , 1 MG/DOSE,) 4 MG/3ML SOPN INJECT 1 MG AS DIRECTED ONCE A WEEK. 12/19/23  Yes Meldon Sport, MD  sennosides-docusate sodium  (SENOKOT-S) 8.6-50 MG tablet Take 1 tablet by mouth daily. 10/05/22  Yes Meldon Sport, MD  VITAMIN D  PO Take 10,000 Units by mouth daily.   Yes [provider]  ACCU-CHEK GUIDE TEST test strip USE TO TEST BLOOD GLUCOSE THREE TIMES DAILY 08/24/23   Meldon Sport, MD  Accu-Chek Softclix Lancets lancets 1 each by Other route daily. as directed 03/03/23   Meldon Sport, MD  cetirizine  (ZYRTEC ) 10 MG tablet TAKE 1 TABLET EVERY DAY 06/11/22   Patel, Rutwik K, MD  polyethylene glycol powder  (GLYCOLAX /MIRALAX ) 17 GM/SCOOP powder Take 17 g by mouth 2 (two) times daily as needed. 10/06/21   Meldon Sport, MD  propranolol  (INDERAL ) 80 MG tablet TAKE 1 TABLET NIGHTLY TO BE TAKEN IN ADDITION TO 60 MG EVERY MORNING. 12/26/23   Laurann Pollock, MD  UNABLE TO FIND Aleve  pain relieving lotion-daily    [provider]    Family History Family History  Problem Relation Age of Onset   Colon polyps Mother    Breast cancer Mother  Lung cancer Mother    Heart disease Father    Stroke Father    Aortic aneurysm Father    Chiari malformation Daughter    Addison's disease Son    Breast cancer Maternal Aunt    Colon cancer Neg Hx    Esophageal cancer Neg Hx    Rectal cancer Neg Hx    Stomach cancer Neg Hx     Social History Social History   Tobacco Use   Smoking status: Former    Current packs/day: 0.00    Types: Cigarettes    Quit date: 09/13/1990    Years since quitting: 33.4    Passive exposure: Never   Smokeless tobacco: Never  Vaping Use   Vaping status: Never Used  Substance Use Topics   Alcohol use: No   Drug use: No     Allergies   Cymbalta [duloxetine hcl], Methocarbamol , Propoxyphene n-acetaminophen, and Sulfa antibiotics   Review of Systems Review of Systems Per HPI  Physical Exam Triage Vital Signs ED Triage Vitals  Encounter Vitals Group     BP 02/13/24 1316 127/79     Systolic BP Percentile --      Diastolic BP Percentile --      Pulse Rate 02/13/24 1316 94     Resp 02/13/24 1316 18     Temp 02/13/24 1316 99 F (37.2 C)     Temp Source 02/13/24 1316 Oral     SpO2 02/13/24 1316 93 %     Weight --      Height --      Head Circumference --      Peak Flow --      Pain Score 02/13/24 1318 2     Pain Loc --      Pain Education --      Exclude from Growth Chart --    No data found.  Updated Vital Signs BP 127/79 (BP Location: Right Arm)   Pulse 94   Temp 99 F (37.2 C) (Oral)   Resp 18   SpO2 93%   Visual Acuity Right Eye  Distance:   Left Eye Distance:   Bilateral Distance:    Right Eye Near:   Left Eye Near:    Bilateral Near:     Physical Exam Vitals and nursing note reviewed.  Constitutional:      General: She is not in acute distress.    Appearance: Normal appearance.  HENT:     Head: Normocephalic.  Eyes:     Extraocular Movements: Extraocular movements intact.     Conjunctiva/sclera: Conjunctivae normal.     Pupils: Pupils are equal, round, and reactive to light.  Cardiovascular:     Rate and Rhythm: Regular rhythm.     Pulses: Normal pulses.     Heart sounds: Normal heart sounds.  Pulmonary:     Effort: Pulmonary effort is normal.     Breath sounds: Normal breath sounds.  Abdominal:     General: Bowel sounds are normal.     Palpations: Abdomen is soft.     Tenderness: There is abdominal tenderness in the suprapubic area. There is no right CVA tenderness or left CVA tenderness.  Musculoskeletal:     Cervical back: Normal range of motion.  Lymphadenopathy:     Cervical: No cervical adenopathy.  Skin:    General: Skin is warm and dry.  Neurological:     General: No focal deficit present.     Mental Status: She is alert and oriented  to person, place, and time.  Psychiatric:        Mood and Affect: Mood normal.        Behavior: Behavior normal.      UC Treatments / Results  Labs (all labs ordered are listed, but only abnormal results are displayed) Labs Reviewed  POCT URINALYSIS DIP (MANUAL ENTRY) - Abnormal; Notable for the following components:      Result Value   Clarity, UA turbid (*)    Protein Ur, POC =30 (*)    Nitrite, UA Positive (*)    Leukocytes, UA Small (1+) (*)    All other components within normal limits  URINE CULTURE    EKG   Radiology No results found.  Procedures Procedures (including critical care time)  Medications Ordered in UC Medications - No data to display  Initial Impression / Assessment and Plan / UC Course  I have reviewed the  triage vital signs and the nursing notes.  Pertinent labs & imaging results that were available during my care of the patient were reviewed by me and considered in my medical decision making (see chart for details).  On exam, lung sounds are clear throughout, patient does not have any bilateral CVA tenderness.  She is well-appearing and is in no acute distress.  Urinalysis consistent with UTI.  Will treat for complicated UTI with Keflex 500 mg 4 times daily for the next 7 days.  Urine culture is pending.  Supportive care recommendations were provided and discussed with the patient to include fluids, rest, developing a toileting schedule, and avoiding caffeine.  Patient was given strict ER follow-up precautions.  Patient was in agreement with this plan of care and verbalizes understanding.  All questions were answered.  Patient stable for discharge.  Final Clinical Impressions(s) / UC Diagnoses   Final diagnoses:  Acute cystitis without hematuria     Discharge Instructions      - The urinalysis shows you have a urinary tract infection.  A urine culture has been ordered.  You will be contacted if the results of the culture show that the medication prescribed today needs to be changed.  You also have access to your results via MyChart. -Take medications as prescribed. -Increase fluids. -Tylenol for pain, fever, or general discomfort. -Develop a toileting schedule that will allow you to urinate at least every 2 hours. -Avoid caffeine to include tea, soda, and coffee. -If sexually active, void at least 15 to 20 minutes after sexual intercourse. -Follow-up in the emergency department if you develop worsening urinary symptoms,  fever, chills, worsening abdominal pain, or other concerns.  -Follow-up as needed.    ED Prescriptions     Medication Sig Dispense Auth. Provider   cephALEXin (KEFLEX) 250 MG capsule Take 1 capsule (250 mg total) by mouth 4 (four) times daily. 28 capsule Leath-Warren,  Belen Bowers, NP      PDMP not reviewed this encounter.   Hardy Lia, NP 02/13/24 1402

## 2024-02-13 NOTE — ED Triage Notes (Signed)
 Urinary frequency, burning with urination, odor to urine and dark, lower back pain, pt states her urine smells like fruit x 2 weeks. Pt says her temperature was 100.5 this morning.

## 2024-02-13 NOTE — Discharge Instructions (Addendum)
-   The urinalysis shows you have a urinary tract infection.  A urine culture has been ordered.  You will be contacted if the results of the culture show that the medication prescribed today needs to be changed.  You also have access to your results via MyChart. -Take medications as prescribed. -Increase fluids. -Tylenol for pain, fever, or general discomfort. -Develop a toileting schedule that will allow you to urinate at least every 2 hours. -Avoid caffeine to include tea, soda, and coffee. -If sexually active, void at least 15 to 20 minutes after sexual intercourse. -Follow-up in the emergency department if you develop worsening urinary symptoms,  fever, chills, worsening abdominal pain, or other concerns.  -Follow-up as needed.

## 2024-02-15 ENCOUNTER — Ambulatory Visit: Payer: Self-pay

## 2024-02-15 LAB — URINE CULTURE: Culture: >=100000 — AB

## 2024-02-20 ENCOUNTER — Other Ambulatory Visit (HOSPITAL_COMMUNITY): Payer: Self-pay

## 2024-02-20 ENCOUNTER — Ambulatory Visit (INDEPENDENT_AMBULATORY_CARE_PROVIDER_SITE_OTHER)

## 2024-02-20 VITALS — Ht 62.0 in | Wt 172.0 lb

## 2024-02-20 DIAGNOSIS — G919 Hydrocephalus, unspecified: Secondary | ICD-10-CM

## 2024-02-20 DIAGNOSIS — Z0000 Encounter for general adult medical examination without abnormal findings: Secondary | ICD-10-CM

## 2024-02-20 DIAGNOSIS — Z982 Presence of cerebrospinal fluid drainage device: Secondary | ICD-10-CM

## 2024-02-20 DIAGNOSIS — Z8669 Personal history of other diseases of the nervous system and sense organs: Secondary | ICD-10-CM

## 2024-02-20 DIAGNOSIS — I10 Essential (primary) hypertension: Secondary | ICD-10-CM

## 2024-02-20 DIAGNOSIS — R269 Unspecified abnormalities of gait and mobility: Secondary | ICD-10-CM

## 2024-02-20 DIAGNOSIS — G40109 Localization-related (focal) (partial) symptomatic epilepsy and epileptic syndromes with simple partial seizures, not intractable, without status epilepticus: Secondary | ICD-10-CM

## 2024-02-20 DIAGNOSIS — E1142 Type 2 diabetes mellitus with diabetic polyneuropathy: Secondary | ICD-10-CM

## 2024-02-20 DIAGNOSIS — E1169 Type 2 diabetes mellitus with other specified complication: Secondary | ICD-10-CM

## 2024-02-20 DIAGNOSIS — E119 Type 2 diabetes mellitus without complications: Secondary | ICD-10-CM

## 2024-02-20 DIAGNOSIS — R5381 Other malaise: Secondary | ICD-10-CM

## 2024-02-20 DIAGNOSIS — H906 Mixed conductive and sensorineural hearing loss, bilateral: Secondary | ICD-10-CM

## 2024-02-20 MED ORDER — BLOOD PRESSURE MONITOR 3 DEVI: 0 refills | Status: DC

## 2024-02-20 NOTE — Progress Notes (Signed)
 Subjective:   Amanda Webster is a 63 y.o. who presents for a Medicare Wellness preventive visit.  As a reminder, Annual Wellness Visits don't include a physical exam, and some assessments may be limited, especially if this visit is performed virtually. We may recommend an in-person follow-up visit with your provider if needed.  Visit Complete: Virtual I connected with  Amanda Webster on 02/20/24 by a audio enabled telemedicine application and verified that I am speaking with the correct person using two identifiers.  Patient Location: Home  Provider Location: Home Office  I discussed the limitations of evaluation and management by telemedicine. The patient expressed understanding and agreed to proceed.  Vital Signs: Because this visit was a virtual/telehealth visit, some criteria may be missing or patient reported. Any vitals not documented were not able to be obtained and vitals that have been documented are patient reported.  VideoDeclined- This patient declined Librarian, academic. Therefore the visit was completed with audio only.  Persons Participating in Visit: Patient.  AWV Questionnaire: No: Patient Medicare AWV questionnaire was not completed prior to this visit.  Cardiac Risk Factors include: advanced age (>27men, >52 women);diabetes mellitus;dyslipidemia;hypertension;obesity (BMI >30kg/m2);Other (see comment), Risk factor comments: OSA, seizures     Objective:     Today's Vitals   02/20/24 1029 02/20/24 1036  Weight: 172 lb (78 kg)   Height: 5\' 2"  (1.575 m)   PainSc:  7    Body mass index is 31.46 kg/m.     12/09/2022    3:17 PM 12/07/2021    2:39 PM 07/20/2016    3:46 PM 07/18/2016    5:02 AM  Advanced Directives  Does Patient Have a Medical Advance Directive? No No No No  Would patient like information on creating a medical advance directive? No - Patient declined Yes (ED - Information included in AVS)      Current  Medications (verified) Outpatient Encounter Medications as of 02/20/2024  Medication Sig   ACCU-CHEK GUIDE TEST test strip USE TO TEST BLOOD GLUCOSE THREE TIMES DAILY   Accu-Chek Softclix Lancets lancets 1 each by Other route daily. as directed   AIMOVIG  140 MG/ML SOAJ Inject 140 mg into the skin every 30 (thirty) days.   aspirin EC 81 MG tablet Take 2 tablets by mouth daily.   atorvastatin  (LIPITOR) 20 MG tablet Take 1 tablet (20 mg total) by mouth daily.   Blood Glucose Monitoring Suppl DEVI 1 each by Does not apply route in the morning, at noon, and at bedtime. May substitute to any manufacturer covered by patient's insurance.   Blood Pressure Monitoring (BLOOD PRESSURE MONITOR 3) DEVI Check blood pressure once daily 1 hour after taking blood pressure medication   cephALEXin  (KEFLEX ) 250 MG capsule Take 1 capsule (250 mg total) by mouth 4 (four) times daily.   cetirizine  (ZYRTEC ) 10 MG tablet TAKE 1 TABLET EVERY DAY   cyanocobalamin  (VITAMIN B12) 1000 MCG/ML injection ADMINISTER 1 ML(1000 MCG) UNDER THE SKIN 1 TIME A WEEK   gabapentin  (NEURONTIN ) 300 MG capsule Take 2 capsules (600 mg total) by mouth at bedtime.   losartan  (COZAAR ) 50 MG tablet Take 1 tablet (50 mg total) by mouth daily.   Oxcarbazepine  (TRILEPTAL ) 300 MG tablet Take 1 tablet (300 mg total) by mouth 2 (two) times daily.   pantoprazole  (PROTONIX ) 40 MG tablet Take 40 mg by mouth daily.   polyethylene glycol powder (GLYCOLAX /MIRALAX ) 17 GM/SCOOP powder Take 17 g by mouth 2 (two) times daily as  needed.   propranolol  (INDERAL ) 60 MG tablet TAKE 1 TABLET EVERY MORNING   propranolol  (INDERAL ) 80 MG tablet TAKE 1 TABLET NIGHTLY TO BE TAKEN IN ADDITION TO 60 MG EVERY MORNING.   Semaglutide , 1 MG/DOSE, (OZEMPIC , 1 MG/DOSE,) 4 MG/3ML SOPN INJECT 1 MG AS DIRECTED ONCE A WEEK.   sennosides-docusate sodium  (SENOKOT-S) 8.6-50 MG tablet Take 1 tablet by mouth daily.   UNABLE TO FIND Aleve  pain relieving lotion-daily   VITAMIN D  PO Take  10,000 Units by mouth daily.   No facility-administered encounter medications on file as of 02/20/2024.    Allergies (verified) Cymbalta [duloxetine hcl], Methocarbamol , Propoxyphene n-acetaminophen, and Sulfa antibiotics   History: Past Medical History:  Diagnosis Date   Allergy    Anxiety    Arnold-Chiari malformation (HCC)    Arthritis    Borderline diabetic    Chronic headaches    Colon polyps    nonadenomatous   Depression    Diabetes mellitus without complication (HCC)    Diverticulosis    Esophageal stricture    FROZEN LEFT SHOULDER 04/01/2010   Qualifier: Diagnosis of  By: Phyllis Breeze MD, Arvel Lather     GERD (gastroesophageal reflux disease)    HTN (hypertension)    Hyperlipidemia    IBS (irritable bowel syndrome)    Seizures (HCC)    pt not sure when her last seizure was- she says she just spaces out- no shaking but she could not tell me last seizure-  on Oxtellar bid for seizures    Sleep apnea    no cpap now- did use 02 with cpap but has not used either in "years"   Past Surgical History:  Procedure Laterality Date   BREAST BIOPSY Left    cahri decompression  8 and 03/1999   CHOLECYSTECTOMY     COLONOSCOPY     invasive cervical traction  02/2009   left breast-lumpectomy     POLYPECTOMY     shunt put in on right side of brain     SHUNT REPLACEMENT  12/10   TOTAL ABDOMINAL HYSTERECTOMY  Age 57 yrs   TAH & BSO   TUBAL LIGATION     UPPER GASTROINTESTINAL ENDOSCOPY     Family History  Problem Relation Age of Onset   Colon polyps Mother    Breast cancer Mother    Lung cancer Mother    Heart disease Father    Stroke Father    Aortic aneurysm Father    Chiari malformation Daughter    Addison's disease Son    Breast cancer Maternal Aunt    Colon cancer Neg Hx    Esophageal cancer Neg Hx    Rectal cancer Neg Hx    Stomach cancer Neg Hx    Social History   Socioeconomic History   Marital status: Married    Spouse name: Not on file   Number of children: 3    Years of education: Not on file   Highest education level: Some college, no degree  Occupational History   Not on file  Tobacco Use   Smoking status: Former    Current packs/day: 0.00    Types: Cigarettes    Quit date: 09/13/1990    Years since quitting: 33.4    Passive exposure: Never   Smokeless tobacco: Never  Vaping Use   Vaping status: Never Used  Substance and Sexual Activity   Alcohol use: No   Drug use: No   Sexual activity: Not Currently    Birth control/protection: Surgical  Comment: hyst  Other Topics Concern   Not on file  Social History Narrative   Disabled from Chiari malformation/seizures.. Drinks 4-5 cups of pepsi daily. Does not get regular exercise. Married for 40 years.   Lives with one pet, and husband.    Social Drivers of Health   Financial Resource Strain: Medium Risk (02/20/2024)   Overall Financial Resource Strain (CARDIA)    Difficulty of Paying Living Expenses: Somewhat hard  Food Insecurity: No Food Insecurity (02/20/2024)   Hunger Vital Sign    Worried About Running Out of Food in the Last Year: Never true    Ran Out of Food in the Last Year: Never true  Transportation Needs: Unmet Transportation Needs (02/20/2024)   PRAPARE - Transportation    Lack of Transportation (Medical): No    Lack of Transportation (Non-Medical): Yes  Physical Activity: Sufficiently Active (02/20/2024)   Exercise Vital Sign    Days of Exercise per Week: 7 days    Minutes of Exercise per Session: 30 min  Stress: No Stress Concern Present (02/20/2024)   [employer] of Occupational Health - Occupational Stress Questionnaire    Feeling of Stress : Not at all  Social Connections: Moderately Isolated (02/20/2024)   Social Connection and Isolation Panel [NHANES]    Frequency of Communication with Friends and Family: More than three times a week    Frequency of Social Gatherings with Friends and Family: Twice a week    Attends Religious Services: Never    Doctor, general practice or Organizations: No    Attends Engineer, structural: Never    Marital Status: Married    Tobacco Counseling Counseling given: Yes    Clinical Intake:  Pre-visit preparation completed: Yes  Pain : 0-10 Pain Score: 7  Pain Type: Acute pain Pain Location: Hip Pain Orientation: Right Pain Descriptors / Indicators: Constant, Dull, Burning Pain Onset: 1 to 4 weeks ago Pain Frequency: Constant     BMI - recorded: 31.46 Nutritional Status: BMI > 30  Obese Nutritional Risks: None Diabetes: Yes CBG done?: No Did pt. bring in CBG monitor from home?: No  Lab Results  Component Value Date   HGBA1C 5.9 (H) 10/31/2023   HGBA1C 7.0 (H) 06/23/2023   HGBA1C 5.8 (H) 02/15/2023     How often do you need to have someone help you when you read instructions, pamphlets, or other written materials from your doctor or pharmacy?: 1 - Never  Interpreter Needed?: No  Information entered by :: Rockne Chyle W CMA   Activities of Daily Living     02/20/2024   11:14 AM  In your present state of health, do you have any difficulty performing the following activities:  Hearing? 0  Vision? 0  Difficulty concentrating or making decisions? 0  Walking or climbing stairs? 1  Dressing or bathing? 0  Doing errands, shopping? 1  Preparing Food and eating ? N  Using the Toilet? N  In the past six months, have you accidently leaked urine? N  Do you have problems with loss of bowel control? N  Managing your Medications? N  Managing your Finances? N  Housekeeping or managing your Housekeeping? N    Patient Care Team: Meldon Sport, MD as PCP - General (Internal Medicine) Chen, Amalie, MD as Referring Physician (Neurology) Amanda Jungling, Joyceann No, MD as Consulting Physician (Cardiology) Ammon Kanaris [facility], NP as Nurse Practitioner (Neurosurgery) Johny Nap, NP as Nurse Practitioner (Neurology) Debbra Fairy, MD as Attending Physician (Neurology) Jeni Mitten  R, DPM as  Consulting Physician (Podiatry) Shirlyn Dowdy, Ainsley Houston, AUD (Audiology) Lea Primmer, LCSW as Social Worker (Licensed Clinical Social Worker)  I have updated your Care Teams any recent Medical Services you may have received from other providers in the past year.     Assessment:    This is a routine wellness examination for Amanda Webster.  Hearing/Vision screen Hearing Screening - Comments:: Patient needs hearing aids but can't afford them. Referral placed  Vision Screening - Comments:: Wears rx glasses - up to date with routine eye exams with  Lucendia Rusk and Apolinar Klippel ([facility]). Sometimes patient still has difficulty seeing due to pseudotumor cerebri and Arnold-Chiari malformation type 1   Goals Addressed             This Visit's Progress    Patient Stated       Improve balance and prevent future falls.        Depression Screen     02/20/2024   10:51 AM 07/15/2023    2:05 PM 06/29/2023    4:12 PM 05/19/2023    2:24 PM 05/18/2023    2:35 PM 02/15/2023    1:55 PM 12/29/2022    9:25 AM  PHQ 2/9 Scores  PHQ - 2 Score 0 4 2  2 2 1   PHQ- 9 Score 0 14 14  13 13       Information is confidential and restricted. Go to Review Flowsheets to unlock data.    Fall Risk     02/20/2024   11:15 AM 10/31/2023    4:29 PM 07/15/2023    2:08 PM 06/29/2023    4:11 PM 05/18/2023    2:35 PM  Fall Risk   Falls in the past year? 1 0 1 1 1   Number falls in past yr: 1 0 0 1 1  Injury with Fall? 1 0 1 0 0  Risk for fall due to : History of fall(s);Impaired balance/gait;Impaired mobility;Impaired vision No Fall Risks Impaired balance/gait History of fall(s)   Follow up Falls evaluation completed;Education provided;Falls prevention discussed Falls evaluation completed Follow up appointment Falls evaluation completed     MEDICARE RISK AT HOME:  Medicare Risk at Home Any stairs in or around the home?: Yes If so, are there any without handrails?: No Home free of loose throw rugs in walkways, pet beds,  electrical cords, etc?: Yes Adequate lighting in your home to reduce risk of falls?: Yes Life alert?: No Use of a cane, walker or w/c?: Yes Grab bars in the bathroom?: Yes Shower chair or bench in shower?: Yes Elevated toilet seat or a handicapped toilet?: Yes  TIMED UP AND GO:  Was the test performed?  No  Cognitive Function: 6CIT completed        02/20/2024   11:18 AM 12/09/2022    3:19 PM 12/07/2021    2:43 PM 12/01/2020    9:41 AM  6CIT Screen  What Year? 0 points 0 points 0 points 0 points  What month? 0 points 0 points 0 points 0 points  What time? 0 points 0 points 0 points 0 points  Count back from 20 0 points 0 points 0 points 0 points  Months in reverse 0 points 0 points 0 points 2 points  Repeat phrase 0 points 0 points 0 points 0 points  Total Score 0 points 0 points 0 points 2 points    Immunizations Immunization History  Administered Date(s) Administered   Tdap 07/29/2020   Zoster Recombinant(Shingrix) 10/07/2021, 02/10/2022  Screening Tests Health Maintenance  Topic Date Due   COVID-19 Vaccine (1) Never done   Pneumococcal Vaccine 16-24 Years old (1 of 2 - PCV) Never done   FOOT EXAM  11/11/2023   INFLUENZA VACCINE  04/13/2024   HEMOGLOBIN A1C  04/29/2024   Diabetic kidney evaluation - Urine ACR  06/22/2024   OPHTHALMOLOGY EXAM  08/30/2024   MAMMOGRAM  09/08/2024   Diabetic kidney evaluation - eGFR measurement  10/30/2024   Medicare Annual Wellness (AWV)  02/19/2025   Colonoscopy  12/26/2026   DTaP/Tdap/Td (2 - Td or Tdap) 07/29/2030   Hepatitis C Screening  Completed   HIV Screening  Completed   Zoster Vaccines- Shingrix  Completed   HPV VACCINES  Aged Out   Meningococcal B Vaccine  Aged Out    Health Maintenance  Health Maintenance Due  Topic Date Due   COVID-19 Vaccine (1) Never done   Pneumococcal Vaccine 61-21 Years old (1 of 2 - PCV) Never done   FOOT EXAM  11/11/2023   Health Maintenance Items Addressed: Order sent in for a home  blood pressure monitor. Message sent to pharmacy team to see if changing to a [facility] would be beneficial to patient.   Additional Screening:  Vision Screening: Recommended annual ophthalmology exams for early detection of glaucoma and other disorders of the eye. Would you like a referral to an eye doctor? No    Dental Screening: Recommended annual dental exams for proper oral hygiene  Community Resource Referral / Chronic Care Management: CRR required this visit?  Yes   CCM required this visit?  No   Plan:    I have personally reviewed and noted the following in the patient's chart:   Medical and social history Use of alcohol, tobacco or illicit drugs  Current medications and supplements including opioid prescriptions. Patient is not currently taking opioid prescriptions. Functional ability and status Nutritional status Physical activity Advanced directives List of other physicians Hospitalizations, surgeries, and ER visits in previous 12 months Vitals Screenings to include cognitive, depression, and falls Referrals and appointments  In addition, I have reviewed and discussed with patient certain preventive protocols, quality metrics, and best practice recommendations. A written personalized care plan for preventive services as well as general preventive health recommendations were provided to patient.   Raimundo Corbit, CMA   02/20/2024   After Visit Summary: (MyChart) Due to this being a telephonic visit, the after visit summary with patients personalized plan was offered to patient via MyChart   Notes: Please refer to Routing Comments.

## 2024-02-20 NOTE — Patient Instructions (Signed)
 Amanda Webster , Thank you for taking time out of your busy schedule to complete your Annual Wellness Visit with me. I enjoyed our conversation and look forward to speaking with you again next year. I, as well as your care team,  appreciate your ongoing commitment to your health goals. Please review the following plan we discussed and let me know if I can assist you in the future. Your Game plan/ To Do List    Referrals:  A referral has been placed for you to check and see what additional resources are available to you.   If you haven't heard from anyone within the next 7 business days, please call them and let them know a referral has been placed for you Phone: [phone]  Labs ordered for you: n/a this visit  Follow up Visits: Next Medicare AWV with clinical staff  February 20, 2025 at 8:00 am video visit Have you seen your provider in the last 6 months (3 months if uncontrolled diabetes)? Yes Next Office Visit with your pcp:   February 29, 2024 with Dr. Lydia Sams  Clinician Recommendations:   Aim for 30 minutes of exercise or brisk walking, 6-8 glasses of water, and 5 servings of fruits and vegetables each day.  My name is Amanda Webster. I enjoyed our conversation today and look forward to talking with you again next year!! Have a wonderful and safe year.  All the best, Amanda Webster  This is a list of the screening recommended for you and due dates:  Health Maintenance  Topic Date Due   COVID-19 Vaccine (1) Never done   Pneumococcal Vaccination (1 of 2 - PCV) Never done   Complete foot exam   11/11/2023   Flu Shot  04/13/2024   Hemoglobin A1C  04/29/2024   Yearly kidney health urinalysis for diabetes  06/22/2024   Eye exam for diabetics  08/30/2024   Mammogram  09/08/2024   Yearly kidney function blood test for diabetes  10/30/2024   Medicare Annual Wellness Visit  02/19/2025   Colon Cancer Screening  12/26/2026   DTaP/Tdap/Td vaccine (2 - Td or Tdap) 07/29/2030   Hepatitis C Screening  Completed   HIV  Screening  Completed   Zoster (Shingles) Vaccine  Completed   HPV Vaccine  Aged Out   Meningitis B Vaccine  Aged Out   Advanced directives: (Declined) Advance directive discussed with you today. Even though you declined this today, please call our office should you change your mind, and we can give you the proper paperwork for you to fill out. Advance Care Planning is important because it:  [x]  Makes sure you receive the medical care that is consistent with your values, goals, and preferences  [x]  It provides guidance to your family and loved ones and reduces their decisional burden about whether or not they are making the right decisions based on your wishes.  Follow the link provided in your after visit summary or read over the paperwork we have mailed to you to help you started getting your Advance Directives in place. If you need assistance in completing these, please reach out to us  so that we can help you! See attachments for Preventive Care and Fall Prevention Tips.  Understanding Your Risk for Falls Millions of people have serious injuries from falls each year. It is important to understand your risk of falling. Talk with your health care provider about your risk and what you can do to lower it. If you do have a serious fall, make  sure to tell your provider. Falling once raises your risk of falling again. How can falls affect me? Serious injuries from falls are common. These include: Broken bones, such as hip fractures. Head injuries, such as traumatic brain injuries (TBI) or concussions. A fear of falling can cause you to avoid activities and stay at home. This can make your muscles weaker and raise your risk for a fall. What can increase my risk? There are a number of risk factors that increase your risk for falling. The more risk factors you have, the higher your risk of falling. Serious injuries from a fall happen most often to people who are older than 61 years old. Teenagers and  young adults ages 78-29 are also at higher risk. Common risk factors include: Weakness in the lower body. Being generally weak or confused due to long-term (chronic) illness. Dizziness or balance problems. Poor vision. Medicines that cause dizziness or drowsiness. These may include: Medicines for your blood pressure, heart, anxiety, insomnia, or swelling (edema). Pain medicines. Muscle relaxants. Other risk factors include: Drinking alcohol. Having had a fall in the past. Having foot pain or wearing improper footwear. Working at a dangerous job. Having any of the following in your home: Tripping hazards, such as floor clutter or loose rugs. Poor lighting. Pets. Having dementia or memory loss. What actions can I take to lower my risk of falling?     Physical activity Stay physically fit. Do strength and balance exercises. Consider taking a regular class to build strength and balance. Yoga and tai chi are good options. Vision Have your eyes checked every year and your prescription for glasses or contacts updated as needed. Shoes and walking aids Wear non-skid shoes. Wear shoes that have rubber soles and low heels. Do not wear high heels. Do not walk around the house in socks or slippers. Use a cane or walker as told by your provider. Home safety Attach secure railings on both sides of your stairs. Install grab bars for your bathtub, shower, and toilet. Use a non-skid mat in your bathtub or shower. Attach bath mats securely with double-sided, non-slip rug tape. Use good lighting in all rooms. Keep a flashlight near your bed. Make sure there is a clear path from your bed to the bathroom. Use night-lights. Do not use throw rugs. Make sure all carpeting is taped or tacked down securely. Remove all clutter from walkways and stairways, including extension cords. Repair uneven or broken steps and floors. Avoid walking on icy or slippery surfaces. Walk on the grass instead of on icy  or slick sidewalks. Use ice melter to get rid of ice on walkways in the winter. Use a cordless phone. Questions to ask your health care provider Can you help me check my risk for a fall? Do any of my medicines make me more likely to fall? Should I take a vitamin D  supplement? What exercises can I do to improve my strength and balance? Should I make an appointment to have my vision checked? Do I need a bone density test to check for weak bones (osteoporosis)? Would it help to use a cane or a walker? Where to find more information Centers for Disease Control and Prevention, STEADI: TonerPromos.no Community-Based Fall Prevention Programs: TonerPromos.no [employer] on Aging: BaseRingTones.pl Contact a health care provider if: You fall at home. You are afraid of falling at home. You feel weak, drowsy, or dizzy. This information is not intended to replace advice given to you by your health care  provider. Make sure you discuss any questions you have with your health care provider. Document Revised: 05/03/2022 Document Reviewed: 05/03/2022 Elsevier Patient Education  2024 [employer].  Understanding Your Risk for Falls Millions of people have serious injuries from falls each year. It is important to understand your risk of falling. Talk with your health care provider about your risk and what you can do to lower it. If you do have a serious fall, make sure to tell your provider. Falling once raises your risk of falling again. How can falls affect me? Serious injuries from falls are common. These include: Broken bones, such as hip fractures. Head injuries, such as traumatic brain injuries (TBI) or concussions. A fear of falling can cause you to avoid activities and stay at home. This can make your muscles weaker and raise your risk for a fall. What can increase my risk? There are a number of risk factors that increase your risk for falling. The more risk factors you have, the higher your risk of falling.  Serious injuries from a fall happen most often to people who are older than 61 years old. Teenagers and young adults ages 48-29 are also at higher risk. Common risk factors include: Weakness in the lower body. Being generally weak or confused due to long-term (chronic) illness. Dizziness or balance problems. Poor vision. Medicines that cause dizziness or drowsiness. These may include: Medicines for your blood pressure, heart, anxiety, insomnia, or swelling (edema). Pain medicines. Muscle relaxants. Other risk factors include: Drinking alcohol. Having had a fall in the past. Having foot pain or wearing improper footwear. Working at a dangerous job. Having any of the following in your home: Tripping hazards, such as floor clutter or loose rugs. Poor lighting. Pets. Having dementia or memory loss. What actions can I take to lower my risk of falling?     Physical activity Stay physically fit. Do strength and balance exercises. Consider taking a regular class to build strength and balance. Yoga and tai chi are good options. Vision Have your eyes checked every year and your prescription for glasses or contacts updated as needed. Shoes and walking aids Wear non-skid shoes. Wear shoes that have rubber soles and low heels. Do not wear high heels. Do not walk around the house in socks or slippers. Use a cane or walker as told by your provider. Home safety Attach secure railings on both sides of your stairs. Install grab bars for your bathtub, shower, and toilet. Use a non-skid mat in your bathtub or shower. Attach bath mats securely with double-sided, non-slip rug tape. Use good lighting in all rooms. Keep a flashlight near your bed. Make sure there is a clear path from your bed to the bathroom. Use night-lights. Do not use throw rugs. Make sure all carpeting is taped or tacked down securely. Remove all clutter from walkways and stairways, including extension cords. Repair uneven or  broken steps and floors. Avoid walking on icy or slippery surfaces. Walk on the grass instead of on icy or slick sidewalks. Use ice melter to get rid of ice on walkways in the winter. Use a cordless phone. Questions to ask your health care provider Can you help me check my risk for a fall? Do any of my medicines make me more likely to fall? Should I take a vitamin D  supplement? What exercises can I do to improve my strength and balance? Should I make an appointment to have my vision checked? Do I need a bone density test  to check for weak bones (osteoporosis)? Would it help to use a cane or a walker? Where to find more information Centers for Disease Control and Prevention, STEADI: TonerPromos.no Community-Based Fall Prevention Programs: TonerPromos.no [employer] on Aging: BaseRingTones.pl Contact a health care provider if: You fall at home. You are afraid of falling at home. You feel weak, drowsy, or dizzy. This information is not intended to replace advice given to you by your health care provider. Make sure you discuss any questions you have with your health care provider. Document Revised: 05/03/2022 Document Reviewed: 05/03/2022 Elsevier Patient Education  2024 [employer].

## 2024-02-21 ENCOUNTER — Telehealth: Payer: Self-pay

## 2024-02-21 ENCOUNTER — Ambulatory Visit: Payer: Self-pay

## 2024-02-21 NOTE — [employer] Notes (Signed)
 Complex Care Management Note  Care Guide Note 02/21/2024 Name: Amanda Webster MRN: [id number] DOB: [birth year]  [name] is a 63 y.o. year old female who sees Meldon Sport, MD for primary care. I reached out to [employer] by phone today to offer complex care management services.  [name] was given information about Complex Care Management services today including:   The Complex Care Management services include support from the care team which includes your Nurse Care Manager, Clinical Social Worker, or Pharmacist.  The Complex Care Management team is here to help remove barriers to the health concerns and goals most important to you. Complex Care Management services are voluntary, and the patient may decline or stop services at any time by request to their care team member.   Complex Care Management Consent Status: Patient agreed to services and verbal consent obtained.   Follow up plan:  Telephone appointment with complex care management team member scheduled for:  03/19/2024  Encounter Outcome:  Patient Scheduled  Lenton Rail , RMA     [place]  [facility] [place], [facility] Guide  Direct Dial: [phone]  Website: [email or web address]

## 2024-02-23 ENCOUNTER — Other Ambulatory Visit (HOSPITAL_COMMUNITY): Payer: Self-pay

## 2024-02-23 ENCOUNTER — Other Ambulatory Visit: Payer: Self-pay

## 2024-02-23 MED ORDER — GLIPIZIDE ER 5 MG PO TB24
5.0000 mg | ORAL_TABLET | Freq: Every day | ORAL | 0 refills | Status: DC
  Filled 2024-02-23 (×2): qty 30, 30d supply, fill #0

## 2024-02-23 MED ORDER — GLUCOSE BLOOD VI STRP
ORAL_STRIP | 1 refills | Status: DC
  Filled 2024-02-23 (×2): qty 50, 50d supply, fill #0

## 2024-02-23 MED ORDER — PROPRANOLOL HCL 60 MG PO TABS
60.0000 mg | ORAL_TABLET | Freq: Every morning | ORAL | 1 refills | Status: DC
  Filled 2024-02-23: qty 20, 20d supply, fill #0
  Filled 2024-02-23: qty 90, 90d supply, fill #0
  Filled 2024-02-23: qty 70, 70d supply, fill #0

## 2024-02-23 MED ORDER — LOSARTAN POTASSIUM 25 MG PO TABS
25.0000 mg | ORAL_TABLET | Freq: Every day | ORAL | 2 refills | Status: DC
  Filled 2024-02-23 (×2): qty 90, 90d supply, fill #0

## 2024-02-23 MED ORDER — AMLODIPINE BESYLATE 5 MG PO TABS
5.0000 mg | ORAL_TABLET | Freq: Every day | ORAL | 2 refills | Status: DC
  Filled 2024-02-23 (×2): qty 90, 90d supply, fill #0

## 2024-02-23 MED FILL — Semaglutide Soln Pen-inj 1 MG/DOSE (4 MG/3ML): SUBCUTANEOUS | 28 days supply | Qty: 3 | Fill #0 | Status: CN

## 2024-02-24 ENCOUNTER — Other Ambulatory Visit (HOSPITAL_COMMUNITY): Payer: Self-pay

## 2024-02-25 DIAGNOSIS — G4733 Obstructive sleep apnea (adult) (pediatric): Secondary | ICD-10-CM | POA: Diagnosis not present

## 2024-02-27 ENCOUNTER — Ambulatory Visit: Payer: Medicare PPO | Admitting: Cardiology

## 2024-02-27 ENCOUNTER — Other Ambulatory Visit: Payer: Self-pay

## 2024-02-27 ENCOUNTER — Other Ambulatory Visit (HOSPITAL_COMMUNITY): Payer: Self-pay

## 2024-02-27 DIAGNOSIS — Z79899 Other long term (current) drug therapy: Secondary | ICD-10-CM | POA: Diagnosis not present

## 2024-02-27 DIAGNOSIS — I10 Essential (primary) hypertension: Secondary | ICD-10-CM | POA: Diagnosis not present

## 2024-02-27 MED ORDER — LOSARTAN POTASSIUM 50 MG PO TABS
50.0000 mg | ORAL_TABLET | Freq: Every day | ORAL | 6 refills | Status: DC
  Filled 2024-02-27: qty 30, 30d supply, fill #0
  Filled 2024-04-02: qty 30, 30d supply, fill #1
  Filled 2024-05-13: qty 30, 30d supply, fill #2
  Filled 2024-07-01: qty 30, 30d supply, fill #3
  Filled 2024-07-25: qty 30, 30d supply, fill #4

## 2024-02-27 MED ORDER — OMRON 3 SERIES BP MONITOR DEVI: 0 refills | Status: DC

## 2024-02-27 MED ORDER — GLUCOSE BLOOD VI STRP
ORAL_STRIP | 2 refills | Status: AC
  Filled 2024-05-13: qty 100, 33d supply, fill #0
  Filled 2024-07-01: qty 100, 33d supply, fill #1

## 2024-02-27 MED ORDER — PANTOPRAZOLE SODIUM 40 MG PO TBEC
40.0000 mg | DELAYED_RELEASE_TABLET | Freq: Every day | ORAL | 2 refills | Status: DC
  Filled 2024-02-27: qty 30, 30d supply, fill #0
  Filled 2024-04-02: qty 30, 30d supply, fill #1

## 2024-02-27 MED ORDER — GABAPENTIN 300 MG PO CAPS
600.0000 mg | ORAL_CAPSULE | Freq: Every day | ORAL | 11 refills | Status: DC
  Filled 2024-02-27 – 2024-02-29 (×2): qty 60, 30d supply, fill #0
  Filled 2024-04-02: qty 60, 30d supply, fill #1
  Filled 2024-05-13: qty 60, 30d supply, fill #2
  Filled 2024-06-05 – 2024-07-01 (×2): qty 60, 30d supply, fill #3
  Filled 2024-07-25 – 2024-08-04 (×2): qty 60, 30d supply, fill #4

## 2024-02-27 MED ORDER — LOSARTAN POTASSIUM 25 MG PO TABS
25.0000 mg | ORAL_TABLET | Freq: Every day | ORAL | 1 refills | Status: DC
Start: 2023-07-09 — End: 2024-02-29
  Filled 2024-02-27: qty 45, 45d supply, fill #0

## 2024-02-28 LAB — BASIC METABOLIC PANEL WITH GFR
BUN/Creatinine Ratio: 7 — ABNORMAL LOW (ref 12–28)
BUN: 6 mg/dL — ABNORMAL LOW (ref 8–27)
CO2: 22 mmol/L (ref 20–29)
Calcium: 9.6 mg/dL (ref 8.7–10.3)
Chloride: 98 mmol/L (ref 96–106)
Creatinine, Ser: 0.88 mg/dL (ref 0.57–1.00)
Glucose: 117 mg/dL — ABNORMAL HIGH (ref 70–99)
Potassium: 3.6 mmol/L (ref 3.5–5.2)
Sodium: 139 mmol/L (ref 134–144)
eGFR: 75 mL/min/{1.73_m2} (ref >59)

## 2024-02-28 LAB — LAB REPORT - SCANNED: Creatinine, POC: 6 mg/dL

## 2024-02-29 ENCOUNTER — Encounter: Payer: Medicare PPO | Admitting: Internal Medicine

## 2024-02-29 ENCOUNTER — Ambulatory Visit (INDEPENDENT_AMBULATORY_CARE_PROVIDER_SITE_OTHER): Payer: Medicare PPO | Admitting: Internal Medicine

## 2024-02-29 ENCOUNTER — Other Ambulatory Visit (HOSPITAL_COMMUNITY): Payer: Self-pay

## 2024-02-29 ENCOUNTER — Encounter: Payer: Self-pay | Admitting: Internal Medicine

## 2024-02-29 ENCOUNTER — Other Ambulatory Visit: Payer: Self-pay

## 2024-02-29 VITALS — BP 138/88 | HR 61 | Ht 62.0 in | Wt 169.6 lb

## 2024-02-29 DIAGNOSIS — Z23 Encounter for immunization: Secondary | ICD-10-CM

## 2024-02-29 DIAGNOSIS — Z0001 Encounter for general adult medical examination with abnormal findings: Secondary | ICD-10-CM

## 2024-02-29 DIAGNOSIS — G43719 Chronic migraine without aura, intractable, without status migrainosus: Secondary | ICD-10-CM

## 2024-02-29 DIAGNOSIS — R269 Unspecified abnormalities of gait and mobility: Secondary | ICD-10-CM | POA: Diagnosis not present

## 2024-02-29 DIAGNOSIS — Z7985 Long-term (current) use of injectable non-insulin antidiabetic drugs: Secondary | ICD-10-CM

## 2024-02-29 DIAGNOSIS — E1169 Type 2 diabetes mellitus with other specified complication: Secondary | ICD-10-CM

## 2024-02-29 DIAGNOSIS — E1142 Type 2 diabetes mellitus with diabetic polyneuropathy: Secondary | ICD-10-CM | POA: Diagnosis not present

## 2024-02-29 DIAGNOSIS — E785 Hyperlipidemia, unspecified: Secondary | ICD-10-CM

## 2024-02-29 DIAGNOSIS — G919 Hydrocephalus, unspecified: Secondary | ICD-10-CM

## 2024-02-29 DIAGNOSIS — I10 Essential (primary) hypertension: Secondary | ICD-10-CM

## 2024-02-29 DIAGNOSIS — E538 Deficiency of other specified B group vitamins: Secondary | ICD-10-CM | POA: Diagnosis not present

## 2024-02-29 DIAGNOSIS — E559 Vitamin D deficiency, unspecified: Secondary | ICD-10-CM

## 2024-02-29 NOTE — Assessment & Plan Note (Addendum)
 Has history of Arnold-Chiari malformation VP shunt for hydrocephalus, but had recent increased size of ventricles noted on CT of head - had reprogramming on 10/25/22, and had revision shunt placement in 04/24 as she has recurrent hydrocephalus Followed by neurosurgeon - gait disturbance could be due to hydrocephalus, advised to contact neurosurgeon if persistent gait disturbance

## 2024-02-29 NOTE — Progress Notes (Signed)
 Established Patient Office Visit  Subjective:  Patient ID: Amanda Webster, female    DOB: 06-Feb-1961  Age: 63 y.o. MRN: [id number]  CC:  Chief Complaint  Patient presents with   Medical Management of Chronic Issues    4 month f/u   Fatigue    Reports sx of fatigue with the rainy weather.     HPI ASHELYNN Webster is a 63 y.o. female with past medical history of HTN, type II DM with HLD, polyarthritis, migraine, seizure disorder, allergic rhinitis and morbid obesity who presents for f/u of her chronic medical conditions.   Type II DM: She is taking Ozempic  1 mg QW currently. Her last HbA1c was 5.9 in 02/25. She has lost about 24 lbs since starting Ozempic  1 mg qw in 09/24.  Her blood glucose has been around 100-130 lately.  She has tried metformin, but had GI discomfort with it as well.  Denies any polyuria or polyphagia currently.  HTN: Her BP is well-controlled.  She takes losartan  50 mg QD and propranolol  80 mg QAM and 60 mg QPM (mainly for migraine).  Cardiologist discontinued amlodipine  5 mg once daily due to leg swelling. She denies any dizziness, chest pain, dyspnea or palpitations currently.  Hydrocephalus: She had revision of VP shunt in 04/24 due to recurrence of hydrocephalus.  She still reports fatigue and weakness.  Her vision has improved now. She had EEG in 01/24, which was unremarkable.  She had NCS/EMG by Dr Gracie Lav, which showed mild axonal sensorimotor neuropathy.  She is already on gabapentin  600 mg qHS. She has felt progressive leg weakness lately as well, which is also due to OA of knee.  OSA: She has started using CPAP device for it, but did not tolerate different masks. She had sleep study done in the past, was ordered by Dr. Joleen Navy.  As she was not tolerating CPAP device, she was using oxygen via nasal cannula at nighttime for nocturnal hypoxia, but has stopped using it for a long time now.  She reports dyspnea at nighttime recently.  Denies chest pain or  palpitations.  Past Medical History:  Diagnosis Date   Allergy    Anxiety    Arnold-Chiari malformation (HCC)    Arthritis    Borderline diabetic    Chronic headaches    Colon polyps    nonadenomatous   Depression    Diabetes mellitus without complication (HCC)    Diverticulosis    Esophageal stricture    FROZEN LEFT SHOULDER 04/01/2010   Qualifier: Diagnosis of  By: Phyllis Breeze MD, Arvel Lather     GERD (gastroesophageal reflux disease)    HTN (hypertension)    Hyperlipidemia    IBS (irritable bowel syndrome)    Seizures (HCC)    pt not sure when her last seizure was- she says she just spaces out- no shaking but she could not tell me last seizure-  on Oxtellar bid for seizures    Sleep apnea    no cpap now- did use 02 with cpap but has not used either in years    Past Surgical History:  Procedure Laterality Date   BREAST BIOPSY Left    cahri decompression  8 and 03/1999   CHOLECYSTECTOMY     COLONOSCOPY     invasive cervical traction  02/2009   left breast-lumpectomy     POLYPECTOMY     shunt put in on right side of brain     SHUNT REPLACEMENT  12/10   TOTAL ABDOMINAL  HYSTERECTOMY  Age 10 yrs   TAH & BSO   TUBAL LIGATION     UPPER GASTROINTESTINAL ENDOSCOPY      Family History  Problem Relation Age of Onset   Colon polyps Mother    Breast cancer Mother    Lung cancer Mother    Heart disease Father    Stroke Father    Aortic aneurysm Father    Chiari malformation Daughter    Addison's disease Son    Breast cancer Maternal Aunt    Colon cancer Neg Hx    Esophageal cancer Neg Hx    Rectal cancer Neg Hx    Stomach cancer Neg Hx     Social History   Socioeconomic History   Marital status: Married    Spouse name: Not on file   Number of children: 3   Years of education: Not on file   Highest education level: Some college, no degree  Occupational History   Not on file  Tobacco Use   Smoking status: Former    Current packs/day: 0.00    Types: Cigarettes     Quit date: 09/13/1990    Years since quitting: 33.4    Passive exposure: Never   Smokeless tobacco: Never  Vaping Use   Vaping status: Never Used  Substance and Sexual Activity   Alcohol use: No   Drug use: No   Sexual activity: Not Currently    Birth control/protection: Surgical    Comment: hyst  Other Topics Concern   Not on file  Social History Narrative   Disabled from Chiari malformation/seizures.. Drinks 4-5 cups of pepsi daily. Does not get regular exercise. Married for 40 years.   Lives with one pet, and husband.    Social Drivers of Health   Financial Resource Strain: Medium Risk (02/20/2024)   Overall Financial Resource Strain (CARDIA)    Difficulty of Paying Living Expenses: Somewhat hard  Food Insecurity: No Food Insecurity (02/20/2024)   Hunger Vital Sign    Worried About Running Out of Food in the Last Year: Never true    Ran Out of Food in the Last Year: Never true  Transportation Needs: Unmet Transportation Needs (02/20/2024)   PRAPARE - Transportation    Lack of Transportation (Medical): No    Lack of Transportation (Non-Medical): Yes  Physical Activity: Sufficiently Active (02/20/2024)   Exercise Vital Sign    Days of Exercise per Week: 7 days    Minutes of Exercise per Session: 30 min  Stress: No Stress Concern Present (02/20/2024)   [employer] of Occupational Health - Occupational Stress Questionnaire    Feeling of Stress : Not at all  Social Connections: Moderately Isolated (02/20/2024)   Social Connection and Isolation Panel    Frequency of Communication with Friends and Family: More than three times a week    Frequency of Social Gatherings with Friends and Family: Twice a week    Attends Religious Services: Never    Database administrator or Organizations: No    Attends Banker Meetings: Never    Marital Status: Married  Catering manager Violence: Not At Risk (02/20/2024)   Humiliation, Afraid, Rape, and Kick questionnaire    Fear of Current  or Ex-Partner: No    Emotionally Abused: No    Physically Abused: No    Sexually Abused: No    Outpatient Medications Prior to Visit  Medication Sig Dispense Refill   ACCU-CHEK GUIDE TEST test strip USE TO TEST BLOOD GLUCOSE THREE  TIMES DAILY 100 strip 3   Accu-Chek Softclix Lancets lancets 1 each by Other route daily. as directed 100 each 3   AIMOVIG  140 MG/ML SOAJ Inject 140 mg into the skin every 30 (thirty) days. 3 mL 3   aspirin EC 81 MG tablet Take 2 tablets by mouth daily.     atorvastatin  (LIPITOR) 20 MG tablet Take 1 tablet (20 mg total) by mouth daily. 90 tablet 3   Blood Pressure Monitoring (OMRON 3 SERIES BP MONITOR) DEVI Use to check blood pressure 1 hour after taking blood pressure medication. 1 each 0   cetirizine  (ZYRTEC ) 10 MG tablet TAKE 1 TABLET EVERY DAY 90 tablet 0   gabapentin  (NEURONTIN ) 300 MG capsule Take 2 capsules (600 mg total) by mouth at bedtime. 60 capsule 11   glucose blood test strip Use to test blood glucose three times daily. 100 each 2   losartan  (COZAAR ) 50 MG tablet Take 1 tablet (50 mg total) by mouth daily. 30 tablet 6   Oxcarbazepine  (TRILEPTAL ) 300 MG tablet Take 1 tablet (300 mg total) by mouth 2 (two) times daily. 180 tablet 3   pantoprazole  (PROTONIX ) 40 MG tablet Take 1 tablet (40 mg total) by mouth daily. 30 tablet 2   polyethylene glycol powder (GLYCOLAX /MIRALAX ) 17 GM/SCOOP powder Take 17 g by mouth 2 (two) times daily as needed. 3350 g 1   propranolol  (INDERAL ) 60 MG tablet Take 1 tablet (60 mg total) by mouth in the morning. 90 tablet 1   propranolol  (INDERAL ) 80 MG tablet TAKE 1 TABLET NIGHTLY TO BE TAKEN IN ADDITION TO 60 MG EVERY MORNING. 90 tablet 0   Semaglutide , 1 MG/DOSE, (OZEMPIC , 1 MG/DOSE,) 4 MG/3ML SOPN Inject 1 mg into the skin once a week. 3 mL 3   sennosides-docusate sodium  (SENOKOT-S) 8.6-50 MG tablet Take 1 tablet by mouth daily. 30 tablet 2   UNABLE TO FIND Aleve  pain relieving lotion-daily     VITAMIN D  PO Take 10,000  Units by mouth daily.     Blood Glucose Monitoring Suppl DEVI 1 each by Does not apply route in the morning, at noon, and at bedtime. May substitute to any manufacturer covered by patient's insurance. 1 each 0   Blood Pressure Monitoring (BLOOD PRESSURE MONITOR 3) DEVI Check blood pressure once daily 1 hour after taking blood pressure medication 1 each 0   cephALEXin  (KEFLEX ) 250 MG capsule Take 1 capsule (250 mg total) by mouth 4 (four) times daily. 28 capsule 0   cyanocobalamin  (VITAMIN B12) 1000 MCG/ML injection ADMINISTER 1 ML(1000 MCG) UNDER THE SKIN 1 TIME A WEEK 10 mL 0   gabapentin  (NEURONTIN ) 300 MG capsule Take 2 capsules (600 mg total) by mouth at bedtime. 60 capsule 11   glucose blood test strip Use once a day to check blood sugar. 100 each 1   losartan  (COZAAR ) 25 MG tablet Take 1 tablet (25 mg total) by mouth daily. 90 tablet 2   losartan  (COZAAR ) 25 MG tablet Take 1 tablet (25 mg total) by mouth daily. 45 tablet 1   losartan  (COZAAR ) 50 MG tablet Take 1 tablet (50 mg total) by mouth daily. 30 tablet 6   pantoprazole  (PROTONIX ) 40 MG tablet Take 40 mg by mouth daily.     propranolol  (INDERAL ) 60 MG tablet TAKE 1 TABLET EVERY MORNING 90 tablet 3   No facility-administered medications prior to visit.    Allergies  Allergen Reactions   Cymbalta [Duloxetine Hcl] Itching    Nasal itching  Methocarbamol     Propoxyphene N-Acetaminophen Itching   Sulfa Antibiotics     ROS Review of Systems  Constitutional:  Positive for fatigue. Negative for chills and fever.  HENT:  Negative for congestion, sinus pressure and sinus pain.   Eyes:  Negative for pain and discharge.  Respiratory:  Positive for shortness of breath (Intermittent). Negative for cough.   Cardiovascular:  Negative for chest pain and palpitations.  Gastrointestinal:  Positive for constipation. Negative for diarrhea, nausea and vomiting.  Genitourinary:  Negative for frequency and hematuria.  Musculoskeletal:  Positive  for arthralgias, back pain and gait problem. Negative for neck pain and neck stiffness.  Skin:  Negative for rash.  Neurological:  Positive for weakness and headaches (Chronic).  Psychiatric/Behavioral:  Negative for agitation and behavioral problems.       Objective:    Physical Exam Vitals reviewed.  Constitutional:      General: She is not in acute distress.    Appearance: She is obese. She is not diaphoretic.  HENT:     Head: Normocephalic and atraumatic.     Nose: Nose normal.     Mouth/Throat:     Mouth: Mucous membranes are moist.   Eyes:     General: No scleral icterus.    Extraocular Movements: Extraocular movements intact.    Cardiovascular:     Rate and Rhythm: Normal rate and regular rhythm.     Heart sounds: Normal heart sounds. No murmur heard. Pulmonary:     Breath sounds: Normal breath sounds. No wheezing or rales.   Musculoskeletal:     Cervical back: Neck supple. No tenderness.     Right lower leg: No edema.     Left lower leg: No edema.   Skin:    General: Skin is warm.     Findings: No rash.   Neurological:     General: No focal deficit present.     Mental Status: She is alert and oriented to person, place, and time.     Sensory: Sensory deficit (b/l LE) present.     Motor: Weakness (RUE - 4/5, b/l LE - 4/5) present.   Psychiatric:        Mood and Affect: Mood normal.        Behavior: Behavior normal.     BP 138/88 (BP Location: Right Arm)   Pulse 61   Ht 5' 2 (1.575 m)   Wt 169 lb 9.6 oz (76.9 kg)   SpO2 91%   BMI 31.02 kg/m  Wt Readings from Last 3 Encounters:  02/29/24 169 lb 9.6 oz (76.9 kg)  02/20/24 172 lb (78 kg)  01/19/24 172 lb (78 kg)    Lab Results  Component Value Date   TSH 0.634 02/15/2023   Lab Results  Component Value Date   WBC 5.4 07/18/2023   HGB 14.2 07/18/2023   HCT 40.9 07/18/2023   MCV 91 07/18/2023   PLT 208 07/18/2023   Lab Results  Component Value Date   NA 139 02/27/2024   K 3.6 02/27/2024    CO2 22 02/27/2024   GLUCOSE 117 (H) 02/27/2024   BUN 6 (L) 02/27/2024   CREATININE 0.88 02/27/2024   BILITOT 0.4 10/31/2023   ALKPHOS 151 (H) 10/31/2023   AST 21 10/31/2023   ALT 24 10/31/2023   PROT 7.1 10/31/2023   ALBUMIN 4.8 10/31/2023   CALCIUM  9.6 02/27/2024   ANIONGAP 8 03/25/2021   EGFR 75 02/27/2024   Lab Results  Component Value Date  CHOL 171 10/31/2023   Lab Results  Component Value Date   HDL 55 10/31/2023   Lab Results  Component Value Date   LDLCALC 99 10/31/2023   Lab Results  Component Value Date   TRIG 93 10/31/2023   Lab Results  Component Value Date   CHOLHDL 3.1 10/31/2023   Lab Results  Component Value Date   HGBA1C 5.9 (H) 10/31/2023      Assessment & Plan:   Problem List Items Addressed This Visit       Cardiovascular and Mediastinum   Essential hypertension   BP: 138/88   Well-controlled with  Losartan  50 mg QD and Propranolol  80 mg + 60 mg QD now Amlodipine  was recently discontinued due to leg swelling, increased dose of losartan  to 50 mg from 25 mg once daily by Cardiology Counseled for compliance with the medications Advised DASH diet and moderate exercise/walking, at least 150 mins/week      Relevant Orders   CBC with Differential/Platelet   Migraine   On Aimovig  and propranolol  for ppx Followed by neurology - Dr. Gracie Lav        Endocrine   DM type 2 with diabetic peripheral neuropathy (HCC) - Primary   Lab Results  Component Value Date   HGBA1C 5.9 (H) 10/31/2023   Associated with HTN and HLD Well-controlled, glycemic profile improved since starting Ozempic  in 09/24 - agrees to follow low carb diet and small, frequent meals to avoid nausea; Zofran  PRN for nausea Check HbA1c Advised to follow diabetic diet On statin F/u CMP and lipid panel Diabetic eye exam: Advised to follow up with Ophthalmology for diabetic eye exam  On gabapentin  600 mg qHS for neuropathy      Relevant Orders   Hemoglobin A1c    Ambulatory referral to Home Health   Hyperlipidemia associated with type 2 diabetes mellitus (HCC)   On statin Check lipid profile      Relevant Orders   Lipid panel     Nervous and Auditory   Hydrocephalus (HCC)   Has history of Arnold-Chiari malformation VP shunt for hydrocephalus, but had recent increased size of ventricles noted on CT of head - had reprogramming on 10/25/22, and had revision shunt placement in 04/24 as she has recurrent hydrocephalus Followed by neurosurgeon - gait disturbance could be due to hydrocephalus, advised to contact neurosurgeon if persistent gait disturbance      Relevant Orders   Ambulatory referral to Home Health     Other   Encounter for general adult medical examination with abnormal findings   Physical exam as documented. Counseling done  re healthy lifestyle involving commitment to 150 minutes exercise per week, heart healthy diet, and attaining healthy weight.The importance of adequate sleep also discussed. Immunization and cancer screening needs are specifically addressed at this visit.      Gait abnormality   Has complicated medical history, including Chiari malformation and hydrocephalus status post VP shunt placement, polyneuropathy, lumbar DDD and cervical myelopathy in addition to OA of the knee  Refer to home health for PT (strength training) If persistent gait disturbance, will need evaluation by neurosurgeon       Relevant Orders   TSH   CBC with Differential/Platelet   B12   Ambulatory referral to Home Health   Other Visit Diagnoses       Vitamin D  deficiency       Relevant Orders   VITAMIN D  25 Hydroxy (Vit-D Deficiency, Fractures)     B12 deficiency  Relevant Orders   B12     Encounter for immunization       Relevant Orders   Pneumococcal conjugate vaccine 20-valent (Completed)         No orders of the defined types were placed in this encounter.   Follow-up: Return in about 4 months (around  06/30/2024) for DM and HTN.    Meldon Sport, MD

## 2024-02-29 NOTE — Assessment & Plan Note (Addendum)
 BP: 138/88   Well-controlled with  Losartan  50 mg QD and Propranolol  80 mg + 60 mg QD now Amlodipine  was recently discontinued due to leg swelling, increased dose of losartan  to 50 mg from 25 mg once daily by Cardiology Counseled for compliance with the medications Advised DASH diet and moderate exercise/walking, at least 150 mins/week

## 2024-02-29 NOTE — Assessment & Plan Note (Signed)
 Lab Results  Component Value Date   HGBA1C 5.9 (H) 10/31/2023   Associated with HTN and HLD Well-controlled, glycemic profile improved since starting Ozempic  in 09/24 - agrees to follow low carb diet and small, frequent meals to avoid nausea; Zofran  PRN for nausea Check HbA1c Advised to follow diabetic diet On statin F/u CMP and lipid panel Diabetic eye exam: Advised to follow up with Ophthalmology for diabetic eye exam  On gabapentin  600 mg qHS for neuropathy

## 2024-02-29 NOTE — Assessment & Plan Note (Signed)
 Physical exam as documented. Counseling done  re healthy lifestyle involving commitment to 150 minutes exercise per week, heart healthy diet, and attaining healthy weight.The importance of adequate sleep also discussed. Immunization and cancer screening needs are specifically addressed at this visit.

## 2024-02-29 NOTE — Patient Instructions (Signed)
 Please continue to take medications as prescribed.  Please continue to follow low carb diet and ambulate as tolerated.  Please contact Neurosurgeon's office if you have persistent gait imbalance.

## 2024-02-29 NOTE — Assessment & Plan Note (Signed)
 Has complicated medical history, including Chiari malformation and hydrocephalus status post VP shunt placement, polyneuropathy, lumbar DDD and cervical myelopathy in addition to OA of the knee  Refer to home health for PT (strength training) If persistent gait disturbance, will need evaluation by neurosurgeon

## 2024-02-29 NOTE — Assessment & Plan Note (Signed)
On Aimovig and propranolol for ppx Followed by neurology - Dr. Yan 

## 2024-02-29 NOTE — Assessment & Plan Note (Signed)
 On statin Check lipid profile

## 2024-03-01 ENCOUNTER — Other Ambulatory Visit (HOSPITAL_COMMUNITY): Payer: Self-pay

## 2024-03-01 ENCOUNTER — Ambulatory Visit: Payer: Self-pay | Admitting: Internal Medicine

## 2024-03-01 LAB — CBC WITH DIFFERENTIAL/PLATELET
Basophils Absolute: 0 10*3/uL (ref 0.0–0.2)
Basos: 1 %
EOS (ABSOLUTE): 0.1 10*3/uL (ref 0.0–0.4)
Eos: 2 %
Hematocrit: 41.2 % (ref 34.0–46.6)
Hemoglobin: 14.4 g/dL (ref 11.1–15.9)
Immature Grans (Abs): 0 10*3/uL (ref 0.0–0.1)
Immature Granulocytes: 0 %
Lymphocytes Absolute: 2.1 10*3/uL (ref 0.7–3.1)
Lymphs: 30 %
MCH: 32.5 pg (ref 26.6–33.0)
MCHC: 35 g/dL (ref 31.5–35.7)
MCV: 93 fL (ref 79–97)
Monocytes Absolute: 0.7 10*3/uL (ref 0.1–0.9)
Monocytes: 9 %
Neutrophils Absolute: 4.2 10*3/uL (ref 1.4–7.0)
Neutrophils: 58 %
Platelets: 217 10*3/uL (ref 150–450)
RBC: 4.43 x10E6/uL (ref 3.77–5.28)
RDW: 12.5 % (ref 11.7–15.4)
WBC: 7.1 10*3/uL (ref 3.4–10.8)

## 2024-03-01 LAB — HEMOGLOBIN A1C
Est. average glucose Bld gHb Est-mCnc: 114 mg/dL
Hgb A1c MFr Bld: 5.6 % (ref 4.8–5.6)

## 2024-03-01 LAB — VITAMIN B12: Vitamin B-12: 1388 pg/mL — ABNORMAL HIGH (ref 232–1245)

## 2024-03-01 LAB — LIPID PANEL
Chol/HDL Ratio: 2.8 ratio (ref 0.0–4.4)
Cholesterol, Total: 155 mg/dL (ref 100–199)
HDL: 55 mg/dL (ref >39)
LDL Chol Calc (NIH): 80 mg/dL (ref 0–99)
Triglycerides: 112 mg/dL (ref 0–149)
VLDL Cholesterol Cal: 20 mg/dL (ref 5–40)

## 2024-03-01 LAB — VITAMIN D 25 HYDROXY (VIT D DEFICIENCY, FRACTURES): Vit D, 25-Hydroxy: 91.2 ng/mL (ref 30.0–100.0)

## 2024-03-01 LAB — TSH: TSH: 0.598 u[IU]/mL (ref 0.450–4.500)

## 2024-03-08 ENCOUNTER — Other Ambulatory Visit: Payer: Self-pay | Admitting: Cardiology

## 2024-03-08 ENCOUNTER — Other Ambulatory Visit (HOSPITAL_COMMUNITY): Payer: Self-pay

## 2024-03-09 ENCOUNTER — Telehealth: Payer: Self-pay

## 2024-03-09 DIAGNOSIS — I10 Essential (primary) hypertension: Secondary | ICD-10-CM | POA: Diagnosis not present

## 2024-03-09 DIAGNOSIS — E1169 Type 2 diabetes mellitus with other specified complication: Secondary | ICD-10-CM | POA: Diagnosis not present

## 2024-03-09 DIAGNOSIS — Q0702 Arnold-Chiari syndrome with hydrocephalus: Secondary | ICD-10-CM | POA: Diagnosis not present

## 2024-03-09 DIAGNOSIS — J309 Allergic rhinitis, unspecified: Secondary | ICD-10-CM | POA: Diagnosis not present

## 2024-03-09 DIAGNOSIS — G40109 Localization-related (focal) (partial) symptomatic epilepsy and epileptic syndromes with simple partial seizures, not intractable, without status epilepticus: Secondary | ICD-10-CM | POA: Diagnosis not present

## 2024-03-09 DIAGNOSIS — E1142 Type 2 diabetes mellitus with diabetic polyneuropathy: Secondary | ICD-10-CM | POA: Diagnosis not present

## 2024-03-09 DIAGNOSIS — E785 Hyperlipidemia, unspecified: Secondary | ICD-10-CM | POA: Diagnosis not present

## 2024-03-09 DIAGNOSIS — G43909 Migraine, unspecified, not intractable, without status migrainosus: Secondary | ICD-10-CM | POA: Diagnosis not present

## 2024-03-09 NOTE — Telephone Encounter (Signed)
 Copied from CRM [id number]. Topic: Clinical - Home Health Verbal Orders >> Mar 09, 2024  2:28 PM Zebedee SAUNDERS wrote: Caller/Agency: [facility] per Sharlet Cleaves Callback Number: [phone] secure line Service Requested: Medication management and disease management Frequency: 1x 4 weeks, 2 PRN, 2x 1 week Any new concerns about the patient? No

## 2024-03-12 NOTE — Telephone Encounter (Signed)
 Spoke to [place] to approve orders.

## 2024-03-13 ENCOUNTER — Ambulatory Visit: Admitting: Podiatry

## 2024-03-13 DIAGNOSIS — M216X2 Other acquired deformities of left foot: Secondary | ICD-10-CM | POA: Diagnosis not present

## 2024-03-13 DIAGNOSIS — E1142 Type 2 diabetes mellitus with diabetic polyneuropathy: Secondary | ICD-10-CM

## 2024-03-13 DIAGNOSIS — E119 Type 2 diabetes mellitus without complications: Secondary | ICD-10-CM | POA: Diagnosis not present

## 2024-03-13 DIAGNOSIS — M216X1 Other acquired deformities of right foot: Secondary | ICD-10-CM

## 2024-03-13 NOTE — Progress Notes (Signed)
  Subjective:  Patient ID: Amanda Webster, female    DOB: 30-Jan-1961,  MRN: [id number]  Chief Complaint  Patient presents with   Nail Problem    Type 2 diabetes mellitus with diabetic polyneuropathy, without long-term current use of insulin (HCC)     63 y.o. female presents with the above complaint. History confirmed with patient.  Overall she is doing well her A1c is 5.9%.  Would like to have new diabetic shoes made the hightop shoes been very helpful in her balance  Objective:  Physical Exam: warm, good capillary refill, no trophic changes or ulcerative lesions, normal DP and PT pulses, and abnormal sensory exam with loss of protective sensation, she has a pes cavus foot type.  Assessment:   1. Encounter for diabetic foot exam (HCC)   2. Type 2 diabetes mellitus with diabetic polyneuropathy, without long-term current use of insulin (HCC)   3. Acquired bilateral pes cavus      Plan:  Patient was evaluated and treated and all questions answered.  Patient educated on diabetes. Discussed proper diabetic foot care and discussed risks and complications of disease. Educated patient in depth on reasons to return to the office immediately should he/she discover anything concerning or new on the feet. All questions answered. Discussed proper shoes as well.  She is due for new extra-depth diabetic shoes.  Hightop style hiking boots have been helpful for her.  Referral will be sent to [facility].  Follow-up in 1 year for annual diabetic foot examination or sooner if issues    No follow-ups on file.

## 2024-03-19 ENCOUNTER — Other Ambulatory Visit: Payer: Self-pay

## 2024-03-19 ENCOUNTER — Encounter: Payer: Self-pay | Admitting: *Deleted

## 2024-03-19 ENCOUNTER — Other Ambulatory Visit: Payer: Self-pay | Admitting: *Deleted

## 2024-03-19 NOTE — Patient Instructions (Signed)
 Visit Information  Thank you for taking time to visit with me today. Please don't hesitate to contact me if I can be of assistance to you before our next scheduled appointment.  Our next appointment is by telephone on 04-02-2024 at 10:00 am Please call the care guide team at [phone] if you need to cancel or reschedule your appointment.   Following is a copy of your care plan:   Goals Addressed             This Visit's Progress    VBCI RN Care Plan: HTN       Problems:  Chronic Disease Management support and education needs related to HTN  Goal: Over the next 90 days the Patient will attend all scheduled medical appointments: with primary care provider and specialist as evidenced by keeping all scheduled appointments        continue to work with RN Care Manager and/or Social Worker to address care management and care coordination needs related to HTN as evidenced by adherence to care management team scheduled appointments     take all medications exactly as prescribed and will call provider for medication related questions as evidenced by compliance with all medications    verbalize basic understanding of HTN disease process and self health management plan as evidenced by verbal explanation, recognizing/monitoring symptoms, lifestyle modifications  Interventions:   Hypertension Interventions: Last practice recorded BP readings:  BP Readings from Last 3 Encounters:  02/29/24 138/88  02/13/24 127/79  01/19/24 128/70   Most recent eGFR/CrCl:  Lab Results  Component Value Date   EGFR 75 02/27/2024    No components found for: CRCL  Evaluation of current treatment plan related to hypertension self management and patient's adherence to plan as established by provider. Patient reports that she does not regularly check BP because she was told she needed a new BP cuff. Per chart review, new BP order sent to [facility], patient was unaware and will follow up. Patient reporting 10/10  posterior headache and reports this is a chronic issue for her. RNCM advised to monitor BP daily x 2 weeks and record results.  Provided education to patient re: stroke prevention, s/s of heart attack and stroke Reviewed medications with patient and discussed importance of compliance Counseled on adverse effects of illicit drug and excessive alcohol use in patients with high blood pressure  Counseled on the importance of exercise goals with target of 150 minutes per week. Patient states that she is safely able to complete about 20 minutes of walking daily due to her wobbly gait.  Discussed plans with patient for ongoing care management follow up and provided patient with direct contact information for care management team Advised patient, providing education and rationale, to monitor blood pressure daily and record, calling PCP for findings outside established parameters Reviewed scheduled/upcoming provider appointments including: 07-03-2024 with PCP Advised patient to discuss elevated home BP readings with provider Provided education on prescribed diet DASH Discussed complications of poorly controlled blood pressure such as heart disease, stroke, circulatory complications, vision complications, kidney impairment, sexual dysfunction Screening for signs and symptoms of depression related to chronic disease state  Assessed social determinant of health barriers  Patient Self-Care Activities:  Attend all scheduled provider appointments Attend church or other social activities Call pharmacy for medication refills 3-7 days in advance of running out of medications Call provider office for new concerns or questions  Perform all self care activities independently  Perform IADL's (shopping, preparing meals, housekeeping, managing finances) independently  Take medications as prescribed   check blood pressure daily write blood pressure results in a log or diary keep a blood pressure log take blood  pressure log to all doctor appointments call doctor for signs and symptoms of high blood pressure keep all doctor appointments take medications for blood pressure exactly as prescribed begin an exercise program report new symptoms to your doctor eat more whole grains, fruits and vegetables, lean meats and healthy fats  Plan:  Telephone follow up appointment with care management team member scheduled for:  04-02-2024 at 10:00 am             Please call the Suicide and Crisis Lifeline: 988 call the [place]  National Suicide Prevention Lifeline: 912-768-6320 or TTY: (210)810-1478 TTY 252-388-0895) to talk to a trained counselor call 1-800-273-TALK (toll free, 24 hour hotline) call the [facility]: [phone] call 911 if you are experiencing a Mental Health or Behavioral Health Crisis or need someone to talk to.  Patient verbalizes understanding of instructions and care plan provided today and agrees to view in MyChart. Active MyChart status and patient understanding of how to access instructions and care plan via MyChart confirmed with patient.     Rosina Forte, BSN RN [facility], [facility] RN Care Manager Direct Dial: [phone]  Fax: [phone]   DASH Eating Plan DASH stands for Dietary Approaches to Stop Hypertension. The DASH eating plan is a healthy eating plan that has been shown to: Lower high blood pressure (hypertension). Reduce your risk for type 2 diabetes, heart disease, and stroke. Help with weight loss. What are tips for following this plan? Reading food labels Check food labels for the amount of salt (sodium) per serving. Choose foods with less than 5 percent of the Daily Value (DV) of sodium. In general, foods with less than 300 milligrams (mg) of sodium per serving fit into this eating plan. To find whole grains, look for the word whole as the first word in the ingredient list. Shopping Buy  products labeled as low-sodium or no salt added. Buy fresh foods. Avoid canned foods and pre-made or frozen meals. Cooking Try not to add salt when you cook. Use salt-free seasonings or herbs instead of table salt or sea salt. Check with your health care provider or pharmacist before using salt substitutes. Do not fry foods. Cook foods in healthy ways, such as baking, boiling, grilling, roasting, or broiling. Cook using oils that are good for your heart. These include olive, canola, avocado, soybean, and sunflower oil. Meal planning  Eat a balanced diet. This should include: 4 or more servings of fruits and 4 or more servings of vegetables each day. Try to fill half of your plate with fruits and vegetables. 6-8 servings of whole grains each day. 6 or less servings of lean meat, poultry, or fish each day. 1 oz is 1 serving. A 3 oz (85 g) serving of meat is about the same size as the palm of your hand. One egg is 1 oz (28 g). 2-3 servings of low-fat dairy each day. One serving is 1 cup (237 mL). 1 serving of nuts, seeds, or beans 5 times each week. 2-3 servings of heart-healthy fats. Healthy fats called omega-3 fatty acids are found in foods such as walnuts, flaxseeds, fortified milks, and eggs. These fats are also found in cold-water fish, such as sardines, salmon, and mackerel. Limit how much you eat of: Canned or prepackaged foods. Food that is high in trans  fat, such as fried foods. Food that is high in saturated fat, such as fatty meat. Desserts and other sweets, sugary drinks, and other foods with added sugar. Full-fat dairy products. Do not salt foods before eating. Do not eat more than 4 egg yolks a week. Try to eat at least 2 vegetarian meals a week. Eat more home-cooked food and less restaurant, buffet, and fast food. Lifestyle When eating at a restaurant, ask if your food can be made with less salt or no salt. If you drink alcohol: Limit how much you have to: 0-1 drink a day  if you are female. 0-2 drinks a day if you are female. Know how much alcohol is in your drink. In the [place] one drink is one 12 oz bottle of beer (355 mL), one 5 oz glass of wine (148 mL), or one 1 oz glass of hard liquor (44 mL). General information Avoid eating more than 2,300 mg of salt a day. If you have hypertension, you may need to reduce your sodium intake to 1,500 mg a day. Work with your provider to stay at a healthy body weight or lose weight. Ask what the best weight range is for you. On most days of the week, get at least 30 minutes of exercise that causes your heart to beat faster. This may include walking, swimming, or biking. Work with your provider or dietitian to adjust your eating plan to meet your specific calorie needs. What foods should I eat? Fruits All fresh, dried, or frozen fruit. Canned fruits that are in their natural juice and do not have sugar added to them. Vegetables Fresh or frozen vegetables that are raw, steamed, roasted, or grilled. Low-sodium or reduced-sodium tomato and vegetable juice. Low-sodium or reduced-sodium tomato sauce and tomato paste. Low-sodium or reduced-sodium canned vegetables. Grains Whole-grain or whole-wheat bread. Whole-grain or whole-wheat pasta. Brown rice. Mcneil Madeira. Bulgur. Whole-grain and low-sodium cereals. Pita bread. Low-fat, low-sodium crackers. Whole-wheat flour tortillas. Meats and other proteins Skinless chicken or [place]. Ground chicken or [place]. Pork with fat trimmed off. Fish and seafood. Egg whites. Dried beans, peas, or lentils. Unsalted nuts, nut butters, and seeds. Unsalted canned beans. Lean cuts of beef with fat trimmed off. Low-sodium, lean precooked or cured meat, such as sausages or meat loaves. Dairy Low-fat (1%) or fat-free (skim) milk. Reduced-fat, low-fat, or fat-free cheeses. Nonfat, low-sodium ricotta or cottage cheese. Low-fat or nonfat yogurt. Low-fat, low-sodium cheese. Fats and oils Soft margarine  without trans fats. Vegetable oil. Reduced-fat, low-fat, or light mayonnaise and salad dressings (reduced-sodium). Canola, safflower, olive, avocado, soybean, and sunflower oils. Avocado. Seasonings and condiments Herbs. Spices. Seasoning mixes without salt. Other foods Unsalted popcorn and pretzels. Fat-free sweets. The items listed above may not be all the foods and drinks you can have. Talk to a dietitian to learn more. What foods should I avoid? Fruits Canned fruit in a light or heavy syrup. Fried fruit. Fruit in cream or butter sauce. Vegetables Creamed or fried vegetables. Vegetables in a cheese sauce. Regular canned vegetables that are not marked as low-sodium or reduced-sodium. Regular canned tomato sauce and paste that are not marked as low-sodium or reduced-sodium. Regular tomato and vegetable juices that are not marked as low-sodium or reduced-sodium. Dene. Olives. Grains Baked goods made with fat, such as croissants, muffins, or some breads. Dry pasta or rice meal packs. Meats and other proteins Fatty cuts of meat. Ribs. Fried meat. [place]. Bologna, salami, and other precooked or cured meats, such as sausages or  meat loaves, that are not lean and low in sodium. Fat from the back of a pig (fatback). Bratwurst. Salted nuts and seeds. Canned beans with added salt. Canned or smoked fish. Whole eggs or egg yolks. Chicken or [place] with skin. Dairy Whole or 2% milk, cream, and half-and-half. Whole or full-fat cream cheese. Whole-fat or sweetened yogurt. Full-fat cheese. Nondairy creamers. Whipped toppings. Processed cheese and cheese spreads. Fats and oils Butter. Stick margarine. Lard. Shortening. Ghee. Bacon fat. Tropical oils, such as coconut, palm kernel, or palm oil. Seasonings and condiments Onion salt, garlic salt, seasoned salt, table salt, and sea salt. Worcestershire sauce. Tartar sauce. Barbecue sauce. Teriyaki sauce. Soy sauce, including reduced-sodium soy sauce. Steak sauce.  Canned and packaged gravies. Fish sauce. Oyster sauce. Cocktail sauce. Store-bought horseradish. Ketchup. Mustard. Meat flavorings and tenderizers. Bouillon cubes. Hot sauces. Pre-made or packaged marinades. Pre-made or packaged taco seasonings. Relishes. Regular salad dressings. Other foods Salted popcorn and pretzels. The items listed above may not be all the foods and drinks you should avoid. Talk to a dietitian to learn more. Where to find more information National Heart, Lung, and Blood Institute (NHLBI): BuffaloDryCleaner.gl American Heart Association (AHA): heart.org Academy of Nutrition and Dietetics: eatright.org National Kidney Foundation (NKF): kidney.org This information is not intended to replace advice given to you by your health care provider. Make sure you discuss any questions you have with your health care provider. Document Revised: 09/16/2022 Document Reviewed: 09/16/2022 Elsevier Patient Education  2024 [employer].

## 2024-03-19 NOTE — Patient Outreach (Signed)
 Complex Care Management   Visit Note  03/19/2024  Name:  Amanda Webster MRN: [id number] DOB: 06-01-1961  Situation: Referral received for Complex Care Management related to HTN I obtained verbal consent from Patient.  Visit completed with patient  on the phone  Background:   Past Medical History:  Diagnosis Date   Allergy    Anxiety    Arnold-Chiari malformation (HCC)    Arthritis    Borderline diabetic    Chronic headaches    Colon polyps    nonadenomatous   Depression    Diabetes mellitus without complication (HCC)    Diverticulosis    Esophageal stricture    FROZEN LEFT SHOULDER 04/01/2010   Qualifier: Diagnosis of  By: Margrette MD, Taft     GERD (gastroesophageal reflux disease)    HTN (hypertension)    Hyperlipidemia    IBS (irritable bowel syndrome)    Seizures (HCC)    pt not sure when her last seizure was- she says she just spaces out- no shaking but she could not tell me last seizure-  on [place] bid for seizures    Sleep apnea    no cpap now- did use 02 with cpap but has not used either in years    Assessment: Patient Reported Symptoms:  Cognitive Cognitive Status: No symptoms reported Cognitive/Intellectual Conditions Management [RPT]: None reported or documented in medical history or problem list   Health Maintenance Behaviors: Annual physical exam Healing Pattern: Average Health Facilitated by: Rest  Neurological Neurological Review of Symptoms: Headaches Neurological Management Strategies: Routine screening, Medication therapy Neurological Self-Management Outcome: 3 (uncertain)  HEENT HEENT Symptoms Reported: Change or loss of hearing, Tinnitus HEENT Management Strategies: Medication therapy, Routine screening HEENT Self-Management Outcome: 3 (uncertain)    Cardiovascular Cardiovascular Symptoms Reported: No symptoms reported Does patient have uncontrolled Hypertension?: No Cardiovascular Management Strategies: Medication therapy, Routine  screening Cardiovascular Self-Management Outcome: 4 (good)  Respiratory Respiratory Symptoms Reported: No symptoms reported Respiratory Management Strategies: CPAP, Routine screening Respiratory Self-Management Outcome: 4 (good)  Endocrine Endocrine Symptoms Reported: Headaches Is patient diabetic?: Yes Is patient checking blood sugars at home?: Yes List most recent blood sugar readings, include date and time of day: 03-19-24 at 1335 125 Endocrine Self-Management Outcome: 4 (good)  Gastrointestinal Gastrointestinal Symptoms Reported: Abdominal pain or discomfort, Distention Gastrointestinal Management Strategies: Medication therapy Gastrointestinal Self-Management Outcome: 3 (uncertain)    Genitourinary Genitourinary Symptoms Reported: Frequency Genitourinary Self-Management Outcome: 4 (good)  Integumentary Integumentary Symptoms Reported: No symptoms reported Skin Management Strategies: Routine screening Skin Self-Management Outcome: 4 (good)  Musculoskeletal Musculoskelatal Symptoms Reviewed: Limited mobility, Weakness, Unsteady gait Musculoskeletal Management Strategies: Routine screening Musculoskeletal Self-Management Outcome: 4 (good) Falls in the past year?: Yes Number of falls in past year: 2 or more Was there an injury with Fall?: Yes Fall Risk Category Calculator: 3 Patient Fall Risk Level: High Fall Risk Patient at Risk for Falls Due to: History of fall(s), Impaired balance/gait, Impaired mobility Fall risk Follow up: Falls evaluation completed, Education provided, Falls prevention discussed  Psychosocial Psychosocial Symptoms Reported: No symptoms reported Behavioral Management Strategies: Support system Behavioral Health Self-Management Outcome: 4 (good) Major Change/Loss/Stressor/Fears (CP): Denies Techniques to Cope with Loss/Stress/Change: Not applicable Quality of Family Relationships: helpful, involved, supportive Do you feel physically threatened by others?: No       03/19/2024    1:33 PM  Depression screen PHQ 2/9  Decreased Interest 1  Down, Depressed, Hopeless 1  PHQ - 2 Score 2  Altered sleeping 0  Tired, decreased  energy 1  Change in appetite 0  Feeling bad or failure about yourself  0  Trouble concentrating 0  Moving slowly or fidgety/restless 0  Suicidal thoughts 0  PHQ-9 Score 3  Difficult doing work/chores Not difficult at all    There were no vitals filed for this visit.  Medications Reviewed Today     Reviewed by Bertrum Rosina HERO, RN (Registered Nurse) on 03/19/24 at 1328  Med List Status: <None>   Medication Order Taking? Sig Documenting Provider Last Dose Status Informant  ACCU-CHEK GUIDE TEST test strip [id number] Yes USE TO TEST BLOOD GLUCOSE THREE TIMES DAILY Patel, Rutwik K, MD  Active   Accu-Chek Softclix Lancets lancets [id number] Yes 1 each by Other route daily. as directed Tobie Suzzane POUR, MD  Active   AIMOVIG  140 MG/ML EMMANUEL [id number] Yes Inject 140 mg into the skin every 30 (thirty) days. Whitfield Raisin, NP  Active   aspirin EC 81 MG tablet [id number] Yes Take 2 tablets by mouth daily. [provider]  Active   atorvastatin  (LIPITOR) 20 MG tablet [id number] Yes Take 1 tablet (20 mg total) by mouth daily. Tobie Suzzane POUR, MD  Active   Blood Pressure Monitoring (OMRON 3 SERIES BP MONITOR) DEVI [id number]  Use to check blood pressure 1 hour after taking blood pressure medication. Tobie Suzzane POUR, MD  Active   cetirizine  (ZYRTEC ) 10 MG tablet [id number] Yes TAKE 1 TABLET EVERY DAY Patel, Rutwik K, MD  Active   gabapentin  (NEURONTIN ) 300 MG capsule [id number] Yes Take 2 capsules (600 mg total) by mouth at bedtime. Whitfield Raisin, NP  Active   glucose blood test strip [id number] Yes Use to test blood glucose three times daily. Tobie Suzzane POUR, MD  Active   losartan  (COZAAR ) 50 MG tablet [id number] Yes Take 1 tablet (50 mg total) by mouth daily.   Active   Oxcarbazepine  (TRILEPTAL ) 300 MG tablet [id number] Yes Take 1 tablet  (300 mg total) by mouth 2 (two) times daily. Whitfield Raisin, NP  Active   pantoprazole  (PROTONIX ) 40 MG tablet [id number] Yes Take 1 tablet (40 mg total) by mouth daily.   Active   polyethylene glycol powder (GLYCOLAX /MIRALAX ) 17 GM/SCOOP powder [id number]  Take 17 g by mouth 2 (two) times daily as needed.  Patient not taking: Reported on 03/19/2024   Tobie Suzzane POUR, MD  Active   propranolol  (INDERAL ) 60 MG tablet [id number] Yes Take 1 tablet (60 mg total) by mouth in the morning. Miriam Norris, NP  Active   propranolol  (INDERAL ) 80 MG tablet [id number] Yes TAKE 1 TABLET NIGHTLY TO BE TAKEN IN ADDITION TO 60 MG EVERY MORNING. Alvan Dorn FALCON, MD  Active   Semaglutide , 1 MG/DOSE, (OZEMPIC , 1 MG/DOSE,) 4 MG/3ML SOPN [id number] Yes Inject 1 mg into the skin once a week. Tobie Suzzane POUR, MD  Active   sennosides-docusate sodium  (SENOKOT-S) 8.6-50 MG tablet [id number] Yes Take 1 tablet by mouth daily. Tobie Suzzane POUR, MD  Active   UNABLE TO FIND [id number] Yes Aleve  pain relieving lotion-daily [provider]  Active   VITAMIN D  PO [id number] Yes Take 10,000 Units by mouth daily. [provider]  Active             Recommendation:   Continue Current Plan of Care  Follow Up Plan:   Telephone follow-up 2 weeks  Rosina Bertrum, BSN RN [facility], [facility] RN Care Manager Direct Dial: [phone]  Fax: [phone]

## 2024-03-21 ENCOUNTER — Other Ambulatory Visit (HOSPITAL_COMMUNITY): Payer: Self-pay

## 2024-03-21 DIAGNOSIS — J309 Allergic rhinitis, unspecified: Secondary | ICD-10-CM | POA: Diagnosis not present

## 2024-03-21 DIAGNOSIS — E785 Hyperlipidemia, unspecified: Secondary | ICD-10-CM | POA: Diagnosis not present

## 2024-03-21 DIAGNOSIS — G40109 Localization-related (focal) (partial) symptomatic epilepsy and epileptic syndromes with simple partial seizures, not intractable, without status epilepticus: Secondary | ICD-10-CM | POA: Diagnosis not present

## 2024-03-21 DIAGNOSIS — Q0702 Arnold-Chiari syndrome with hydrocephalus: Secondary | ICD-10-CM | POA: Diagnosis not present

## 2024-03-21 DIAGNOSIS — E1142 Type 2 diabetes mellitus with diabetic polyneuropathy: Secondary | ICD-10-CM | POA: Diagnosis not present

## 2024-03-21 DIAGNOSIS — E1169 Type 2 diabetes mellitus with other specified complication: Secondary | ICD-10-CM | POA: Diagnosis not present

## 2024-03-21 DIAGNOSIS — I10 Essential (primary) hypertension: Secondary | ICD-10-CM | POA: Diagnosis not present

## 2024-03-21 DIAGNOSIS — G43909 Migraine, unspecified, not intractable, without status migrainosus: Secondary | ICD-10-CM | POA: Diagnosis not present

## 2024-03-22 ENCOUNTER — Telehealth: Payer: Self-pay

## 2024-03-22 NOTE — Telephone Encounter (Signed)
 Verbal orders provided to stacey

## 2024-03-22 NOTE — Telephone Encounter (Signed)
 Copied from CRM [id number]. Topic: Clinical - Home Health Verbal Orders >> Mar 21, 2024  4:14 PM Rea BROCKS wrote: Caller/Agency: Stacey/ [facility]  Callback Number: [phone] vm is confidential  Service Requested: Physical Therapy Frequency: Once a week for six weeks  Any new concerns about the patient? No

## 2024-03-26 DIAGNOSIS — Z982 Presence of cerebrospinal fluid drainage device: Secondary | ICD-10-CM | POA: Diagnosis not present

## 2024-03-26 DIAGNOSIS — G4733 Obstructive sleep apnea (adult) (pediatric): Secondary | ICD-10-CM | POA: Diagnosis not present

## 2024-03-26 DIAGNOSIS — G918 Other hydrocephalus: Secondary | ICD-10-CM | POA: Diagnosis not present

## 2024-03-27 ENCOUNTER — Other Ambulatory Visit (HOSPITAL_COMMUNITY): Payer: Self-pay

## 2024-03-27 ENCOUNTER — Ambulatory Visit: Payer: Self-pay | Admitting: Neurology

## 2024-03-28 DIAGNOSIS — G40109 Localization-related (focal) (partial) symptomatic epilepsy and epileptic syndromes with simple partial seizures, not intractable, without status epilepticus: Secondary | ICD-10-CM | POA: Diagnosis not present

## 2024-03-28 DIAGNOSIS — Q0702 Arnold-Chiari syndrome with hydrocephalus: Secondary | ICD-10-CM | POA: Diagnosis not present

## 2024-03-28 DIAGNOSIS — E1169 Type 2 diabetes mellitus with other specified complication: Secondary | ICD-10-CM | POA: Diagnosis not present

## 2024-03-28 DIAGNOSIS — E785 Hyperlipidemia, unspecified: Secondary | ICD-10-CM | POA: Diagnosis not present

## 2024-03-28 DIAGNOSIS — I10 Essential (primary) hypertension: Secondary | ICD-10-CM | POA: Diagnosis not present

## 2024-03-28 DIAGNOSIS — J309 Allergic rhinitis, unspecified: Secondary | ICD-10-CM | POA: Diagnosis not present

## 2024-03-28 DIAGNOSIS — G43909 Migraine, unspecified, not intractable, without status migrainosus: Secondary | ICD-10-CM | POA: Diagnosis not present

## 2024-03-28 DIAGNOSIS — E1142 Type 2 diabetes mellitus with diabetic polyneuropathy: Secondary | ICD-10-CM | POA: Diagnosis not present

## 2024-03-28 NOTE — Progress Notes (Unsigned)
 No chief complaint on file.    ASSESSMENT AND PLAN  Amanda Webster is a 63 y.o. female   History of Chiari decompression, placement of VP shunt  Followed by Dr. Nancye neurosurgery  [facility] 12/2023 adequate working shunt             Shunt failure requiring revision on 12/30/2022 with replacement of programmable shunt Revision by [facility] Dr. Nancye on August 20, 2022, due to reported slow worsening headache, unsteady gait, presurgical repeat MRI showed worsening ventricular size     Diabetic neuropathy             EMG nerve conduction study confirmed mild axonal sensorimotor neuropathy, consistent with her diabetic peripheral neuropathy  Restart gabapentin  600 mg nightly  Recent A1c 5.9, down from 7.0    Reported history of seizure,             Spacing spells, worsening over the past several weeks (seems to correlate since running out of gabapentin )  Recommend continuation of Trileptal  but to take 300 mg twice daily (currently taking 600 mg nightly)  Lab work 10/2023 Na 132, down from 140 although no adjustments made to Trileptal  dosage, has f/u visit with PCP in June for repeat labs, advised to continue to monitor sodium level  Restart gabapentin  600 mg nightly  Advised to call if symptoms persist, may need to consider ambulatory EEG to further characterize episodes             EEG was normal in January 2024,    Occasionally headache with migraine features             Reports worsening after discontinuing gabapentin  - about 5-7 per week  Restart gabapentin  600 mg daily  On Aimovig  monthly injection - refill provided  No benefit with botox previously   OSA  Recent eval by Dr. Buck 11/29/23  Prior dx of apnea on CPAP therapy, prior use over 5 years ago  Sleep study 12/2023 mild apnea but significant oxygen desaturation  Currently waiting to be set up with CPAP, initial CPAP compliance visit in July with Dr. Buck     Follow-up in 6 months or call earlier if  needed      DIAGNOSTIC DATA (LABS, IMAGING, TESTING) - I reviewed patient records, labs, notes, testing and imaging myself where available.   MEDICAL HISTORY:  Update 03/29/2024 [name]         Update 12/29/2023 JM:   Returns today for follow-up visit.  Reports she ran out of gabapentin  several weeks ago due to issues obtaining a refill (previously prescribed by Dr. Milton) and has been experiencing increase headache frequency.  She does remain on Aimovig  monthly injections.  She has also been experiencing episodes in the morning shortly after awakening where she feels spaced out and then has extreme fatigue after.  This has been happening more frequently over the past several weeks.  Does check her glucose levels at this time which have been normal.  Reports compliance on oxcarbazepine  but recently realized she has been taking 600 mg nightly instead of 300 mg twice daily.  She feels overall her neuropathy has been stable without progression.  She continues to have intermittent blurry vision and feels like she is drunk walking.  Was seen by neurosurgery and was advised that symptoms are likely due to her shunt, reports PCP concerning possibly cardiac related, has follow-up with Dr. Alvan in May.  She continues to experience  excessive daytime fatigue, has not improved since running out of gabapentin , she was diagnosed with mild sleep apnea but significant oxygen desaturation, awaiting to be set up with CPAP.    Update 06/15/2023 JM:  Reports having a couple seizures since prior visit, typical episode with starring, usually occurs with increased stress which she has been experiencing more of caring for her mother with Alzheimer's dementia.  Reports compliance on Trileptal  300 mg twice daily, denies any specific side effects.  Denies any recent migraine headaches, continues on Aimovig  monthly injection.  Continues to be followed by [facility]  neurosurgery Dr. Nancye who noted shunt failure requiring revision 4/18 with replacement to [employer] programmable shunt set to 4. Settings changed to 2 in May d/t c/o dizziness and headaches.  Recent f/u with neurosurgery 7/11 noted improvement of symptoms after setting change.  Called neurosurgery on 9/16 reporting different type of chronic dizziness, blurred vision, posterior headache, B/B incontinence and recent fall. Per telephone note, NP recommended patient proceed to ED for urgent scan and to check for shunt failure but patient reports never being called back with recommendations. Reports symptoms still persistent, present over the past month. Reports B/B incontinence, blurry vision and posterior headaches intermittent. Feels like she walks like she is drunk. She plans on calling neurosurgery again to follow up.   UPDATE December 15 2022 Dr. Onita: She complains of frequent dizziness, especially when standing up, risk for fall, limited water intake, orthostatic blood pressure was positive today,   Also complains of intermittent headache, nauseous, lightheaded, improved by sleep, related to weather change, have some migraine features, never tried triptans in the past,   We personally reviewed MRI of cervical spine from February 2024, ACDF C5-6, C6-7, no residual cord or foraminal stenosis, mild degenerative changes at C3-4, evidence of suboccipital craniectomy   Follow-up with neurosurgeon Dr. Nancye in May 2024   Consult visit 09/21/2022 Dr. Onita: Amanda Webster is a 63 year old female, seen in request by her primary care doctor Amanda Webster, for evaluation of gait abnormality, history of VP shunt, need to establish neurological care, was previously seen by Dr. Milton   I reviewed and summarized the referring note. PMHX HTN HLD DM for more than 10 years. Anxiety Seizure, space out, last one was in Dec 2023, brought on by stress Obstructive sleep apnea, could not tolerate CPAP. VP  shunt  Patient has a history of posterior decompression surgery for Arnold-Chiari formation in 2000, reported prior to the surgery, she has frequent headaches, weakness, also bilateral hand muscle weakness, reported surgery was helpful, but she has developed complications, spinal fluid leak?',  Eventually had VP shunt placement,  She was able to go back to to work, then had increased difficulties, constellation of complaints, generalized weakness, unsteady gait, around 2010, went on disability  She also had a history of seizure disorder started shortly after her decompression surgery, initially frequent ajovy triggered by stress, now only has small spells intermittently, spacing out, lasting for few minutes, felt fatigued afterwards, had to go to sleep, is taking Trileptal  300 twice a day  She was seen by [facility] neurosurgeon Dr. Since October 2023 for worsening mental postop, increased headache, also mild unsteady gait, leading to revision with Dr. Nestor August 20, 2022, reported some improvement, walk better,  Personally reviewed MRI of the brain September 2023, prior to most revision, chronic CSF shunt with increased lateral and third ventricular/complex abnormality in 2016, no definite transependymal edema, increased nonspecific signal changes in the  left frontal pole,    PHYSICAL EXAM:   There were no vitals filed for this visit.  There is no height or weight on file to calculate BMI.  PHYSICAL EXAMNIATION:  Gen: NAD, very pleasant middle-age Caucasian female, conversant, well nourised, well groomed                     Cardiovascular: Regular rate rhythm, no peripheral edema, warm, nontender. Eyes: Conjunctivae clear without exudates or hemorrhage Neck: Supple, no carotid bruits. Pulmonary: Clear to auscultation bilaterally   NEUROLOGICAL EXAM:  MENTAL STATUS: Speech/cognition: Awake, alert, oriented to history taking and casual conversation CRANIAL NERVES: CN II: Visual  fields are full to confrontation. Pupils are round equal and briskly reactive to light. CN III, IV, VI: extraocular movement are normal. No ptosis. CN V: Facial sensation is intact to light touch CN VII: Face is symmetric with normal eye closure  CN VIII: Hearing is normal to causal conversation. CN IX, X: Phonation is normal. CN XI: Head turning and shoulder shrug are intact  MOTOR: There is no pronator drift of out-stretched arms. Muscle bulk and tone are normal. Muscle strength is normal.  REFLEXES: Reflexes are 2+ and symmetric at the biceps, triceps, knees, and trace ankles. Plantar responses are extensor bilaterally  SENSORY: Length-dependent decreased light touch, pinprick to mid shin level  COORDINATION: There is no trunk or limb dysmetria noted.  GAIT/STANCE: Needs push-up to get up from seated position, unsteady gait greater returns, no use of AD, tandem walk and heel toe not attempted  REVIEW OF SYSTEMS:  Full 14 system review of systems performed and notable only for as above All other review of systems were negative.   ALLERGIES: Allergies  Allergen Reactions   Cymbalta [Duloxetine Hcl] Itching    Nasal itching   Methocarbamol     Propoxyphene N-Acetaminophen Itching   Sulfa Antibiotics     HOME MEDICATIONS: Current Outpatient Medications  Medication Sig Dispense Refill   ACCU-CHEK GUIDE TEST test strip USE TO TEST BLOOD GLUCOSE THREE TIMES DAILY 100 strip 3   Accu-Chek Softclix Lancets lancets 1 each by Other route daily. as directed 100 each 3   AIMOVIG  140 MG/ML SOAJ Inject 140 mg into the skin every 30 (thirty) days. 3 mL 3   aspirin EC 81 MG tablet Take 2 tablets by mouth daily.     atorvastatin  (LIPITOR) 20 MG tablet Take 1 tablet (20 mg total) by mouth daily. 90 tablet 3   Blood Pressure Monitoring (OMRON 3 SERIES BP MONITOR) DEVI Use to check blood pressure 1 hour after taking blood pressure medication. 1 each 0   cetirizine  (ZYRTEC ) 10 MG tablet TAKE  1 TABLET EVERY DAY 90 tablet 0   gabapentin  (NEURONTIN ) 300 MG capsule Take 2 capsules (600 mg total) by mouth at bedtime. 60 capsule 11   glucose blood test strip Use to test blood glucose three times daily. 100 each 2   losartan  (COZAAR ) 50 MG tablet Take 1 tablet (50 mg total) by mouth daily. 30 tablet 6   Oxcarbazepine  (TRILEPTAL ) 300 MG tablet Take 1 tablet (300 mg total) by mouth 2 (two) times daily. 180 tablet 3   pantoprazole  (PROTONIX ) 40 MG tablet Take 1 tablet (40 mg total) by mouth daily. 30 tablet 2   polyethylene glycol powder (GLYCOLAX /MIRALAX ) 17 GM/SCOOP powder Take 17 g by mouth 2 (two) times daily as needed. (Patient not taking: Reported on 03/19/2024) 3350 g 1   propranolol  (INDERAL ) 60 MG tablet  Take 1 tablet (60 mg total) by mouth in the morning. 90 tablet 1   propranolol  (INDERAL ) 80 MG tablet TAKE 1 TABLET NIGHTLY TO BE TAKEN IN ADDITION TO 60 MG EVERY MORNING. 90 tablet 3   Semaglutide , 1 MG/DOSE, (OZEMPIC , 1 MG/DOSE,) 4 MG/3ML SOPN Inject 1 mg into the skin once a week. 3 mL 3   sennosides-docusate sodium  (SENOKOT-S) 8.6-50 MG tablet Take 1 tablet by mouth daily. 30 tablet 2   UNABLE TO FIND Aleve  pain relieving lotion-daily     VITAMIN D  PO Take 10,000 Units by mouth daily.     No current facility-administered medications for this visit.    PAST MEDICAL HISTORY: Past Medical History:  Diagnosis Date   Allergy    Anxiety    Arnold-Chiari malformation (HCC)    Arthritis    Borderline diabetic    Chronic headaches    Colon polyps    nonadenomatous   Depression    Diabetes mellitus without complication (HCC)    Diverticulosis    Esophageal stricture    FROZEN LEFT SHOULDER 04/01/2010   Qualifier: Diagnosis of  By: Margrette MD, Taft     GERD (gastroesophageal reflux disease)    HTN (hypertension)    Hyperlipidemia    IBS (irritable bowel syndrome)    Seizures (HCC)    pt not sure when her last seizure was- she says she just spaces out- no shaking but she  could not tell me last seizure-  on [place] bid for seizures    Sleep apnea    no cpap now- did use 02 with cpap but has not used either in years    PAST SURGICAL HISTORY: Past Surgical History:  Procedure Laterality Date   BREAST BIOPSY Left    cahri decompression  8 and 03/1999   CHOLECYSTECTOMY     COLONOSCOPY     invasive cervical traction  02/2009   left breast-lumpectomy     POLYPECTOMY     shunt put in on right side of brain     SHUNT REPLACEMENT  12/10   TOTAL ABDOMINAL HYSTERECTOMY  Age 84 yrs   TAH & BSO   TUBAL LIGATION     UPPER GASTROINTESTINAL ENDOSCOPY      FAMILY HISTORY: Family History  Problem Relation Age of Onset   Colon polyps Mother    Breast cancer Mother    Lung cancer Mother    Heart disease Father    Stroke Father    Aortic aneurysm Father    Chiari malformation Daughter    Addison's disease Son    Breast cancer Maternal Aunt    Colon cancer Neg Hx    Esophageal cancer Neg Hx    Rectal cancer Neg Hx    Stomach cancer Neg Hx     SOCIAL HISTORY: Social History   Socioeconomic History   Marital status: Married    Spouse name: Not on file   Number of children: 3   Years of education: Not on file   Highest education level: Some college, no degree  Occupational History   Not on file  Tobacco Use   Smoking status: Former    Current packs/day: 0.00    Types: Cigarettes    Quit date: 09/13/1990    Years since quitting: 33.5    Passive exposure: Never   Smokeless tobacco: Never  Vaping Use   Vaping status: Never Used  Substance and Sexual Activity   Alcohol use: No   Drug use: No  Sexual activity: Not Currently    Birth control/protection: Surgical    Comment: hyst  Other Topics Concern   Not on file  Social History Narrative   Disabled from Chiari malformation/seizures.. Drinks 4-5 cups of pepsi daily. Does not get regular exercise. Married for 40 years.   Lives with one pet, and husband.    Social Drivers of Manufacturing engineer Strain: Low Risk  (03/19/2024)   Overall Financial Resource Strain (CARDIA)    Difficulty of Paying Living Expenses: Not hard at all  Recent Concern: Financial Resource Strain - Medium Risk (02/20/2024)   Overall Financial Resource Strain (CARDIA)    Difficulty of Paying Living Expenses: Somewhat hard  Food Insecurity: No Food Insecurity (03/19/2024)   Hunger Vital Sign    Worried About Running Out of Food in the Last Year: Never true    Ran Out of Food in the Last Year: Never true  Transportation Needs: No Transportation Needs (03/19/2024)   PRAPARE - Administrator, Civil Service (Medical): No    Lack of Transportation (Non-Medical): No  Recent Concern: Transportation Needs - Unmet Transportation Needs (02/20/2024)   PRAPARE - Transportation    Lack of Transportation (Medical): No    Lack of Transportation (Non-Medical): Yes  Physical Activity: Insufficiently Active (03/19/2024)   Exercise Vital Sign    Days of Exercise per Week: 7 days    Minutes of Exercise per Session: 20 min  Stress: No Stress Concern Present (03/19/2024)   [employer] of Occupational Health - Occupational Stress Questionnaire    Feeling of Stress: Not at all  Social Connections: Moderately Integrated (03/19/2024)   Social Connection and Isolation Panel    Frequency of Communication with Friends and Family: Three times a week    Frequency of Social Gatherings with Friends and Family: More than three times a week    Attends Religious Services: More than 4 times per year    Active Member of [employer] or Organizations: No    Attends Banker Meetings: Never    Marital Status: Married  Recent Concern: Social Connections - Moderately Isolated (02/20/2024)   Social Connection and Isolation Panel    Frequency of Communication with Friends and Family: More than three times a week    Frequency of Social Gatherings with Friends and Family: Twice a week    Attends Religious Services:  Never    Database administrator or Organizations: No    Attends Banker Meetings: Never    Marital Status: Married  Catering manager Violence: Not At Risk (03/19/2024)   Humiliation, Afraid, Rape, and Kick questionnaire    Fear of Current or Ex-Partner: No    Emotionally Abused: No    Physically Abused: No    Sexually Abused: No      I personally spent a total of *** minutes in the care of the patient today including {Time Based Coding:210964241}.   Harlene Bogaert, AGNP-BC  [facility] [address] [place], [place] [address]  Phone [phone] Fax [phone] Note: This document was prepared with digital dictation and possible smart phrase technology. Any transcriptional errors that result from this process are unintentional.

## 2024-03-29 ENCOUNTER — Encounter: Payer: Self-pay | Admitting: Adult Health

## 2024-03-29 ENCOUNTER — Ambulatory Visit: Admitting: Adult Health

## 2024-03-29 VITALS — BP 155/86 | HR 60 | Ht 62.0 in | Wt 173.0 lb

## 2024-03-29 DIAGNOSIS — G43709 Chronic migraine without aura, not intractable, without status migrainosus: Secondary | ICD-10-CM

## 2024-03-29 DIAGNOSIS — G40909 Epilepsy, unspecified, not intractable, without status epilepticus: Secondary | ICD-10-CM | POA: Diagnosis not present

## 2024-03-29 DIAGNOSIS — G4734 Idiopathic sleep related nonobstructive alveolar hypoventilation: Secondary | ICD-10-CM

## 2024-03-29 DIAGNOSIS — G629 Polyneuropathy, unspecified: Secondary | ICD-10-CM

## 2024-03-29 DIAGNOSIS — G4733 Obstructive sleep apnea (adult) (pediatric): Secondary | ICD-10-CM | POA: Diagnosis not present

## 2024-03-29 DIAGNOSIS — Z982 Presence of cerebrospinal fluid drainage device: Secondary | ICD-10-CM | POA: Diagnosis not present

## 2024-03-29 NOTE — Progress Notes (Signed)
 Emersynn Deatley D, CMA  Joylene Carlean Sprung, Patricia; Ziegler, Melissa; Cain, Leveda Dollar, Dolanda New orders have been placed for the above pt, DOB: 06-21-2061 Thanks

## 2024-03-29 NOTE — Patient Instructions (Addendum)
 Your Plan:  Continue current treatment plan at this time - please call if headaches persist after follow-up visit with neurosurgery for further treatment plan  Continue nightly use of CPAP for adequate sleep apnea management  You will be contacted by [facility] to complete an overnight oximetry test to look for resolution of low oxygen levels while on CPAP therapy    Follow-up in 4 to 6 months with Dr. Onita or call earlier if needed     Thank you for coming to see us  at [facility]. I hope we have been able to provide you high quality care today.  You may receive a patient satisfaction survey over the next few weeks. We would appreciate your feedback and comments so that we may continue to improve ourselves and the health of our patients.

## 2024-04-02 ENCOUNTER — Other Ambulatory Visit: Payer: Self-pay | Admitting: *Deleted

## 2024-04-02 ENCOUNTER — Other Ambulatory Visit: Payer: Self-pay

## 2024-04-02 ENCOUNTER — Other Ambulatory Visit (HOSPITAL_COMMUNITY): Payer: Self-pay

## 2024-04-02 ENCOUNTER — Encounter: Payer: Self-pay | Admitting: Neurology

## 2024-04-02 MED FILL — Semaglutide Soln Pen-inj 1 MG/DOSE (4 MG/3ML): SUBCUTANEOUS | 28 days supply | Qty: 3 | Fill #0 | Status: AC

## 2024-04-02 NOTE — Patient Instructions (Signed)
 Visit Information  Thank you for taking time to visit with me today. Please don't hesitate to contact me if I can be of assistance to you before our next scheduled appointment.  Your next care management appointment is by telephone on 04-16-2024 at 9:30 am  Telephone follow-up in 1 month  Please call the care guide team at [phone] if you need to cancel, schedule, or reschedule an appointment.   Please call the Suicide and Crisis Lifeline: 988 call the [place]  National Suicide Prevention Lifeline: 470 550 2871 or TTY: 416-507-6093 TTY 418-170-2680) to talk to a trained counselor call 1-800-273-TALK (toll free, 24 hour hotline) call the [facility]: 603-827-3563 call 911 if you are experiencing a Mental Health or Behavioral Health Crisis or need someone to talk to.  Rosina Forte, BSN RN [facility], [facility] RN Care Manager Direct Dial: [phone]  Fax: [phone]

## 2024-04-02 NOTE — Patient Outreach (Signed)
 Complex Care Management   Visit Note  04/02/2024  Name:  Amanda Webster MRN: [id number] DOB: 01-10-1961  Situation: Referral received for Complex Care Management related to HTN I obtained verbal consent from Patient.  Visit completed with patient  on the phone  Background:   Past Medical History:  Diagnosis Date   Allergy    Anxiety    Arnold-Chiari malformation (HCC)    Arthritis    Borderline diabetic    Chronic headaches    Colon polyps    nonadenomatous   Depression    Diabetes mellitus without complication (HCC)    Diverticulosis    Esophageal stricture    FROZEN LEFT SHOULDER 04/01/2010   Qualifier: Diagnosis of  By: Margrette MD, Taft     GERD (gastroesophageal reflux disease)    HTN (hypertension)    Hyperlipidemia    IBS (irritable bowel syndrome)    Seizures (HCC)    pt not sure when her last seizure was- she says she just spaces out- no shaking but she could not tell me last seizure-  on Oxtellar bid for seizures    Sleep apnea    no cpap now- did use 02 with cpap but has not used either in years    Assessment: Patient Reported Symptoms:  Cognitive Cognitive Status: No symptoms reported Cognitive/Intellectual Conditions Management [RPT]: None reported or documented in medical history or problem list   Health Maintenance Behaviors: Annual physical exam Healing Pattern: Average Health Facilitated by: Rest  Neurological Neurological Review of Symptoms: No symptoms reported Neurological Management Strategies: Medication therapy, Routine screening Neurological Self-Management Outcome: 4 (good)  HEENT HEENT Symptoms Reported: No symptoms reported HEENT Management Strategies: Routine screening, Medication therapy HEENT Self-Management Outcome: 4 (good)    Cardiovascular Cardiovascular Symptoms Reported: Dizziness Does patient have uncontrolled Hypertension?: No Cardiovascular Management Strategies: Medication therapy, Routine screening Cardiovascular  Self-Management Outcome: 4 (good)  Respiratory Respiratory Symptoms Reported: Dry cough Respiratory Management Strategies: CPAP  Endocrine Endocrine Symptoms Reported: No symptoms reported Is patient diabetic?: Yes Is patient checking blood sugars at home?: Yes List most recent blood sugar readings, include date and time of day: post prandial 149 Endocrine Self-Management Outcome: 4 (good)  Gastrointestinal Gastrointestinal Symptoms Reported: No symptoms reported Gastrointestinal Self-Management Outcome: 4 (good)    Genitourinary Genitourinary Symptoms Reported: Frequency Genitourinary Self-Management Outcome: 4 (good)  Integumentary Integumentary Symptoms Reported: Not assessed    Musculoskeletal Musculoskelatal Symptoms Reviewed: Unsteady gait, Weakness, Limited mobility Musculoskeletal Management Strategies: Routine screening Musculoskeletal Self-Management Outcome: 4 (good) Falls in the past year?: Yes Number of falls in past year: 2 or more Was there an injury with Fall?: Yes Fall Risk Category Calculator: 3 Patient Fall Risk Level: High Fall Risk Patient at Risk for Falls Due to: History of fall(s) Fall risk Follow up: Falls evaluation completed, Education provided, Falls prevention discussed  Psychosocial Psychosocial Symptoms Reported: No symptoms reported Behavioral Health Self-Management Outcome: 4 (good)   Quality of Family Relationships: helpful, involved, supportive Do you feel physically threatened by others?: No      04/02/2024   10:48 AM  Depression screen PHQ 2/9  Decreased Interest 0  Down, Depressed, Hopeless 0  PHQ - 2 Score 0    Vitals:   03/30/24 1046  BP: (!) 150/89    Medications Reviewed Today     Reviewed by Bertrum Rosina HERO, RN (Registered Nurse) on 04/02/24 at 1011  Med List Status: <None>   Medication Order Taking? Sig Documenting Provider Last Dose Status Informant  ACCU-CHEK  GUIDE TEST test strip [id number] Yes USE TO TEST BLOOD GLUCOSE  THREE TIMES DAILY Patel, Rutwik K, MD  Active   Accu-Chek Softclix Lancets lancets [id number] Yes 1 each by Other route daily. as directed Tobie Suzzane POUR, MD  Active   AIMOVIG  140 MG/ML EMMANUEL [id number] Yes Inject 140 mg into the skin every 30 (thirty) days. Whitfield Raisin, NP  Active   aspirin EC 81 MG tablet [id number] Yes Take 2 tablets by mouth daily. [provider]  Active   atorvastatin  (LIPITOR) 20 MG tablet [id number] Yes Take 1 tablet (20 mg total) by mouth daily. Tobie Suzzane POUR, MD  Active   cetirizine  (ZYRTEC ) 10 MG tablet [id number] Yes TAKE 1 TABLET EVERY DAY Tobie Suzzane POUR, MD  Active   gabapentin  (NEURONTIN ) 300 MG capsule [id number] Yes Take 2 capsules (600 mg total) by mouth at bedtime. Whitfield Raisin, NP  Active   glucose blood test strip [id number] Yes Use to test blood glucose three times daily. Tobie Suzzane POUR, MD  Active   losartan  (COZAAR ) 50 MG tablet [id number] Yes Take 1 tablet (50 mg total) by mouth daily.   Active   Oxcarbazepine  (TRILEPTAL ) 300 MG tablet [id number] Yes Take 1 tablet (300 mg total) by mouth 2 (two) times daily. Whitfield Raisin, NP  Active   pantoprazole  (PROTONIX ) 40 MG tablet [id number] Yes Take 1 tablet (40 mg total) by mouth daily.   Active   propranolol  (INDERAL ) 60 MG tablet [id number] Yes Take 1 tablet (60 mg total) by mouth in the morning. Miriam Norris, NP  Active   propranolol  (INDERAL ) 80 MG tablet [id number] Yes TAKE 1 TABLET NIGHTLY TO BE TAKEN IN ADDITION TO 60 MG EVERY MORNING. Alvan Dorn FALCON, MD  Active   Semaglutide , 1 MG/DOSE, (OZEMPIC , 1 MG/DOSE,) 4 MG/3ML SOPN [id number] Yes Inject 1 mg into the skin once a week. Tobie Suzzane POUR, MD  Active   sennosides-docusate sodium  (SENOKOT-S) 8.6-50 MG tablet [id number] Yes Take 1 tablet by mouth daily. Tobie Suzzane POUR, MD  Active   UNABLE TO FIND [id number] Yes Aleve  pain relieving lotion-daily [provider]  Active   VITAMIN D  PO [id number] Yes Take 10,000 Units by mouth daily.  [provider]  Active             Recommendation:   Continue Current Plan of Care  Follow Up Plan:   Telephone follow-up in 1 month  Rosina Forte, BSN RN [facility], [facility] RN Care Manager Direct Dial: [phone]  Fax: [phone]

## 2024-04-03 ENCOUNTER — Ambulatory Visit: Payer: Self-pay | Admitting: Cardiology

## 2024-04-03 ENCOUNTER — Other Ambulatory Visit: Payer: Self-pay

## 2024-04-06 DIAGNOSIS — G40109 Localization-related (focal) (partial) symptomatic epilepsy and epileptic syndromes with simple partial seizures, not intractable, without status epilepticus: Secondary | ICD-10-CM | POA: Diagnosis not present

## 2024-04-06 DIAGNOSIS — Q0702 Arnold-Chiari syndrome with hydrocephalus: Secondary | ICD-10-CM | POA: Diagnosis not present

## 2024-04-06 DIAGNOSIS — J309 Allergic rhinitis, unspecified: Secondary | ICD-10-CM | POA: Diagnosis not present

## 2024-04-06 DIAGNOSIS — G43909 Migraine, unspecified, not intractable, without status migrainosus: Secondary | ICD-10-CM | POA: Diagnosis not present

## 2024-04-06 DIAGNOSIS — E1169 Type 2 diabetes mellitus with other specified complication: Secondary | ICD-10-CM | POA: Diagnosis not present

## 2024-04-06 DIAGNOSIS — E785 Hyperlipidemia, unspecified: Secondary | ICD-10-CM | POA: Diagnosis not present

## 2024-04-06 DIAGNOSIS — I10 Essential (primary) hypertension: Secondary | ICD-10-CM | POA: Diagnosis not present

## 2024-04-06 DIAGNOSIS — E1142 Type 2 diabetes mellitus with diabetic polyneuropathy: Secondary | ICD-10-CM | POA: Diagnosis not present

## 2024-04-08 DIAGNOSIS — E1142 Type 2 diabetes mellitus with diabetic polyneuropathy: Secondary | ICD-10-CM | POA: Diagnosis not present

## 2024-04-08 DIAGNOSIS — G40109 Localization-related (focal) (partial) symptomatic epilepsy and epileptic syndromes with simple partial seizures, not intractable, without status epilepticus: Secondary | ICD-10-CM | POA: Diagnosis not present

## 2024-04-08 DIAGNOSIS — E785 Hyperlipidemia, unspecified: Secondary | ICD-10-CM | POA: Diagnosis not present

## 2024-04-08 DIAGNOSIS — Q0702 Arnold-Chiari syndrome with hydrocephalus: Secondary | ICD-10-CM | POA: Diagnosis not present

## 2024-04-08 DIAGNOSIS — I10 Essential (primary) hypertension: Secondary | ICD-10-CM | POA: Diagnosis not present

## 2024-04-08 DIAGNOSIS — G43909 Migraine, unspecified, not intractable, without status migrainosus: Secondary | ICD-10-CM | POA: Diagnosis not present

## 2024-04-08 DIAGNOSIS — E1169 Type 2 diabetes mellitus with other specified complication: Secondary | ICD-10-CM | POA: Diagnosis not present

## 2024-04-08 DIAGNOSIS — J309 Allergic rhinitis, unspecified: Secondary | ICD-10-CM | POA: Diagnosis not present

## 2024-04-10 DIAGNOSIS — Q0702 Arnold-Chiari syndrome with hydrocephalus: Secondary | ICD-10-CM | POA: Diagnosis not present

## 2024-04-10 DIAGNOSIS — J309 Allergic rhinitis, unspecified: Secondary | ICD-10-CM | POA: Diagnosis not present

## 2024-04-10 DIAGNOSIS — E1142 Type 2 diabetes mellitus with diabetic polyneuropathy: Secondary | ICD-10-CM | POA: Diagnosis not present

## 2024-04-10 DIAGNOSIS — G40109 Localization-related (focal) (partial) symptomatic epilepsy and epileptic syndromes with simple partial seizures, not intractable, without status epilepticus: Secondary | ICD-10-CM | POA: Diagnosis not present

## 2024-04-10 DIAGNOSIS — G43909 Migraine, unspecified, not intractable, without status migrainosus: Secondary | ICD-10-CM | POA: Diagnosis not present

## 2024-04-10 DIAGNOSIS — I10 Essential (primary) hypertension: Secondary | ICD-10-CM | POA: Diagnosis not present

## 2024-04-10 DIAGNOSIS — E785 Hyperlipidemia, unspecified: Secondary | ICD-10-CM | POA: Diagnosis not present

## 2024-04-10 DIAGNOSIS — E1169 Type 2 diabetes mellitus with other specified complication: Secondary | ICD-10-CM | POA: Diagnosis not present

## 2024-04-17 DIAGNOSIS — E785 Hyperlipidemia, unspecified: Secondary | ICD-10-CM | POA: Diagnosis not present

## 2024-04-17 DIAGNOSIS — G40109 Localization-related (focal) (partial) symptomatic epilepsy and epileptic syndromes with simple partial seizures, not intractable, without status epilepticus: Secondary | ICD-10-CM | POA: Diagnosis not present

## 2024-04-17 DIAGNOSIS — Q0702 Arnold-Chiari syndrome with hydrocephalus: Secondary | ICD-10-CM | POA: Diagnosis not present

## 2024-04-17 DIAGNOSIS — E1142 Type 2 diabetes mellitus with diabetic polyneuropathy: Secondary | ICD-10-CM | POA: Diagnosis not present

## 2024-04-17 DIAGNOSIS — J309 Allergic rhinitis, unspecified: Secondary | ICD-10-CM | POA: Diagnosis not present

## 2024-04-17 DIAGNOSIS — G43909 Migraine, unspecified, not intractable, without status migrainosus: Secondary | ICD-10-CM | POA: Diagnosis not present

## 2024-04-17 DIAGNOSIS — I10 Essential (primary) hypertension: Secondary | ICD-10-CM | POA: Diagnosis not present

## 2024-04-17 DIAGNOSIS — E1169 Type 2 diabetes mellitus with other specified complication: Secondary | ICD-10-CM | POA: Diagnosis not present

## 2024-04-24 DIAGNOSIS — G4733 Obstructive sleep apnea (adult) (pediatric): Secondary | ICD-10-CM | POA: Diagnosis not present

## 2024-04-25 ENCOUNTER — Telehealth: Payer: Self-pay

## 2024-04-25 DIAGNOSIS — E785 Hyperlipidemia, unspecified: Secondary | ICD-10-CM | POA: Diagnosis not present

## 2024-04-25 DIAGNOSIS — E1142 Type 2 diabetes mellitus with diabetic polyneuropathy: Secondary | ICD-10-CM | POA: Diagnosis not present

## 2024-04-25 DIAGNOSIS — Q0702 Arnold-Chiari syndrome with hydrocephalus: Secondary | ICD-10-CM | POA: Diagnosis not present

## 2024-04-25 DIAGNOSIS — I10 Essential (primary) hypertension: Secondary | ICD-10-CM | POA: Diagnosis not present

## 2024-04-25 DIAGNOSIS — E1169 Type 2 diabetes mellitus with other specified complication: Secondary | ICD-10-CM | POA: Diagnosis not present

## 2024-04-25 DIAGNOSIS — G40109 Localization-related (focal) (partial) symptomatic epilepsy and epileptic syndromes with simple partial seizures, not intractable, without status epilepticus: Secondary | ICD-10-CM | POA: Diagnosis not present

## 2024-04-25 DIAGNOSIS — J309 Allergic rhinitis, unspecified: Secondary | ICD-10-CM | POA: Diagnosis not present

## 2024-04-25 DIAGNOSIS — G43909 Migraine, unspecified, not intractable, without status migrainosus: Secondary | ICD-10-CM | POA: Diagnosis not present

## 2024-04-25 NOTE — Telephone Encounter (Signed)
 Copied from CRM [id number]. Topic: General - Other >> Apr 25, 2024 10:52 AM Tonda B wrote: Reason for CRM: [facility] is calling saying the patient feel out the bed 2 days no injuries [phone] ask for stacy

## 2024-04-26 DIAGNOSIS — G4733 Obstructive sleep apnea (adult) (pediatric): Secondary | ICD-10-CM | POA: Diagnosis not present

## 2024-04-30 ENCOUNTER — Other Ambulatory Visit: Payer: Self-pay | Admitting: *Deleted

## 2024-04-30 NOTE — Patient Outreach (Signed)
 Complex Care Management   Visit Note  04/30/2024  Name:  Amanda Webster MRN: [id number] DOB: [birth year]  Situation: Referral received for Complex Care Management related to HTN I obtained verbal consent from Patient.  Visit completed with patient  on the phone  Background:   Past Medical History:  Diagnosis Date   Allergy    Anxiety    Arnold-Chiari malformation (HCC)    Arthritis    Borderline diabetic    Chronic headaches    Colon polyps    nonadenomatous   Depression    Diabetes mellitus without complication (HCC)    Diverticulosis    Esophageal stricture    FROZEN LEFT SHOULDER 04/01/2010   Qualifier: Diagnosis of  By: Margrette MD, Taft     GERD (gastroesophageal reflux disease)    HTN (hypertension)    Hyperlipidemia    IBS (irritable bowel syndrome)    Seizures (HCC)    pt not sure when her last seizure was- she says she just spaces out- no shaking but she could not tell me last seizure-  on Oxtellar bid for seizures    Sleep apnea    no cpap now- did use 02 with cpap but has not used either in years    Assessment: Patient Reported Symptoms:  Cognitive Cognitive Status: No symptoms reported Cognitive/Intellectual Conditions Management [RPT]: None reported or documented in medical history or problem list   Health Maintenance Behaviors: Annual physical exam Healing Pattern: Average Health Facilitated by: Rest  Neurological Neurological Review of Symptoms: Dizziness, Headaches, Hearing changes, Numbness, Vision changes, Weakness Neurological Management Strategies: Medication therapy, Coping strategies, Routine screening Neurological Self-Management Outcome: 3 (uncertain) Neurological Comment: lost prescription glasses, using readers at this time. Symptoms related to shunt, has appointment with neurosurgeon next week  HEENT HEENT Symptoms Reported: Mouth dryness HEENT Management Strategies: Coping strategies    Cardiovascular Cardiovascular Symptoms  Reported: No symptoms reported Does patient have uncontrolled Hypertension?: No Cardiovascular Management Strategies: Routine screening Cardiovascular Self-Management Outcome: 4 (good)  Respiratory Respiratory Symptoms Reported: No symptoms reported Additional Respiratory Details: overnight oximetry study upcoming Respiratory Management Strategies: CPAP Respiratory Self-Management Outcome: 4 (good)  Endocrine Endocrine Symptoms Reported: No symptoms reported Is patient diabetic?: Yes Is patient checking blood sugars at home?: Yes List most recent blood sugar readings, include date and time of day: 04-29-2024 107 Endocrine Self-Management Outcome: 4 (good)  Gastrointestinal Gastrointestinal Symptoms Reported: Diarrhea Gastrointestinal Self-Management Outcome: 4 (good)    Genitourinary Genitourinary Symptoms Reported: No symptoms reported    Integumentary Integumentary Symptoms Reported: No symptoms reported Skin Self-Management Outcome: 4 (good)  Musculoskeletal Musculoskelatal Symptoms Reviewed: Limited mobility, Unsteady gait, Weakness Musculoskeletal Management Strategies: Medical device Musculoskeletal Self-Management Outcome: 4 (good) Musculoskeletal Comment: use Number of falls in past year: 2 or more Was there an injury with Fall?: Yes Patient at Risk for Falls Due to: History of fall(s), Impaired balance/gait Fall risk Follow up: Falls evaluation completed, Education provided  Psychosocial Psychosocial Symptoms Reported: No symptoms reported Behavioral Management Strategies: Coping strategies Behavioral Health Self-Management Outcome: 4 (good) Major Change/Loss/Stressor/Fears (CP): Denies Techniques to Cope with Loss/Stress/Change: Not applicable Quality of Family Relationships: helpful, involved, supportive Do you feel physically threatened by others?: No      04/30/2024    9:58 AM  Depression screen PHQ 2/9  Decreased Interest 1  Down, Depressed, Hopeless 0  PHQ - 2  Score 1    There were no vitals filed for this visit.  Medications Reviewed Today     Reviewed by  Bertrum Rosina HERO, RN (Registered Nurse) on 04/30/24 at [phone]  Med List Status: <None>   Medication Order Taking? Sig Documenting Provider Last Dose Status Informant  ACCU-CHEK GUIDE TEST test strip [id number] Yes USE TO TEST BLOOD GLUCOSE THREE TIMES DAILY Patel, Rutwik K, MD  Active   Accu-Chek Softclix Lancets lancets [id number] Yes 1 each by Other route daily. as directed Tobie Suzzane POUR, MD  Active   AIMOVIG  140 MG/ML EMMANUEL [id number] Yes Inject 140 mg into the skin every 30 (thirty) days. Whitfield Raisin, NP  Active   aspirin EC 81 MG tablet [id number] Yes Take 2 tablets by mouth daily. [provider]  Active   atorvastatin  (LIPITOR) 20 MG tablet [id number] Yes Take 1 tablet (20 mg total) by mouth daily. Tobie Suzzane POUR, MD  Active   cetirizine  (ZYRTEC ) 10 MG tablet [id number]  TAKE 1 TABLET EVERY DAY  Patient not taking: Reported on 04/30/2024   Tobie Suzzane POUR, MD  Consider Medication Status and Discontinue   gabapentin  (NEURONTIN ) 300 MG capsule [id number] Yes Take 2 capsules (600 mg total) by mouth at bedtime. Whitfield Raisin, NP  Active   glucose blood test strip [id number] Yes Use to test blood glucose three times daily. Tobie Suzzane POUR, MD  Active   losartan  (COZAAR ) 50 MG tablet [id number] Yes Take 1 tablet (50 mg total) by mouth daily.   Active   Oxcarbazepine  (TRILEPTAL ) 300 MG tablet [id number] Yes Take 1 tablet (300 mg total) by mouth 2 (two) times daily. Whitfield Raisin, NP  Active   pantoprazole  (PROTONIX ) 40 MG tablet [id number] Yes Take 1 tablet (40 mg total) by mouth daily.   Active   propranolol  (INDERAL ) 60 MG tablet [id number] Yes Take 1 tablet (60 mg total) by mouth in the morning. Miriam Norris, NP  Active   propranolol  (INDERAL ) 80 MG tablet [id number] Yes TAKE 1 TABLET NIGHTLY TO BE TAKEN IN ADDITION TO 60 MG EVERY MORNING. Alvan Dorn FALCON, MD  Active   Semaglutide , 1  MG/DOSE, (OZEMPIC , 1 MG/DOSE,) 4 MG/3ML SOPN [id number] Yes Inject 1 mg into the skin once a week. Tobie Suzzane POUR, MD  Active   sennosides-docusate sodium  (SENOKOT-S) 8.6-50 MG tablet [id number] Yes Take 1 tablet by mouth daily. Tobie Suzzane POUR, MD  Active   UNABLE TO FIND [id number] Yes Aleve  pain relieving lotion-daily [provider]  Active   VITAMIN D  PO [id number] Yes Take 10,000 Units by mouth daily. [provider]  Active             Recommendation:   Continue Current Plan of Care  Follow Up Plan:   Telephone follow-up in 1 month  Rosina Bertrum, BSN RN [facility], [facility] RN Care Manager Direct Dial: [phone]  Fax: [phone]

## 2024-04-30 NOTE — Patient Instructions (Signed)
 Visit Information  Thank you for taking time to visit with me today. Please don't hesitate to contact me if I can be of assistance to you before our next scheduled appointment.  Your next care management appointment is by telephone on 05-28-2024 at 10:00 am  Telephone follow-up in 1 month  Please call the care guide team at [phone] if you need to cancel, schedule, or reschedule an appointment.   Please call the Suicide and Crisis Lifeline: 988 call the [place]  National Suicide Prevention Lifeline: (606)693-5384 or TTY: (906)860-5378 TTY 772-317-3949) to talk to a trained counselor call 1-800-273-TALK (toll free, 24 hour hotline) call the [facility]: 703-511-9497 call 911 if you are experiencing a Mental Health or Behavioral Health Crisis or need someone to talk to.  Rosina Forte, BSN RN [facility], [facility] RN Care Manager Direct Dial: [phone]  Fax: [phone]

## 2024-05-02 DIAGNOSIS — R0902 Hypoxemia: Secondary | ICD-10-CM | POA: Diagnosis not present

## 2024-05-02 DIAGNOSIS — G4730 Sleep apnea, unspecified: Secondary | ICD-10-CM | POA: Diagnosis not present

## 2024-05-07 ENCOUNTER — Telehealth: Payer: Self-pay | Admitting: Neurology

## 2024-05-07 DIAGNOSIS — G918 Other hydrocephalus: Secondary | ICD-10-CM | POA: Diagnosis not present

## 2024-05-07 DIAGNOSIS — Z982 Presence of cerebrospinal fluid drainage device: Secondary | ICD-10-CM | POA: Diagnosis not present

## 2024-05-07 NOTE — Telephone Encounter (Signed)
 Received a report for the patient that completed overnight oximetry while wearing CPAP.  The study was completed 8/20-8/21- 8 hrs 14 min.  The lowest the oxygen level dropped was 84% . There was only a recorded 8 min and 37 sec (not at one time but throughout the whole study) where the oxygen was spent below 89%.  Harlene NP is out of the office today. I will provide the information to Dr Buck for her to review and result for the pt.

## 2024-05-07 NOTE — Telephone Encounter (Signed)
 Received patient's ON all results from 05/02/2024 through 05/03/2024 with a total testing time of 8 hours and 14 minutes while on room air and PAP therapy.  Average oxygen saturation was 93%, nadir was 86%, time below or at 88% saturation was 2 minutes and 50 seconds.  Please advise patient that her oxygen saturations are adequate while on PAP therapy, she can maintain treatment on the current settings.  I recommend a follow-up routinely with Harlene in about a year for sleep apnea checkup.

## 2024-05-09 ENCOUNTER — Encounter: Payer: Self-pay | Admitting: Internal Medicine

## 2024-05-10 DIAGNOSIS — G935 Compression of brain: Secondary | ICD-10-CM | POA: Diagnosis not present

## 2024-05-10 DIAGNOSIS — Z982 Presence of cerebrospinal fluid drainage device: Secondary | ICD-10-CM | POA: Diagnosis not present

## 2024-05-10 DIAGNOSIS — K5649 Other impaction of intestine: Secondary | ICD-10-CM | POA: Diagnosis not present

## 2024-05-10 DIAGNOSIS — G918 Other hydrocephalus: Secondary | ICD-10-CM | POA: Diagnosis not present

## 2024-05-10 DIAGNOSIS — I6782 Cerebral ischemia: Secondary | ICD-10-CM | POA: Diagnosis not present

## 2024-05-13 ENCOUNTER — Other Ambulatory Visit: Payer: Self-pay | Admitting: Cardiology

## 2024-05-13 ENCOUNTER — Other Ambulatory Visit (HOSPITAL_COMMUNITY): Payer: Self-pay

## 2024-05-13 ENCOUNTER — Other Ambulatory Visit: Payer: Self-pay | Admitting: Nurse Practitioner

## 2024-05-14 ENCOUNTER — Other Ambulatory Visit (HOSPITAL_COMMUNITY): Payer: Self-pay

## 2024-05-14 ENCOUNTER — Other Ambulatory Visit: Payer: Self-pay

## 2024-05-15 ENCOUNTER — Other Ambulatory Visit: Payer: Self-pay

## 2024-05-15 ENCOUNTER — Other Ambulatory Visit (HOSPITAL_COMMUNITY): Payer: Self-pay

## 2024-05-15 MED ORDER — PROPRANOLOL HCL 60 MG PO TABS
60.0000 mg | ORAL_TABLET | Freq: Every morning | ORAL | 1 refills | Status: AC
  Filled 2024-05-15: qty 90, 90d supply, fill #0
  Filled 2024-07-25 – 2024-09-01 (×2): qty 90, 90d supply, fill #1

## 2024-05-15 MED FILL — Pantoprazole Sodium EC Tab 40 MG (Base Equiv): ORAL | 90 days supply | Qty: 90 | Fill #0 | Status: AC

## 2024-05-16 ENCOUNTER — Other Ambulatory Visit: Payer: Self-pay

## 2024-05-18 ENCOUNTER — Other Ambulatory Visit (HOSPITAL_COMMUNITY): Payer: Self-pay

## 2024-05-27 DIAGNOSIS — G4733 Obstructive sleep apnea (adult) (pediatric): Secondary | ICD-10-CM | POA: Diagnosis not present

## 2024-05-28 ENCOUNTER — Encounter: Payer: Self-pay | Admitting: *Deleted

## 2024-05-28 ENCOUNTER — Telehealth: Payer: Self-pay | Admitting: *Deleted

## 2024-05-28 NOTE — Patient Instructions (Signed)
 Amanda Webster - I am sorry I was unable to reach you today for our scheduled appointment. I work with Tobie, Suzzane POUR, MD and am calling to support your healthcare needs. Please contact me at [phone] at your earliest convenience. I look forward to speaking with you soon.   Thank you,  Rosina Forte, BSN RN [facility], [facility] RN Care Manager Direct Dial: [phone]  Fax: [phone]

## 2024-06-04 ENCOUNTER — Other Ambulatory Visit (HOSPITAL_COMMUNITY): Payer: Self-pay

## 2024-06-05 ENCOUNTER — Other Ambulatory Visit (HOSPITAL_COMMUNITY): Payer: Self-pay

## 2024-06-06 ENCOUNTER — Other Ambulatory Visit (HOSPITAL_COMMUNITY): Payer: Self-pay

## 2024-06-06 ENCOUNTER — Other Ambulatory Visit: Payer: Self-pay | Admitting: Internal Medicine

## 2024-06-06 ENCOUNTER — Other Ambulatory Visit: Payer: Self-pay

## 2024-06-06 DIAGNOSIS — E1142 Type 2 diabetes mellitus with diabetic polyneuropathy: Secondary | ICD-10-CM

## 2024-06-06 MED ORDER — OZEMPIC (1 MG/DOSE) 4 MG/3ML ~~LOC~~ SOPN
1.0000 mg | PEN_INJECTOR | SUBCUTANEOUS | 3 refills | Status: DC
  Filled 2024-06-06: qty 3, 28d supply, fill #0
  Filled 2024-07-01: qty 3, 28d supply, fill #1
  Filled 2024-07-25: qty 3, 28d supply, fill #2
  Filled 2024-08-25 – 2024-08-26 (×2): qty 3, 28d supply, fill #3

## 2024-06-07 ENCOUNTER — Other Ambulatory Visit: Payer: Self-pay | Admitting: Internal Medicine

## 2024-06-07 ENCOUNTER — Other Ambulatory Visit (HOSPITAL_COMMUNITY): Payer: Self-pay

## 2024-06-08 ENCOUNTER — Other Ambulatory Visit (HOSPITAL_COMMUNITY): Payer: Self-pay

## 2024-06-10 ENCOUNTER — Encounter: Payer: Self-pay | Admitting: Internal Medicine

## 2024-06-11 ENCOUNTER — Other Ambulatory Visit (HOSPITAL_COMMUNITY): Payer: Self-pay

## 2024-06-12 ENCOUNTER — Other Ambulatory Visit (HOSPITAL_COMMUNITY): Payer: Self-pay

## 2024-06-13 ENCOUNTER — Telehealth: Payer: Self-pay | Admitting: *Deleted

## 2024-06-13 ENCOUNTER — Encounter: Payer: Self-pay | Admitting: *Deleted

## 2024-06-13 NOTE — Patient Instructions (Signed)
 Amanda Webster - I am sorry I was unable to reach you today for our scheduled appointment. I work with Tobie, Suzzane POUR, MD and am calling to support your healthcare needs. Please contact me at [phone] at your earliest convenience. I look forward to speaking with you soon.   Thank you,  Rosina Forte, BSN RN [facility], [facility] RN Care Manager Direct Dial: [phone]  Fax: [phone]

## 2024-06-15 ENCOUNTER — Other Ambulatory Visit (HOSPITAL_COMMUNITY): Payer: Self-pay

## 2024-06-18 DIAGNOSIS — G918 Other hydrocephalus: Secondary | ICD-10-CM | POA: Diagnosis not present

## 2024-06-18 DIAGNOSIS — Z982 Presence of cerebrospinal fluid drainage device: Secondary | ICD-10-CM | POA: Diagnosis not present

## 2024-06-18 DIAGNOSIS — T85618A Breakdown (mechanical) of other specified internal prosthetic devices, implants and grafts, initial encounter: Secondary | ICD-10-CM | POA: Diagnosis not present

## 2024-06-19 DIAGNOSIS — Q0700 Arnold-Chiari syndrome without spina bifida or hydrocephalus: Secondary | ICD-10-CM | POA: Diagnosis not present

## 2024-06-19 DIAGNOSIS — I10 Essential (primary) hypertension: Secondary | ICD-10-CM | POA: Diagnosis not present

## 2024-06-19 DIAGNOSIS — T8501XA Breakdown (mechanical) of ventricular intracranial (communicating) shunt, initial encounter: Secondary | ICD-10-CM | POA: Diagnosis not present

## 2024-06-19 DIAGNOSIS — R296 Repeated falls: Secondary | ICD-10-CM | POA: Diagnosis not present

## 2024-06-19 DIAGNOSIS — M797 Fibromyalgia: Secondary | ICD-10-CM | POA: Diagnosis not present

## 2024-06-19 DIAGNOSIS — Z87891 Personal history of nicotine dependence: Secondary | ICD-10-CM | POA: Diagnosis not present

## 2024-06-19 DIAGNOSIS — R42 Dizziness and giddiness: Secondary | ICD-10-CM | POA: Diagnosis not present

## 2024-06-19 DIAGNOSIS — Z982 Presence of cerebrospinal fluid drainage device: Secondary | ICD-10-CM | POA: Diagnosis not present

## 2024-06-19 DIAGNOSIS — G910 Communicating hydrocephalus: Secondary | ICD-10-CM | POA: Diagnosis not present

## 2024-06-19 DIAGNOSIS — E119 Type 2 diabetes mellitus without complications: Secondary | ICD-10-CM | POA: Diagnosis not present

## 2024-06-19 DIAGNOSIS — R519 Headache, unspecified: Secondary | ICD-10-CM | POA: Diagnosis not present

## 2024-06-19 DIAGNOSIS — G935 Compression of brain: Secondary | ICD-10-CM | POA: Diagnosis not present

## 2024-06-19 DIAGNOSIS — G40909 Epilepsy, unspecified, not intractable, without status epilepticus: Secondary | ICD-10-CM | POA: Diagnosis not present

## 2024-06-19 DIAGNOSIS — Z7985 Long-term (current) use of injectable non-insulin antidiabetic drugs: Secondary | ICD-10-CM | POA: Diagnosis not present

## 2024-06-20 DIAGNOSIS — T8509XA Other mechanical complication of ventricular intracranial (communicating) shunt, initial encounter: Secondary | ICD-10-CM | POA: Diagnosis not present

## 2024-06-21 ENCOUNTER — Encounter: Payer: Self-pay | Admitting: Adult Health

## 2024-06-21 DIAGNOSIS — Z982 Presence of cerebrospinal fluid drainage device: Secondary | ICD-10-CM | POA: Diagnosis not present

## 2024-06-21 NOTE — Discharge Summary (Signed)
 Neurosurgery Discharge Summary  Patient ID: Amanda Webster [facility] [id number] y.o. 1961/04/12  Admit date: 06/19/2024 Admitting Physician: Garnette Bouquet, MD Admission Diagnoses:   Communicating hydrocephalus recurrent due to ventriculoperitoneal shunt malfunction Arnold-Chiari malformation, type I    (CMD) [G93.5] S/P VP shunt [Z98.2]  Discharge date: 06/21/24  Problem List[1]  Medical History[2]  Discharged Condition: stable  Indications for Admission: [name] has a ventriculoperitoneal shunt malfunction and was admitted for planned surgical intervention.   Hospital Course:  Amanda Webster has a history of ventriculoperitoneal shunt malfunction and was admitted to [name] on 06/19/2024. The patient was taken to the operating room for repositioning ventriculoperitoneal shunt abdominal distal catheter. She tolerated the procedure well and without complications. She recovered in Postanesthesia Care Unit and was transferred to the Neurosurgery floor. She was started on appropriate pain medications and bowel protocol. She continued to progress well and meet surgical milestones. On the day of discharge, she reports adequate pain control with an oral pain medication regimen, is ambulating independently with a walker, voiding, and tolerating house diet without difficulty. She is amenable to the plan for discharge. She was discharged on 06/21/24.   Procedures/Surgeries performed during hospitalization: Repositioning ventriculoperitoneal shunt abdominal distal catheter: The Codman Certus Plus programmable valve we placed previously remains in place and was functioning according to specifications. With Dr. Garnette Adine Bouquet on 06/20/24.   Shunt setting: Certas @1   Discharge Exam: Alert and oriented x4, NAD, cooperative  Speech is clear and appropriate to context PERRL, EOMI Facial symmetry on activation Tongue midline Hearing grossly intact Moves all extremities  well  Moves all extremities 5/5 strength throughout No pronator drift Sensation intact to light touch on face, hands, and feet Abdominal incision c/d/I with sutures  Disposition: home  Patient Instructions:    Discharge Medications     PAUSE taking these medications      Sig Disp Refill Start End  aspirin 81 mg EC tablet Wait to take this until: June 24, 2024  Take 2 tablets (162 mg total) by mouth every morning. Hold until follow-up with neurosurgery.   0         New Medications      Sig Disp Refill Start End  acetaminophen 325 mg tablet Commonly known as: TYLENOL  Take 2 tablets (650 mg total) by mouth every 6 (six) hours as needed for mild pain (1-3), headaches or fever 100.4 F or GREATER (Fever GREATER THAN 100.4 F(38 C)). Do not take within 6hrs of NORCO (Hydrocodone)  Max dose of Acetaminophen daily is 3,000mg . DO NOT Exceed 3,000mg  of acetaminophen daily.   0     HYDROcodone-acetaminophen 5-325 mg per tablet Commonly known as: NORCO  Take 1-2 tablets by mouth every 4 (four) hours as needed (1 for moderate, 2 for severe postop pain).  20 tablet  0         Medications To Continue      Sig Disp Refill Start End  Accu-Chek Aviva Plus test strp test strip Generic drug: glucose blood    0     Accu-Chek Guide Me Glucose Mtr Misc Generic drug: blood-glucose meter    0     Accu-Chek Soft Dev Lancets Kit Generic drug: lancing device with lancets    0     Accu-Chek Softclix Lancets Misc Generic drug: Lancets    0     Aimovig  Autoinjector 140 mg/mL Atin Generic drug: erenumab -aooe  Inject 140 mg under the skin every 30 (thirty) days.  0     atorvastatin  20 mg tablet Commonly known as: LIPITOR  Take 20 mg by mouth nightly.   12     calcium  carbonate 500 mg (200 mg calcium ) chewable tablet Commonly known as: TUMS  Chew 2 tablets as needed in the morning and 2 tablets as needed at noon and 2 tablets as needed in the evening for heartburn.   0      camphor-menthoL 4-10 % Crea  Apply 1 Application topically as needed in the morning and 1 Application as needed at noon and 1 Application as needed in the evening and 1 Application as needed before bedtime.   0     carboxymethylcellulose 0.5 % Dpet Commonly known as: REFRESH PLUS  Administer 1 drop into both eyes as needed in the morning and 1 drop as needed at noon and 1 drop as needed in the evening.   0     cetirizine  10 mg tablet Commonly known as: ZyrTEC   Take 10 mg by mouth every morning.   5     gabapentin  300 mg capsule Commonly known as: NEURONTIN   Take 600 mg by mouth nightly.   0     losartan  50 mg tablet Commonly known as: COZAAR   Take 50 mg by mouth daily.   0     ondansetron  4 mg disintegrating tablet Commonly known as: ZOFRAN -ODT  Take 4 mg by mouth every 8 (eight) hours as needed for nausea.  20 tablet  0     OXcarbazepine  300 mg tablet Commonly known as: TRILEPTAL     0     Ozempic  1 mg/dose (4 mg/3 mL) subcutaneous pen injector Generic drug: semaglutide   Inject 1 mg under the skin every 7 days.   0     pantoprazole  40 mg EC tablet Commonly known as: PROTONIX   Take 40 mg by mouth daily.   0     polyethylene glycol 17 gram Powd powder Commonly known as: MIRALAX   Take 17 g by mouth daily as needed.   0     * propranoloL  60 mg tablet Commonly known as: INDERAL   Take 60 mg by mouth every morning.   0     * propranoloL  80 mg tablet Commonly known as: INDERAL   Take 80 mg by mouth nightly.   0        * * There are duplicate medications prescribed to the patient          Discharge Orders     Activity Instructions:     Details:    Activity Limits:  No biking, ATVs, or contact sports No climbing or ladders No heavy machinery use     Comments: Do not bend, lift, twist, drive, move furniture or perform strenuous activity until cleared at your follow up appointment.   Discharge Instructions - Bathing-Showering     Details:    Tub Bath  or Shower Instructions: Do not soak in tub   Comments: Remove surgical dressing on 06/22/24 then leave open to air. Do not submerge incision.   Discharge instructions - Other (specify)     Comments: Call for fever greater than 101.5, redness, swelling or drainage at your incision site, new or worsening pain, loss of function, new numbness or tingling. Call for  any other question or concern.  The number to call is [phone].   Driving Limits:     Comments: No driving until cleared by your doctor.   Full Code     Lifting Limits:  Details:    Lifting Limits: Do NOT lift more than 10 lbs for:   Lifting Limit Duration: Until cleared by doctor   Other Restrictions:     Comments: It is important to allow your body time to heal after surgery.  Avoid :Lifting anything greater than 10 pounds, repetitive bending, lifting, and twisting. Household chores such as vacuuming, mopping, laundry, and cleaning dishes which can irritate your back/neck ask for help with these tasks or perform them for short periods of time.  Do not put lotions, creams or ointments on the incision.  We encourage you to walk. This will help to keep your joints moving and your body conditioned. Start with walking 10-15 minutes several times per day. Increase your speed and duration of exercise gradually. Make sure to wear comfortable shoes and avoid uneven ground.  Resting is important; however avoid napping too much throughout the day. If you get too much sleep, this may cause trouble sleeping at night. Immobility can also increase your chance of developing blood clots in your legs (DVT's-deep vein thrombosis) It is common to have trouble with pain and muscle spasm during this time period, the above recommendations, along with appropriate medication management, will help you to be more comfortable as you recover.  Refrain from smoking as it may slow bone healing. DO NOT MIX pain medication or muscle relaxants with alcohol. DO NOT  operate any kind of machinery if taking pain medication.   Regular diet     Details:    Diet type: Regular   Wound or Incision Care Instructions     Details:    Wound or Incision Site 1: Abdomen   Wound or Incision Care Instruction Site 1:  Call doctor if incision is red or drains No creams, ointments, or lotions     Comments: Your incision is closed with sutures. You may shower and get the sutures wet 72 hours after surgery but do not submerge/soak your incision into water ie bath tub, swimming pool etc until after your sutures are removed and you have your follow up visit.      Scheduled Future Appointments       Provider Department Dept Phone Center   07/03/2024 9:30 AM Bari LELON Baseman [facility] [place] [address] Neurosurgery [phone] [facility]   07/23/2024 10:00 AM Garnette Bouquet [facility] [place] [address] Neurosurgery [phone] [facility] Comp Can   11/08/2024 12:15 PM [facility] CT[facility] [place] RADIOLOGY CT [facility] [phone] [facility] Med Cent   11/08/2024 1:00 PM Garnette Bouquet [facility] [place] 95 Neurosurgery [phone] [facility] Comp Can      Total time spent on discharge: 35 minutes       [1] Patient Active Problem List Diagnosis  . Cervical spondylosis with myelopathy  . Tinnitus  . Cervicalgia  . Chronic pain syndrome  . Arnold-Chiari malformation, type I (HCC)  . Obstructive sleep apnea (adult) (pediatric)  . Fibromyalgia  . Orthostatic hypotension  . Diabetes mellitus (HCC)  . Other hydrocephalus (HCC)  . Chronic headaches  . Chiari I malformation (HCC)  . S/P cervical spinal fusion  . S/P VP shunt  . Spasm of thoracic back muscle  . Numbness in feet  . Spasms of the hands or feet  . Leg weakness, bilateral  . Right upper quadrant abdominal pain  . Chest pain at rest  . Diarrhea  . Duplay's periarthritis syndrome  . Adiposity  .  Bulging of thoracic intervertebral disc without  myelopathy  . Chronic nonintractable headache  . Hydrocephalus  . Shunt malfunction  . Bilateral carpal tunnel syndrome  . Chronic idiopathic constipation  . DDD (degenerative disc disease), lumbar  . DM type 2 with diabetic peripheral neuropathy    (CMD)  . Essential hypertension  . Gait abnormality  . Gastroesophageal reflux disease  . History of Chiari malformation  . Hyperlipidemia associated with type 2 diabetes mellitus    (CMD)  . Insomnia  . Localization-related epilepsy    (CMD)  . Migraine  . Mixed stress and urge urinary incontinence  . Non-recurrent acute suppurative otitis media of right ear without spontaneous rupture of tympanic membrane  . Physical deconditioning  . Polyarthritis  . Primary osteoarthritis of both hands  . Proteinuria  . Refused influenza vaccine  . Seborrheic keratoses  . Urinary tract infection without hematuria  . Vertigo  [2] Past Medical History: Diagnosis Date  . Anxiety   . Arnold-Chiari malformation, type I    (CMD)   . Benign hypertension 01/12/2012  . Carpal tunnel syndrome   . Diabetes mellitus    Med Hx: Diabetes mellitus;   . Fibromyalgia   . Hard to intubate   . History of home oxygen therapy   . Leg weakness, bilateral 09/12/2015  . Obstructive sleep apnea (adult) (pediatric)   . Orthostatic hypotension   . Seizure    (CMD)   . Spasm of thoracic back muscle 09/12/2015  *Some images could not be shown.

## 2024-06-22 ENCOUNTER — Telehealth: Payer: Self-pay

## 2024-06-22 NOTE — Transitions of Care (Post Inpatient/ED Visit) (Signed)
   06/22/2024  Name: Amanda Webster MRN: [id number] DOB: 1961-09-03  Today's TOC FU Call Status: Today's TOC FU Call Status:: Unsuccessful Call (1st Attempt) Unsuccessful Call (1st Attempt) Date: 06/22/24  Attempted to reach the patient regarding the most recent Inpatient/ED visit. Left a HIPAA approved voicemail message to phone number provided in demographics per DPR.    Follow Up Plan: Additional outreach attempts will be made to reach the patient to complete the Transitions of Care (Post Inpatient/ED visit) call.   Richerd Fish, RN, BSN, CCM [facility], [facility] Health RN Care Manager Direct Dial: [phone]

## 2024-06-25 ENCOUNTER — Other Ambulatory Visit (HOSPITAL_COMMUNITY): Payer: Self-pay

## 2024-06-26 ENCOUNTER — Telehealth: Payer: Self-pay

## 2024-06-26 ENCOUNTER — Other Ambulatory Visit: Payer: Self-pay | Admitting: *Deleted

## 2024-06-26 NOTE — Transitions of Care (Post Inpatient/ED Visit) (Signed)
   06/26/2024  Name: Amanda Webster MRN: [id number] DOB: Dec 14, 1960  Today's TOC FU Call Status: Today's TOC FU Call Status:: Unsuccessful Call (2nd Attempt) Unsuccessful Call (2nd Attempt) Date: 06/26/24  Attempted to reach the patient regarding the most recent Inpatient/ED visit.  Left a HIPAA approved voicemail message to phone number provided in demographics per DPR.    Follow Up Plan: Additional outreach attempts will be made to reach the patient to complete the Transitions of Care (Post Inpatient/ED visit) call.    Richerd Fish, RN, BSN, CCM [facility], [facility] Health RN Care Manager Direct Dial: [phone]

## 2024-06-26 NOTE — Patient Outreach (Signed)
 Complex Care Management   Visit Note  06/26/2024  Name:  Amanda Webster MRN: [id number] DOB: 1960/12/23  Situation: Referral received for Complex Care Management related to HTN I obtained verbal consent from Patient.  Visit completed with Patient  on the phone  Background:   Past Medical History:  Diagnosis Date   Allergy    Anxiety    Arnold-Chiari malformation (HCC)    Arthritis    Borderline diabetic    Chronic headaches    Colon polyps    nonadenomatous   Depression    Diabetes mellitus without complication (HCC)    Diverticulosis    Esophageal stricture    FROZEN LEFT SHOULDER 04/01/2010   Qualifier: Diagnosis of  By: Margrette MD, T[place]     GERD (gastroesophageal reflux disease)    HTN (hypertension)    Hyperlipidemia    IBS (irritable bowel syndrome)    Seizures (HCC)    pt not sure when her last seizure was- she says she just spaces out- no shaking but she could not tell me last seizure-  on [place] bid for seizures    Sleep apnea    no cpap now- did use 02 with cpap but has not used either in years    Assessment: Patient Reported Symptoms:  Cognitive Cognitive Status: No symptoms reported Cognitive/Intellectual Conditions Management [RPT]: None reported or documented in medical history or problem list   Health Maintenance Behaviors: Annual physical exam Healing Pattern: Average Health Facilitated by: Rest, Stress management  Neurological Neurological Review of Symptoms: Headaches Neurological Management Strategies: Routine screening Neurological Self-Management Outcome: 3 (uncertain)  HEENT HEENT Symptoms Reported: Eye dryness HEENT Management Strategies: Routine screening HEENT Self-Management Outcome: 4 (good)    Cardiovascular Cardiovascular Symptoms Reported: Fatigue Does patient have uncontrolled Hypertension?: Yes Is patient checking Blood Pressure at home?: Yes Patient's Recent BP reading at home: 179/92 Cardiovascular Management  Strategies: Routine screening Cardiovascular Self-Management Outcome: 3 (uncertain)  Respiratory Respiratory Symptoms Reported: No symptoms reported Respiratory Management Strategies: Routine screening Respiratory Self-Management Outcome: 4 (good)  Endocrine Is patient diabetic?: Yes Is patient checking blood sugars at home?: Yes List most recent blood sugar readings, include date and time of day: 06-25-2024 105 Endocrine Self-Management Outcome: 4 (good)  Gastrointestinal Gastrointestinal Symptoms Reported: No symptoms reported Gastrointestinal Self-Management Outcome: 4 (good)    Genitourinary Genitourinary Symptoms Reported: No symptoms reported Genitourinary Self-Management Outcome: 4 (good)  Integumentary Integumentary Symptoms Reported: No symptoms reported Skin Management Strategies: Routine screening Skin Self-Management Outcome: 4 (good)  Musculoskeletal Musculoskelatal Symptoms Reviewed: Limited mobility, Unsteady gait, Weakness, Difficulty walking Musculoskeletal Management Strategies: Coping strategies, Medication therapy, Routine screening Musculoskeletal Self-Management Outcome: 4 (good) Falls in the past year?: Yes Number of falls in past year: 2 or more Was there an injury with Fall?: Yes Fall Risk Category Calculator: 3 Patient Fall Risk Level: High Fall Risk Patient at Risk for Falls Due to: History of fall(s), Impaired balance/gait Fall risk Follow up: Falls evaluation completed, Education provided  Psychosocial Psychosocial Symptoms Reported: No symptoms reported Behavioral Management Strategies: Coping strategies Behavioral Health Self-Management Outcome: 4 (good) Major Change/Loss/Stressor/Fears (CP): Denies Techniques to Cope with Loss/Stress/Change: Not applicable Quality of Family Relationships: helpful, involved, supportive Do you feel physically threatened by others?: No    06/26/2024    PHQ2-9 Depression Screening   Little interest or pleasure in  doing things Not at all  Feeling down, depressed, or hopeless Not at all  PHQ-2 - Total Score 0  Trouble falling or staying asleep, or  sleeping too much    Feeling tired or having little energy    Poor appetite or overeating     Feeling bad about yourself - or that you are a failure or have let yourself or your family down    Trouble concentrating on things, such as reading the newspaper or watching television    Moving or speaking so slowly that other people could have noticed.  Or the opposite - being so fidgety or restless that you have been moving around a lot more than usual    Thoughts that you would be better off dead, or hurting yourself in some way    PHQ2-9 Total Score    If you checked off any problems, how difficult have these problems made it for you to do your work, take care of things at home, or get along with other people    Depression Interventions/Treatment      Vitals:   06/26/24 1341 06/26/24 1354  BP: (!) 179/92 (!) 168/96    Medications Reviewed Today     Reviewed by Bertrum Rosina HERO, RN (Registered Nurse) on 06/26/24 at 1348  Med List Status: <None>   Medication Order Taking? Sig Documenting Provider Last Dose Status Informant  ACCU-CHEK GUIDE TEST test strip [id number] Yes USE TO TEST BLOOD GLUCOSE THREE TIMES DAILY Patel, Rutwik K, MD  Active   Accu-Chek Softclix Lancets lancets [id number] Yes 1 each by Other route daily. as directed Tobie Suzzane POUR, MD  Active   AIMOVIG  140 MG/ML EMMANUEL [id number] Yes Inject 140 mg into the skin every 30 (thirty) days. Whitfield Raisin, NP  Active   aspirin EC 81 MG tablet [id number] Yes Take 2 tablets by mouth daily. [provider]  Active   atorvastatin  (LIPITOR) 20 MG tablet [id number] Yes Take 1 tablet (20 mg total) by mouth daily. Tobie Suzzane POUR, MD  Active   cetirizine  (ZYRTEC ) 10 MG tablet [id number]  TAKE 1 TABLET EVERY DAY  Patient not taking: Reported on 06/26/2024   Tobie Suzzane POUR, MD  Consider Medication  Status and Discontinue   gabapentin  (NEURONTIN ) 300 MG capsule [id number] Yes Take 2 capsules (600 mg total) by mouth at bedtime. Whitfield Raisin, NP  Active   glucose blood test strip [id number] Yes Use to test blood glucose three times daily. Tobie Suzzane POUR, MD  Active   losartan  (COZAAR ) 50 MG tablet [id number] Yes Take 1 tablet (50 mg total) by mouth daily.   Active   Oxcarbazepine  (TRILEPTAL ) 300 MG tablet [id number] Yes Take 1 tablet (300 mg total) by mouth 2 (two) times daily. Whitfield Raisin, NP  Active   pantoprazole  (PROTONIX ) 40 MG tablet [id number] Yes Take 1 tablet (40 mg total) by mouth daily. Alvan Dorn FALCON, MD  Active   propranolol  (INDERAL ) 60 MG tablet [id number] Yes Take 1 tablet (60 mg total) by mouth in the morning. Miriam Norris, NP  Active   propranolol  (INDERAL ) 80 MG tablet [id number] Yes TAKE 1 TABLET NIGHTLY TO BE TAKEN IN ADDITION TO 60 MG EVERY MORNING. Alvan Dorn FALCON, MD  Active   Semaglutide , 1 MG/DOSE, (OZEMPIC , 1 MG/DOSE,) 4 MG/3ML SOPN [id number] Yes Inject 1 mg into the skin once a week. Tobie Suzzane POUR, MD  Active   sennosides-docusate sodium  (SENOKOT-S) 8.6-50 MG tablet [id number] Yes Take 1 tablet by mouth daily. Tobie Suzzane POUR, MD  Active   UNABLE TO FIND [id number] Yes Aleve  pain relieving lotion-daily [provider]  Active   VITAMIN D  PO [id number] Yes  Take 10,000 Units by mouth daily. [provider]  Active             Recommendation:   Continue Current Plan of Care  Follow Up Plan:   Telephone follow-up in 1 month  Rosina Forte, BSN RN [facility], [facility] Health RN Care Manager Direct Dial: [phone]  Fax: [phone]

## 2024-06-26 NOTE — Patient Instructions (Signed)
 Visit Information  Thank you for taking time to visit with me today. Please don't hesitate to contact me if I can be of assistance to you before our next scheduled appointment.  Your next care management appointment is by telephone on 07-27-2024 at 10:30 am  Telephone follow-up in 1 month  Please call the care guide team at [phone] if you need to cancel, schedule, or reschedule an appointment.   Please call the Suicide and Crisis Lifeline: 988 call the [place]  National Suicide Prevention Lifeline: (430)201-4547 or TTY: 806-631-6293 TTY 540 284 5201) to talk to a trained counselor call 1-800-273-TALK (toll free, 24 hour hotline) if you are experiencing a Mental Health or Behavioral Health Crisis or need someone to talk to.  Rosina Forte, BSN RN [facility], [facility] Health RN Care Manager Direct Dial: [phone]  Fax: [phone]

## 2024-06-27 ENCOUNTER — Telehealth: Payer: Self-pay

## 2024-06-27 NOTE — Transitions of Care (Post Inpatient/ED Visit) (Signed)
   06/27/2024  Name: Amanda Webster MRN: [id number] DOB: June 01, 1961  Today's TOC FU Call Status: Today's TOC FU Call Status:: Unsuccessful Call (3rd Attempt)  Attempted to reach the patient regarding the most recent Inpatient/ED visit. Left a HIPAA approved voicemail message to phone number provided in demographics per DPR.    Follow Up Plan: No further outreach attempts will be made at this time. We have been unable to contact the patient.  Richerd Fish, RN, BSN, CCM [facility], [facility] Health RN Care Manager Direct Dial: [phone]

## 2024-07-01 ENCOUNTER — Other Ambulatory Visit: Payer: Self-pay | Admitting: Internal Medicine

## 2024-07-01 DIAGNOSIS — E1169 Type 2 diabetes mellitus with other specified complication: Secondary | ICD-10-CM

## 2024-07-02 ENCOUNTER — Other Ambulatory Visit: Payer: Self-pay

## 2024-07-02 ENCOUNTER — Other Ambulatory Visit (HOSPITAL_COMMUNITY): Payer: Self-pay

## 2024-07-02 DIAGNOSIS — I440 Atrioventricular block, first degree: Secondary | ICD-10-CM | POA: Diagnosis not present

## 2024-07-02 DIAGNOSIS — E785 Hyperlipidemia, unspecified: Secondary | ICD-10-CM | POA: Diagnosis not present

## 2024-07-02 DIAGNOSIS — K922 Gastrointestinal hemorrhage, unspecified: Secondary | ICD-10-CM | POA: Diagnosis not present

## 2024-07-02 DIAGNOSIS — I10 Essential (primary) hypertension: Secondary | ICD-10-CM | POA: Diagnosis not present

## 2024-07-02 DIAGNOSIS — G4733 Obstructive sleep apnea (adult) (pediatric): Secondary | ICD-10-CM | POA: Diagnosis not present

## 2024-07-02 DIAGNOSIS — E119 Type 2 diabetes mellitus without complications: Secondary | ICD-10-CM | POA: Diagnosis not present

## 2024-07-02 DIAGNOSIS — T8509XA Other mechanical complication of ventricular intracranial (communicating) shunt, initial encounter: Secondary | ICD-10-CM | POA: Diagnosis not present

## 2024-07-02 DIAGNOSIS — D72829 Elevated white blood cell count, unspecified: Secondary | ICD-10-CM | POA: Diagnosis not present

## 2024-07-02 DIAGNOSIS — Q0700 Arnold-Chiari syndrome without spina bifida or hydrocephalus: Secondary | ICD-10-CM | POA: Diagnosis not present

## 2024-07-02 DIAGNOSIS — Z982 Presence of cerebrospinal fluid drainage device: Secondary | ICD-10-CM | POA: Diagnosis not present

## 2024-07-02 DIAGNOSIS — T85618A Breakdown (mechanical) of other specified internal prosthetic devices, implants and grafts, initial encounter: Secondary | ICD-10-CM | POA: Diagnosis not present

## 2024-07-02 DIAGNOSIS — G919 Hydrocephalus, unspecified: Secondary | ICD-10-CM | POA: Diagnosis not present

## 2024-07-02 DIAGNOSIS — R4182 Altered mental status, unspecified: Secondary | ICD-10-CM | POA: Diagnosis not present

## 2024-07-02 DIAGNOSIS — G40909 Epilepsy, unspecified, not intractable, without status epilepticus: Secondary | ICD-10-CM | POA: Diagnosis not present

## 2024-07-02 DIAGNOSIS — T8501XA Breakdown (mechanical) of ventricular intracranial (communicating) shunt, initial encounter: Secondary | ICD-10-CM | POA: Diagnosis not present

## 2024-07-02 LAB — HEMOGLOBIN A1C: Hemoglobin A1C: 5.7

## 2024-07-02 MED ORDER — ATORVASTATIN CALCIUM 20 MG PO TABS
20.0000 mg | ORAL_TABLET | Freq: Every day | ORAL | 3 refills | Status: AC
  Filled 2024-07-02: qty 90, 90d supply, fill #0
  Filled 2024-07-25 – 2024-09-01 (×3): qty 90, 90d supply, fill #1

## 2024-07-03 ENCOUNTER — Ambulatory Visit: Admitting: Internal Medicine

## 2024-07-04 NOTE — Discharge Summary (Signed)
 Neurosurgery Discharge Summary  Patient ID: Amanda Webster [facility] [id number] 63 y.o. 08-26-1961  Admit date: 07/02/2024 Admitting Physician: Lonni Gwenn Favorite, MD Admission Diagnoses:   Shunt malfunction, initial encounter [T85.618A] Altered mental status, unspecified altered mental status type [R41.82] Hydrocephalus, unspecified type [G91.9]  Discharge date: Wed 07/04/2024  Discharge Physician:  Lonni Gwenn Favorite, MD Discharge Diagnoses:  Shunt malfunction, initial encounter [T85.618A] Altered mental status, unspecified altered mental status type [R41.82] Hydrocephalus, unspecified type [G91.9]  Problem List[1]  Medical History[2]  Discharged Condition: stable  Indications for Admission: Amanda Webster is a 63 year old female with history of Arnold-Chiari malformation, s/p decompression complicated with hydrocephalus s/p VP shunt placement in 2005, s/p revision on 08/20/22 and 01/09/23 (changed to Certas) and on 06/20/24 with Dr.Tatter who presented with concerns of drowsiness with imaging showing interval enlargement in ventricles.    Hospital Course:  She was admitted to NSU. A shunt tap was performed which provided temporary improvement. She was taken to the OR on 07/03/24 for Shunt revision which was well-tolerated without complications. She was transferred to PACU then 5AT. She was started on appropriate pain medication as well as a bowel protocol. She did well overnight. On POD #1, she was much improved. She was ambulatory and tolerating a house diet. She was discharged home on Wed 07/04/2024.  Procedures/Surgeries performed during hospitalization: VP Shunt revision by Dr. Favorite (assisted by EGS) on 07/03/24  Discharge Exam: Awake, alert, and oriented x4 Speech fluent and appropriate PERRL Facial movements symmetric Moves arms and legs well Motor 5/5 x4 extremities No pronator drift Sensation intact to light touch both hands and feet Scalp dressing  C/D/I Abdominal Incisions CDI with Dermabond   Disposition: home    Discharge Medications     PAUSE taking these medications      Sig Disp Refill Start End  aspirin 81 mg EC tablet Wait to take this until your doctor or other care provider tells you to start again. Please follow up at return appointment when to resume  Take 2 tablets (162 mg total) by mouth every morning. Hold until follow-up with neurosurgery.   0         New Medications      Sig Disp Refill Start End  cyclobenzaprine 5 mg tablet Commonly known as: FLEXERIL  Take 1 tablet (5 mg total) by mouth 3 (three) times a day as needed for muscle spasms.  30 tablet  0     HYDROcodone-acetaminophen 5-325 mg per tablet Commonly known as: NORCO  Take 1-2 tablets by mouth every 6 (six) hours as needed (1 tablet for moderate, 2 tablets for severe POST OP pain).  40 tablet  0         Medications To Continue      Sig Disp Refill Start End  Accu-Chek Aviva Plus test strp test strip Generic drug: glucose blood  1 each by Other route 3 (three) times a day.   0     Accu-Chek Guide Me Glucose Mtr Misc Generic drug: blood-glucose meter  1 each by Other route 3 (three) times a day.   0     Accu-Chek Soft Dev Lancets Kit Generic drug: lancing device with lancets  1 each by Other route 3 (three) times a day.   0     Accu-Chek Softclix Lancets Misc Generic drug: Lancets  1 each by Other route 3 (three) times a day.   0     acetaminophen 325 mg tablet Commonly known as:  TYLENOL  Take 2 tablets (650 mg total) by mouth every 6 (six) hours as needed for mild pain (1-3), headaches or fever 100.4 F or GREATER (Fever GREATER THAN 100.4 F(38 C)). Do not take within 6hrs of NORCO (Hydrocodone)  Max dose of Acetaminophen daily is 3,000mg . DO NOT Exceed 3,000mg  of acetaminophen daily.   0     Aimovig  Autoinjector 140 mg/mL Atin Generic drug: erenumab -aooe  Inject 140 mg under the skin every 30 (thirty) days.   0      atorvastatin  20 mg tablet Commonly known as: LIPITOR  Take 20 mg by mouth nightly.   12     calcium  carbonate 500 mg (200 mg calcium ) chewable tablet Commonly known as: TUMS  Chew 2 tablets as needed in the morning and 2 tablets as needed at noon and 2 tablets as needed in the evening for heartburn.   0     camphor-menthoL 4-10 % Crea  Apply 1 Application topically as needed in the morning and 1 Application as needed at noon and 1 Application as needed in the evening and 1 Application as needed before bedtime.   0     carboxymethylcellulose 0.5 % Dpet Commonly known as: REFRESH PLUS  Administer 1 drop into both eyes as needed in the morning and 1 drop as needed at noon and 1 drop as needed in the evening.   0     cetirizine  10 mg tablet Commonly known as: ZyrTEC   Take 10 mg by mouth every morning.   5     gabapentin  300 mg capsule Commonly known as: NEURONTIN   Take 600 mg by mouth nightly.   0     losartan  50 mg tablet Commonly known as: COZAAR   Take 50 mg by mouth daily.   0     ondansetron  4 mg disintegrating tablet Commonly known as: ZOFRAN -ODT  Take 4 mg by mouth every 8 (eight) hours as needed for nausea.  20 tablet  0     OXcarbazepine  300 mg tablet Commonly known as: TRILEPTAL   Take 300 mg by mouth 2 (two) times a day.   0     Ozempic  1 mg/dose (4 mg/3 mL) subcutaneous pen injector Generic drug: semaglutide   Inject 1 mg under the skin every 7 days.   0     pantoprazole  40 mg EC tablet Commonly known as: PROTONIX   Take 40 mg by mouth daily.   0     polyethylene glycol 17 gram Powd powder Commonly known as: MIRALAX   Take 17 g by mouth daily as needed.   0     * propranoloL  60 mg tablet Commonly known as: INDERAL   Take 60 mg by mouth every morning.   0     * propranoloL  80 mg tablet Commonly known as: INDERAL   Take 80 mg by mouth nightly.   0        * * There are duplicate medications prescribed to the patient          Discharge  Orders     Activity Instructions:     Details:    Activity Limits:  No biking, ATVs, or contact sports See Patient Instructions     Patient Instructions: No Strenuous activity   Discharge Instructions - Bathing-Showering     Details:    Tub Bath or Shower Instructions: Do not soak in tub   Comments: Your abdominal surgical incision is closed with glue (dermabond), you may shower the day after surgery,do not scrub. When  finished showering pat incision dry. Do not submerge/soak your incision in water ie bath tub, swimming pool etc until after your follow up visit.   Your cranial incision is closed with sutures. You may shower and get the sutures wet 72 hours after surgery but do not submerge/soak your incision into water ie bath tub, swimming pool etc until after your sutures are removed and you have your follow up visit.   Discharge instructions - Other (specify)     Comments: Call the doctor if you have:  - Problems moving your arm(s), leg(s), or both.  - Problems speaking or understanding what is said to you.  - Severe headaches not relieved with pain medicines.  - Persistent nausea, vomiting, or both. - Temperature greater than 101.5 - Changes in mental status, increased confusion or sleepiness.   Neurosurgery Clinic number: [phone]   Driving Limits:     Comments: No driving while on narcotic pain medication or until released at your follow up visit   Full Code     Lifting Limits:     Details:    Lifting Limits: Do NOT lift more than 10 lbs for:   Lifting Limit Duration: 2 weeks   Other Restrictions:     Comments: Avoid mixing alcohol and narcotic pain medication.   You may need a stool softer while on narcotic pain medication to avoid constipation.   It is normal to feel tired after surgery, slowly increase your activity daily.   Return to previous diet     Details:    Diet type: Return to previous diet   Wound or Incision Care Instructions     Details:    Wound or  Incision Site 1: Head   Wound or Incision Care Instruction Site 1:  Call doctor if incision is red or drains Bruising is normal Clean with soap and water No powders or deodorants No creams, ointments, or lotions     Additional Wound or Incision Site: Wound or Incision Site 2   Wound or Incision Site 2: Abdomen   Wound or Incision Care Instruction Site 2:  Bruising is normal No creams, ointments, or lotions No powders or deodorants     Comments: If you are discharged with surgical dressing in place it can be removed 2 days after your surgery on Thursday 10/23 and left open to air        Appointments which have been scheduled for you    Jul 23, 2024 10:00 AM POST OP with Garnette Bouquet, MD [facility] - [facility] 04 Neurosurgery [facility] [place] [address] [phone]  Please arrive 15 minutes prior to your scheduled appointment.     Nov 08, 2024 12:15 PM CT Head Wo Contrast with [facility] CT1 [facility] - IMAGING RADIOLOGY CT [facility] [place] [address] [phone]  Please bring a list of medications, insurance card and a photo ID with you to the appointment.     Nov 08, 2024 1:00 PM Office Visit with Garnette Bouquet, MD [facility] [place] [address] Neurosurgery ([facility]) [facility] [place] [address] [phone]  Please arrive 15 minutes prior to your scheduled visit.    Additional instructions:   A 10-14 day suture removal has been requested on your behalf. Please call the office if you have not heard from us  within 3 business days for appointment date  and time.   Your Nov. 10th appointment will be moved for 2 weeks after the suture removal.      Total time spent on discharge: 30 minutes       [1] Patient Active Problem List Diagnosis  . Cervical  spondylosis with myelopathy  . Tinnitus  . Cervicalgia  . Chronic pain syndrome  . Arnold-Chiari malformation, type I (HCC)  . Obstructive sleep apnea (adult) (pediatric)  . Fibromyalgia  . Orthostatic hypotension  . Diabetes mellitus (HCC)  . Other hydrocephalus (HCC)  . Chronic headaches  . Chiari I malformation (HCC)  . S/P cervical spinal fusion  . S/P VP shunt  . Spasm of thoracic back muscle  . Numbness in feet  . Spasms of the hands or feet  . Leg weakness, bilateral  . Right upper quadrant abdominal pain  . Chest pain at rest  . Diarrhea  . Duplay's periarthritis syndrome  . Adiposity  . Bulging of thoracic intervertebral disc without myelopathy  . Chronic nonintractable headache  . Hydrocephalus  . Shunt malfunction  . Bilateral carpal tunnel syndrome  . Chronic idiopathic constipation  . DDD (degenerative disc disease), lumbar  . DM type 2 with diabetic peripheral neuropathy    (CMD)  . Essential hypertension  . Gait abnormality  . Gastroesophageal reflux disease  . History of Chiari malformation  . Hyperlipidemia associated with type 2 diabetes mellitus    (CMD)  . Insomnia  . Localization-related epilepsy    (CMD)  . Migraine  . Mixed stress and urge urinary incontinence  . Non-recurrent acute suppurative otitis media of right ear without spontaneous rupture of tympanic membrane  . Physical deconditioning  . Polyarthritis  . Primary osteoarthritis of both hands  . Proteinuria  . Refused influenza vaccine  . Seborrheic keratoses  . Urinary tract infection without hematuria  . Vertigo  . Shunt malfunction, initial encounter  [2] Past Medical History: Diagnosis Date  . Anxiety   . Arnold-Chiari malformation, type I    (CMD)   . Benign hypertension 01/12/2012  . Carpal tunnel syndrome   . Diabetes mellitus    Med Hx: Diabetes mellitus;   . Fibromyalgia   . Hard to intubate   . History of home oxygen therapy   . Leg weakness, bilateral 09/12/2015   . Obstructive sleep apnea (adult) (pediatric)   . Orthostatic hypotension   . Seizure    (CMD)   . Spasm of thoracic back muscle 09/12/2015  *Some images could not be shown.

## 2024-07-05 ENCOUNTER — Telehealth: Payer: Self-pay

## 2024-07-05 ENCOUNTER — Other Ambulatory Visit (HOSPITAL_COMMUNITY): Payer: Self-pay

## 2024-07-05 NOTE — Transitions of Care (Post Inpatient/ED Visit) (Signed)
   07/05/2024  Name: Amanda Webster MRN: [id number] DOB: March 08, 1961  Today's TOC FU Call Status: Today's TOC FU Call Status:: Unsuccessful Call (1st Attempt) Unsuccessful Call (1st Attempt) Date: 07/05/24  Attempted to reach the patient regarding the most recent Inpatient/ED visit.  Follow Up Plan: Additional outreach attempts will be made to reach the patient to complete the Transitions of Care (Post Inpatient/ED visit) call.   Alan Ee, RN, BSN, CEN [employer]- Transition of Care Team.  [facility] [phone]

## 2024-07-06 ENCOUNTER — Telehealth: Payer: Self-pay

## 2024-07-06 NOTE — Transitions of Care (Post Inpatient/ED Visit) (Signed)
   07/06/2024  Name: Amanda Webster MRN: [id number] DOB: 05/20/1961  Today's TOC FU Call Status: Unsuccessful Call (2nd Attempt) Date: 07/06/24  Attempted to reach the patient regarding the most recent Inpatient/ED visit.  Follow Up Plan: Additional outreach attempts will be made to reach the patient to complete the Transitions of Care (Post Inpatient/ED visit) call.   Shona Prow RN, CCM [place]  [facility] RN Care Manager [phone]

## 2024-07-09 ENCOUNTER — Telehealth: Payer: Self-pay

## 2024-07-09 NOTE — Transitions of Care (Post Inpatient/ED Visit) (Signed)
   07/09/2024  Name: Amanda Webster MRN: [id number] DOB: Feb 04, 1961  Today's TOC FU Call Status: Today's TOC FU Call Status:: Unsuccessful Call (3rd Attempt)  Attempted to reach the patient regarding the most recent Inpatient/ED visit. Left a HIPAA approved voicemail message to phone number provided in demographics per DPR.    Follow Up Plan: No further outreach attempts will be made at this time. We have been unable to contact the patient.  Richerd Fish, RN, BSN, CCM [facility], [facility] Management Coordinator Direct Dial: [phone]

## 2024-07-12 ENCOUNTER — Other Ambulatory Visit (HOSPITAL_COMMUNITY): Payer: Self-pay

## 2024-07-14 HISTORY — PX: SHUNT REPLACEMENT: SHX5403

## 2024-07-25 ENCOUNTER — Other Ambulatory Visit (HOSPITAL_COMMUNITY): Payer: Self-pay

## 2024-07-25 ENCOUNTER — Encounter: Payer: Self-pay | Admitting: Cardiology

## 2024-07-25 ENCOUNTER — Encounter: Payer: Self-pay | Admitting: *Deleted

## 2024-07-25 ENCOUNTER — Ambulatory Visit: Attending: Cardiology | Admitting: Cardiology

## 2024-07-25 VITALS — BP 134/78 | HR 68 | Ht 62.0 in | Wt 166.4 lb

## 2024-07-25 DIAGNOSIS — R0789 Other chest pain: Secondary | ICD-10-CM | POA: Diagnosis not present

## 2024-07-25 DIAGNOSIS — E782 Mixed hyperlipidemia: Secondary | ICD-10-CM | POA: Diagnosis not present

## 2024-07-25 DIAGNOSIS — I10 Essential (primary) hypertension: Secondary | ICD-10-CM

## 2024-07-25 DIAGNOSIS — R002 Palpitations: Secondary | ICD-10-CM

## 2024-07-25 MED FILL — Pantoprazole Sodium EC Tab 40 MG (Base Equiv): ORAL | 90 days supply | Qty: 90 | Fill #1 | Status: CN

## 2024-07-25 NOTE — Progress Notes (Signed)
 Clinical Summary Ms. Bera is a 63 y.o.female seen today for follow up of the following medical problems.    1. Chest pain   - normal stress test 08/2016   06/2022: coronary Ca score 3, 69th percentile, mild nonobstructive CAD - 01/2022 echo LVEF 60-65%, no WMA,s normal diastolic function, normal RV function, mild MR - some chest pressures at times, resolves with deep breathing. Occurs 1-2 times month. - some SOB/DOE with activities fairly chronic. Inactive recently with recurrent admissions for shunt malfunction   2.Palpitaitons 10/2020 event monitor: rare ectopy isolated 4 beat run of SVT - compliant with propranolol , which she is also on for migraines.  - denies significant symptoms     3. OSA - has cpap at home       4. HTN - she is compliant with meds   5. Hyperlipidemia -06/2021 TC 169 TG 160 HDL 51 LDL 90 - 02/2023 TC 820 TG 852 HDL 56 LDL 97 - 10/2023 TC 828 TG 93 HDL 55 LDL 99 - 02/2024 TC 844 TG 887 HDL 55 LDL 80   6. LE edema  Reprots recenty bilateral LE edema, mild DOE  01/2022 echo: LVEF 60-65%, no WMAs, normal diastolic function, normal RV. Mild MR.  - some LE edema at times.    7. Arnold-Chiari malformation, s/p cervical fusion, with hydrocephalus  - has VP shunt followed by neurosurg - recent admission 06/2024 with shunt malfunction Past Medical History:  Diagnosis Date   Allergy    Anxiety    Arnold-Chiari malformation (HCC)    Arthritis    Borderline diabetic    Chronic headaches    Colon polyps    nonadenomatous   Depression    Diabetes mellitus without complication (HCC)    Diverticulosis    Esophageal stricture    FROZEN LEFT SHOULDER 04/01/2010   Qualifier: Diagnosis of  By: Margrette MD, T[place]     GERD (gastroesophageal reflux disease)    HTN (hypertension)    Hyperlipidemia    IBS (irritable bowel syndrome)    Seizures (HCC)    pt not sure when her last seizure was- she says she just spaces out- no shaking but she could not  tell me last seizure-  on Oxtellar bid for seizures    Sleep apnea    no cpap now- did use 02 with cpap but has not used either in years     Allergies  Allergen Reactions   Cymbalta [Duloxetine Hcl] Itching    Nasal itching   Methocarbamol     Propoxyphene N-Acetaminophen Itching   Sulfa Antibiotics      Current Outpatient Medications  Medication Sig Dispense Refill   ACCU-CHEK GUIDE TEST test strip USE TO TEST BLOOD GLUCOSE THREE TIMES DAILY 100 strip 3   Accu-Chek Softclix Lancets lancets 1 each by Other route daily. as directed 100 each 3   AIMOVIG  140 MG/ML SOAJ Inject 140 mg into the skin every 30 (thirty) days. 3 mL 3   aspirin EC 81 MG tablet Take 2 tablets by mouth daily.     atorvastatin  (LIPITOR) 20 MG tablet Take 1 tablet (20 mg total) by mouth daily. 90 tablet 3   cetirizine  (ZYRTEC ) 10 MG tablet TAKE 1 TABLET EVERY DAY 90 tablet 0   gabapentin  (NEURONTIN ) 300 MG capsule Take 2 capsules (600 mg total) by mouth at bedtime. 60 capsule 11   glucose blood test strip Use to test blood glucose three times daily. 100 each 2  losartan  (COZAAR ) 50 MG tablet Take 1 tablet (50 mg total) by mouth daily. 30 tablet 6   Oxcarbazepine  (TRILEPTAL ) 300 MG tablet Take 1 tablet (300 mg total) by mouth 2 (two) times daily. 180 tablet 3   pantoprazole  (PROTONIX ) 40 MG tablet Take 1 tablet (40 mg total) by mouth daily. 90 tablet 2   propranolol  (INDERAL ) 60 MG tablet Take 1 tablet (60 mg total) by mouth in the morning. 90 tablet 1   propranolol  (INDERAL ) 80 MG tablet TAKE 1 TABLET NIGHTLY TO BE TAKEN IN ADDITION TO 60 MG EVERY MORNING. 90 tablet 3   Semaglutide , 1 MG/DOSE, (OZEMPIC , 1 MG/DOSE,) 4 MG/3ML SOPN Inject 1 mg into the skin once a week. 3 mL 3   sennosides-docusate sodium  (SENOKOT-S) 8.6-50 MG tablet Take 1 tablet by mouth daily. 30 tablet 2   UNABLE TO FIND Aleve  pain relieving lotion-daily     VITAMIN D  PO Take 10,000 Units by mouth daily.     No current facility-administered  medications for this visit.     Past Surgical History:  Procedure Laterality Date   BREAST BIOPSY Left    cahri decompression  6 and 03/1999   CHOLECYSTECTOMY     COLONOSCOPY     invasive cervical traction  02/2009   left breast-lumpectomy     POLYPECTOMY     shunt put in on right side of brain     SHUNT REPLACEMENT  12/10   TOTAL ABDOMINAL HYSTERECTOMY  Age 84 yrs   TAH & BSO   TUBAL LIGATION     UPPER GASTROINTESTINAL ENDOSCOPY       Allergies  Allergen Reactions   Cymbalta [Duloxetine Hcl] Itching    Nasal itching   Methocarbamol     Propoxyphene N-Acetaminophen Itching   Sulfa Antibiotics       Family History  Problem Relation Age of Onset   Colon polyps Mother    Breast cancer Mother    Lung cancer Mother    Heart disease Father    Stroke Father    Aortic aneurysm Father    Chiari malformation Daughter    Addison's disease Son    Breast cancer Maternal Aunt    Colon cancer Neg Hx    Esophageal cancer Neg Hx    Rectal cancer Neg Hx    Stomach cancer Neg Hx      Social History Ms. Poche reports that she quit smoking about 33 years ago. Her smoking use included cigarettes. She has never been exposed to tobacco smoke. She has never used smokeless tobacco. Ms. Noy reports no history of alcohol use.    Physical Examination Today's Vitals   07/25/24 1358 07/25/24 1432  BP: (!) 144/90 134/78  Pulse: 68   SpO2: 98%   Weight: 166 lb 6.4 oz (75.5 kg)   Height: 5' 2 (1.575 m)    Body mass index is 30.43 kg/m.  Gen: resting comfortably, no acute distress HEENT: no scleral icterus, pupils equal round and reactive, no palptable cervical adenopathy,  CV: RRR, no m/r,g no jvd Resp: Clear to auscultation bilaterally GI: abdomen is soft, non-tender, non-distended, normal bowel sounds, no hepatosplenomegaly MSK: extremities are warm, no edema.  Skin: warm, no rash Neuro:  no focal deficits Psych: appropriate affect   Diagnostic Studies 08/2016  Nuclear stress test No diagnostic ST segment changes to indicate ischemia. No significant myocardial perfusion defects to indicate scar or ischemia. This is a low risk study. Nuclear stress EF: 80%.   10/2020 monitor  7 day monitor No reported patient symptoms or triggered events Rare supraventricular and ventricular ectopy. Isolated 4 beat run of SVT     Patch Wear Time:  7 days and 2 hours (2022-02-02T15:09:51-0500 to 2022-02-09T17:54:08-499)   Patient had a min HR of 48 bpm, max HR of 114 bpm, and avg HR of 69 bpm. Predominant underlying rhythm was Sinus Rhythm. 1 run of Supraventricular Tachycardia occurred lasting 4 beats with a max rate of 114 bpm (avg 111 bpm). Isolated SVEs were rare  (<1.0%), SVE Couplets were rare (<1.0%), and SVE Triplets were rare (<1.0%). Isolated VEs were rare (<1.0%), VE Couplets were rare (<1.0%), and no VE Triplets were present.      01/2022 echo  1. Left ventricular ejection fraction, by estimation, is 60 to 65%. The  left ventricle has normal function. The left ventricle has no regional  wall motion abnormalities. Left ventricular diastolic parameters were  normal.   2. Right ventricular systolic function is normal. The right ventricular  size is normal. There is normal pulmonary artery systolic pressure. The  estimated right ventricular systolic pressure is 26.0 mmHg.   3. The mitral valve is grossly normal. Mild mitral valve regurgitation.   4. The aortic valve is tricuspid. There is mild calcification of the  aortic valve. Aortic valve regurgitation is not visualized. Aortic valve  sclerosis is present, with no evidence of aortic valve stenosis. Aortic  valve mean gradient measures 2.5 mmHg.   5. The inferior vena cava is normal in size with greater than 50%  respiratory variability, suggesting right atrial pressure of 3 mmHg.        06/2022 coronary CTA IMPRESSION: 1. Coronary artery calcium  score 3.36 Agatston units. This places the  patient in the 69th percentile for age and gender, suggesting intermediate risk for future cardiac events.   2.  Mild nonobstructive CAD.    Assessment and Plan   1. Chest pain - recent coronary CTA without significant coronary disease.  - no recent symptoms, continue current meds - some chronic SOB/DOE somewhat progressed, very inactive recently with multiple admissions for her cerebral shunt malfunction. Monitor at this time, do not see strong indication to repeat cardiac testing. Overall benign CTA and echo 2023   2. HTN - at goal, continue current meds - off norvasc  due to LE edema.    3. Palpitations - controlled with propanolol which she is also on for history of migraines - no recent symptoms  4. HLD - cholesterol at goal, continue current meds   F/u 6 months    Dorn PHEBE Ross, M.D

## 2024-07-25 NOTE — Patient Instructions (Signed)

## 2024-07-26 ENCOUNTER — Other Ambulatory Visit: Payer: Self-pay

## 2024-07-26 ENCOUNTER — Telehealth: Payer: Self-pay | Admitting: Neurology

## 2024-07-26 NOTE — Telephone Encounter (Signed)
 [facility] neurosurgeon Dr. Garnette Bouquet evaluation June 20, 2024, revised the malfunctioning distal catheter adjustable ventricular peritoneal shunt because of worsening hydrocephalus associated worsening gait stability urinary incontinence

## 2024-07-27 ENCOUNTER — Telehealth: Payer: Self-pay | Admitting: *Deleted

## 2024-07-27 ENCOUNTER — Encounter: Payer: Self-pay | Admitting: *Deleted

## 2024-07-27 NOTE — Patient Instructions (Signed)
 Amanda Webster - I am sorry I was unable to reach you today for our scheduled appointment. I work with Tobie, Suzzane POUR, MD and am calling to support your healthcare needs. Please contact me at [phone] at your earliest convenience. I look forward to speaking with you soon.   Thank you,  Rosina Forte, BSN RN [facility], [facility] RN Care Manager Direct Dial: [phone]  Fax: [phone]

## 2024-07-28 DIAGNOSIS — G4733 Obstructive sleep apnea (adult) (pediatric): Secondary | ICD-10-CM | POA: Diagnosis not present

## 2024-08-02 ENCOUNTER — Ambulatory Visit: Admitting: Neurology

## 2024-08-04 ENCOUNTER — Other Ambulatory Visit (HOSPITAL_COMMUNITY): Payer: Self-pay

## 2024-08-06 ENCOUNTER — Other Ambulatory Visit (HOSPITAL_COMMUNITY): Payer: Self-pay

## 2024-08-07 ENCOUNTER — Ambulatory Visit: Admitting: Adult Health

## 2024-08-07 ENCOUNTER — Other Ambulatory Visit: Payer: Self-pay

## 2024-08-07 NOTE — Telephone Encounter (Signed)
 She really doesn't need to refrain from bending over. However, I would recommend if she is picking something up to bend with her knees not her back to use better mechanics. I would also highly recommend some physical therapy since she has been through so much this year. I think that this could help with her balance, gait, and strength. They could also help identify if this was coming a vestibular cause and help with that if so. I will place a referral and she can go if she would like to. I have spoken to her about this in the past and she has been hesitant.

## 2024-08-08 ENCOUNTER — Other Ambulatory Visit: Payer: Self-pay

## 2024-08-16 ENCOUNTER — Ambulatory Visit: Admitting: Neurology

## 2024-08-16 ENCOUNTER — Encounter: Payer: Self-pay | Admitting: Neurology

## 2024-08-16 ENCOUNTER — Telehealth: Payer: Self-pay | Admitting: Neurology

## 2024-08-16 VITALS — BP 122/83 | HR 62 | Ht 62.0 in | Wt 163.0 lb

## 2024-08-16 DIAGNOSIS — Z982 Presence of cerebrospinal fluid drainage device: Secondary | ICD-10-CM | POA: Diagnosis not present

## 2024-08-16 DIAGNOSIS — G43709 Chronic migraine without aura, not intractable, without status migrainosus: Secondary | ICD-10-CM | POA: Diagnosis not present

## 2024-08-16 DIAGNOSIS — R269 Unspecified abnormalities of gait and mobility: Secondary | ICD-10-CM

## 2024-08-16 DIAGNOSIS — G40909 Epilepsy, unspecified, not intractable, without status epilepticus: Secondary | ICD-10-CM | POA: Insufficient documentation

## 2024-08-16 MED ORDER — OXCARBAZEPINE 300 MG PO TABS
300.0000 mg | ORAL_TABLET | Freq: Two times a day (BID) | ORAL | 3 refills | Status: AC
  Filled 2024-08-16 – 2024-10-01 (×2): qty 180, 90d supply, fill #0

## 2024-08-16 MED ORDER — AIMOVIG 140 MG/ML ~~LOC~~ SOAJ
140.0000 mg | SUBCUTANEOUS | 3 refills | Status: AC
  Filled 2024-08-16 – 2024-09-01 (×2): qty 3, 90d supply, fill #0

## 2024-08-16 MED ORDER — OXCARBAZEPINE 300 MG PO TABS: 300.0000 mg | ORAL_TABLET | Freq: Two times a day (BID) | ORAL | 3 refills | Status: DC

## 2024-08-16 MED ORDER — RIZATRIPTAN BENZOATE 5 MG PO TBDP
5.0000 mg | ORAL_TABLET | ORAL | 6 refills | Status: AC | PRN
  Filled 2024-08-16: qty 10, 30d supply, fill #0

## 2024-08-16 MED ORDER — GABAPENTIN 300 MG PO CAPS
600.0000 mg | ORAL_CAPSULE | Freq: Every day | ORAL | 3 refills | Status: AC
  Filled 2024-08-16 – 2024-09-04 (×3): qty 180, 90d supply, fill #0

## 2024-08-16 NOTE — Progress Notes (Addendum)
 "  Chief Complaint  Patient presents with   Follow-up    Pt in EMG room 4. Alone. Here for migraine follow up.      ASSESSMENT AND PLAN  Amanda Webster is a 63 y.o. female   History of Chiari decompression, placement of VP shunt  Revision by [facility] Dr. Nancye on August 20, 2022, due to reported slow worsening headache, unsteady gait, revised again in June 20, 2024, malfunctioning distal catheter adjustable ventricular peritoneal shunt with worsening hydrocephalus associated worsening gait stability urinary incontinence  Her gait seems to improve after revision   MRI of the cervical in Feb 2024 showed ACDF C5-6, C6-7, no evidence of cord compression  EMG nerve conduction study confirmed mild axonal sensorimotor neuropathy, consistent with her diabetic peripheral neuropathy,    Reported history of seizure,  Spacing spells,  On Trileptal  300mg  bid  EEG was normal in January 2024,  Occasionally headache with migraine features  Aimovig  140mg  monthly as prevention  Maxalt  prn   RTC in 6 months DIAGNOSTIC DATA (LABS, IMAGING, TESTING) - I reviewed patient records, labs, notes, testing and imaging myself where available.  Addendum: Neurosurgeon evaluation from [facility] Dr. Garnette Nancye September 05, 2024, leaving [facility] recent dual episode of distal shunt insufficiency, the shunt was revised into the right pleural space of the thoracic cavity, programmed at a setting of 5 which could potentially be released if she develops positional headache or subdural fluid collection on lowered if her symptoms of somnolence, headache, gait instability, urinary incontinence does not improve.  The device Codman Certas Plus adjustable ventriculao-closure shunt is MRI compatible,   MEDICAL HISTORY:  Amanda Webster is a 63 year old female, seen in request by her primary care doctor Tobie Suzzane POUR, for evaluation of gait abnormality, history of VP shunt, need to establish neurological care,  was previously seen by Dr. Milton   I reviewed and summarized the referring note. PMHX HTN HLD DM for more than 10 years. Anxiety Seizure, space out, last one was in Dec 2023, brought on by stress Obstructive sleep apnea, could not tolerate CPAP. VP shunt  Patient has a history of posterior decompression surgery for Arnold-Chiari formation in 2000, reported prior to the surgery, she has frequent headaches, weakness, also bilateral hand muscle weakness, reported surgery was helpful, but she has developed complications, spinal fluid leak?',  Eventually had VP shunt placement,  She was able to go back to to work, then had increased difficulties, constellation of complaints, generalized weakness, unsteady gait, around 2010, went on disability  She also had a history of seizure disorder started shortly after her decompression surgery, initially frequent ajovy triggered by stress, now only has small spells intermittently, spacing out, lasting for few minutes, felt fatigued afterwards, had to go to sleep, is taking Trileptal  300 twice a day  She was seen by [facility] neurosurgeon Dr. Since October 2023 for worsening mental postop, increased headache, also mild unsteady gait, leading to revision with Dr. Nancye August 20, 2022, reported some improvement, walk better,  Personally reviewed MRI of the brain September 2023, prior to most revision, chronic CSF shunt with increased lateral and third ventricular/complex abnormality in 2016, no definite transependymal edema, increased nonspecific signal changes in the left frontal pole,  UPDATE December 15 2022: She complains of frequent dizziness, especially when standing up, risk for fall, limited water intake, orthostatic blood pressure was positive today,  Also complains of intermittent headache, nauseous, lightheaded, improved by sleep, related to weather change, have some migraine  features, never tried triptans in the past,  We personally reviewed MRI  of cervical spine from February 2024, ACDF C5-6, C6-7, no residual cord or foraminal stenosis, mild degenerative changes at C3-4, evidence of suboccipital craniectomy  Follow-up with neurosurgeon Dr. Tereso in May 2024  UPDATE Dec 4th 2025: Home sleep study March 2025 showed mild obstructive sleep apnea  She was admitted to [facility] June 19, 2024,  reported loss of consciousness, found to have shunt malfunction, had revision by Dr. Nancye on Oct 8th, gait has much improved, headaches improved as well. Recent few days, she noticed some unsteady gait.  CT head on Oct 7th 2025: 1.  No evidence of acute intracranial hemorrhage or large vascular territory infarct. Consider MRI for further evaluation.  2.  Unchanged positioning of the right frontal approach ventriculostomy catheter with similar size and configuration of the ventricular system.  3.  Redemonstrated calcifications along the left optic nerve sheath consistent with optic nerve meningioma.   CT head at [facility] Jul 02 2024, 1.  Similar positioning of the right frontal approach ventriculostomy catheter with increased rounding of the bilateral temporal horns and slight interval enlargement of the third ventricle, concerning for shunt malfunction and progressive hydrocephalus.   CT head on Jul 04 2024 Post surgical changes of VP shunt revision with decreased size of the ventricular system compared to the head CT from 07/02/2024, now with slitlike appearance of the supratentorial ventricular system with suggestion of interval significant decrease in periventricular hypoattenuation.    Reviewed [facility] neurosurgeon Dr. Garnette Nancye evaluation June 20, 2024, revised the malfunctioning distal catheter adjustable ventricular peritoneal shunt because of worsening hydrocephalus associated worsening gait stability urinary incontinence     PHYSICAL EXAM:   Vitals:   08/16/24 1128  BP: 122/83  Pulse: 62  SpO2: 98%  Weight:  163 lb (73.9 kg)  Height: 5' 2 (1.575 m)   Lying down 138/83, HR 61, sitting up 142/84, HR 74, standing up 121/67, HR58. 118/78, 61.   Body mass index is 29.81 kg/m.  PHYSICAL EXAMNIATION:  Gen: NAD, conversant, well nourised, well groomed                     Cardiovascular: Regular rate rhythm, no peripheral edema, warm, nontender. Eyes: Conjunctivae clear without exudates or hemorrhage Neck: Supple, no carotid bruits. Pulmonary: Clear to auscultation bilaterally   NEUROLOGICAL EXAM:  MENTAL STATUS: Speech/cognition: Awake, alert, oriented to history taking and casual conversation CRANIAL NERVES: CN II: Visual fields are full to confrontation. Pupils are round equal and briskly reactive to light. CN III, IV, VI: extraocular movement are normal. No ptosis. CN V: Facial sensation is intact to light touch CN VII: Face is symmetric with normal eye closure  CN VIII: Hearing is normal to causal conversation. CN IX, X: Phonation is normal. CN XI: Head turning and shoulder shrug are intact  MOTOR: Normal bulk and strength  REFLEXES: Reflexes are 2+ and symmetric at the biceps, triceps, knees, and trace ankles. Plantar responses are extensor bilaterally  SENSORY: Length-dependent decreased light touch, pinprick to mid shin level  COORDINATION: There is no trunk or limb dysmetria noted.  GAIT/STANCE: Needs push-up to get up from seated position, cautious  REVIEW OF SYSTEMS:  Full 14 system review of systems performed and notable only for as above All other review of systems were negative.   ALLERGIES: Allergies  Allergen Reactions   Cymbalta [Duloxetine Hcl] Itching    Nasal itching  Methocarbamol     Propoxyphene N-Acetaminophen Itching   Sulfa Antibiotics     HOME MEDICATIONS: Current Outpatient Medications  Medication Sig Dispense Refill   ACCU-CHEK GUIDE TEST test strip USE TO TEST BLOOD GLUCOSE THREE TIMES DAILY 100 strip 3   Accu-Chek Softclix Lancets  lancets 1 each by Other route daily. as directed 100 each 3   AIMOVIG  140 MG/ML SOAJ Inject 140 mg into the skin every 30 (thirty) days. 3 mL 3   aspirin EC 81 MG tablet Take 2 tablets by mouth daily.     atorvastatin  (LIPITOR) 20 MG tablet Take 1 tablet (20 mg total) by mouth daily. 90 tablet 3   cetirizine  (ZYRTEC ) 10 MG tablet TAKE 1 TABLET EVERY DAY 90 tablet 0   gabapentin  (NEURONTIN ) 300 MG capsule Take 2 capsules (600 mg total) by mouth at bedtime. 60 capsule 11   glucose blood test strip Use to test blood glucose three times daily. 100 each 2   losartan  (COZAAR ) 50 MG tablet Take 1 tablet (50 mg total) by mouth daily. 30 tablet 6   Oxcarbazepine  (TRILEPTAL ) 300 MG tablet Take 1 tablet (300 mg total) by mouth 2 (two) times daily. 180 tablet 3   pantoprazole  (PROTONIX ) 40 MG tablet Take 1 tablet (40 mg total) by mouth daily. 90 tablet 2   propranolol  (INDERAL ) 60 MG tablet Take 1 tablet (60 mg total) by mouth in the morning. 90 tablet 1   propranolol  (INDERAL ) 80 MG tablet TAKE 1 TABLET NIGHTLY TO BE TAKEN IN ADDITION TO 60 MG EVERY MORNING. 90 tablet 3   Semaglutide , 1 MG/DOSE, (OZEMPIC , 1 MG/DOSE,) 4 MG/3ML SOPN Inject 1 mg into the skin once a week. 3 mL 3   sennosides-docusate sodium  (SENOKOT-S) 8.6-50 MG tablet Take 1 tablet by mouth daily. 30 tablet 2   UNABLE TO FIND Aleve  pain relieving lotion-daily     VITAMIN D  PO Take 10,000 Units by mouth daily.     No current facility-administered medications for this visit.    PAST MEDICAL HISTORY: Past Medical History:  Diagnosis Date   Allergy    Anxiety    Arnold-Chiari malformation (HCC)    Arthritis    Borderline diabetic    Chronic headaches    Colon polyps    nonadenomatous   Depression    Diabetes mellitus without complication (HCC)    Diverticulosis    Esophageal stricture    FROZEN LEFT SHOULDER 04/01/2010   Qualifier: Diagnosis of  By: Margrette MD, Taft     GERD (gastroesophageal reflux disease)    HTN  (hypertension)    Hyperlipidemia    IBS (irritable bowel syndrome)    Seizures (HCC)    pt not sure when her last seizure was- she says she just spaces out- no shaking but she could not tell me last seizure-  on [place] bid for seizures    Sleep apnea    no cpap now- did use 02 with cpap but has not used either in years    PAST SURGICAL HISTORY: Past Surgical History:  Procedure Laterality Date   BREAST BIOPSY Left    cahri decompression  8 and 03/1999   CHOLECYSTECTOMY     COLONOSCOPY     invasive cervical traction  02/2009   left breast-lumpectomy     POLYPECTOMY     shunt put in on right side of brain     SHUNT REPLACEMENT  08/2009   SHUNT REPLACEMENT  07/2024   TOTAL ABDOMINAL HYSTERECTOMY  Age 41 yrs   TAH & BSO   TUBAL LIGATION     UPPER GASTROINTESTINAL ENDOSCOPY      FAMILY HISTORY: Family History  Problem Relation Age of Onset   Colon polyps Mother    Breast cancer Mother    Lung cancer Mother    Heart disease Father    Stroke Father    Aortic aneurysm Father    Chiari malformation Daughter    Addison's disease Son    Breast cancer Maternal Aunt    Colon cancer Neg Hx    Esophageal cancer Neg Hx    Rectal cancer Neg Hx    Stomach cancer Neg Hx     SOCIAL HISTORY: Social History   Socioeconomic History   Marital status: Married    Spouse name: Not on file   Number of children: 3   Years of education: Not on file   Highest education level: Some college, no degree  Occupational History   Not on file  Tobacco Use   Smoking status: Former    Current packs/day: 0.00    Types: Cigarettes    Quit date: 09/13/1990    Years since quitting: 33.9    Passive exposure: Never   Smokeless tobacco: Never  Vaping Use   Vaping status: Never Used  Substance and Sexual Activity   Alcohol use: No   Drug use: No   Sexual activity: Not Currently    Birth control/protection: Surgical    Comment: hyst  Other Topics Concern   Not on file  Social History  Narrative   Disabled from Chiari malformation/seizures.. Drinks 4-5 cups of pepsi daily. Does not get regular exercise. Married for 40 years.   Lives with one pet, and husband.    Social Drivers of Corporate Investment Banker Strain: Low Risk  (03/19/2024)   Overall Financial Resource Strain (CARDIA)    Difficulty of Paying Living Expenses: Not hard at all  Recent Concern: Financial Resource Strain - Medium Risk (02/20/2024)   Overall Financial Resource Strain (CARDIA)    Difficulty of Paying Living Expenses: Somewhat hard  Food Insecurity: Low Risk  (06/19/2024)   Received from [facility]   Hunger Vital Sign    Within the past 12 months, you worried that your food would run out before you got money to buy more: Never true    Within the past 12 months, the food you bought just didn't last and you didn't have money to get more. : Never true  Transportation Needs: No Transportation Needs (06/19/2024)   Received from [facility]    In the past 12 months, has lack of reliable transportation kept you from medical appointments, meetings, work or from getting things needed for daily living? : No  Physical Activity: Insufficiently Active (03/19/2024)   Exercise Vital Sign    Days of Exercise per Week: 7 days    Minutes of Exercise per Session: 20 min  Stress: No Stress Concern Present (03/19/2024)   [employer] of Occupational Health - Occupational Stress Questionnaire    Feeling of Stress: Not at all  Social Connections: Moderately Integrated (03/19/2024)   Social Connection and Isolation Panel    Frequency of Communication with Friends and Family: Three times a week    Frequency of Social Gatherings with Friends and Family: More than three times a week    Attends Religious Services: More than 4 times per year    Active Member of Clubs or Organizations: No  Attends Banker Meetings: Never    Marital Status: Married  Recent Concern: Social Connections -  Moderately Isolated (02/20/2024)   Social Connection and Isolation Panel    Frequency of Communication with Friends and Family: More than three times a week    Frequency of Social Gatherings with Friends and Family: Twice a week    Attends Religious Services: Never    Database Administrator or Organizations: No    Attends Banker Meetings: Never    Marital Status: Married  Catering Manager Violence: Not At Risk (04/30/2024)   Humiliation, Afraid, Rape, and Kick questionnaire    Fear of Current or Ex-Partner: No    Emotionally Abused: No    Physically Abused: No    Sexually Abused: No      Modena Callander, M.D. Ph.D.  [facility] [address] [place], [place] [address] Ph: [phone] Fax: [phone]  CC:  Tobie Suzzane POUR, MD [address] [place],  [place] [address]  Tobie Suzzane POUR, MD                     "

## 2024-08-16 NOTE — Telephone Encounter (Signed)
 [facility] will start this patient after the weekend.

## 2024-08-17 ENCOUNTER — Other Ambulatory Visit: Payer: Self-pay

## 2024-08-17 ENCOUNTER — Other Ambulatory Visit (HOSPITAL_COMMUNITY): Payer: Self-pay

## 2024-08-21 ENCOUNTER — Ambulatory Visit: Admitting: Neurology

## 2024-08-21 DIAGNOSIS — G40909 Epilepsy, unspecified, not intractable, without status epilepticus: Secondary | ICD-10-CM | POA: Diagnosis not present

## 2024-08-21 DIAGNOSIS — Z982 Presence of cerebrospinal fluid drainage device: Secondary | ICD-10-CM

## 2024-08-21 DIAGNOSIS — R269 Unspecified abnormalities of gait and mobility: Secondary | ICD-10-CM

## 2024-08-21 DIAGNOSIS — G43709 Chronic migraine without aura, not intractable, without status migrainosus: Secondary | ICD-10-CM

## 2024-08-23 ENCOUNTER — Telehealth: Payer: Self-pay

## 2024-08-23 NOTE — Telephone Encounter (Signed)
 Copied from CRM #[id number]. Topic: General - Other >> Aug 23, 2024  2:22 PM Joesph B wrote: Reason for CRM: patient missed her visit today with PT, [facility] is asking for a delay of care.. starting tomorrow. Patients request.   Please fu with Lorie if provider approves. Can leave a message if she does not answer.

## 2024-08-23 NOTE — Telephone Encounter (Signed)
No phone number provided.

## 2024-08-24 ENCOUNTER — Telehealth: Payer: Self-pay

## 2024-08-24 ENCOUNTER — Ambulatory Visit: Payer: Self-pay

## 2024-08-24 NOTE — Telephone Encounter (Signed)
 Copied from CRM #[id number]. Topic: Clinical - Home Health Verbal Orders >> Aug 24, 2024  1:32 PM Selinda RAMAN wrote: Caller/Agency: Katheryn with [facility] Callback Number: [phone] Service Requested: Skilled Nursing Frequency: 1 x a week for 4 weeks Any new concerns about the patient? Yes she said she has unpredictable dizziness with which I will get her over to NT to get triaged  Please assist patient further

## 2024-08-24 NOTE — Telephone Encounter (Signed)
 FYI Only or Action Required?: Action required by provider: update on patient condition.  Patient was last seen in primary care on 02/29/2024 by Tobie Suzzane POUR, MD.  Called Nurse Triage reporting Dizziness.  Symptoms began about a month ago.  Interventions attempted: Other: limiting bending per neurologist recommendation.  Symptoms are: varies, asymptomatic today.  Triage Disposition: Call Specialist When Office Open (overriding See PCP When Office is Open (Within 3 Days))  Patient/caregiver understands and will follow disposition?: Yes Reason for Disposition  [1] MODERATE dizziness (e.g., vertigo; feels very unsteady, interferes with normal activities) AND [2] has been evaluated by doctor (or NP/PA) for this  Answer Assessment - Initial Assessment Questions Pt reports that she has had intermittent dizziness when she stands up after standing since her most recent neurosurgery in November.   Describes it as a spinning sensation that resolves once she stays still. Patient has also discussed with neurology. They ordered an EEG and recommended PT, which she started today. Saw neurology on 08/16/24.  Pt states she is asymptomatic today. She is following up with neurology closely, but home health nurse told her today that she is supposed to have a PCP follow up as well. She has already scheduled that prior to this call.  Pt advised to continue with close follow ups with neurology. Advised to call 911 if dizziness occurs and does not resolve at rest, if she experiences nausea, vomiting, vision changes, numbness, weakness, confusion. Pt verbalized understanding.  Pt advised to contact neurologist Monday regarding EEG results.  1. DESCRIPTION: Describe your dizziness.     Spinning when standing after bending over  2. VERTIGO: Do you feel like either you or the room is spinning or tilting?      Spinning  3. LIGHTHEADED: Do you feel lightheaded? (e.g., somewhat faint, woozy, weak upon  standing)     See above  4. SEVERITY: How bad is it?  Can you walk?     Severe when it occurs  5. ONSET:  When did the dizziness begin?     After surgery in November  6. AGGRAVATING FACTORS: Does anything make it worse? (e.g., standing, change in head position)     Bending  Protocols used: Dizziness - Vertigo-A-AH   Copied from CRM #[id number]. Topic: Clinical - Red Word Triage >> Aug 24, 2024  1:42 PM Selinda RAMAN wrote: Red Word that prompted transfer to Nurse Triage: Katheryn skilled nurse with [facility] called in along with the patient and the patient is complaining of a weird or different kind of dizziness. She says she feels like her head is spinning around and around. She has been dizzy before but felt more like it was her whole body this she said is weird because it is just her head. She keeps nausea and also has a shunt. I will transfer her to [facility]

## 2024-08-25 ENCOUNTER — Other Ambulatory Visit (HOSPITAL_COMMUNITY): Payer: Self-pay

## 2024-08-26 ENCOUNTER — Other Ambulatory Visit (HOSPITAL_COMMUNITY): Payer: Self-pay

## 2024-08-27 NOTE — Telephone Encounter (Signed)
 Left verbal orders on vmail

## 2024-08-30 ENCOUNTER — Ambulatory Visit: Payer: Self-pay | Admitting: Internal Medicine

## 2024-08-30 ENCOUNTER — Telehealth: Payer: Self-pay | Admitting: Internal Medicine

## 2024-08-30 NOTE — Telephone Encounter (Signed)
 Copied from CRM #[id number]. Topic: General - Other >> Aug 30, 2024 12:56 PM Zebedee SAUNDERS wrote: Reason for CRM: Received call from [facility] per [facility] ph: [phone] stated pt's blood pressure 158/88.

## 2024-08-31 ENCOUNTER — Other Ambulatory Visit: Payer: Self-pay

## 2024-08-31 ENCOUNTER — Other Ambulatory Visit (HOSPITAL_COMMUNITY): Payer: Self-pay

## 2024-08-31 ENCOUNTER — Ambulatory Visit: Payer: Self-pay | Admitting: Internal Medicine

## 2024-08-31 ENCOUNTER — Ambulatory Visit (HOSPITAL_COMMUNITY)
Admission: RE | Admit: 2024-08-31 | Discharge: 2024-08-31 | Disposition: A | Discharge status: Home / Self Care | Source: Ambulatory Visit | Attending: Internal Medicine | Admitting: Internal Medicine

## 2024-08-31 ENCOUNTER — Ambulatory Visit: Admitting: Internal Medicine

## 2024-08-31 VITALS — BP 146/84 | HR 69 | Ht 62.0 in | Wt 163.4 lb

## 2024-08-31 DIAGNOSIS — E1142 Type 2 diabetes mellitus with diabetic polyneuropathy: Secondary | ICD-10-CM

## 2024-08-31 DIAGNOSIS — Z8669 Personal history of other diseases of the nervous system and sense organs: Secondary | ICD-10-CM

## 2024-08-31 DIAGNOSIS — R42 Dizziness and giddiness: Secondary | ICD-10-CM

## 2024-08-31 DIAGNOSIS — E785 Hyperlipidemia, unspecified: Secondary | ICD-10-CM

## 2024-08-31 DIAGNOSIS — Z7985 Long-term (current) use of injectable non-insulin antidiabetic drugs: Secondary | ICD-10-CM | POA: Diagnosis not present

## 2024-08-31 DIAGNOSIS — G43719 Chronic migraine without aura, intractable, without status migrainosus: Secondary | ICD-10-CM | POA: Diagnosis not present

## 2024-08-31 DIAGNOSIS — I10 Essential (primary) hypertension: Secondary | ICD-10-CM | POA: Diagnosis not present

## 2024-08-31 DIAGNOSIS — G919 Hydrocephalus, unspecified: Secondary | ICD-10-CM | POA: Insufficient documentation

## 2024-08-31 DIAGNOSIS — E1169 Type 2 diabetes mellitus with other specified complication: Secondary | ICD-10-CM

## 2024-08-31 MED ORDER — LOSARTAN POTASSIUM 50 MG PO TABS
75.0000 mg | ORAL_TABLET | Freq: Every day | ORAL | 1 refills | Status: AC
  Filled 2024-08-31: qty 135, 90d supply, fill #0
  Filled 2024-09-01: qty 135, 90d supply, fill #1

## 2024-08-31 MED ORDER — MECLIZINE HCL 25 MG PO TABS
25.0000 mg | ORAL_TABLET | Freq: Two times a day (BID) | ORAL | 0 refills | Status: DC | PRN
  Filled 2024-08-31: qty 30, 15d supply, fill #0

## 2024-08-31 NOTE — Assessment & Plan Note (Signed)
 Addendum:  CT head negative for recurrence of hydrocephalus Dizziness could be related to vertigo, meclizine  sent Avoid sudden positional changes Maintain adequate hydration and eat at regular intervals

## 2024-08-31 NOTE — Assessment & Plan Note (Signed)
On statin Checked lipid profile 

## 2024-08-31 NOTE — Assessment & Plan Note (Signed)
On Aimovig and propranolol for ppx Followed by neurology - Dr. Yan 

## 2024-08-31 NOTE — Patient Instructions (Addendum)
 Please get CT head as discussed. Please inform your Neurosurgeon about the CT head later.  Please start taking Losartan  1.5 tablets once daily for blood pressure.  Please continue to take medications as prescribed.  Please continue to follow low carb diet and ambulate as tolerated.

## 2024-08-31 NOTE — Assessment & Plan Note (Signed)
 Lab Results  Component Value Date   HGBA1C 5.7 07/02/2024   Associated with HTN and HLD Well-controlled, glycemic profile improved since starting Ozempic  in 09/24 - agrees to follow low carb diet and small, frequent meals to avoid nausea; Zofran  PRN for nausea Advised to follow diabetic diet On statin F/u CMP and lipid panel Diabetic eye exam: Advised to follow up with Ophthalmology for diabetic eye exam  On gabapentin  600 mg qHS for neuropathy

## 2024-08-31 NOTE — Assessment & Plan Note (Addendum)
 BP: (!) 146/84   Uncontrolled with  Losartan  50 mg QD and Propranolol  80 mg + 60 mg QD now Increased dose of losartan  to 75 mg QD Amlodipine  was recently discontinued due to leg swelling by Cardiology Counseled for compliance with the medications Advised DASH diet and moderate exercise/walking, at least 150 mins/week

## 2024-08-31 NOTE — Addendum Note (Signed)
 Addended byBETHA TOBIE DOWNS on: 08/31/2024 01:08 PM   Modules accepted: Orders

## 2024-08-31 NOTE — Assessment & Plan Note (Signed)
 Has history of Arnold-Chiari malformation VP shunt for hydrocephalus, but had recent increased size of ventricles noted on CT of head - had reprogramming on 10/25/22, and had revision shunt placement in 04/24 and 10/25 as she has recurrent hydrocephalus Followed by neurosurgeon - gait disturbance could be due to hydrocephalus, advised to contact neurosurgeon if persistent gait disturbance Check CT head

## 2024-08-31 NOTE — Assessment & Plan Note (Addendum)
 S/p VP shunt placement, but had recent increasing size of ventricles Had VP shunt revision, last in 10/25, followed by neurosurgeon at [facility]

## 2024-08-31 NOTE — Progress Notes (Addendum)
 "  Established Patient Office Visit  Subjective:  Patient ID: Amanda Webster, female    DOB: 1961-07-12  Age: 63 y.o. MRN: [id number]  CC:  Chief Complaint  Patient presents with   Dizziness    Hospital f/u    Hypertension   Diabetes    HPI Amanda Webster is a 63 y.o. female with past medical history of HTN, type II DM with HLD, polyarthritis, migraine, seizure disorder, allergic rhinitis and morbid obesity who presents for f/u of her chronic medical conditions.  She has had 2 hospitalizations in 10/25 due to hydrocephalus from VP shunt malfunctioning.  She had VP shunt revision surgery on 07/03/24, after which she had been feeling better until the last week.  Her gait had improved and did not have any spells of dizziness initially.  She reports having dizzy spells and swaying sideways since the last week.  Denies any visual disturbance currently, but does have room spinning sensation, which is intermittent.  Does not report urinary incontinence.  HTN: Her BP is elevated today.  It has been elevated around 150s/80s at home as well.  She takes losartan  50 mg QD and propranolol  80 mg QAM and 60 mg QPM (mainly for migraine).  Cardiologist discontinued amlodipine  5 mg once daily due to leg swelling. She denies any dizziness, chest pain, dyspnea or palpitations currently.  Type II DM: She is taking Ozempic  1 mg QW currently. Her last HbA1c was 5.8 in 10/25. She has lost about 30 lbs since starting Ozempic  1 mg qw in 09/24.  Her blood glucose has been around 100-130 lately.  She has tried metformin, but had GI discomfort with it as well.  Denies any polyuria or polyphagia currently.  Hydrocephalus: She had revision of VP shunt in 10/25 again due to recurrence of hydrocephalus.  She still reports fatigue and weakness.  Her vision has improved now. She had EEG in 01/24, which was unremarkable.  She had NCS/EMG by Dr Onita, which showed mild axonal sensorimotor neuropathy.  She is already on  gabapentin  600 mg qHS. She has felt progressive leg weakness lately as well, which is also due to OA of knee.  OSA: She has started using CPAP device for it, but did not tolerate different masks. She had sleep study done in the past, was ordered by Dr. Milton.  As she was not tolerating CPAP device, she was using oxygen via nasal cannula at nighttime for nocturnal hypoxia, but has stopped using it for a long time now.  She reports dyspnea at nighttime recently.  Denies chest pain or palpitations.  Past Medical History:  Diagnosis Date   Allergy    Anxiety    Arnold-Chiari malformation (HCC)    Arthritis    Borderline diabetic    Chronic headaches    Colon polyps    nonadenomatous   Depression    Diabetes mellitus without complication (HCC)    Diverticulosis    Esophageal stricture    FROZEN LEFT SHOULDER 04/01/2010   Qualifier: Diagnosis of  By: Margrette MD, Taft     GERD (gastroesophageal reflux disease)    HTN (hypertension)    Hyperlipidemia    IBS (irritable bowel syndrome)    Seizures (HCC)    pt not sure when her last seizure was- she says she just spaces out- no shaking but she could not tell me last seizure-  on Oxtellar bid for seizures    Sleep apnea    no cpap now- did use 02  with cpap but has not used either in years    Past Surgical History:  Procedure Laterality Date   BREAST BIOPSY Left    cahri decompression  8 and 03/1999   CHOLECYSTECTOMY     COLONOSCOPY     invasive cervical traction  02/2009   left breast-lumpectomy     POLYPECTOMY     shunt put in on right side of brain     SHUNT REPLACEMENT  08/2009   SHUNT REPLACEMENT  07/2024   TOTAL ABDOMINAL HYSTERECTOMY  Age 30 yrs   TAH & BSO   TUBAL LIGATION     UPPER GASTROINTESTINAL ENDOSCOPY      Family History  Problem Relation Age of Onset   Colon polyps Mother    Breast cancer Mother    Lung cancer Mother    Heart disease Father    Stroke Father    Aortic aneurysm Father    Chiari  malformation Daughter    Addison's disease Son    Breast cancer Maternal Aunt    Colon cancer Neg Hx    Esophageal cancer Neg Hx    Rectal cancer Neg Hx    Stomach cancer Neg Hx     Social History   Socioeconomic History   Marital status: Married    Spouse name: Not on file   Number of children: 3   Years of education: Not on file   Highest education level: Some college, no degree  Occupational History   Not on file  Tobacco Use   Smoking status: Former    Current packs/day: 0.00    Types: Cigarettes    Quit date: 09/13/1990    Years since quitting: 33.9    Passive exposure: Never   Smokeless tobacco: Never  Vaping Use   Vaping status: Never Used  Substance and Sexual Activity   Alcohol use: No   Drug use: No   Sexual activity: Not Currently    Birth control/protection: Surgical    Comment: hyst  Other Topics Concern   Not on file  Social History Narrative   Disabled from Chiari malformation/seizures.. Drinks 4-5 cups of pepsi daily. Does not get regular exercise. Married for 40 years.   Lives with one pet, and husband.    Social Drivers of Health   Tobacco Use: Medium Risk (08/31/2024)   Patient History    Smoking Tobacco Use: Former    Smokeless Tobacco Use: Never    Passive Exposure: Never  Physicist, Medical Strain: Low Risk (03/19/2024)   Overall Financial Resource Strain (CARDIA)    Difficulty of Paying Living Expenses: Not hard at all  Recent Concern: Financial Resource Strain - Medium Risk (02/20/2024)   Overall Financial Resource Strain (CARDIA)    Difficulty of Paying Living Expenses: Somewhat hard  Food Insecurity: Low Risk (06/19/2024)   Received from [facility]   Epic    Within the past 12 months, you worried that your food would run out before you got money to buy more: Never true    Within the past 12 months, the food you bought just didn't last and you didn't have money to get more. : Never true  Transportation Needs: No Transportation Needs  (06/19/2024)   Received from [facility]    In the past 12 months, has lack of reliable transportation kept you from medical appointments, meetings, work or from getting things needed for daily living? : No  Physical Activity: Insufficiently Active (03/19/2024)   Exercise Vital Sign  Days of Exercise per Week: 7 days    Minutes of Exercise per Session: 20 min  Stress: No Stress Concern Present (03/19/2024)   [employer] of Occupational Health - Occupational Stress Questionnaire    Feeling of Stress: Not at all  Social Connections: Moderately Integrated (03/19/2024)   Social Connection and Isolation Panel    Frequency of Communication with Friends and Family: Three times a week    Frequency of Social Gatherings with Friends and Family: More than three times a week    Attends Religious Services: More than 4 times per year    Active Member of [employer] or Organizations: No    Attends Banker Meetings: Never    Marital Status: Married  Recent Concern: Social Connections - Moderately Isolated (02/20/2024)   Social Connection and Isolation Panel    Frequency of Communication with Friends and Family: More than three times a week    Frequency of Social Gatherings with Friends and Family: Twice a week    Attends Religious Services: Never    Database Administrator or Organizations: No    Attends Banker Meetings: Never    Marital Status: Married  Catering Manager Violence: Not At Risk (04/30/2024)   Epic    Fear of Current or Ex-Partner: No    Emotionally Abused: No    Physically Abused: No    Sexually Abused: No  Depression (PHQ2-9): Low Risk (08/31/2024)   Depression (PHQ2-9)    PHQ-2 Score: 0  Alcohol Screen: Low Risk (03/19/2024)   Alcohol Screen    Last Alcohol Screening Score (AUDIT): 0  Housing: Low Risk (06/19/2024)   Received from [facility]   Epic    What is your living situation today?: I have a steady place to live    Think about the  place you live. Do you have problems with any of the following? Choose all that apply:: None/None on this list  Utilities: Low Risk (06/19/2024)   Received from [facility]   Utilities    In the past 12 months has the electric, gas, oil, or water company threatened to shut off services in your home? : No  Health Literacy: Adequate Health Literacy (03/19/2024)   B1300 Health Literacy    Frequency of need for help with medical instructions: Never    Outpatient Medications Prior to Visit  Medication Sig Dispense Refill   ACCU-CHEK GUIDE TEST test strip USE TO TEST BLOOD GLUCOSE THREE TIMES DAILY 100 strip 3   Accu-Chek Softclix Lancets lancets 1 each by Other route daily. as directed 100 each 3   AIMOVIG  140 MG/ML SOAJ Inject 140 mg into the skin every 30 (thirty) days. 3 mL 3   aspirin EC 81 MG tablet Take 2 tablets by mouth daily.     atorvastatin  (LIPITOR) 20 MG tablet Take 1 tablet (20 mg total) by mouth daily. 90 tablet 3   cetirizine  (ZYRTEC ) 10 MG tablet TAKE 1 TABLET EVERY DAY 90 tablet 0   gabapentin  (NEURONTIN ) 300 MG capsule Take 2 capsules (600 mg total) by mouth at bedtime. 180 capsule 3   glucose blood test strip Use to test blood glucose three times daily. 100 each 2   Oxcarbazepine  (TRILEPTAL ) 300 MG tablet Take 1 tablet (300 mg total) by mouth 2 (two) times daily. 180 tablet 3   pantoprazole  (PROTONIX ) 40 MG tablet Take 1 tablet (40 mg total) by mouth daily. 90 tablet 2   propranolol  (INDERAL ) 60 MG tablet Take 1  tablet (60 mg total) by mouth in the morning. 90 tablet 1   propranolol  (INDERAL ) 80 MG tablet TAKE 1 TABLET NIGHTLY TO BE TAKEN IN ADDITION TO 60 MG EVERY MORNING. 90 tablet 3   rizatriptan  (MAXALT -MLT) 5 MG disintegrating tablet Take 1 tablet (5 mg total) by mouth as needed. May repeat in 2 hours if needed 10 tablet 6   Semaglutide , 1 MG/DOSE, (OZEMPIC , 1 MG/DOSE,) 4 MG/3ML SOPN Inject 1 mg into the skin once a week. 3 mL 3   sennosides-docusate sodium  (SENOKOT-S)  8.6-50 MG tablet Take 1 tablet by mouth daily. 30 tablet 2   UNABLE TO FIND Aleve  pain relieving lotion-daily     VITAMIN D  PO Take 10,000 Units by mouth daily.     losartan  (COZAAR ) 50 MG tablet Take 1 tablet (50 mg total) by mouth daily. 30 tablet 6   No facility-administered medications prior to visit.    Allergies  Allergen Reactions   Cymbalta [Duloxetine Hcl] Itching    Nasal itching   Methocarbamol     Propoxyphene N-Acetaminophen Itching   Sulfa Antibiotics     ROS Review of Systems  Constitutional:  Positive for fatigue. Negative for chills and fever.  HENT:  Negative for congestion, sinus pressure and sinus pain.   Eyes:  Negative for pain and discharge.  Respiratory:  Positive for shortness of breath (Intermittent). Negative for cough.   Cardiovascular:  Negative for chest pain and palpitations.  Gastrointestinal:  Positive for constipation. Negative for diarrhea, nausea and vomiting.  Genitourinary:  Negative for frequency and hematuria.  Musculoskeletal:  Positive for arthralgias, back pain and gait problem. Negative for neck pain and neck stiffness.  Skin:  Negative for rash.  Neurological:  Positive for dizziness, weakness and headaches (Chronic).  Psychiatric/Behavioral:  Negative for agitation and behavioral problems.       Objective:    Physical Exam Vitals reviewed.  Constitutional:      General: She is not in acute distress.    Appearance: She is obese. She is not diaphoretic.  HENT:     Head: Normocephalic and atraumatic.     Nose: Nose normal.     Mouth/Throat:     Mouth: Mucous membranes are moist.  Eyes:     General: No scleral icterus.    Extraocular Movements: Extraocular movements intact.  Cardiovascular:     Rate and Rhythm: Normal rate and regular rhythm.     Heart sounds: Normal heart sounds. No murmur heard. Pulmonary:     Breath sounds: Normal breath sounds. No wheezing or rales.  Musculoskeletal:     Cervical back: Neck supple. No  tenderness.     Right lower leg: No edema.     Left lower leg: No edema.  Skin:    General: Skin is warm.     Findings: No rash.  Neurological:     General: No focal deficit present.     Mental Status: She is alert and oriented to person, place, and time.     Sensory: Sensory deficit (b/l LE) present.     Motor: Weakness (RUE - 4/5, b/l LE - 4/5) present.  Psychiatric:        Mood and Affect: Mood normal.        Behavior: Behavior normal.     BP (!) 146/84 (BP Location: Left Arm)   Pulse 69   Ht 5' 2 (1.575 m)   Wt 163 lb 6.4 oz (74.1 kg)   SpO2 98%   BMI 29.89  kg/m  Wt Readings from Last 3 Encounters:  08/31/24 163 lb 6.4 oz (74.1 kg)  08/16/24 163 lb (73.9 kg)  07/25/24 166 lb 6.4 oz (75.5 kg)    Lab Results  Component Value Date   TSH 0.598 02/29/2024   Lab Results  Component Value Date   WBC 7.1 02/29/2024   HGB 14.4 02/29/2024   HCT 41.2 02/29/2024   MCV 93 02/29/2024   PLT 217 02/29/2024   Lab Results  Component Value Date   NA 139 02/27/2024   K 3.6 02/27/2024   CO2 22 02/27/2024   GLUCOSE 117 (H) 02/27/2024   BUN 6 (L) 02/27/2024   CREATININE 0.88 02/27/2024   BILITOT 0.4 10/31/2023   ALKPHOS 151 (H) 10/31/2023   AST 21 10/31/2023   ALT 24 10/31/2023   PROT 7.1 10/31/2023   ALBUMIN 4.8 10/31/2023   CALCIUM  9.6 02/27/2024   ANIONGAP 8 03/25/2021   EGFR 75 02/27/2024   Lab Results  Component Value Date   CHOL 155 02/29/2024   Lab Results  Component Value Date   HDL 55 02/29/2024   Lab Results  Component Value Date   LDLCALC 80 02/29/2024   Lab Results  Component Value Date   TRIG 112 02/29/2024   Lab Results  Component Value Date   CHOLHDL 2.8 02/29/2024   Lab Results  Component Value Date   HGBA1C 5.7 07/02/2024      Assessment & Plan:   Problem List Items Addressed This Visit       Cardiovascular and Mediastinum   Essential hypertension   BP: (!) 146/84   Uncontrolled with  Losartan  50 mg QD and Propranolol  80 mg  + 60 mg QD now Increased dose of losartan  to 75 mg QD Amlodipine  was recently discontinued due to leg swelling by Cardiology Counseled for compliance with the medications Advised DASH diet and moderate exercise/walking, at least 150 mins/week      Relevant Medications   losartan  (COZAAR ) 50 MG tablet   Migraine   On Aimovig  and propranolol  for ppx Followed by neurology - Dr. Onita      Relevant Medications   losartan  (COZAAR ) 50 MG tablet     Endocrine   DM type 2 with diabetic peripheral neuropathy (HCC)   Lab Results  Component Value Date   HGBA1C 5.7 07/02/2024   Associated with HTN and HLD Well-controlled, glycemic profile improved since starting Ozempic  in 09/24 - agrees to follow low carb diet and small, frequent meals to avoid nausea; Zofran  PRN for nausea Advised to follow diabetic diet On statin F/u CMP and lipid panel Diabetic eye exam: Advised to follow up with Ophthalmology for diabetic eye exam  On gabapentin  600 mg qHS for neuropathy      Relevant Medications   losartan  (COZAAR ) 50 MG tablet   Other Relevant Orders   Hemoglobin A1c (Completed)   Urine Microalbumin w/creat. ratio   Hyperlipidemia associated with type 2 diabetes mellitus (HCC)   On statin Checked lipid profile      Relevant Medications   losartan  (COZAAR ) 50 MG tablet     Nervous and Auditory   Hydrocephalus (HCC) - Primary   Has history of Arnold-Chiari malformation VP shunt for hydrocephalus, but had recent increased size of ventricles noted on CT of head - had reprogramming on 10/25/22, and had revision shunt placement in 04/24 and 10/25 as she has recurrent hydrocephalus Followed by neurosurgeon - gait disturbance could be due to hydrocephalus, advised to contact neurosurgeon if  persistent gait disturbance Check CT head      Relevant Orders   CT HEAD WO CONTRAST ( ) (Completed)     Other   History of Chiari malformation   S/p VP shunt placement, but had recent increasing size  of ventricles Had VP shunt revision, last in 10/25, followed by neurosurgeon at [facility]      Vertigo   Addendum:  CT head negative for recurrence of hydrocephalus Dizziness could be related to vertigo, meclizine  sent Avoid sudden positional changes Maintain adequate hydration and eat at regular intervals      Relevant Medications   meclizine  (ANTIVERT ) 25 MG tablet       Meds ordered this encounter  Medications   losartan  (COZAAR ) 50 MG tablet    Sig: Take 1.5 tablets (75 mg total) by mouth daily.    Dispense:  135 tablet    Refill:  1   meclizine  (ANTIVERT ) 25 MG tablet    Sig: Take 1 tablet (25 mg total) by mouth 2 (two) times daily as needed for dizziness.    Dispense:  30 tablet    Refill:  0    Follow-up: Return in about 2 months (around 11/01/2024) for DM and HTN.    Suzzane MARLA Blanch, MD "

## 2024-09-01 ENCOUNTER — Other Ambulatory Visit (HOSPITAL_COMMUNITY): Payer: Self-pay

## 2024-09-01 ENCOUNTER — Other Ambulatory Visit: Payer: Self-pay | Admitting: Internal Medicine

## 2024-09-01 DIAGNOSIS — R42 Dizziness and giddiness: Secondary | ICD-10-CM

## 2024-09-01 MED FILL — Pantoprazole Sodium EC Tab 40 MG (Base Equiv): ORAL | 90 days supply | Qty: 90 | Fill #1 | Status: AC

## 2024-09-02 LAB — MICROALBUMIN / CREATININE URINE RATIO
Creatinine, Urine: 240.8 mg/dL
Microalb/Creat Ratio: 7 mg/g{creat} (ref 0–29)
Microalbumin, Urine: 17.6 ug/mL

## 2024-09-03 ENCOUNTER — Other Ambulatory Visit (HOSPITAL_COMMUNITY): Payer: Self-pay

## 2024-09-03 MED ORDER — MECLIZINE HCL 25 MG PO TABS
25.0000 mg | ORAL_TABLET | Freq: Two times a day (BID) | ORAL | 0 refills | Status: AC | PRN
  Filled 2024-09-03: qty 30, 15d supply, fill #0

## 2024-09-03 NOTE — Progress Notes (Signed)
 Pharmacy Quality Measure Review  This patient is appearing on a report for being at risk of failing the adherence measure for diabetes medications this calendar year.   Medication: Ozempic  1 mg Last fill date: 08/27/24 for 28 day supply  Insurance report was not up to date. No action needed at this time.   Jenkins Graces, PharmD PGY1 Pharmacy Resident

## 2024-09-04 ENCOUNTER — Other Ambulatory Visit: Payer: Self-pay

## 2024-09-04 ENCOUNTER — Other Ambulatory Visit (HOSPITAL_COMMUNITY): Payer: Self-pay

## 2024-09-05 ENCOUNTER — Other Ambulatory Visit (HOSPITAL_COMMUNITY): Payer: Self-pay

## 2024-09-05 NOTE — Anesthesia Postprocedure Evaluation (Signed)
 Patient: Amanda Webster [employer] [facility]  Procedure Summary     Date: 09/05/24 Room / Location: [address] [facility]FT OR 13 / [facility] OR   Anesthesia Start: 1112 Anesthesia Stop: 1314   Procedure: (E4) REMOVAL/REVISION SHUNT VENTRICULAR PERITONEAL ADULT and INSERTION/REVISION SHUNT Pleural ADULT (Right: Chest) Diagnosis:      Other hydrocephalus (CMD)     (Other hydrocephalus (CMD) [G91.8])   Surgeons: Garnette Bouquet, MD Responsible Provider: Wiley Myrtis Cole, MD   Anesthesia Type: general ASA Status: 3       Anesthesia Type: general  Vitals Value Taken Time  BP 146/59 09/05/24 13:50  Temp 97.4 F (36.3 C) 09/05/24 13:05  Pulse 81 09/05/24 13:55  Resp 14 09/05/24 13:55  SpO2 93 % 09/05/24 13:55  Vitals shown include unfiled device data.  There were no known notable events for this encounter.  Anesthesia Post Evaluation  Final anesthesia type: general Patient location during evaluation: PACU Patient participation: Patient participated Level of consciousness: awake and alert Pain score: pain well controlled (patient comfortable/resting) Pain management: adequately controlled during entire PACU stay Post-op nausea and vomiting?: none Post-op vital signs: post-procedure vital signs are stable Patient temperature: Normothermic Cardiovascular status: hemodynamically stable Respiratory status: Stable, room air, spontaneous Hydration status: adequately hydrated Post-op disposition: ICU Anesthesia post-op complications?:no complications Comments: Pt withdrawing to pain on arrival to pacu and unchanged during stay. Neurosurgery team aware. Stable for transfer back to neuro ICU.

## 2024-09-08 NOTE — Discharge Summary (Signed)
 NEUROSURGERY DISCHARGE SUMMARY  Patient ID: Amanda Webster [facility] [id number] 63 y.o. January 09, 1961  Admit date: 09/04/2024 Discharge date: Sat 09/08/2024 Physician:  Garnette Bouquet, MD Diagnoses:  Hydrocephalus (CMD) [G91.9] Altered mental status, unspecified altered mental status type [R41.82]   Procedures/Surgeries performed during hospitalization: REMOVAL/REVISION SHUNT VENTRICULAR PERITONEAL ADULT and INSERTION/REVISION SHUNT Pleural ADULT   Hospital Course:   Amanda Webster is a 63 y.o. female with PMH Chiari malformation, s/p decompression complicated with hydrocephalus s/p VP shunt placement in 2005, s/p revision on 08/20/22 and 01/09/23 (changed to Certas) and then distal revisions on 06/20/24 with Dr.Tatter and 10/21 with Dr. Vonna, Lesta @ 1 who presented with 24 hours of headache and nausea/vomiting consistent with shunt failure symptoms with CT head demonstrating diffuse ventriculomegaly. Patient was initially temporized with shunt taps prior to undergoing placement of ventriculopleural shunt on 12/24 with Dr. Bouquet. She  tolerated the procedure well without complications. She was monitored on the neurosurgical ICU then floor postoperatively where at first she had difficulty swawllowing which quickly improved with MBS yesterday demonstrating appropriate swallow function. She met surgical milestones appropriately. On the day of discharge she reports adequate pain control with an oral pain medication regimen, is ambulating independently and tolerating house diet without difficulty.  She is amenable to the plan for discharge.        Discharge Exam: 63 y.o. female resting in floor bed Awakens easily to voice Oriented x3 PERRL, EOMI Following commands in all extremities with at least antigravity strength incision c/d/i   Discharge Medication List:   Medication List     ASK your doctor about these medications    Accu-Chek Aviva Plus test strp test strip Generic drug:  glucose blood 1 each by Other route 3 (three) times a day.   Accu-Chek Guide Me Glucose Mtr Misc Generic drug: blood-glucose meter 1 each by Other route 3 (three) times a day.   Accu-Chek Soft Dev Lancets Kit Generic drug: lancing device with lancets 1 each by Other route 3 (three) times a day.   Accu-Chek Softclix Lancets Misc Generic drug: Lancets 1 each by Other route 3 (three) times a day.   acetaminophen 325 mg tablet Commonly known as: TYLENOL Take 2 tablets (650 mg total) by mouth every 6 (six) hours as needed for mild pain (1-3), headaches or fever 100.4 F or GREATER (Fever GREATER THAN 100.4 F(38 C)). Do not take within 6hrs of NORCO (Hydrocodone)  Max dose of Acetaminophen daily is 3,000mg . DO NOT Exceed 3,000mg  of acetaminophen daily.   Aimovig  Autoinjector 140 mg/mL Atin Generic drug: erenumab -aooe Inject 140 mg under the skin every 30 (thirty) days.   aspirin 81 mg EC tablet Wait to take this until your doctor or other care provider tells you to start again. Please follow up at return appointment when to resume Take 2 tablets (162 mg total) by mouth every morning. Hold until follow-up with neurosurgery.   atorvastatin  20 mg tablet Commonly known as: LIPITOR Take 20 mg by mouth nightly.   calcium  carbonate 500 mg (200 mg calcium ) chewable tablet Commonly known as: TUMS Chew 2 tablets 3 (three) times a day as needed for heartburn.   camphor-menthoL 4-10 % Crea Apply 1 Application topically as needed in the morning and 1 Application as needed at noon and 1 Application as needed in the evening and 1 Application as needed before bedtime.   carboxymethylcellulose 0.5 % Dpet Commonly known as: REFRESH PLUS Administer 1 drop into both eyes 3 (three) times a day  as needed (dry eye).   cyclobenzaprine 5 mg tablet Commonly known as: FLEXERIL Take 1 tablet (5 mg total) by mouth 3 (three) times a day as needed for muscle spasms.   gabapentin  300 mg capsule Commonly  known as: NEURONTIN  Take 600 mg by mouth nightly.   losartan  50 mg tablet Commonly known as: COZAAR  Take 50 mg by mouth daily.   meclizine  25 mg tablet Commonly known as: ANTIVERT  Take 25 mg by mouth 2 (two) times a day as needed for dizziness.   ondansetron  4 mg disintegrating tablet Commonly known as: ZOFRAN -ODT Dissolve 1 tablet (4 mg total) on tongue every 8 (eight) hours as needed for nausea.   OXcarbazepine  300 mg tablet Commonly known as: TRILEPTAL  Take 300 mg by mouth 2 (two) times a day.   Ozempic  1 mg/dose (4 mg/3 mL) subcutaneous pen injector Generic drug: semaglutide  Inject 1 mg under the skin every 7 days.   pantoprazole  40 mg EC tablet Commonly known as: PROTONIX  Take 40 mg by mouth daily.   polyethylene glycol 17 gram Powd powder Commonly known as: MIRALAX  Take 17 g by mouth daily as needed.   * propranoloL  60 mg tablet Commonly known as: INDERAL  Take 60 mg by mouth every morning.   * propranoloL  80 mg tablet Commonly known as: INDERAL  Take 80 mg by mouth nightly.   rizatriptan  MLT 5 mg disintegrating tablet Commonly known as: MAXALT -MLT Dissolve 5 mg on tongue as needed for migraine. may repeat in 2 hours if needed      * * This list has 2 medication(s) that are the same as other medications prescribed for you. Read the directions carefully, and ask your doctor or other care provider to review them with you.           Disposition:  home  Patient Instructions:    See After Visit Summary for procedure specific home care instructions.  Call for fever greater than 101.5, redness, swelling or drainage at your incision site, new or worsening pain, loss of function, new numbness or tingling. Call for any other question or concern.  The number to call is [phone] Do not bend, lift, drive, move furniture or perform strenuous activity cleared at your follow up appointment.  Never combine controlled substance medications with alcohol.  Never drive  while taking controlled substance medications.    Follow-up with  Your scheduled appointments are listed below. If you have any questions please call [phone]. Appointments which have been scheduled for you    Jan 24, 2025 11:15 AM CT Head Wo Contrast with [facility] CT20 [facility] - RADIOLOGY CT [facility]) [facility] [place] [address] [phone]  Please bring a list of medications, insurance card and a photo ID with you to the appointment.    Jan 24, 2025 1:00 PM Office Visit with Garnette Bouquet, MD [facility] [place] [facility] Neurosurgery ([facility]) [facility] [place] [address] [phone]  Please arrive 15 minutes prior to your scheduled visit.          *Some images could not be shown.

## 2024-09-09 NOTE — Progress Notes (Signed)
 Case Management Discharge Note        CSN: [id number] DOB: Aug 31, 1961 Service: Neurosurgery Location: A563/A  Patient Class: Inpatient  DC Disposition: : Home or Self Care  Discharge DC Disposition: : Home or Self Care  Discharge Referrals Case closed, patient/family agree with disposition plan: Yes       Case Management Coordination Status: Coordination Complete     Amanda Webster, MSW

## 2024-09-10 ENCOUNTER — Telehealth: Payer: Self-pay

## 2024-09-10 NOTE — Transitions of Care (Post Inpatient/ED Visit) (Signed)
" ° °  09/10/2024  Name: Amanda Webster MRN: [id number] DOB: [birth year]  Today's TOC FU Call Status: Today's TOC FU Call Status:: Unsuccessful Call (1st Attempt) Unsuccessful Call (1st Attempt) Date: 09/10/24  Attempted to reach the patient regarding the most recent Inpatient/ED visit.  RN CM left confidential message at designated contact number.   Follow Up Plan: Additional outreach attempts will be made to reach the patient to complete the Transitions of Care (Post Inpatient/ED visit) call.    Bing Edison MSN, RN RN [facility] [employer] [facility]  [facility] Office Hours M-F [phone] Direct Dial: [phone] Main Phone [phone]  Fax: [phone] [email or web address]    "

## 2024-09-11 NOTE — Telephone Encounter (Signed)
 Patient calling in asking if she is needing a suture removal appt as one has not been scheduled.   Please advise.

## 2024-09-11 NOTE — Telephone Encounter (Signed)
 OK to schedule with any of our nurses or I can see if they are full.

## 2024-09-12 ENCOUNTER — Telehealth: Payer: Self-pay

## 2024-09-12 NOTE — Transitions of Care (Post Inpatient/ED Visit) (Signed)
" ° °  09/12/2024  Name: Amanda Webster MRN: [id number] DOB: 26-May-1961  Today's TOC FU Call Status: Today's TOC FU Call Status:: Unsuccessful Call (2nd Attempt) Unsuccessful Call (2nd Attempt) Date: 09/12/24  Attempted to reach the patient regarding the most recent Inpatient/ED visit.  TOC RN CM left confidential voice mail on designated cell phone number per DPR in EPIC, identified TOC RN CM as caller, and left a call back number, also noting that patient may leave a secure voice mail at this call back number.      Follow Up Plan: Additional outreach attempts will be made to reach the patient to complete the Transitions of Care (Post Inpatient/ED visit) call.    Bing Edison MSN, RN RN [employer] [facility]  [facility] Office Hours M-F [phone] Direct Dial: [phone] Main Phone [phone]  Fax: [phone] [email or web address]    "

## 2024-09-17 ENCOUNTER — Ambulatory Visit: Payer: Self-pay | Admitting: Neurology

## 2024-09-17 NOTE — Procedures (Signed)
" ° °  HISTORY: 64 year old female, history of VP shunt placement, seizure  TECHNIQUE:  This is a routine 16 channel EEG recording with one channel devoted to a limited EKG recording.  It was performed during wakefulness, drowsiness and asleep.  Hyperventilation and photic stimulation were performed as activating procedures.  There are minimum muscle and movement artifact noted.  Upon maximum arousal, posterior dominant waking rhythm consistent of rhythmic alpha range activity. Activities are symmetric over the bilateral posterior derivations and attenuated with eye opening.  Photic stimulation did not alter the tracing.  Hyperventilation produced mild/moderate buildup with higher amplitude and the slower activities noted.  During EEG recording, patient developed drowsiness and entered sleep, sleep EEG demonstrated architecture, there were frontal centrally dominant vertex waves and symmetric sleep spindles noted.  During EEG recording, there was no epileptiform discharge noted.  EKG demonstrate normal sinus rhythm.  CONCLUSION: This is a  normal awake and asleep EEG.  There is no electrodiagnostic evidence of epileptiform discharge.  Amanda Webster, M.D. Ph.D.  [facility] [address] [place], [place] [address] Phone: [phone] Fax:      [phone]  "

## 2024-09-24 ENCOUNTER — Ambulatory Visit: Payer: Self-pay | Admitting: Internal Medicine

## 2024-09-24 ENCOUNTER — Encounter: Payer: Self-pay | Admitting: Internal Medicine

## 2024-09-24 ENCOUNTER — Other Ambulatory Visit (HOSPITAL_BASED_OUTPATIENT_CLINIC_OR_DEPARTMENT_OTHER): Payer: Self-pay

## 2024-09-24 VITALS — BP 104/74 | HR 66 | Ht 62.0 in | Wt 153.6 lb

## 2024-09-24 DIAGNOSIS — E1142 Type 2 diabetes mellitus with diabetic polyneuropathy: Secondary | ICD-10-CM

## 2024-09-24 DIAGNOSIS — I10 Essential (primary) hypertension: Secondary | ICD-10-CM

## 2024-09-24 DIAGNOSIS — Z7985 Long-term (current) use of injectable non-insulin antidiabetic drugs: Secondary | ICD-10-CM

## 2024-09-24 DIAGNOSIS — E1159 Type 2 diabetes mellitus with other circulatory complications: Secondary | ICD-10-CM

## 2024-09-24 DIAGNOSIS — K219 Gastro-esophageal reflux disease without esophagitis: Secondary | ICD-10-CM

## 2024-09-24 DIAGNOSIS — G919 Hydrocephalus, unspecified: Secondary | ICD-10-CM | POA: Diagnosis not present

## 2024-09-24 DIAGNOSIS — Z09 Encounter for follow-up examination after completed treatment for conditions other than malignant neoplasm: Secondary | ICD-10-CM | POA: Insufficient documentation

## 2024-09-24 MED ORDER — OZEMPIC (0.25 OR 0.5 MG/DOSE) 2 MG/3ML ~~LOC~~ SOPN
0.5000 mg | PEN_INJECTOR | SUBCUTANEOUS | 3 refills | Status: AC
  Filled 2024-09-24: qty 3, 28d supply, fill #0

## 2024-09-24 MED ORDER — FAMOTIDINE 40 MG PO TABS
40.0000 mg | ORAL_TABLET | Freq: Every day | ORAL | 5 refills | Status: AC
  Filled 2024-09-24: qty 30, 30d supply, fill #0
  Filled 2024-10-01: qty 30, 30d supply, fill #1

## 2024-09-24 NOTE — Assessment & Plan Note (Addendum)
 BP: 104/74   Well-controlled with  Losartan  50 mg QD and Propranolol  80 mg + 60 mg QD now Had increased dose to 75 mg in the last visit, but considering her BP today, would prefer to continue 50 mg QD Amlodipine  was recently discontinued due to leg swelling by Cardiology Counseled for compliance with the medications Advised DASH diet and moderate exercise/walking, at least 150 mins/week

## 2024-09-24 NOTE — Patient Instructions (Addendum)
 Please contact Neurosurgeon's office for gait disturbance.  Please take Ozempic  0.5 mg instead of 1 mg per week.  Please continue taking Losartan  50 mg as prescribed.

## 2024-09-24 NOTE — Assessment & Plan Note (Signed)
 Nausea and epigastric pain could be due to starving/acute gastritis Needs to follow small, frequent meals Zofran  as needed for nausea Added Pepcid  for acid reflux

## 2024-09-24 NOTE — Assessment & Plan Note (Signed)
 Lab Results  Component Value Date   HGBA1C 5.7 07/02/2024   Associated with HTN and HLD Well-controlled, glycemic profile improved since starting Ozempic  in 09/24 - agrees to follow low carb diet and small, frequent meals to avoid nausea; Zofran  PRN for nausea Reduced dose of Ozempic  to 0.5 mg QW for now due to lack of appetite although it is likely due to hydrocephalus Advised to follow diabetic diet On statin F/u CMP and lipid panel Diabetic eye exam: Advised to follow up with Ophthalmology for diabetic eye exam  On gabapentin  600 mg qHS for neuropathy

## 2024-09-24 NOTE — Assessment & Plan Note (Signed)
 Has history of Arnold-Chiari malformation VP shunt for hydrocephalus, but had recent increased size of ventricles noted on CT of head - had reprogramming on 10/25/22, and had revision shunt placement in 04/24, 10/25, 11/25 and 12/25 as she has recurrent hydrocephalus Followed by neurosurgeon - gait disturbance likely due to hydrocephalus, advised to contact neurosurgeon

## 2024-09-24 NOTE — Progress Notes (Signed)
 "  Established Patient Office Visit  Subjective:  Patient ID: Amanda Webster, female    DOB: April 01, 1961  Age: 64 y.o. MRN: [id number]  CC:  Chief Complaint  Patient presents with   Hospitalization Follow-up    Balance concerns.    HPI Amanda Webster is a 64 y.o. female with past medical history of HTN, type II DM with HLD, polyarthritis, migraine, seizure disorder, hydrocephalus s/p VP shunt placement, allergic rhinitis and morbid obesity who presents for follow-up after recent hospitalization.  She presented with 24 hours of headache and nausea/vomiting consistent with shunt failure symptoms with CT head demonstrating diffuse ventriculomegaly. Patient was initially temporized with shunt taps prior to undergoing placement of ventriculopleural shunt on 12/24 with Dr. Nancye.   Today, she is accompanied by her husband.  They report that she was initially feeling better, but has started feeling wobbly again in this week.  She sways sideways while walking.  She also reports double vision, but is actually better compared to prior, according to her.  She also reports lack of appetite and has not been eating much according to her husband.  She reports acid reflux with protein supplement.  Her BP is low-normal despite her taking losartan  50 mg instead of 75 mg.  Denies any chest pain or dyspnea currently.  Past Medical History:  Diagnosis Date   Allergy    Anxiety    Arnold-Chiari malformation (HCC)    Arthritis    Borderline diabetic    Chronic headaches    Colon polyps    nonadenomatous   Depression    Diabetes mellitus without complication (HCC)    Diverticulosis    Esophageal stricture    FROZEN LEFT SHOULDER 04/01/2010   Qualifier: Diagnosis of  By: Margrette MD, Taft     GERD (gastroesophageal reflux disease)    HTN (hypertension)    Hyperlipidemia    IBS (irritable bowel syndrome)    Seizures (HCC)    pt not sure when her last seizure was- she says she just spaces out- no  shaking but she could not tell me last seizure-  on O[place]ellar bid for seizures    Sleep apnea    no cpap now- did use 02 with cpap but has not used either in years    Past Surgical History:  Procedure Laterality Date   BREAST BIOPSY Left    cahri decompression  8 and 03/1999   CHOLECYSTECTOMY     COLONOSCOPY     invasive cervical traction  02/2009   left breast-lumpectomy     POLYPECTOMY     shunt put in on right side of brain     SHUNT REPLACEMENT  08/2009   SHUNT REPLACEMENT  07/2024   TOTAL ABDOMINAL HYSTERECTOMY  Age 34 yrs   TAH & BSO   TUBAL LIGATION     UPPER GASTROINTESTINAL ENDOSCOPY      Family History  Problem Relation Age of Onset   Colon polyps Mother    Breast cancer Mother    Lung cancer Mother    Heart disease Father    Stroke Father    Aortic aneurysm Father    Chiari malformation Daughter    Addison's disease Son    Breast cancer Maternal Aunt    Colon cancer Neg Hx    Esophageal cancer Neg Hx    Rectal cancer Neg Hx    Stomach cancer Neg Hx     Social History   Socioeconomic History   Marital status:  Married    Spouse name: Not on file   Number of children: 3   Years of education: Not on file   Highest education level: Some college, no degree  Occupational History   Not on file  Tobacco Use   Smoking status: Former    Current packs/day: 0.00    Types: Cigarettes    Quit date: 09/13/1990    Years since quitting: 34.0    Passive exposure: Never   Smokeless tobacco: Never  Vaping Use   Vaping status: Never Used  Substance and Sexual Activity   Alcohol use: No   Drug use: No   Sexual activity: Not Currently    Birth control/protection: Surgical    Comment: hyst  Other Topics Concern   Not on file  Social History Narrative   Disabled from Chiari malformation/seizures.. Drinks 4-5 cups of pepsi daily. Does not get regular exercise. Married for 40 years.   Lives with one pet, and husband.    Social Drivers of Health   Tobacco Use:  Medium Risk (09/24/2024)   Patient History    Smoking Tobacco Use: Former    Smokeless Tobacco Use: Never    Passive Exposure: Never  Physicist, Medical Strain: Low Risk (03/19/2024)   Overall Financial Resource Strain (CARDIA)    Difficulty of Paying Living Expenses: Not hard at all  Recent Concern: Financial Resource Strain - Medium Risk (02/20/2024)   Overall Financial Resource Strain (CARDIA)    Difficulty of Paying Living Expenses: Somewhat hard  Food Insecurity: Low Risk (06/19/2024)   Received from [facility]   Epic    Within the past 12 months, you worried that your food would run out before you got money to buy more: Never true    Within the past 12 months, the food you bought just didn't last and you didn't have money to get more. : Never true  Transportation Needs: No Transportation Needs (06/19/2024)   Received from [facility]    In the past 12 months, has lack of reliable transportation kept you from medical appointments, meetings, work or from getting things needed for daily living? : No  Physical Activity: Insufficiently Active (03/19/2024)   Exercise Vital Sign    Days of Exercise per Week: 7 days    Minutes of Exercise per Session: 20 min  Stress: No Stress Concern Present (03/19/2024)   [employer] of Occupational Health - Occupational Stress Questionnaire    Feeling of Stress: Not at all  Social Connections: Moderately Integrated (03/19/2024)   Social Connection and Isolation Panel    Frequency of Communication with Friends and Family: Three times a week    Frequency of Social Gatherings with Friends and Family: More than three times a week    Attends Religious Services: More than 4 times per year    Active Member of [employer] or Organizations: No    Attends Banker Meetings: Never    Marital Status: Married  Recent Concern: Social Connections - Moderately Isolated (02/20/2024)   Social Connection and Isolation Panel    Frequency of  Communication with Friends and Family: More than three times a week    Frequency of Social Gatherings with Friends and Family: Twice a week    Attends Religious Services: Never    Database Administrator or Organizations: No    Attends Banker Meetings: Never    Marital Status: Married  Catering Manager Violence: Not At Risk (04/30/2024)   Epic  Fear of Current or Ex-Partner: No    Emotionally Abused: No    Physically Abused: No    Sexually Abused: No  Depression (PHQ2-9): Low Risk (09/24/2024)   Depression (PHQ2-9)    PHQ-2 Score: 0  Alcohol Screen: Low Risk (03/19/2024)   Alcohol Screen    Last Alcohol Screening Score (AUDIT): 0  Housing: Low Risk (06/19/2024)   Received from [facility]   Epic    What is your living situation today?: I have a steady place to live    Think about the place you live. Do you have problems with any of the following? Choose all that apply:: None/None on this list  Utilities: Low Risk (06/19/2024)   Received from [facility]   Utilities    In the past 12 months has the electric, gas, oil, or water company threatened to shut off services in your home? : No  Health Literacy: Adequate Health Literacy (03/19/2024)   B1300 Health Literacy    Frequency of need for help with medical instructions: Never    Outpatient Medications Prior to Visit  Medication Sig Dispense Refill   ACCU-CHEK GUIDE TEST test strip USE TO TEST BLOOD GLUCOSE THREE TIMES DAILY 100 strip 3   Accu-Chek Softclix Lancets lancets 1 each by Other route daily. as directed 100 each 3   AIMOVIG  140 MG/ML SOAJ Inject 140 mg into the skin every 30 (thirty) days. 3 mL 3   aspirin EC 81 MG tablet Take 2 tablets by mouth daily.     atorvastatin  (LIPITOR) 20 MG tablet Take 1 tablet (20 mg total) by mouth daily. 90 tablet 3   cetirizine  (ZYRTEC ) 10 MG tablet TAKE 1 TABLET EVERY DAY 90 tablet 0   gabapentin  (NEURONTIN ) 300 MG capsule Take 2 capsules (600 mg total) by mouth at bedtime.  180 capsule 3   glucose blood test strip Use to test blood glucose three times daily. 100 each 2   losartan  (COZAAR ) 50 MG tablet Take 1.5 tablets (75 mg total) by mouth daily. (Patient taking differently: Take 50 mg by mouth daily.) 135 tablet 1   meclizine  (ANTIVERT ) 25 MG tablet Take 1 tablet (25 mg total) by mouth 2 (two) times daily as needed for dizziness. 30 tablet 0   Oxcarbazepine  (TRILEPTAL ) 300 MG tablet Take 1 tablet (300 mg total) by mouth 2 (two) times daily. 180 tablet 3   pantoprazole  (PROTONIX ) 40 MG tablet Take 1 tablet (40 mg total) by mouth daily. 90 tablet 2   propranolol  (INDERAL ) 60 MG tablet Take 1 tablet (60 mg total) by mouth in the morning. 90 tablet 1   propranolol  (INDERAL ) 80 MG tablet TAKE 1 TABLET NIGHTLY TO BE TAKEN IN ADDITION TO 60 MG EVERY MORNING. 90 tablet 3   rizatriptan  (MAXALT -MLT) 5 MG disintegrating tablet Take 1 tablet (5 mg total) by mouth as needed. May repeat in 2 hours if needed 10 tablet 6   sennosides-docusate sodium  (SENOKOT-S) 8.6-50 MG tablet Take 1 tablet by mouth daily. 30 tablet 2   UNABLE TO FIND Aleve  pain relieving lotion-daily     VITAMIN D  PO Take 10,000 Units by mouth daily.     Semaglutide , 1 MG/DOSE, (OZEMPIC , 1 MG/DOSE,) 4 MG/3ML SOPN Inject 1 mg into the skin once a week. 3 mL 3   No facility-administered medications prior to visit.    Allergies[1]  ROS Review of Systems  Constitutional:  Positive for fatigue. Negative for chills and fever.  HENT:  Negative for congestion, sinus  pressure and sinus pain.   Eyes:  Negative for pain and discharge.  Respiratory:  Positive for shortness of breath (Intermittent). Negative for cough.   Cardiovascular:  Negative for chest pain and palpitations.  Gastrointestinal:  Positive for constipation. Negative for diarrhea, nausea and vomiting.  Genitourinary:  Negative for frequency and hematuria.  Musculoskeletal:  Positive for arthralgias, back pain and gait problem. Negative for neck pain  and neck stiffness.  Skin:  Negative for rash.  Neurological:  Positive for dizziness, weakness and headaches (Chronic).  Psychiatric/Behavioral:  Negative for agitation and behavioral problems.       Objective:    Physical Exam Vitals reviewed.  Constitutional:      General: She is not in acute distress.    Appearance: She is obese. She is not diaphoretic.  HENT:     Head: Normocephalic and atraumatic.     Nose: Nose normal.     Mouth/Throat:     Mouth: Mucous membranes are moist.  Eyes:     General: No scleral icterus.    Extraocular Movements: Extraocular movements intact.  Cardiovascular:     Rate and Rhythm: Normal rate and regular rhythm.     Heart sounds: Normal heart sounds. No murmur heard. Pulmonary:     Breath sounds: Normal breath sounds. No wheezing or rales.  Musculoskeletal:     Cervical back: Neck supple. No tenderness.     Right lower leg: No edema.     Left lower leg: No edema.  Skin:    General: Skin is warm.     Findings: No rash.  Neurological:     General: No focal deficit present.     Mental Status: She is alert and oriented to person, place, and time.     Sensory: Sensory deficit (b/l LE) present.     Motor: Weakness (RUE - 4/5, b/l LE - 4/5) present.  Psychiatric:        Mood and Affect: Mood normal.        Behavior: Behavior normal.     BP 104/74   Pulse 66   Ht 5' 2 (1.575 m)   Wt 153 lb 9.6 oz (69.7 kg)   SpO2 96%   BMI 28.09 kg/m  Wt Readings from Last 3 Encounters:  09/24/24 153 lb 9.6 oz (69.7 kg)  08/31/24 163 lb 6.4 oz (74.1 kg)  08/16/24 163 lb (73.9 kg)    Lab Results  Component Value Date   TSH 0.598 02/29/2024   Lab Results  Component Value Date   WBC 7.1 02/29/2024   HGB 14.4 02/29/2024   HCT 41.2 02/29/2024   MCV 93 02/29/2024   PLT 217 02/29/2024   Lab Results  Component Value Date   NA 139 02/27/2024   K 3.6 02/27/2024   CO2 22 02/27/2024   GLUCOSE 117 (H) 02/27/2024   BUN 6 (L) 02/27/2024    CREATININE 0.88 02/27/2024   BILITOT 0.4 10/31/2023   ALKPHOS 151 (H) 10/31/2023   AST 21 10/31/2023   ALT 24 10/31/2023   PROT 7.1 10/31/2023   ALBUMIN 4.8 10/31/2023   CALCIUM  9.6 02/27/2024   ANIONGAP 8 03/25/2021   EGFR 75 02/27/2024   Lab Results  Component Value Date   CHOL 155 02/29/2024   Lab Results  Component Value Date   HDL 55 02/29/2024   Lab Results  Component Value Date   LDLCALC 80 02/29/2024   Lab Results  Component Value Date   TRIG 112 02/29/2024  Lab Results  Component Value Date   CHOLHDL 2.8 02/29/2024   Lab Results  Component Value Date   HGBA1C 5.7 07/02/2024      Assessment & Plan:   Problem List Items Addressed This Visit       Cardiovascular and Mediastinum   Essential hypertension   BP: 104/74   Well-controlled with  Losartan  50 mg QD and Propranolol  80 mg + 60 mg QD now Had increased dose to 75 mg in the last visit, but considering her BP today, would prefer to continue 50 mg QD Amlodipine  was recently discontinued due to leg swelling by Cardiology Counseled for compliance with the medications Advised DASH diet and moderate exercise/walking, at least 150 mins/week        Digestive   Gastroesophageal reflux disease   Nausea and epigastric pain could be due to starving/acute gastritis Needs to follow small, frequent meals Zofran  as needed for nausea Added Pepcid  for acid reflux      Relevant Medications   famotidine  (PEPCID ) 40 MG tablet     Endocrine   DM type 2 with diabetic peripheral neuropathy (HCC)   Lab Results  Component Value Date   HGBA1C 5.7 07/02/2024   Associated with HTN and HLD Well-controlled, glycemic profile improved since starting Ozempic  in 09/24 - agrees to follow low carb diet and small, frequent meals to avoid nausea; Zofran  PRN for nausea Reduced dose of Ozempic  to 0.5 mg QW for now due to lack of appetite although it is likely due to hydrocephalus Advised to follow diabetic diet On  statin F/u CMP and lipid panel Diabetic eye exam: Advised to follow up with Ophthalmology for diabetic eye exam  On gabapentin  600 mg qHS for neuropathy      Relevant Medications   Semaglutide ,0.25 or 0.5MG /DOS, (OZEMPIC , 0.25 OR 0.5 MG/DOSE,) 2 MG/3ML SOPN     Nervous and Auditory   Hydrocephalus (HCC) - Primary   Has history of Arnold-Chiari malformation VP shunt for hydrocephalus, but had recent increased size of ventricles noted on CT of head - had reprogramming on 10/25/22, and had revision shunt placement in 04/24, 10/25, 11/25 and 12/25 as she has recurrent hydrocephalus Followed by neurosurgeon - gait disturbance likely due to hydrocephalus, advised to contact neurosurgeon        Other   Hospital discharge follow-up   Hospital chart reviewed, including discharge summary Medications reconciled and reviewed with the patient in detail       Meds ordered this encounter  Medications   Semaglutide ,0.25 or 0.5MG /DOS, (OZEMPIC , 0.25 OR 0.5 MG/DOSE,) 2 MG/3ML SOPN    Sig: Inject 0.5 mg into the skin every 7 (seven) days.    Dispense:  3 mL    Refill:  3   famotidine  (PEPCID ) 40 MG tablet    Sig: Take 1 tablet (40 mg total) by mouth daily.    Dispense:  30 tablet    Refill:  5    Follow-up: Return if symptoms worsen or fail to improve.    Suzzane MARLA Blanch, MD     [1]  Allergies Allergen Reactions   Cymbalta [Duloxetine Hcl] Itching    Nasal itching   Methocarbamol     Propoxyphene N-Acetaminophen Itching   Sulfa Antibiotics    "

## 2024-09-24 NOTE — Assessment & Plan Note (Signed)
 Hospital chart reviewed, including discharge summary Medications reconciled and reviewed with the patient in detail

## 2024-09-30 NOTE — Progress Notes (Signed)
 Case Management Adult Assessment  CSN: [id number] DOB: April 13, 1961 Service: Neurosurgery Location: [facility]   Info & Contacts Assessment Completed: In-person interview with Patient, In-person interview with (comment) (spouse at bedside) The patient's status at this time is:: Able to communicate Prior to admission, patient resided at: Private residence Prior to admission, patient lived with : Spouse/Significant Other The patient's decision maker is:: Patient At discharge, will patient return to prior residence: Yes Barriers to education: No barriers   Extended Emergency Contact Information Primary Emergency Contact: Fannin,Ricky Mobile Phone: [phone] Relation: Spouse Secondary Emergency Contact: Laws,Theresa  [place] Mobile Phone: [phone] Relation: Child  Assessment Is this patient at baseline?: No Was patient independent with ADLs prior to admission?: Yes Was patient independent with mobility prior to admission?: Yes Does this patient have or need any DME?: No, Yes Patient has the following DME: Walker (has rolling walker and Rollator as well as Diabetic supplies) Does this patient have or need any home health services?: Yes, Agency Name (comment) ([facility]) Patient has the following home health services: PT, Skilled nurse Does this patient have or need any personal care services?: No   Social Does patient have a mental health diagnosis?: No Does patient have an acute substance use issue?: No   Discharge How will patient obtain prescription meds at discharge:: Medicare Part D Type of Payer Source:: Medicare How will this patient obtain follow-up care after discharge?: PCP office How will this patient reach the discharge destination?: Family/Friends Is this a Chronic Dialysis patient?: No Patient Discharge Goals of Care: Increased Mobility/Self Care, Safety  Discharge Education Discharge Plan Discussed With:: Patient Education Readiness::  Acceptance Education Method:: Explanation Education Response:: Verbalizes Understanding   Patient health Questionnaire (PHQ) Will the patient answer the depression risk questions?: No    SW did speak with the pt and spouse at the bedside.  The pt indicates that she is independent prior.  The pt has a RW and a rollator.  The pt confirms she has diabetic supplies.  The pt was active with [facility].  The pt goal is to return home with her spouse and HHC to resume with [facility].  SW did confirm there are resumption orders in place for [facility].                Case Management Coordination Status: Coordination Complete    Anticipated Discharge Location: Home with Home Health  If Plan A discharging location is not feasible: Potential Plan B: Home with Home Health         Silvano KATHEE Gosling, MSW

## 2024-09-30 NOTE — Discharge Summary (Signed)
 NEUROSURGERY DISCHARGE SUMMARY  Patient ID: Amanda Webster [facility] [id number] 64 y.o. Aug 18, 1961  Admit date: 09/27/2024 Discharge date: Sun 09/30/2024 Physician: Garnette Bouquet, MD Diagnoses:Shunt malfunction [phone] Shunt malfunction, initial encounter [T85.618A]  Procedures/Surgeries performed during hospitalization: Shunt reprogramming (1/15)  Hospital Course:  Amanda Webster is a 64 year old female with a history of Chiari malformation s/p decompression, complicated by hydrocephalus requiring (VP) shunt placement and multiple subsequent shunt revisions, who presented with acute neurologic changes following a recent shunt setting adjustment from her baseline 4 to a setting of 5 due to symptoms of overdrainage.  She was admitted for evaluation of worsening confusion/lethargy, urinary incontinence, dizziness, and inability to ambulate without significant assistance. On examination, she was arousable to verbal stimuli, oriented, but unable to stand without significant assistance; her neurological exam was otherwise non-focal. Imaging with CT head demonstrated diffuse ventriculomegaly, consistent with hydrocephalus suspected to be due to shunt malfunction or underdrainage.   Her shunt was changed from a setting of 5 back to a setting of 4, and patient closely monitored. Although some waxing/waning of symptoms occurred, patient clinically improved significantly following these setting adjustments. On the day of discharge, patient alert, oriented, interactive, able to ambulate to bathroom with assistance and stand to use bedside commode on multiple occasions. No further episodes of urinary incontinence. Patient uses walker at home at baseline, and patient noted to be very near, if not at, her baseline. Patient and family at bedside agreeable to plan of discharge.   Significant Diagnostic Studies:  CT imaging XR imaging to evaluate shunt  Significant Diagnoses Managed during  Hospitalization: -  Hydrocephalus - GERD: treat with home PPI and/or PRN antacid medications HLD: treat with home lipid-lowering therapeutics HTN: SBP <180, treat with home antihypertensive agents, PRN agents as needed  Labs: Lab Results  Component Value Date   WBC 7.10 09/27/2024   HGB 13.8 09/27/2024   HCT 39.0 09/27/2024   MCV 87.9 09/27/2024   PLT 264 09/27/2024    Discharge Exam: Vital Signs:  Vitals:   09/30/24 1515  BP: 145/70  Pulse: 67  Resp: 16  Temp: 97.9 F (36.6 C)  SpO2: 99%   Exam: 64 y.o. female, lying in bed Alert, responsive to questions. O x 3 Tracks examiner PERRL Midline gaze Face symmetric Follows commands in BUEs and BLEs with at least 4/5 strength  Discharge Medication List:   Medication List     ASK your doctor about these medications    Accu-Chek Aviva Plus test strp test strip Generic drug: glucose blood 1 each by Other route 3 (three) times a day.   Accu-Chek Softclix Lancets Misc Generic drug: Lancets 1 each by Other route 3 (three) times a day.   Aimovig  Autoinjector 140 mg/mL Atin Generic drug: erenumab -aooe Inject 140 mg under the skin every 30 (thirty) days.   atorvastatin  20 mg tablet Commonly known as: LIPITOR Take 20 mg by mouth nightly.   calcium  carbonate 500 mg (200 mg calcium ) chewable tablet Commonly known as: TUMS Chew 2 tablets 3 (three) times a day as needed for heartburn.   camphor-menthoL 4-10 % Crea Apply 1 Application topically as needed in the morning and 1 Application as needed at noon and 1 Application as needed in the evening and 1 Application as needed before bedtime.   carboxymethylcellulose 0.5 % Dpet Commonly known as: REFRESH PLUS Administer 1 drop into both eyes 3 (three) times a day as needed (dry eye).   cyclobenzaprine 5 mg tablet Commonly known as: FLEXERIL Take  1 tablet (5 mg total) by mouth 3 (three) times a day as needed for muscle spasms.   gabapentin  300 mg capsule Commonly  known as: NEURONTIN  Take 600 mg by mouth nightly.   losartan  50 mg tablet Commonly known as: COZAAR  Take 50 mg by mouth daily.   meclizine  25 mg tablet Commonly known as: ANTIVERT  Take 25 mg by mouth 2 (two) times a day as needed for dizziness.   ondansetron  4 mg disintegrating tablet Commonly known as: ZOFRAN -ODT Dissolve 1 tablet (4 mg total) on tongue every 8 (eight) hours as needed for nausea.   OXcarbazepine  300 mg tablet Commonly known as: TRILEPTAL  Take 300 mg by mouth 2 (two) times a day.   Ozempic  0.25 mg or 0.5 mg (2 mg/3 mL) subcutaneous pen injector Generic drug: semaglutide  Inject 0.5 mg under the skin every 7 days.   pantoprazole  40 mg EC tablet Commonly known as: PROTONIX  Take 40 mg by mouth daily.   polyethylene glycol 17 gram Powd powder Commonly known as: MIRALAX  Take 17 g by mouth daily as needed.   * propranoloL  60 mg tablet Commonly known as: INDERAL  Take 60 mg by mouth every morning.   * propranoloL  80 mg tablet Commonly known as: INDERAL  Take 80 mg by mouth nightly.   rizatriptan  MLT 5 mg disintegrating tablet Commonly known as: MAXALT -MLT Dissolve 5 mg on tongue as needed for migraine. may repeat in 2 hours if needed      * * This list has 2 medication(s) that are the same as other medications prescribed for you. Read the directions carefully, and ask your doctor or other care provider to review them with you.           Disposition:  home  Patient Instructions:  Please follow up as scheduled w/ Dr. Nancye  See After Visit Summary for procedure specific home care instructions. Call for fever greater than 101.5, redness, swelling or drainage at your incision site, new or worsening pain, loss of function, new numbness or tingling. Call for any other question or concern.  The number to call is [phone].  Do not bend, lift, drive, move furniture or perform strenuous activity cleared at your follow up appointment. Never combine  controlled substance medications with alcohol.  Never drive while taking controlled substance medications.    Follow-up with: An appointment with Dr. Nancye in 2-4 weeks has been requested for you. You will be notified of the date and time of this appointment. If you do not receive an appointment please call [phone].  Your scheduled appointments are listed below. If you have any questions please call [phone].  Scheduled Future Appointments       Provider Department Dept Phone Center   11/01/2024 9:30 AM [facility] CT1 [facility]  - [place] [facility] [place] FONDER [phone] [facility] Out[facility]   11/01/2024 11:00 AM Morene Alm Setters [facility] [place] [address] Neurosurgery [phone] [facility] [place]   01/24/2025 11:15 AM [facility] CT20 [facility] - RADIOLOGY CT [phone] [facility]   01/24/2025 1:00 PM Garnette Nancye [facility] [place] 95 Neurosurgery [phone] [facility] Comp [facility]       Electronically signed by:  Donnice Bernardino Beets, MD 09/30/2024 3:38 PM *Some images could not be shown.

## 2024-10-01 ENCOUNTER — Other Ambulatory Visit: Payer: Self-pay

## 2024-10-01 ENCOUNTER — Other Ambulatory Visit (HOSPITAL_COMMUNITY): Payer: Self-pay

## 2024-10-01 ENCOUNTER — Telehealth: Payer: Self-pay

## 2024-10-01 NOTE — Transitions of Care (Post Inpatient/ED Visit) (Signed)
" ° °  10/01/2024  Name: Amanda Webster MRN: [id number] DOB: [birth year]  Today's TOC FU Call Status: Today's TOC FU Call Status:: Unsuccessful Call (1st Attempt) Unsuccessful Call (1st Attempt) Date: 10/01/24  Attempted to reach the patient regarding the most recent Inpatient/ED visit.  Follow Up Plan: Additional outreach attempts will be made to reach the patient to complete the Transitions of Care (Post Inpatient/ED visit) call.    Bing Edison MSN, RN RN [facility] Sales [facility]  [facility] Office Hours M-F [phone] Direct Dial: [phone] Main Phone [phone]  Fax: [phone] .com    "

## 2024-10-02 ENCOUNTER — Telehealth: Payer: Self-pay

## 2024-10-02 NOTE — Transitions of Care (Post Inpatient/ED Visit) (Signed)
" ° °  10/02/2024  Name: Amanda Webster MRN: [id number] DOB: 1960-12-22  Today's TOC FU Call Status: Today's TOC FU Call Status:: Unsuccessful Call (2nd Attempt) Unsuccessful Call (2nd Attempt) Date: 10/02/24  Attempted to reach the patient regarding the most recent Inpatient/ED visit.  Follow Up Plan: Additional outreach attempts will be made to reach the patient to complete the Transitions of Care (Post Inpatient/ED visit) call.   Shona Prow RN, CCM [place]  [facility]Population Health RN Care Manager [phone]  "

## 2024-10-03 ENCOUNTER — Telehealth: Payer: Self-pay | Admitting: Neurology

## 2024-10-03 NOTE — Telephone Encounter (Signed)
 [facility]) request verbal order for physical therapy. Frequency:  1xwk for 1 wk 2x wk for 2 wks  Requesting occupational therapy evaluation

## 2024-10-04 ENCOUNTER — Other Ambulatory Visit: Payer: Self-pay

## 2024-10-04 NOTE — Telephone Encounter (Signed)
 I called and spoke with Glade and okayed the below.

## 2024-10-08 ENCOUNTER — Ambulatory Visit: Payer: Self-pay | Admitting: Internal Medicine

## 2024-10-11 NOTE — Telephone Encounter (Signed)
 [facility] PT notes placed on Dr.Yan desk to be signed and faxed back for order #[id number].

## 2024-10-12 ENCOUNTER — Telehealth: Payer: Self-pay

## 2024-10-12 NOTE — Telephone Encounter (Signed)
Called back to approve orders  

## 2024-10-12 NOTE — Telephone Encounter (Signed)
 Copied from CRM [id number]. Topic: Clinical - Home Health Verbal Orders >> Oct 12, 2024 12:26 PM Mia F wrote: Caller/Agency: Malorie from [facility] Callback Number: [phone] Service Requested: Occupational Therapy Frequency: Moving eval to next week due to weather (initial eval)   Any new concerns about the patient? No

## 2024-10-18 NOTE — Telephone Encounter (Signed)
 Paper signed and faxed back to [facility], confirmation received.

## 2024-11-20 ENCOUNTER — Ambulatory Visit: Payer: Self-pay | Admitting: Internal Medicine

## 2025-02-20 ENCOUNTER — Ambulatory Visit

## 2025-03-04 ENCOUNTER — Ambulatory Visit: Admitting: Adult Health
